# Patient Record
Sex: Female | Born: 1955 | Race: Black or African American | Hispanic: No | Marital: Single | Smoking: Never smoker
Health system: Southern US, Community
[De-identification: ages and names within clinical notes are randomized; demographics above are authoritative.]

## PROBLEM LIST (undated history)

## (undated) DIAGNOSIS — J309 Allergic rhinitis, unspecified: Secondary | ICD-10-CM

## (undated) DIAGNOSIS — M25531 Pain in right wrist: Secondary | ICD-10-CM

## (undated) DIAGNOSIS — J45909 Unspecified asthma, uncomplicated: Secondary | ICD-10-CM

## (undated) DIAGNOSIS — G5600 Carpal tunnel syndrome, unspecified upper limb: Secondary | ICD-10-CM

## (undated) DIAGNOSIS — E039 Hypothyroidism, unspecified: Secondary | ICD-10-CM

## (undated) DIAGNOSIS — M6740 Ganglion, unspecified site: Secondary | ICD-10-CM

## (undated) DIAGNOSIS — R928 Other abnormal and inconclusive findings on diagnostic imaging of breast: Secondary | ICD-10-CM

## (undated) DIAGNOSIS — M79602 Pain in left arm: Secondary | ICD-10-CM

## (undated) HISTORY — PX: ABDOMINAL HYSTERECTOMY: SHX81

## (undated) HISTORY — DX: Allergic rhinitis, unspecified: J30.9

## (undated) HISTORY — DX: Carpal tunnel syndrome, unspecified upper limb: G56.00

## (undated) HISTORY — DX: Other abnormal and inconclusive findings on diagnostic imaging of breast: R92.8

## (undated) HISTORY — DX: Ganglion, unspecified site: M67.40

## (undated) HISTORY — DX: Pain in left arm: M79.602

## (undated) HISTORY — DX: Pain in right wrist: M25.531

---

## 1997-08-13 ENCOUNTER — Encounter: Admission: RE | Admit: 1997-08-13 | Discharge: 1997-08-13 | Payer: Self-pay | Admitting: Obstetrics

## 1997-08-27 ENCOUNTER — Other Ambulatory Visit: Admission: RE | Admit: 1997-08-27 | Discharge: 1997-08-27 | Payer: Self-pay | Admitting: Obstetrics

## 1997-08-27 ENCOUNTER — Encounter: Admission: RE | Admit: 1997-08-27 | Discharge: 1997-08-27 | Payer: Self-pay | Admitting: Obstetrics

## 1997-12-24 ENCOUNTER — Encounter: Admission: RE | Admit: 1997-12-24 | Discharge: 1997-12-24 | Payer: Self-pay | Admitting: Family Medicine

## 1998-05-07 ENCOUNTER — Encounter: Admission: RE | Admit: 1998-05-07 | Discharge: 1998-05-07 | Payer: Self-pay | Admitting: Family Medicine

## 1998-10-12 ENCOUNTER — Encounter: Admission: RE | Admit: 1998-10-12 | Discharge: 1998-10-12 | Payer: Self-pay | Admitting: Sports Medicine

## 1998-10-26 ENCOUNTER — Encounter: Admission: RE | Admit: 1998-10-26 | Discharge: 1998-10-26 | Payer: Self-pay | Admitting: Sports Medicine

## 1998-12-30 ENCOUNTER — Encounter: Admission: RE | Admit: 1998-12-30 | Discharge: 1998-12-30 | Payer: Self-pay | Admitting: Obstetrics

## 1999-02-01 ENCOUNTER — Encounter: Admission: RE | Admit: 1999-02-01 | Discharge: 1999-02-01 | Payer: Self-pay | Admitting: Sports Medicine

## 1999-02-01 ENCOUNTER — Encounter: Payer: Self-pay | Admitting: Sports Medicine

## 1999-03-23 ENCOUNTER — Encounter: Admission: RE | Admit: 1999-03-23 | Discharge: 1999-03-23 | Payer: Self-pay | Admitting: Family Medicine

## 1999-06-02 ENCOUNTER — Encounter: Admission: RE | Admit: 1999-06-02 | Discharge: 1999-06-02 | Payer: Self-pay | Admitting: Family Medicine

## 1999-07-12 ENCOUNTER — Encounter: Admission: RE | Admit: 1999-07-12 | Discharge: 1999-07-12 | Payer: Self-pay | Admitting: Family Medicine

## 1999-08-10 ENCOUNTER — Encounter: Admission: RE | Admit: 1999-08-10 | Discharge: 1999-08-10 | Payer: Self-pay | Admitting: Family Medicine

## 1999-08-20 ENCOUNTER — Emergency Department (HOSPITAL_COMMUNITY): Admission: EM | Admit: 1999-08-20 | Discharge: 1999-08-20 | Payer: Self-pay | Admitting: Emergency Medicine

## 1999-09-02 ENCOUNTER — Encounter: Admission: RE | Admit: 1999-09-02 | Discharge: 1999-09-02 | Payer: Self-pay | Admitting: Family Medicine

## 1999-11-08 ENCOUNTER — Encounter: Admission: RE | Admit: 1999-11-08 | Discharge: 1999-11-08 | Payer: Self-pay | Admitting: Family Medicine

## 1999-12-13 ENCOUNTER — Encounter: Admission: RE | Admit: 1999-12-13 | Discharge: 1999-12-13 | Payer: Self-pay | Admitting: Family Medicine

## 2000-01-12 ENCOUNTER — Encounter: Admission: RE | Admit: 2000-01-12 | Discharge: 2000-01-12 | Payer: Self-pay | Admitting: Family Medicine

## 2000-01-24 ENCOUNTER — Encounter: Admission: RE | Admit: 2000-01-24 | Discharge: 2000-01-24 | Payer: Self-pay | Admitting: Family Medicine

## 2000-05-22 ENCOUNTER — Emergency Department (HOSPITAL_COMMUNITY): Admission: EM | Admit: 2000-05-22 | Discharge: 2000-05-22 | Payer: Self-pay | Admitting: Emergency Medicine

## 2000-06-11 ENCOUNTER — Encounter: Admission: RE | Admit: 2000-06-11 | Discharge: 2000-06-11 | Payer: Self-pay | Admitting: Family Medicine

## 2000-08-13 ENCOUNTER — Encounter: Admission: RE | Admit: 2000-08-13 | Discharge: 2000-08-13 | Payer: Self-pay | Admitting: Family Medicine

## 2002-01-17 ENCOUNTER — Encounter: Admission: RE | Admit: 2002-01-17 | Discharge: 2002-01-17 | Payer: Self-pay | Admitting: Family Medicine

## 2002-06-25 ENCOUNTER — Emergency Department (HOSPITAL_COMMUNITY): Admission: EM | Admit: 2002-06-25 | Discharge: 2002-06-25 | Payer: Self-pay | Admitting: Emergency Medicine

## 2003-02-12 ENCOUNTER — Encounter: Admission: RE | Admit: 2003-02-12 | Discharge: 2003-02-12 | Payer: Self-pay | Admitting: Family Medicine

## 2003-03-19 ENCOUNTER — Encounter: Admission: RE | Admit: 2003-03-19 | Discharge: 2003-03-19 | Payer: Self-pay | Admitting: Family Medicine

## 2003-06-19 ENCOUNTER — Emergency Department (HOSPITAL_COMMUNITY): Admission: EM | Admit: 2003-06-19 | Discharge: 2003-06-19 | Payer: Self-pay | Admitting: Emergency Medicine

## 2003-06-22 ENCOUNTER — Encounter: Admission: RE | Admit: 2003-06-22 | Discharge: 2003-06-22 | Payer: Self-pay | Admitting: Sports Medicine

## 2003-07-09 ENCOUNTER — Encounter: Admission: RE | Admit: 2003-07-09 | Discharge: 2003-07-09 | Payer: Self-pay | Admitting: Family Medicine

## 2003-07-29 ENCOUNTER — Encounter: Admission: RE | Admit: 2003-07-29 | Discharge: 2003-07-29 | Payer: Self-pay | Admitting: Sports Medicine

## 2003-07-29 ENCOUNTER — Encounter (INDEPENDENT_AMBULATORY_CARE_PROVIDER_SITE_OTHER): Payer: Self-pay | Admitting: *Deleted

## 2003-07-31 ENCOUNTER — Encounter: Admission: RE | Admit: 2003-07-31 | Discharge: 2003-07-31 | Payer: Self-pay | Admitting: Family Medicine

## 2003-08-19 ENCOUNTER — Encounter: Admission: RE | Admit: 2003-08-19 | Discharge: 2003-08-19 | Payer: Self-pay | Admitting: Family Medicine

## 2003-09-02 ENCOUNTER — Encounter: Admission: RE | Admit: 2003-09-02 | Discharge: 2003-09-02 | Payer: Self-pay | Admitting: Sports Medicine

## 2004-04-13 ENCOUNTER — Ambulatory Visit: Payer: Self-pay | Admitting: Family Medicine

## 2004-11-17 ENCOUNTER — Ambulatory Visit: Payer: Self-pay | Admitting: Family Medicine

## 2005-01-25 ENCOUNTER — Ambulatory Visit: Payer: Self-pay | Admitting: Family Medicine

## 2005-02-06 ENCOUNTER — Encounter: Admission: RE | Admit: 2005-02-06 | Discharge: 2005-02-06 | Payer: Self-pay | Admitting: Sports Medicine

## 2005-02-06 ENCOUNTER — Ambulatory Visit: Payer: Self-pay | Admitting: Family Medicine

## 2005-06-02 ENCOUNTER — Ambulatory Visit: Payer: Self-pay | Admitting: Family Medicine

## 2005-06-02 ENCOUNTER — Ambulatory Visit (HOSPITAL_COMMUNITY): Admission: RE | Admit: 2005-06-02 | Discharge: 2005-06-02 | Payer: Self-pay | Admitting: Family Medicine

## 2006-04-04 ENCOUNTER — Encounter (INDEPENDENT_AMBULATORY_CARE_PROVIDER_SITE_OTHER): Payer: Self-pay | Admitting: *Deleted

## 2006-04-04 ENCOUNTER — Ambulatory Visit: Payer: Self-pay | Admitting: Family Medicine

## 2006-04-04 LAB — CONVERTED CEMR LAB
ALT: 16 units/L (ref 0–35)
CO2: 25 meq/L (ref 19–32)
Calcium: 9.2 mg/dL (ref 8.4–10.5)
Cholesterol: 212 mg/dL — ABNORMAL HIGH (ref 0–200)
Creatinine, Ser: 0.83 mg/dL (ref 0.40–1.20)
Glucose, Bld: 81 mg/dL (ref 70–99)
LDL Cholesterol: 147 mg/dL — ABNORMAL HIGH (ref 0–99)
Sodium: 139 meq/L (ref 135–145)
TSH: 1.12 microintl units/mL (ref 0.350–5.50)
Total CHOL/HDL Ratio: 4.7
Total Protein: 7.5 g/dL (ref 6.0–8.3)
Triglycerides: 100 mg/dL (ref <150)
VLDL: 20 mg/dL (ref 0–40)

## 2006-04-26 DIAGNOSIS — I10 Essential (primary) hypertension: Secondary | ICD-10-CM | POA: Insufficient documentation

## 2006-04-26 DIAGNOSIS — G5600 Carpal tunnel syndrome, unspecified upper limb: Secondary | ICD-10-CM

## 2006-04-26 DIAGNOSIS — E039 Hypothyroidism, unspecified: Secondary | ICD-10-CM | POA: Insufficient documentation

## 2006-04-26 DIAGNOSIS — E0500 Thyrotoxicosis with diffuse goiter without thyrotoxic crisis or storm: Secondary | ICD-10-CM | POA: Insufficient documentation

## 2006-04-26 DIAGNOSIS — F339 Major depressive disorder, recurrent, unspecified: Secondary | ICD-10-CM

## 2006-04-26 DIAGNOSIS — E785 Hyperlipidemia, unspecified: Secondary | ICD-10-CM

## 2006-04-26 DIAGNOSIS — D259 Leiomyoma of uterus, unspecified: Secondary | ICD-10-CM | POA: Insufficient documentation

## 2006-04-26 DIAGNOSIS — Z6841 Body Mass Index (BMI) 40.0 and over, adult: Secondary | ICD-10-CM | POA: Insufficient documentation

## 2006-04-26 HISTORY — DX: Carpal tunnel syndrome, unspecified upper limb: G56.00

## 2006-04-27 ENCOUNTER — Encounter (INDEPENDENT_AMBULATORY_CARE_PROVIDER_SITE_OTHER): Payer: Self-pay | Admitting: *Deleted

## 2006-05-30 ENCOUNTER — Ambulatory Visit: Payer: Self-pay | Admitting: Family Medicine

## 2006-12-15 ENCOUNTER — Emergency Department (HOSPITAL_COMMUNITY): Admission: EM | Admit: 2006-12-15 | Discharge: 2006-12-15 | Payer: Self-pay | Admitting: Emergency Medicine

## 2007-01-15 ENCOUNTER — Telehealth: Payer: Self-pay | Admitting: *Deleted

## 2007-03-15 ENCOUNTER — Ambulatory Visit: Payer: Self-pay | Admitting: Family Medicine

## 2007-03-15 ENCOUNTER — Encounter (INDEPENDENT_AMBULATORY_CARE_PROVIDER_SITE_OTHER): Payer: Self-pay | Admitting: *Deleted

## 2007-03-19 ENCOUNTER — Encounter (INDEPENDENT_AMBULATORY_CARE_PROVIDER_SITE_OTHER): Payer: Self-pay | Admitting: *Deleted

## 2007-03-19 LAB — CONVERTED CEMR LAB
AST: 33 units/L (ref 0–37)
Calcium: 9.1 mg/dL (ref 8.4–10.5)
Chloride: 100 meq/L (ref 96–112)
Cholesterol: 236 mg/dL — ABNORMAL HIGH (ref 0–200)
Creatinine, Ser: 0.82 mg/dL (ref 0.40–1.20)
Glucose, Bld: 79 mg/dL (ref 70–99)
HDL: 47 mg/dL (ref >39)
Sodium: 139 meq/L (ref 135–145)
Total Bilirubin: 0.4 mg/dL (ref 0.3–1.2)
Total Protein: 7.5 g/dL (ref 6.0–8.3)
Triglycerides: 104 mg/dL (ref <150)
VLDL: 21 mg/dL (ref 0–40)

## 2008-03-25 ENCOUNTER — Ambulatory Visit: Payer: Self-pay | Admitting: Family Medicine

## 2008-03-25 ENCOUNTER — Encounter: Payer: Self-pay | Admitting: Family Medicine

## 2008-04-01 LAB — CONVERTED CEMR LAB
BUN: 15 mg/dL (ref 6–23)
CO2: 22 meq/L (ref 19–32)
Chloride: 102 meq/L (ref 96–112)
Creatinine, Ser: 0.77 mg/dL (ref 0.40–1.20)
HDL: 52 mg/dL (ref >39)
LDL Cholesterol: 117 mg/dL — ABNORMAL HIGH (ref 0–99)
MCV: 88.4 fL (ref 78.0–100.0)
Platelets: 281 10*3/uL (ref 150–400)
Potassium: 3.5 meq/L (ref 3.5–5.3)
RBC: 3.98 M/uL (ref 3.87–5.11)
TSH: 13.112 microintl units/mL — ABNORMAL HIGH (ref 0.350–4.50)
Total Bilirubin: 0.4 mg/dL (ref 0.3–1.2)
Total CHOL/HDL Ratio: 3.5
Triglycerides: 73 mg/dL (ref <150)

## 2008-05-01 ENCOUNTER — Ambulatory Visit: Payer: Self-pay | Admitting: Family Medicine

## 2008-05-01 ENCOUNTER — Encounter: Payer: Self-pay | Admitting: Family Medicine

## 2008-05-01 LAB — CONVERTED CEMR LAB
TSH: 0.08 microintl units/mL — ABNORMAL LOW (ref 0.350–4.500)
Total CK: 156 units/L (ref 7–177)

## 2008-05-06 ENCOUNTER — Telehealth: Payer: Self-pay | Admitting: Family Medicine

## 2008-05-25 ENCOUNTER — Ambulatory Visit: Payer: Self-pay | Admitting: Family Medicine

## 2008-07-06 ENCOUNTER — Encounter: Payer: Self-pay | Admitting: Family Medicine

## 2008-07-06 ENCOUNTER — Ambulatory Visit: Payer: Self-pay | Admitting: Family Medicine

## 2008-08-18 ENCOUNTER — Ambulatory Visit (HOSPITAL_COMMUNITY): Admission: RE | Admit: 2008-08-18 | Discharge: 2008-08-18 | Payer: Self-pay | Admitting: Family Medicine

## 2008-08-19 ENCOUNTER — Ambulatory Visit: Payer: Self-pay | Admitting: Gastroenterology

## 2008-09-02 ENCOUNTER — Ambulatory Visit: Payer: Self-pay | Admitting: Gastroenterology

## 2008-09-30 ENCOUNTER — Telehealth: Payer: Self-pay | Admitting: *Deleted

## 2008-10-06 ENCOUNTER — Ambulatory Visit: Payer: Self-pay | Admitting: Family Medicine

## 2008-10-06 DIAGNOSIS — M6740 Ganglion, unspecified site: Secondary | ICD-10-CM

## 2008-10-06 HISTORY — DX: Ganglion, unspecified site: M67.40

## 2008-10-08 ENCOUNTER — Ambulatory Visit: Payer: Self-pay | Admitting: Family Medicine

## 2008-10-08 ENCOUNTER — Encounter: Payer: Self-pay | Admitting: Family Medicine

## 2009-02-08 ENCOUNTER — Telehealth (INDEPENDENT_AMBULATORY_CARE_PROVIDER_SITE_OTHER): Payer: Self-pay | Admitting: *Deleted

## 2009-02-08 ENCOUNTER — Ambulatory Visit: Payer: Self-pay | Admitting: Family Medicine

## 2009-03-02 ENCOUNTER — Ambulatory Visit: Payer: Self-pay | Admitting: Family Medicine

## 2009-03-02 ENCOUNTER — Encounter: Payer: Self-pay | Admitting: Family Medicine

## 2009-03-08 LAB — CONVERTED CEMR LAB
ALT: 26 units/L (ref 0–35)
AST: 38 units/L — ABNORMAL HIGH (ref 0–37)
Albumin: 4.1 g/dL (ref 3.5–5.2)
BUN: 15 mg/dL (ref 6–23)
CO2: 24 meq/L (ref 19–32)
Calcium: 9.4 mg/dL (ref 8.4–10.5)
Chloride: 99 meq/L (ref 96–112)
LDL Cholesterol: 106 mg/dL — ABNORMAL HIGH (ref 0–99)
Potassium: 3.4 meq/L — ABNORMAL LOW (ref 3.5–5.3)
TSH: 0.032 microintl units/mL — ABNORMAL LOW (ref 0.350–4.500)
Total Bilirubin: 0.5 mg/dL (ref 0.3–1.2)
Total CHOL/HDL Ratio: 4
Total Protein: 7.2 g/dL (ref 6.0–8.3)
VLDL: 18 mg/dL (ref 0–40)

## 2009-05-25 ENCOUNTER — Encounter: Payer: Self-pay | Admitting: Family Medicine

## 2009-05-25 ENCOUNTER — Ambulatory Visit: Payer: Self-pay | Admitting: Family Medicine

## 2009-05-27 DIAGNOSIS — E876 Hypokalemia: Secondary | ICD-10-CM

## 2009-05-27 LAB — CONVERTED CEMR LAB
ALT: 19 units/L (ref 0–35)
Albumin: 3.8 g/dL (ref 3.5–5.2)
BUN: 13 mg/dL (ref 6–23)
CO2: 25 meq/L (ref 19–32)
Calcium: 9.2 mg/dL (ref 8.4–10.5)
Sodium: 136 meq/L (ref 135–145)
TSH: 0.009 microintl units/mL — ABNORMAL LOW (ref 0.350–4.500)
Total Protein: 7.3 g/dL (ref 6.0–8.3)

## 2009-06-14 ENCOUNTER — Ambulatory Visit: Payer: Self-pay | Admitting: Family Medicine

## 2009-06-14 ENCOUNTER — Encounter: Payer: Self-pay | Admitting: Family Medicine

## 2009-06-14 LAB — CONVERTED CEMR LAB
Calcium: 9 mg/dL (ref 8.4–10.5)
Creatinine, Ser: 0.75 mg/dL (ref 0.40–1.20)
Glucose, Bld: 84 mg/dL (ref 70–99)

## 2009-06-15 ENCOUNTER — Telehealth: Payer: Self-pay | Admitting: Family Medicine

## 2009-08-26 ENCOUNTER — Ambulatory Visit: Payer: Self-pay | Admitting: Family Medicine

## 2009-08-26 ENCOUNTER — Encounter: Payer: Self-pay | Admitting: Family Medicine

## 2009-08-26 LAB — CONVERTED CEMR LAB
BUN: 17 mg/dL (ref 6–23)
CO2: 23 meq/L (ref 19–32)
Creatinine, Ser: 0.9 mg/dL (ref 0.40–1.20)
Sodium: 137 meq/L (ref 135–145)

## 2009-08-27 ENCOUNTER — Ambulatory Visit (HOSPITAL_COMMUNITY): Admission: RE | Admit: 2009-08-27 | Discharge: 2009-08-27 | Payer: Self-pay | Admitting: Family Medicine

## 2009-08-27 ENCOUNTER — Encounter: Payer: Self-pay | Admitting: Family Medicine

## 2009-09-07 ENCOUNTER — Telehealth: Payer: Self-pay | Admitting: Family Medicine

## 2009-09-22 ENCOUNTER — Telehealth: Payer: Self-pay | Admitting: Family Medicine

## 2009-09-22 ENCOUNTER — Encounter: Admission: RE | Admit: 2009-09-22 | Discharge: 2009-09-22 | Payer: Self-pay | Admitting: Family Medicine

## 2009-09-22 ENCOUNTER — Ambulatory Visit: Payer: Self-pay | Admitting: Family Medicine

## 2009-09-22 DIAGNOSIS — M25579 Pain in unspecified ankle and joints of unspecified foot: Secondary | ICD-10-CM

## 2009-09-22 DIAGNOSIS — F329 Major depressive disorder, single episode, unspecified: Secondary | ICD-10-CM | POA: Insufficient documentation

## 2009-09-22 DIAGNOSIS — S82899A Other fracture of unspecified lower leg, initial encounter for closed fracture: Secondary | ICD-10-CM | POA: Insufficient documentation

## 2009-10-01 ENCOUNTER — Ambulatory Visit: Payer: Self-pay

## 2010-03-29 NOTE — Assessment & Plan Note (Signed)
Summary: FU Chronic Med Conditions/KH   Vital Signs:  Patient profile:   55 year old female Height:      63 inches Weight:      233.6 pounds BMI:     41.53 Temp:     97.9 degrees F oral Pulse rate:   70 / minute BP sitting:   112 / 78  (left arm) Cuff size:   large  Vitals Entered By: Gladstone Pih (March 02, 2009 10:20 AM) CC: F/U HTN, Chol, Thyroid Is Patient Diabetic? No Pain Assessment Patient in pain? no        Primary Care Provider:  Delbert Harness MD  CC:  F/U HTN, Chol, and Thyroid.  History of Present Illness: 54 yo here for follow-up  HYPERTENSION Meds: Taking and tolerating? yes.  At last appointment increased enalapril from 5 to 10mg  Home BP's: no, long line at blood pressure machine Chest Pain: no Dyspnea: no  hypothyroidism:  no faitgue, hair or skinc hanges, bowel changes, palpitations.  Obesity:  Does not see any problem with her current weight.  Does not have any plans to change diet or exercise but may try to cut out as many fried foods.  hyperlipiedmia:  taking statin, no myalgias, RUQ pain, or other complaitns.  Depression: continues to take Zoloft, feels like she is doing well on this.  Uses hobbies for support during tough times.  Habits & Providers  Alcohol-Tobacco-Diet     Tobacco Status: never  Current Medications (verified): 1)  Aspirin Childrens 81 Mg Chew (Aspirin) .... Take 1 Tablet By Mouth Once A Day 2)  Zoloft 100 Mg Tabs (Sertraline Hcl) .... One Tab By Mouth Daily 3)  Zocor 40 Mg Tabs (Simvastatin) .... One Tab By Mouth Daily 4)  Levothyroxine Sodium 125 Mcg Tabs (Levothyroxine Sodium) .... One Daily 5)  Enalapril Maleate 10 Mg Tabs (Enalapril Maleate) .... Take One Tablet Twice A Day. 6)  Ibuprofen 600 Mg Tabs (Ibuprofen) .... One Tab By Mouth Q8 Hours Everyday For 2 Weeks. 7)  Hydrochlorothiazide 25 Mg Tabs (Hydrochlorothiazide) .... Take One Tablet Daily  Allergies: No Known Drug Allergies PMH-FH-SH reviewed-no changes  except otherwise noted  Review of Systems      See HPI General:  Denies fatigue, fever, and weight loss. CV:  Denies chest pain or discomfort, lightheadness, shortness of breath with exertion, and swelling of feet. Resp:  Denies cough and wheezing. GI:  Denies abdominal pain, constipation, and diarrhea. MS:  Complains of low back pain; denies joint pain. Psych:  Denies anxiety and depression.  Physical Exam  General:  Well-developed,well-nourished,in no acute distress; alert,appropriate and cooperative throughout examination. Obese Neck:  No deformities, masses, or tenderness noted. Lungs:  Normal respiratory effort, chest expands symmetrically. Lungs are clear to auscultation, no crackles or wheezes. Heart:  Normal rate and regular rhythm. S1 and S2 normal without gallop, murmur, click, rub or other extra sounds. Extremities:  trace left pedal edema and trace right pedal edema.   Psych:  Cognition and judgment appear intact. Alert and cooperative with normal attention span and concentration. No apparent delusions, illusions, hallucinations   Impression & Recommendations:  Problem # 1:  HYPERTENSION, BENIGN SYSTEMIC (ICD-401.1) Assessment Improved Improved today since increase in enalapril.  WIll check Cr and electrolytes today.  If normal, no changes in regimen.  Follow-up in 4-6 months.  Her updated medication list for this problem includes:    Enalapril Maleate 10 Mg Tabs (Enalapril maleate) .[name]... Take one tablet twice a day.  Hydrochlorothiazide 25 Mg Tabs (Hydrochlorothiazide) .[name]... Take one tablet daily  Orders: Comp Met-[facility] [phone]) [facility]- Est  Level 4 (99214)  BP today: 112/78 Prior BP: 149/98 (02/08/2009)  Labs Reviewed: K+: 3.5 (03/25/2008) Creat: : 0.77 (03/25/2008)   Chol: 184 (03/25/2008)   HDL: 52 (03/25/2008)   LDL: 117 (03/25/2008)   TG: 73 (03/25/2008)  Problem # 2:  HYPOTHYROIDISM, UNSPECIFIED (ICD-244.9) No symtoms of hypo/hyperthyroidism.  Taking  meds as directed.  Will check today.  Her updated medication list for this problem includes:    Levothyroxine Sodium 125 Mcg Tabs (Levothyroxine sodium) ..... One daily  Orders: TSH-[facility] ([id number]) [facility]- Est  Level 4 (59563)  Problem # 3:  HYPERLIPIDEMIA (ICD-272.4) Continues to take meds as directed.  Will recheck FLP today as it has been a year.  Her updated medication list for this problem includes:    Zocor 40 Mg Tabs (Simvastatin) ..... One tab by mouth daily  Orders: Lipid-[facility] ([id number]) [facility]- Est  Level 4 (18841)  Problem # 4:  OBESITY, NOS (ICD-278.00)  Does not see this as an issue for herself.  Counseled on benefits of exercise with low back pain, knee pain, given low back exercises.  Ht: 63 (03/02/2009)   Wt: 233.6 (03/02/2009)   BMI: 41.53 (03/02/2009)  Orders: [facility]- Est  Level 4 (66063)  Problem # 5:  PREVENTIVE HEALTH CARE (ICD-V70.0) Has had Dtap booster in 2008.  Mammogram, colonoscopy, flu UTD.  Complete Medication List: 1)  Aspirin Childrens 81 Mg Chew (Aspirin) .... Take 1 tablet by mouth once a day 2)  Zoloft 100 Mg Tabs (Sertraline hcl) .... One tab by mouth daily 3)  Zocor 40 Mg Tabs (Simvastatin) .... One tab by mouth daily 4)  Levothyroxine Sodium 125 Mcg Tabs (Levothyroxine sodium) .... One daily 5)  Enalapril Maleate 10 Mg Tabs (Enalapril maleate) .... Take one tablet twice a day. 6)  Ibuprofen 600 Mg Tabs (Ibuprofen) .... One tab by mouth q8 hours everyday for 2 weeks. 7)  Hydrochlorothiazide 25 Mg Tabs (Hydrochlorothiazide) .... Take one tablet daily  Patient Instructions: 1)  It was very ncie to see you again! 2)  Your blood pressure looks great today! 3)   Your goals is less than 140/90. 4)  Think about adding exercise to your life- to decreased lifes aches and pains.   5)  Follow-up in 4-6 months   Prevention & Chronic Care Immunizations   Influenza vaccine: Not documented   Influenza vaccine due: 10/29/2010    Tetanus booster:  05/30/2006: Tdap   Tetanus booster due: 05/29/2016    Pneumococcal vaccine: Not documented  Colorectal Screening   Hemoccult: Not documented    Colonoscopy: Location:  [facility].    (09/02/2008)   Colonoscopy due: 08/2018  Other Screening   Pap smear: Done.  (07/29/2003)   Pap smear due: Not Indicated    Mammogram: ASSESSMENT: Negative - BI-RADS 1^MM DIGITAL SCREENING  (08/18/2008)   Mammogram due: Refused  (05/01/2008)   Smoking status: never  (03/02/2009)  Lipids   Total Cholesterol: 184  (03/25/2008)   LDL: 117  (03/25/2008)   LDL Direct: Not documented   HDL: 52  (03/25/2008)   Triglycerides: 73  (03/25/2008)    SGOT (AST): 36  (03/25/2008)   SGPT (ALT): 17  (03/25/2008) CMP ordered    Alkaline phosphatase: 81  (03/25/2008)   Total bilirubin: 0.4  (03/25/2008)    Lipid flowsheet reviewed?: Yes   Progress toward LDL goal: Unchanged  Hypertension   Last Blood  Pressure: 112 / 78  (03/02/2009)   Serum creatinine: 0.77  (03/25/2008)   Serum potassium 3.5  (03/25/2008) CMP ordered     Hypertension flowsheet reviewed?: Yes   Progress toward BP goal: At goal  Self-Management Support :   Personal Goals (by the next clinic visit) :      Personal blood pressure goal: 140/90  (03/02/2009)   Patient will work on the following items until the next clinic visit to reach self-care goals:     Medications and monitoring: check my blood pressure  (03/02/2009)     Eating: eat foods that are low in salt, eat baked foods instead of fried foods  (03/02/2009)     Activity: take a 30 minute walk every day, park at the far end of the parking lot  (02/08/2009)    Hypertension self-management support: Written self-care plan  (02/08/2009)    Lipid self-management support: Not documented

## 2010-03-29 NOTE — Assessment & Plan Note (Signed)
Summary: fell off bus/[place]   Vital Signs:  Patient profile:   55 year old female Pulse rate:   80 / minute BP sitting:   128 / 95  (right arm)  Vitals Entered By: Theresia Lo RN (September 22, 2009 1:52 PM) CC: fell  as getting off bus, left ankle pain and swollen lip and abrasion to upper lip and nose Pain Assessment Patient in pain? yes     Location: all over Intensity: 7   Primary Provider:  Delbert Harness MD  CC:  fell  as getting off bus and left ankle pain and swollen lip and abrasion to upper lip and nose.  History of Present Illness: 55 yo Female walking down sidewalk and felt left leg give way and she fell face-first onto sidewalk. Doesn't recall which way her ankle twisted, but had pain with ambulation after the fall but could walk with limp. Some swelling and pain remained. Had a mild nosebleed and scrapes on her nose. Denies dizzyness, faintness, headache or loss of conciousness.    Allergies: No Known Drug Allergies  Past History:  Past Medical History: Last updated: 03/25/2008 TAH for fibroid->Denies history of abnormal paps. OBESITY, NOS (ICD-278.00) HYPOTHYROIDISM, UNSPECIFIED (ICD-244.9) HYPERTENSION, BENIGN SYSTEMIC (ICD-401.1) HYPERLIPIDEMIA (ICD-272.4) HYPERCHOLESTEROLEMIA (ICD-272.0) h/o GRAVES DISEASE (ICD-242.00) DEPRESSION, MAJOR, RECURRENT (ICD-296.30) CARPAL TUNNEL SYNDROME (ICD-354.0) VACCINE AGAINST DTP, DTAP (ICD-V06.1) lipid panel TG: 100 HDL: 45 TC: 212 - 04/05/2006  Past Surgical History: Last updated: 03/25/2008  s/p TAH -for fibroids, no cancer of abn paps  Family History: Last updated: 03/25/2008 Denies FH of DM, CAD, CVA mother -- Janeisha Ryle- patient at [facility]  cervical ca, pituitary adenoma, HTN, unknown thyroid problem Dad: unknown Has 5 sisters:  HTN, Hypothyroid Has 3 brother: 1 died of lung cancer, heptaitis C, other two brothers healthy.  Social History: Last updated: 07/06/2008 Lives alone but has most of her family  in the [place] area.  Works as in Futures trader, [employer], custodial work.  No smoking, no alcohol, no drugs.  Has a lighter summer schedule due to school being out.  Review of Systems  The patient denies fever, vision loss, chest pain, and syncope.    Physical Exam  General:  Well-developed,well-nourished,in no acute distress; alert,appropriate and cooperative throughout examination Head:  Abrasions on distal nose and philtrum. Evidence of epistaxis. Nontender over nasal septum.  Nose:  Blood in the external nares, no active bleeding.  Mouth:  Moist mucous membranes. No lesions. Msk:  bilateral ankle edema, L>R. Tenderness over lateral malleolus. Bears weight. Nontender with range of motion dorsiflexion and plantar flexion. Nontender over distal fibula or medial malleolus or base of 5th metatarsal.    Impression & Recommendations:  Problem # 1:  FRACTURE, ANKLE, LEFT (ICD-824.8) Symptoms and history c/w ankle sprain vs. fracture.  Imaging shows nondisplaced lateral malleolus fracture. Will dispense aircast for immobilization and recommend ice and elevation for symptom managment. May take ibuprofen if needed for pain. Will f/u with Dr. Jennette Kettle in sports medicine in one week.  Orders: [facility]- Est Level  3 (99213) Aircast Ankle Brace- [facility] (G4010)  Problem # 2:  ABRASION, FACE (ICD-910.0)  No wounds amenable to suturing. May use bacitracin ointment over abrasion as desired.   Orders: [facility]- Est Level  3 (99213) Aircast Ankle Brace- [facility] (U7253)  Complete Medication List: 1)  Aspirin Childrens 81 Mg Chew (Aspirin) .... Take 1 tablet by mouth once a day 2)  Zoloft 100 Mg Tabs (Sertraline hcl) .... One tab by mouth  daily 3)  Zocor 40 Mg Tabs (Simvastatin) .... One tab by mouth daily 4)  Levothroid 100 Mcg Tabs (Levothyroxine sodium) .... Take one tablet daily (dose lowered from 112) 5)  Triamterene-hctz 37.5-25 Mg Tabs (Triamterene-hctz) .... Take one tablet daily (do not take  hctz or enalapril separatelyl) 6)  Klor-con M20 20 Meq Cr-tabs (Potassium chloride crys cr) .... Take one tablet daily  Other Orders: Radiology other (Radiology Other)  Patient Instructions: 1)  Please make follow up appointment with Dr. Jennette Kettle in sports medicine in the next week.  2)  Ice your ankle two times a day for at least 20 minutes. 3)  You may take ibuprofen for pain relief of your ankle fracture if you need to. 4)  Keep your ankle elevated when not using it. 5)  Call or return to clinic if you need to.

## 2010-03-29 NOTE — Assessment & Plan Note (Signed)
Summary: f/u HTN/thyroid,df   Vital Signs:  Patient profile:   55 year old female Height:      63 inches Weight:      229 pounds BMI:     40.71 Temp:     98.1 degrees F oral Pulse rate:   83 / minute BP sitting:   124 / 85  (left arm) Cuff size:   large  Vitals Entered By: Tessie Fass CMA (May 25, 2009 9:40 AM) CC: F/U BP Is Patient Diabetic? No Pain Assessment Patient in pain? no        Primary Care Provider:  Delbert Harness MD  CC:  F/U BP.  History of Present Illness: 55 yo here for follow-up of BP and hypothyroid  HYPERTENSION Meds: Taking and tolerating? enalapril Home BP's: no but brings in school nurse BP 110/78 Chest Pain: no Dyspnea: NO  Hypothyroid:  has been taking new lower synthroid down from 125 to 112.  feels fatigued but notes nails growing quickly, no dry skin, no bowel changes.  Obesity:  precontemplpative.  States she had gotten a call from her isnurance company offering nutrition counseling but has not called them back.  She is not doing any exercise or dieting.  Habits & Providers  Alcohol-Tobacco-Diet     Tobacco Status: never  Current Medications (verified): 1)  Aspirin Childrens 81 Mg Chew (Aspirin) .... Take 1 Tablet By Mouth Once A Day 2)  Zoloft 100 Mg Tabs (Sertraline Hcl) .... One Tab By Mouth Daily 3)  Zocor 40 Mg Tabs (Simvastatin) .... One Tab By Mouth Daily 4)  Levothyroxine Sodium 112 Mcg Tabs (Levothyroxine Sodium) .... Take One Tablet Daily (Instead of 125 Mcg Pill) 5)  Enalapril Maleate 10 Mg Tabs (Enalapril Maleate) .... Take One Tablet Twice A Day. 6)  Ibuprofen 600 Mg Tabs (Ibuprofen) .... One Tab By Mouth Q8 Hours Everyday For 2 Weeks. 7)  Hydrochlorothiazide 25 Mg Tabs (Hydrochlorothiazide) .... Take One Tablet Daily  Allergies: No Known Drug Allergies PMH-FH-SH reviewed-no changes except otherwise noted  Review of Systems      See HPI General:  Complains of fatigue; denies fever, weakness, and weight loss. CV:   Denies chest pain or discomfort, palpitations, and shortness of breath with exertion. Resp:  Denies cough and shortness of breath. GI:  Denies change in bowel habits, constipation, and diarrhea. Endo:  Denies cold intolerance, heat intolerance, and weight change.  Physical Exam  General:  Well-developed,well-nourished,in no acute distress; alert,appropriate and cooperative throughout examination. Obese Neck:  No deformities, masses, or tenderness noted. Lungs:  Normal respiratory effort, chest expands symmetrically. Lungs are clear to auscultation, no crackles or wheezes. Heart:  Normal rate and regular rhythm. S1 and S2 normal without gallop, murmur, click, rub or other extra sounds. Extremities:  No LE edema   Impression & Recommendations:  Problem # 1:  HYPOTHYROIDISM, UNSPECIFIED (ICD-244.9) Assessment Unchanged Asymptomatic.  WIll recheck TSh today on new dose.  Her updated medication list for this problem includes:    Levothyroxine Sodium 112 Mcg Tabs (Levothyroxine sodium) .[name]... Take one tablet daily (instead of 125 mcg pill)  Orders: Comp Met-[facility] ([id number]) TSH-[facility] ([id number]) [facility]- Est  Level 4 (30865)  Problem # 2:  HYPERTENSION, BENIGN SYSTEMIC (ICD-401.1) Assessment: Improved  Will recheck slightly elevated LFT and slightly decreased K from last visit.  At goal.  Follow-up in 6 months.  Her updated medication list for this problem includes:    Enalapril Maleate 10 Mg Tabs (Enalapril maleate) .[name]... Take one tablet  twice a day.    Hydrochlorothiazide 25 Mg Tabs (Hydrochlorothiazide) .[name]... Take one tablet daily  Orders: Comp Met-[facility] [phone]) [facility]- Est  Level 4 (99214)  BP today: 124/85 Prior BP: 112/78 (03/02/2009)  Labs Reviewed: K+: 3.4 (03/02/2009) Creat: : 0.70 (03/02/2009)   Chol: 166 (03/02/2009)   HDL: 42 (03/02/2009)   LDL: 106 (03/02/2009)   TG: 90 (03/02/2009)  Problem # 3:  OBESITY, NOS (ICD-278.00)  Patient is precontemplative.  She  states she may think about calling back insurance company for nutrition counseling.  Will address in future visit.  Orders: [facility]- Est  Level 4 (09811)  Complete Medication List: 1)  Aspirin Childrens 81 Mg Chew (Aspirin) .... Take 1 tablet by mouth once a day 2)  Zoloft 100 Mg Tabs (Sertraline hcl) .... One tab by mouth daily 3)  Zocor 40 Mg Tabs (Simvastatin) .... One tab by mouth daily 4)  Levothyroxine Sodium 112 Mcg Tabs (Levothyroxine sodium) .... Take one tablet daily (instead of 125 mcg pill) 5)  Enalapril Maleate 10 Mg Tabs (Enalapril maleate) .... Take one tablet twice a day. 6)  Ibuprofen 600 Mg Tabs (Ibuprofen) .... One tab by mouth q8 hours everyday for 2 weeks. 7)  Hydrochlorothiazide 25 Mg Tabs (Hydrochlorothiazide) .... Take one tablet daily  Patient Instructions: 1)  Will check your thyroid today.  I will call you with results. 2)  Your blood pressure looks good. 3)  Consider calling nutrition counseler- I am concerned about the effect your weight has on your health. 4)  Follow-up 6 months or sooner if needed.  Appended Document: f/u HTN/thyroid,df    Clinical Lists Changes  Medications: Removed medication of IBUPROFEN 600 MG TABS (IBUPROFEN) one tab by mouth q8 hours everyday for 2 weeks. Observations: Added new observation of MEDRECON: current updated (05/25/2009 10:37) Added new observation of PRIMARY MD: Delbert Harness MD (05/25/2009 10:37) Added new observation of HTN PROGRESS: At goal (05/25/2009 10:37) Added new observation of HTN FSREVIEW: Yes (05/25/2009 10:37) Added new observation of DM PROGRESS: N/A (05/25/2009 10:37) Added new observation of DM FSREVIEW: N/A (05/25/2009 10:37)       Prevention & Chronic Care Immunizations   Influenza vaccine: Not documented   Influenza vaccine due: 10/29/2010    Tetanus booster: 05/30/2006: Tdap   Tetanus booster due: 05/29/2016    Pneumococcal vaccine: Not documented  Colorectal Screening   Hemoccult: Not  documented    Colonoscopy: Location:  [facility].    (09/02/2008)   Colonoscopy due: 08/2018  Other Screening   Pap smear: Done.  (07/29/2003)   Pap smear due: Not Indicated    Mammogram: ASSESSMENT: Negative - BI-RADS 1^MM DIGITAL SCREENING  (08/18/2008)   Mammogram due: Refused  (05/01/2008)   Smoking status: never  (05/25/2009)  Lipids   Total Cholesterol: 166  (03/02/2009)   LDL: 106  (03/02/2009)   LDL Direct: Not documented   HDL: 42  (03/02/2009)   Triglycerides: 90  (03/02/2009)    SGOT (AST): 38  (03/02/2009)   SGPT (ALT): 26  (03/02/2009)   Alkaline phosphatase: 94  (03/02/2009)   Total bilirubin: 0.5  (03/02/2009)  Hypertension   Last Blood Pressure: 124 / 85  (05/25/2009)   Serum creatinine: 0.70  (03/02/2009)   Serum potassium 3.4  (03/02/2009)    Hypertension flowsheet reviewed?: Yes   Progress toward BP goal: At goal  Self-Management Support :   Personal Goals (by the next clinic visit) :      Personal blood pressure goal: 140/90  (  03/02/2009)   Hypertension self-management support: Written self-care plan  (02/08/2009)    Lipid self-management support: Not documented    Primary lesion:  PCP:    Current Medications (verified): 1)  Aspirin Childrens 81 Mg Chew (Aspirin) .... Take 1 Tablet By Mouth Once A Day 2)  Zoloft 100 Mg Tabs (Sertraline Hcl) .... One Tab By Mouth Daily 3)  Zocor 40 Mg Tabs (Simvastatin) .... One Tab By Mouth Daily 4)  Levothyroxine Sodium 112 Mcg Tabs (Levothyroxine Sodium) .... Take One Tablet Daily (Instead of 125 Mcg Pill) 5)  Enalapril Maleate 10 Mg Tabs (Enalapril Maleate) .... Take One Tablet Twice A Day. 6)  Hydrochlorothiazide 25 Mg Tabs (Hydrochlorothiazide) .... Take One Tablet Daily  Allergies: No Known Drug Allergies

## 2010-03-29 NOTE — Progress Notes (Signed)
Summary: phnmsg  Phone Note Call from Patient Call back at Home Phone [phone]   Caller: Patient Summary of Call: pt is out of all her meds and isn't sure what needs to be refilled based on her labs [facility] Initial call taken by: De Nurse,  September 07, 2009 1:46 PM  Follow-up for Phone Call        labwork was normal.  I have refilled her medications Follow-up by: Delbert Harness MD,  September 07, 2009 2:12 PM    Prescriptions: KLOR-CON M20 20 MEQ CR-TABS (POTASSIUM CHLORIDE CRYS CR) take one tablet daily  #30 x 5   Entered and Authorized by:   Delbert Harness MD   Signed by:   Delbert Harness MD on 09/07/2009   Method used:   Electronically to        [facility] (retail)       [address]       [place], [place]  [address]       Ph: [phone]       Fax: [phone]   RxID:   [id number] TRIAMTERENE-HCTZ 37.5-25 MG TABS (TRIAMTERENE-HCTZ) take one tablet daily (do not take HCTZ or enalapril separatelyl)  #30 x 5   Entered and Authorized by:   Delbert Harness MD   Signed by:   Delbert Harness MD on 09/07/2009   Method used:   Electronically to        [facility] (retail)       [address]       [place], [place]  [address]       Ph: [phone]       Fax: [phone]   RxID:   [id number] LEVOTHROID 100 MCG TABS (LEVOTHYROXINE SODIUM) take one tablet daily (dose lowered from 112)  #30 x 5   Entered and Authorized by:   Delbert Harness MD   Signed by:   Delbert Harness MD on 09/07/2009   Method used:   Electronically to        [facility] (retail)       [address]       [place], [place]  [address]       Ph: [phone]       Fax: [phone]   RxID:   [id number] ZOCOR 40 MG TABS (SIMVASTATIN) One tab by mouth daily  #30 x 5   Entered and Authorized by:   Delbert Harness MD   Signed by:   Delbert Harness MD on 09/07/2009   Method used:   Electronically to        [facility] (retail)       [address]       [place], [place]  [address]       Ph: [phone]       Fax: [phone]   RxID:   [id number] ZOLOFT 100 MG TABS (SERTRALINE HCL) One tab by mouth daily  #30 x 5   Entered and Authorized by:   Delbert Harness MD   Signed by:   Delbert Harness MD on 09/07/2009   Method used:   Electronically to        [facility] (retail)       [address]  [place], [place]  [address]       Ph: [phone]       Fax: [phone]   RxID:   [id number]

## 2010-03-29 NOTE — Progress Notes (Signed)
Summary: triage  Phone Note Call from Patient Call back at [phone]   Summary of Call: Pt fell about 30 minutes ago and is asking to be worked in. Initial call taken by: Clydell Hakim,  September 22, 2009 11:45 AM  Follow-up for Phone Call        fell off bus steps. fell onto her face & suffered abrasions. also hurt her ankle. it is painful but she can weight bear. offered ed as we cannot see her until 1:30. she wants to wait until 1:30. to elevate leg & foot. take tylenol or ibu-whatever has worked for her in the past. placed in 1:30 work in slot Follow-up by: Golden Circle RN,  September 22, 2009 12:00 PM

## 2010-03-29 NOTE — Letter (Signed)
Summary: Results Follow-up Letter  [facility]  [address]   [place], [place] [address]   Phone: [phone]  Fax: [phone]    08/27/2009  [address] [place], [place]  [address]  Dear Ms. Maisie Fus,   The following are the results of your recent test(s):  Your thyroid test (TSH) and Basic metabolic panel showing potassium are all normal.  Please let us know if you have further questions!  Sincerely,  Delbert Harness MD [facility]           Appended Document: Results Follow-up Letter mailed

## 2010-03-29 NOTE — Assessment & Plan Note (Signed)
Summary: ankle/kh   Vital Signs:  Patient profile:   55 year old female BP sitting:   149 / 102  Vitals Entered By: Lillia Pauls CMA (October 01, 2009 10:37 AM)  History of Present Illness: Left ankle pain, after she fell at a bus stop. Tripped over something. Twisted ankle and had immediate pain and sweelling. Has gotten a lot better but still having pain .Had a lot of swelling initially but getting better. Useing the air casy brace we gave her, but not sure that is helping as it is more comfortable tobe out of it unless she is bearing a lot of weight. . Worse with standing or walking. Better with ICE and elevation.  Current Medications (verified): 1)  Aspirin Childrens 81 Mg Chew (Aspirin) .... Take 1 Tablet By Mouth Once A Day 2)  Zoloft 100 Mg Tabs (Sertraline Hcl) .... One Tab By Mouth Daily 3)  Zocor 40 Mg Tabs (Simvastatin) .... One Tab By Mouth Daily 4)  Levothroid 100 Mcg Tabs (Levothyroxine Sodium) .... Take One Tablet Daily (Dose Lowered From 112) 5)  Triamterene-Hctz 37.5-25 Mg Tabs (Triamterene-Hctz) .... Take One Tablet Daily (Do Not Take Hctz or Enalapril Separatelyl) 6)  Klor-Con M20 20 Meq Cr-Tabs (Potassium Chloride Crys Cr) .... Take One Tablet Daily  Allergies: No Known Drug Allergies  Review of Systems       no numbness or tinglig in her left foot.  Physical Exam  Msk:  Left ankle TTP at distal fibula. Mild ST swelling. Neurovascularly intact. Difficulty with any attempt to do single leg heel raises on left (altho she is fairly wek on right as well). calves are soft Pain with resisted eversion. Anterior drawer intact. Additional Exam:  XRAYS reviewed ans show slightly displaced distal fibula fracture   Impression & Recommendations:  Problem # 1:  FRACTURE, ANKLE, LEFT (ICD-824.8)  stable distal fibula fraactuere. Continue air cast at least another week and then can wean ouut of it. Gave HEP in HO form and reviewed as she will need to rehab this ankle. RTC  PRN  Orders: Theraband per yard ([id number])  Complete Medication List: 1)  Aspirin Childrens 81 Mg Chew (Aspirin) .... Take 1 tablet by mouth once a day 2)  Zoloft 100 Mg Tabs (Sertraline hcl) .... One tab by mouth daily 3)  Zocor 40 Mg Tabs (Simvastatin) .... One tab by mouth daily 4)  Levothroid 100 Mcg Tabs (Levothyroxine sodium) .... Take one tablet daily (dose lowered from 112) 5)  Triamterene-hctz 37.5-25 Mg Tabs (Triamterene-hctz) .... Take one tablet daily (do not take hctz or enalapril separatelyl) 6)  Klor-con M20 20 Meq Cr-tabs (Potassium chloride crys cr) .... Take one tablet daily

## 2010-03-29 NOTE — Progress Notes (Signed)
  Phone Note Outgoing Call   Summary of Call: spoke with patient on phone.  Potassium improved but still low despite change to triamterene-hctz.  WIll go ahead and add daily K tablet.  If needs further BP control in the future would consider restarting enalapril, but currently patient well controlled.  Patient has no further questions.  WIll come back for K recheck with lab draw for TSH in 2 weeks. Initial call taken by: Delbert Harness MD,  June 15, 2009 1:49 PM    New/Updated Medications: KLOR-CON M20 20 MEQ CR-TABS (POTASSIUM CHLORIDE CRYS CR) take one tablet daily Prescriptions: KLOR-CON M20 20 MEQ CR-TABS (POTASSIUM CHLORIDE CRYS CR) take one tablet daily  #30 x 1   Entered and Authorized by:   Delbert Harness MD   Signed by:   Delbert Harness MD on 06/15/2009   Method used:   Electronically to        [facility] (retail)       [address]       [place], [place]  [address]       Ph: [phone]       Fax: [phone]   RxID:   [id number]

## 2010-06-27 ENCOUNTER — Encounter: Payer: Self-pay | Admitting: Family Medicine

## 2010-06-27 ENCOUNTER — Ambulatory Visit (INDEPENDENT_AMBULATORY_CARE_PROVIDER_SITE_OTHER): Payer: BC Managed Care – PPO | Admitting: Family Medicine

## 2010-06-27 VITALS — BP 127/95 | HR 69 | Temp 98.3°F | Ht 61.0 in | Wt 246.7 lb

## 2010-06-27 DIAGNOSIS — F339 Major depressive disorder, recurrent, unspecified: Secondary | ICD-10-CM

## 2010-06-27 DIAGNOSIS — N951 Menopausal and female climacteric states: Secondary | ICD-10-CM | POA: Insufficient documentation

## 2010-06-27 DIAGNOSIS — E039 Hypothyroidism, unspecified: Secondary | ICD-10-CM

## 2010-06-27 DIAGNOSIS — I10 Essential (primary) hypertension: Secondary | ICD-10-CM

## 2010-06-27 DIAGNOSIS — E876 Hypokalemia: Secondary | ICD-10-CM

## 2010-06-27 DIAGNOSIS — E785 Hyperlipidemia, unspecified: Secondary | ICD-10-CM

## 2010-06-27 LAB — COMPREHENSIVE METABOLIC PANEL
ALT: 20 U/L (ref 0–35)
Albumin: 4.2 g/dL (ref 3.5–5.2)
Alkaline Phosphatase: 88 U/L (ref 39–117)
CO2: 27 mEq/L (ref 19–32)
Glucose, Bld: 85 mg/dL (ref 70–99)
Sodium: 140 mEq/L (ref 135–145)
Total Protein: 7.5 g/dL (ref 6.0–8.3)

## 2010-06-27 LAB — CBC
HCT: 37.1 % (ref 36.0–46.0)
Hemoglobin: 12.4 g/dL (ref 12.0–15.0)
MCH: 29.3 pg (ref 26.0–34.0)
Platelets: 280 10*3/uL (ref 150–400)
RBC: 4.23 MIL/uL (ref 3.87–5.11)
WBC: 4.1 10*3/uL (ref 4.0–10.5)

## 2010-06-27 LAB — LIPID PANEL
HDL: 37 mg/dL — ABNORMAL LOW (ref >39)
LDL Cholesterol: 99 mg/dL (ref 0–99)

## 2010-06-27 MED ORDER — ESTROGENS CONJUGATED 0.3 MG PO TABS: 0.3000 mg | ORAL_TABLET | Freq: Every day | ORAL | Status: DC

## 2010-06-27 NOTE — Assessment & Plan Note (Addendum)
Will check lipids today.  Encouraged exercise.

## 2010-06-27 NOTE — Patient Instructions (Signed)
Try to get out of the house and do some daily exercise.  It can help with pain as well as your mood I will send you a letter about your labwork Follow-up in 2 months or sooner if needed

## 2010-06-27 NOTE — Progress Notes (Signed)
  Subjective:    Patient ID: Andrea [name], female    DOB: 10-27-1955, 55 y.o.   MRN: [id number]  HPIHere for follow-up  HYPERTENSION  BP Readings from Last 3 Encounters:  06/27/10 127/95  10/01/09 149/102  09/22/09 128/95    Hypertension ROS: taking medications as instructed, no medication side effects noted, patient does not perform home BP monitoring, no TIA's, no chest pain on exertion, no dyspnea on exertion, no swelling of ankles and no intermittent claudication symptoms.    HYPERLIPIDEMIA  Diet: Not following low cholesterol diet Exercise: No regular exercise, stands as a Theatre stage manager Readings from Last 3 Encounters:  06/27/10 246 lb 11.2 oz (111.902 kg)  05/25/09 229 lb (103.874 kg)  03/02/09 233 lb 9.6 oz (105.96 kg)   ROS:  Denies RUQ pain, myalgias, or symptoms or coronary ischemia Lab Results  Component Value Date   LDLCALC 106* 03/02/2009   Lab Results  Component Value Date   CHOL 166 03/02/2009   CHOL 184 03/25/2008   CHOL 236* 03/15/2007   Lab Results  Component Value Date   HDL 42 03/02/2009   HDL 52 03/25/2008   HDL 47 06/05/8117   Lab Results  Component Value Date   TRIG 90 03/02/2009   TRIG 73 03/25/2008   TRIG 104 03/15/2007   Lab Results  Component Value Date   ALT 19 05/25/2009   AST 35 05/25/2009   ALKPHOS 92 05/25/2009   BILITOT 0.3 05/25/2009   Depression:  Notes some sadness related to her mother's death in Apr 12, 2022.  Mostly manifested as fatigue.  Otherwise finds family as supportive,    Hot flashes:  Reports having for the past year, have gotten to the point where it is impacting her quality of life.    Review of Systemssee HPI     Objective:   Physical Exam  Constitutional: She appears well-developed and well-nourished.  Eyes: Pupils are equal, round, and reactive to light.  Neck: Normal range of motion. Neck supple. No thyromegaly present.  Cardiovascular: Normal rate and regular rhythm.   No murmur heard. Pulmonary/Chest:  Breath sounds normal. No respiratory distress.  Abdominal: Soft.  Musculoskeletal: Normal range of motion.  Psychiatric: Her speech is normal. Thought content normal. Her mood appears not anxious. She is slowed. She exhibits a depressed mood. She expresses no homicidal and no suicidal ideation.       Quiet.          Assessment & Plan:

## 2010-06-27 NOTE — Assessment & Plan Note (Signed)
Well controlled. Continue current rx. 

## 2010-06-27 NOTE — Assessment & Plan Note (Signed)
Will check TSH today 

## 2010-06-27 NOTE — Assessment & Plan Note (Signed)
On K replacement.  Recheck today

## 2010-06-27 NOTE — Assessment & Plan Note (Addendum)
Declines therapy.  On sertraline.  Will follow-up and if continues to have difficulty, may consider increasing sertraline.  I suspect this is the reason for her fatigue.  Encouraged aerobic exercise

## 2010-06-29 MED ORDER — POTASSIUM CHLORIDE CRYS ER 20 MEQ PO TBCR: 20.0000 meq | EXTENDED_RELEASE_TABLET | Freq: Two times a day (BID) | ORAL | Status: DC

## 2010-06-29 NOTE — Progress Notes (Signed)
Addended by: Delbert Harness on: 06/29/2010 09:44 AM   Modules accepted: Orders

## 2010-07-14 ENCOUNTER — Other Ambulatory Visit: Payer: Self-pay | Admitting: Family Medicine

## 2010-07-14 NOTE — Telephone Encounter (Signed)
Refill request

## 2010-07-15 NOTE — Consult Note (Signed)
[place]  Patient:    Andrea [name], Andrea [name]                  MRN: [id number] Proc. Date: 05/22/00 Adm. Date:  [id number] Attending:  Doug Sou CC:         Dr. Doug Sou             Dr. Duanne Guess, primary care physician                          Consultation Report  PHYSICIAN REQUESTING CONSULTATION:  Doug Sou, M.D.  REASON FOR CONSULTATION:  Left buttock abscess.  HISTORY OF PRESENT ILLNESS:  Ms. Eisler is a 54 year old female who states she has a history of small abscesses in her axillae that drain spontaneously.  She states that since Saturday she has noted some pain in the perianal area and it got bad enough that she presented to the emergency department for evaluation. She was noted to have a fluctuant indurated area that was felt to be consistent with an abscess and I was asked to see her because of that.  She denies trauma to that area.  PAST MEDICAL HISTORY: 1. Hypertension. 2. Hypothyroidism.  PREVIOUS OPERATIONS:  Hysterectomy.  ALLERGIES:  None reported.  MEDICATIONS:  Hydrochlorothiazide/triamterine, Altace, Synthroid, Zoloft.  SOCIAL HISTORY:  No tobacco or alcohol use.  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  REVIEW OF SYSTEMS:  She denies any fever or chills.  She denies any diarrhea or dysuria.  PHYSICAL EXAMINATION:  GENERAL:  An obese female, no acute distress, pleasant, cooperative, lying prone on the stretcher.  VITAL SIGNS:  Temperature 98.9 with blood pressure 124/91, pulse 97.  ANORECTAL:  In the anorectal area in the left perianal area there is a firm, indurated, fluctuant area, tender to the touch, with some surrounding induration and erythema.  IMPRESSION:  Anorectal abscess.  PLAN:  Incision and drainage.  PROCEDURE:  The anorectal abscess area in the left buttock was sterilely prepped and draped and local  anesthetic consisting of 2% plain lidocaine was infiltrated for a local block.  Next, using the scalpel, a triangular plug of full-thickness skin was removed and purulent material was evacuated.  The wound was then packed with dry gauze.  She tolerated the procedure well.  DISPOSITION:  She will be discharged from the emergency department.  She will be given Tylox for pain and told to take Keflex for five days.  I told her to remove the bandage and the packing tomorrow and begin warm water soaks three times a day.  We will see her back in our office in one week for follow-up. DD:  05/22/00 TD:  05/22/00 Job: [id number]

## 2010-07-28 ENCOUNTER — Other Ambulatory Visit (INDEPENDENT_AMBULATORY_CARE_PROVIDER_SITE_OTHER): Payer: BC Managed Care – PPO

## 2010-07-28 DIAGNOSIS — E876 Hypokalemia: Secondary | ICD-10-CM

## 2010-07-28 LAB — BASIC METABOLIC PANEL
BUN: 14 mg/dL (ref 6–23)
CO2: 28 mEq/L (ref 19–32)
Creat: 1.06 mg/dL (ref 0.40–1.20)
Sodium: 134 mEq/L — ABNORMAL LOW (ref 135–145)

## 2010-07-28 NOTE — Progress Notes (Signed)
Bmp done today Antione Obar 

## 2010-07-29 NOTE — Progress Notes (Signed)
  Subjective:    Patient ID: Andrea [name], female    DOB: 19-May-1955, 55 y.o.   MRN: [id number]  HPI    Review of Systems     Objective:   Physical Exam        Assessment & Plan:

## 2010-07-29 NOTE — Progress Notes (Signed)
Addended by: Macy Mis on: 07/29/2010 09:24 AM   Modules accepted: Orders, Level of Service

## 2010-07-29 NOTE — Progress Notes (Signed)
  Subjective:    Patient ID: Andrea [name], female    DOB: 19-Jul-1955, 55 y.o.   MRN: [id number]  HPI  discusssed potassium with patient- she takes one tablet twice a day regularly.  Will increase to 3 tablets daily.  Recheck in 1 week  Review of Systems     Objective:   Physical Exam        Assessment & Plan:

## 2010-08-18 ENCOUNTER — Other Ambulatory Visit: Payer: Self-pay | Admitting: Family Medicine

## 2010-08-18 DIAGNOSIS — Z1231 Encounter for screening mammogram for malignant neoplasm of breast: Secondary | ICD-10-CM

## 2010-08-23 ENCOUNTER — Ambulatory Visit (INDEPENDENT_AMBULATORY_CARE_PROVIDER_SITE_OTHER): Payer: BC Managed Care – PPO | Admitting: Family Medicine

## 2010-08-23 ENCOUNTER — Other Ambulatory Visit: Payer: Self-pay | Admitting: Family Medicine

## 2010-08-23 ENCOUNTER — Encounter: Payer: Self-pay | Admitting: Family Medicine

## 2010-08-23 VITALS — BP 138/86 | HR 70 | Temp 98.7°F | Ht 63.0 in | Wt 252.0 lb

## 2010-08-23 DIAGNOSIS — Z01419 Encounter for gynecological examination (general) (routine) without abnormal findings: Secondary | ICD-10-CM | POA: Insufficient documentation

## 2010-08-23 DIAGNOSIS — E876 Hypokalemia: Secondary | ICD-10-CM

## 2010-08-23 DIAGNOSIS — N951 Menopausal and female climacteric states: Secondary | ICD-10-CM

## 2010-08-23 DIAGNOSIS — F339 Major depressive disorder, recurrent, unspecified: Secondary | ICD-10-CM

## 2010-08-23 LAB — BASIC METABOLIC PANEL
Calcium: 9.1 mg/dL (ref 8.4–10.5)
Glucose, Bld: 86 mg/dL (ref 70–99)
Sodium: 139 mEq/L (ref 135–145)

## 2010-08-23 MED ORDER — SERTRALINE HCL 100 MG PO TABS: 150.0000 mg | ORAL_TABLET | Freq: Every day | ORAL | Status: DC

## 2010-08-23 NOTE — Assessment & Plan Note (Signed)
Normal exam   Has appt for mammogram in July. UTD on screening

## 2010-08-23 NOTE — Patient Instructions (Signed)
Follow-up in 6 months or sooner if needed.  We will check your potassium level, if we need to change your potassium ills, I will call you, otherwise well send you a letter in the mail.  You may ask your insurance for a formulary list to see which medicines are cheaper for you and I would be happy to see if there is a cheaper estrogen.   Increase sertraline to 15- mg daily (1.5 tablets)

## 2010-08-23 NOTE — Assessment & Plan Note (Signed)
Continue premarin 

## 2010-08-23 NOTE — Assessment & Plan Note (Signed)
Increase sertraline from 100 to 150 mg.

## 2010-08-23 NOTE — Progress Notes (Signed)
  Subjective:    Patient ID: Andrea [name], female    DOB: Jun 15, 1955, 55 y.o.   MRN: [id number]  HPI  Annual Gynecological Exam   Wt Readings from Last 3 Encounters:  08/23/10 252 lb (114.306 kg)  06/27/10 246 lb 11.2 oz (111.902 kg)  05/25/09 229 lb (103.874 kg)   Last period:  hysterectony Regular periods: no Heavy bleeding: no  Sexually active: no Birth control or hormonal therapy: yes, started premarinin may 2012 for hot flashes, muchimproved Hx of STD: Patient does not desire STD screening Dyspareunia: No Hot flashes: No Vaginal discharge: no Dysuria:No   Last mammogram: July 2011 Breast mass or concerns: No Last Pap: Had one normal pap after hysterectomy    History of abnormal pap: see above  FH of breast, uterine, ovarian, colon cancer: No  Depression:  Has been improving after death in the family in 04-30-2022.  Denies crying, tearfulness, anxiety.  Not sleeping well.  States struggles financially are taking a toll.  No SI.  Overall states she is doing well but feels she needs to increase sertraline.  Menopause:  States much improvement on premarin. Only issue is cost.  States it costs $40 per month.  Review of Systemsnegative except as per HPI     Objective:   Physical Exam BP 138/86  Pulse 70  Temp(Src) 98.7 F (37.1 C) (Oral)  Ht 5\' 3"  (1.6 m)  Wt 252 lb (114.306 kg)  BMI 44.64 kg/m2 GEN: Alert & Oriented, No acute distress HEENT:  Neck supple, no thyromegaly. Breast:  No masses, skin changes. Nipple discharge CV:  Regular Rate & Rhythm, no murmur Respiratory:  Normal work of breathing, CTAB Abd:  + BS, soft, no tenderness to palpation Ext: no pre-tibial edema GU:  No external lesions, no pelvic masses on bimanual exam.   Assessment & Plan:

## 2010-08-23 NOTE — Assessment & Plan Note (Addendum)
Taking 40 meq bid.  Will recheck K today

## 2010-08-24 ENCOUNTER — Telehealth: Payer: Self-pay | Admitting: Family Medicine

## 2010-08-24 DIAGNOSIS — E876 Hypokalemia: Secondary | ICD-10-CM

## 2010-08-24 NOTE — Telephone Encounter (Signed)
Called to let Dr Earnest Bailey know the name of the alternative medicine for Premerin - Estradiol - her insurance will cover this.

## 2010-08-25 MED ORDER — ESTRADIOL 0.5 MG PO TABS: 0.5000 mg | ORAL_TABLET | Freq: Every day | ORAL | Status: DC

## 2010-08-25 NOTE — Telephone Encounter (Signed)
Please let patient know I have sent in prescription for estradiol to her pharmacy.  We will start at a low dose, have her make appt for follow-up to discuss further if continues to have bothersome menopausal symptoms.

## 2010-08-26 LAB — MAGNESIUM: Magnesium: 2 mg/dL (ref 1.5–2.5)

## 2010-08-26 NOTE — Telephone Encounter (Signed)
Addended by: [place], Bexley Mclester on: 08/26/2010 09:38 AM   Modules accepted: Orders

## 2010-08-30 ENCOUNTER — Encounter: Payer: Self-pay | Admitting: Family Medicine

## 2010-08-30 ENCOUNTER — Ambulatory Visit (HOSPITAL_COMMUNITY)
Admission: RE | Admit: 2010-08-30 | Discharge: 2010-08-30 | Disposition: A | Discharge status: Home / Self Care | Payer: BC Managed Care – PPO | Source: Ambulatory Visit | Attending: Family Medicine | Admitting: Family Medicine

## 2010-08-30 DIAGNOSIS — Z1231 Encounter for screening mammogram for malignant neoplasm of breast: Secondary | ICD-10-CM | POA: Insufficient documentation

## 2011-02-03 ENCOUNTER — Ambulatory Visit (INDEPENDENT_AMBULATORY_CARE_PROVIDER_SITE_OTHER): Payer: BC Managed Care – PPO | Admitting: Family Medicine

## 2011-02-03 VITALS — BP 157/112 | HR 88 | Ht 63.0 in | Wt 251.0 lb

## 2011-02-03 DIAGNOSIS — I10 Essential (primary) hypertension: Secondary | ICD-10-CM

## 2011-02-03 DIAGNOSIS — IMO0001 Reserved for inherently not codable concepts without codable children: Secondary | ICD-10-CM

## 2011-02-03 DIAGNOSIS — E876 Hypokalemia: Secondary | ICD-10-CM

## 2011-02-03 DIAGNOSIS — F339 Major depressive disorder, recurrent, unspecified: Secondary | ICD-10-CM

## 2011-02-03 DIAGNOSIS — E785 Hyperlipidemia, unspecified: Secondary | ICD-10-CM

## 2011-02-03 DIAGNOSIS — N951 Menopausal and female climacteric states: Secondary | ICD-10-CM

## 2011-02-03 DIAGNOSIS — Z23 Encounter for immunization: Secondary | ICD-10-CM

## 2011-02-03 DIAGNOSIS — M791 Myalgia, unspecified site: Secondary | ICD-10-CM

## 2011-02-03 LAB — COMPREHENSIVE METABOLIC PANEL
ALT: 16 U/L (ref 0–35)
Albumin: 4 g/dL (ref 3.5–5.2)
Alkaline Phosphatase: 68 U/L (ref 39–117)
CO2: 30 mEq/L (ref 19–32)
Glucose, Bld: 82 mg/dL (ref 70–99)
Potassium: 3.7 mEq/L (ref 3.5–5.3)
Sodium: 138 mEq/L (ref 135–145)
Total Protein: 7.4 g/dL (ref 6.0–8.3)

## 2011-02-03 LAB — CBC WITH DIFFERENTIAL/PLATELET
Basophils Absolute: 0 10*3/uL (ref 0.0–0.1)
Basophils Relative: 0 % (ref 0–1)
Eosinophils Absolute: 0.1 10*3/uL (ref 0.0–0.7)
Eosinophils Relative: 2 % (ref 0–5)
HCT: 36.3 % (ref 36.0–46.0)
Lymphocytes Relative: 44 % (ref 12–46)
MCHC: 32.8 g/dL (ref 30.0–36.0)
MCV: 88.1 fL (ref 78.0–100.0)
Monocytes Absolute: 0.5 10*3/uL (ref 0.1–1.0)
Platelets: 271 10*3/uL (ref 150–400)
RDW: 14.4 % (ref 11.5–15.5)
WBC: 4.9 10*3/uL (ref 4.0–10.5)

## 2011-02-03 LAB — TSH: TSH: 4.118 u[IU]/mL (ref 0.350–4.500)

## 2011-02-03 MED ORDER — LISINOPRIL 10 MG PO TABS: 10.0000 mg | ORAL_TABLET | Freq: Every day | ORAL | Status: DC

## 2011-02-03 NOTE — Assessment & Plan Note (Signed)
We discussed the role of obesity and deconditioning on generalized fatigue and" stickiness". Will check her TSH, CBC, potassium as these have been abnormal in the past. We'll followup in 2 weeks

## 2011-02-03 NOTE — Patient Instructions (Addendum)
Will taper off estrogen Start taking every other day for 2 weeks, then try stopping.  Will recheck your thyroid and potassium today which can make you achey  Consider seeing a nutrition expert-let me know if you are interested and I will arrange it for you  Consider increasing exercise and working on healthy weight to help with aches and pains.n take tylenol.  New blood pressure medicine- lisinopril  Your blood pressure goal is less than 130/80.  Come back in 2-3 weeks for follow-up of high blood pressure.

## 2011-02-03 NOTE — Progress Notes (Signed)
  Subjective:    Patient ID: Andrea [name], female    DOB: Aug 24, 1955, 55 y.o.   MRN: [id number]  HPI  HYPERTENSION  BP Readings from Last 3 Encounters:  02/03/11 164/92  08/23/10 138/86  06/27/10 127/95    Hypertension ROS: taking medications as instructed, no medication side effects noted, no TIA's, no chest pain on exertion, no dyspnea on exertion and no swelling of ankles.    HYPERLIPIDEMIA/Obesity  Diet: Not following low cholesterol diet but started taking soy protein shakes Exercise: No regular exercise Wt Readings from Last 3 Encounters:  02/03/11 251 lb (113.853 kg)  08/23/10 252 lb (114.306 kg)  06/27/10 246 lb 11.2 oz (111.902 kg)   ROS:  Denies RUQ pain, myalgias, or symptoms or coronary ischemia Lab Results  Component Value Date   LDLCALC 99 06/27/2010   Lab Results  Component Value Date   CHOL 153 06/27/2010   CHOL 166 03/02/2009   CHOL 184 03/25/2008   Lab Results  Component Value Date   HDL 37* 06/27/2010   HDL 42 03/02/2009   HDL 52 03/25/2008   Lab Results  Component Value Date   TRIG 85 06/27/2010   TRIG 90 03/02/2009   TRIG 73 03/25/2008   Lab Results  Component Value Date   ALT 20 06/27/2010   AST 38* 06/27/2010   ALKPHOS 88 06/27/2010   BILITOT 0.4 06/27/2010   Pain:  Notes several weeks of bilateral neck and shoulder tension with some left-sided arm, thigh, knee pain. Has not improved with changing sleep positions. Describes pain as achy in nature. No weakness or change in bowel or bladder habits      Review of Systemssee above     Objective:   Physical Exam  GEN: Alert & Oriented, No acute distress CV:  Regular Rate & Rhythm, no murmur Respiratory:  Normal work of breathing, CTAB Abd:  + BS, soft, no tenderness to palpation Ext: no pre-tibial edema MSK:  Tight in distribution of trapezius bilaterally. Neuro:  5/5 strength upper and lower extremities.  Reflexes 2+ ilaterally biceps and patellar.  CN 2-12 grossly intact.  No pronator  drift, normal finger to nose.      Assessment & Plan:

## 2011-02-03 NOTE — Assessment & Plan Note (Signed)
Will recheck today along with magnesium

## 2011-02-03 NOTE — Assessment & Plan Note (Signed)
Will titrate off Premarin do to poorly controlled hypertension.

## 2011-02-03 NOTE — Assessment & Plan Note (Signed)
She is taking 100 mg daily as opposed to 150 mg. Advised to continue prescribed dose

## 2011-02-03 NOTE — Assessment & Plan Note (Signed)
Blood pressure is poorly controlled today. Will start lisinopril and will taper off oral estrogen used in treatment for menopause.

## 2011-02-03 NOTE — Assessment & Plan Note (Signed)
Discussed role of weight gain and chronic medical conditions. She is not interested in nutrition referral at this time

## 2011-02-22 ENCOUNTER — Encounter: Payer: Self-pay | Admitting: Family Medicine

## 2011-02-22 ENCOUNTER — Ambulatory Visit (INDEPENDENT_AMBULATORY_CARE_PROVIDER_SITE_OTHER): Payer: BC Managed Care – PPO | Admitting: Family Medicine

## 2011-02-22 VITALS — BP 134/95 | HR 73 | Temp 98.4°F | Ht 63.0 in | Wt 245.9 lb

## 2011-02-22 DIAGNOSIS — I10 Essential (primary) hypertension: Secondary | ICD-10-CM

## 2011-02-22 LAB — BASIC METABOLIC PANEL
BUN: 28 mg/dL — ABNORMAL HIGH (ref 6–23)
Calcium: 9.5 mg/dL (ref 8.4–10.5)
Chloride: 104 mEq/L (ref 96–112)
Creat: 0.9 mg/dL (ref 0.50–1.10)

## 2011-02-22 NOTE — Progress Notes (Signed)
  Subjective:    Patient ID: Andrea [name], female    DOB: 04-Jun-1955, 55 y.o.   MRN: [id number]  HPI Here for HTN f/u 2-3 weeks ago  HYPERTENSION  Was elevated, started lisinopril  BP Readings from Last 3 Encounters:  02/22/11 134/95  02/03/11 157/112  08/23/10 138/86    Hypertension ROS: taking medications as instructed, no medication side effects noted, patient does not perform home BP monitoring, no TIA's, no chest pain on exertion, no dyspnea on exertion, no swelling of ankles and no intermittent claudication symptoms.       Review of Systems See above    Objective:   Physical Exam  GEN: Alert & Oriented, No acute distress CV:  Regular Rate & Rhythm, no murmur Respiratory:  Normal work of breathing, CTAB Ext: no pre-tibial edema       Assessment & Plan:

## 2011-02-22 NOTE — Assessment & Plan Note (Signed)
Hypertension much improved, will recheck BMEt after initiation of lisinopril.  Advised home BP monitoring and lifestyle modification.  Declines further assistance at this time.  Follow-up in 3-6 month.

## 2011-02-22 NOTE — Patient Instructions (Signed)
Follow-up in 3-6 months  Keep a log of your blood pressure  Eat real foods!  And exercise daily  If you would like extra help, we can arrange that at any time

## 2011-02-23 ENCOUNTER — Encounter: Payer: Self-pay | Admitting: Family Medicine

## 2011-04-19 ENCOUNTER — Other Ambulatory Visit: Payer: Self-pay | Admitting: Family Medicine

## 2011-04-19 MED ORDER — TRIAMTERENE-HCTZ 37.5-25 MG PO TABS: 1.0000 | ORAL_TABLET | Freq: Every day | ORAL | Status: DC

## 2011-04-19 MED ORDER — LEVOTHYROXINE SODIUM 100 MCG PO TABS: 100.0000 ug | ORAL_TABLET | Freq: Every day | ORAL | Status: DC

## 2011-04-19 MED ORDER — SIMVASTATIN 40 MG PO TABS: 40.0000 mg | ORAL_TABLET | Freq: Every day | ORAL | Status: DC

## 2012-01-29 ENCOUNTER — Ambulatory Visit (INDEPENDENT_AMBULATORY_CARE_PROVIDER_SITE_OTHER): Payer: BC Managed Care – PPO | Admitting: Family Medicine

## 2012-01-29 VITALS — BP 134/94 | HR 69 | Ht 63.0 in | Wt 252.0 lb

## 2012-01-29 DIAGNOSIS — E039 Hypothyroidism, unspecified: Secondary | ICD-10-CM

## 2012-01-29 DIAGNOSIS — E785 Hyperlipidemia, unspecified: Secondary | ICD-10-CM

## 2012-01-29 DIAGNOSIS — I10 Essential (primary) hypertension: Secondary | ICD-10-CM

## 2012-01-29 DIAGNOSIS — F339 Major depressive disorder, recurrent, unspecified: Secondary | ICD-10-CM

## 2012-01-29 DIAGNOSIS — R5383 Other fatigue: Secondary | ICD-10-CM | POA: Insufficient documentation

## 2012-01-29 DIAGNOSIS — N951 Menopausal and female climacteric states: Secondary | ICD-10-CM

## 2012-01-29 DIAGNOSIS — R5381 Other malaise: Secondary | ICD-10-CM

## 2012-01-29 MED ORDER — SERTRALINE HCL 100 MG PO TABS: 100.0000 mg | ORAL_TABLET | Freq: Every day | ORAL | Status: DC

## 2012-01-29 NOTE — Progress Notes (Signed)
  Subjective:    Patient ID: Andrea [name], female    DOB: [birth year], 56 y.o.   MRN: [id number]  HPIHere for follow-up - has not been seen in about a year.  Main concern is mood.  Depression:  Notes has been feeling sad and crying more.  No new triggers.  Fatigue-has been sleeping more  10 am to 6-7 am.  Feels rested upon wakenedin, feels tired late in the day.   She had previously treated on sertraline, but misunderstood- Seems like lack of refills was interpreted as discontinuation.  No SI.  No visual/auditory hallucinations.  HYPERTENSION  Taking triamterene, not potassium supplement and not lisinopril   BP Readings from Last 3 Encounters:  01/29/12 134/94  02/22/11 134/95  02/03/11 157/112    Hypertension ROS: taking medications as instructed, no medication side effects noted, no chest pain on exertion, no dyspnea on exertion and no swelling of ankles.    HYPERLIPIDEMIA  Diet: Not following low cholesterol diet  Exercise: No regular exercise- states she does not have time Wt Readings from Last 3 Encounters:  01/29/12 252 lb (114.306 kg)  02/22/11 245 lb 14.4 oz (111.54 kg)  02/03/11 251 lb (113.853 kg)   ROS:  Denies RUQ pain, myalgias, or symptoms or coronary ischemia Lab Results  Component Value Date   LDLCALC 99 06/27/2010   Lab Results  Component Value Date   CHOL 153 06/27/2010   CHOL 166 03/02/2009   CHOL 184 03/25/2008   Lab Results  Component Value Date   HDL 37* 06/27/2010   HDL 42 03/02/2009   HDL 52 03/25/2008   Lab Results  Component Value Date   TRIG 85 06/27/2010   TRIG 90 03/02/2009   TRIG 73 03/25/2008   Lab Results  Component Value Date   ALT 16 02/03/2011   AST 33 02/03/2011   ALKPHOS 68 02/03/2011   BILITOT 0.4 02/03/2011     I have reviewed patient's  PMH, FH, and Social history and Medications as related to this visit.   Review of Systems See HPI    Objective:   Physical Exam GEN: Alert & Oriented, No acute distress CV:  Regular Rate &  Rhythm, no murmur Respiratory:  Normal work of breathing, CTAB Abd:  + BS, soft, no tenderness to palpation Ext: no pre-tibial edema Psych: flat affect, tired appearing.  Speech- slowed.  Alert and oriented        Assessment & Plan:  .declined flu shot

## 2012-01-29 NOTE — Assessment & Plan Note (Addendum)
Lipids higher than last year- LDL 99 vs 134.  Patient reports dietary indiscretion and not taking medications regularly.  Will plan on restarting meds and lifestyle modification, will recheck LDl in 3-6 months

## 2012-01-29 NOTE — Assessment & Plan Note (Signed)
Will restart sertaline 50 mg for 1 week, then 100 mg daily, which was her previous dose.  Follow-up in 4 weeks

## 2012-01-29 NOTE — Assessment & Plan Note (Signed)
BP ok on just triamterene/hctz.  Will d/c lisinopril as she is not taking it.  WIll recheck electrolytes an d fasting blood work.  Has history of hypokalemia

## 2012-01-29 NOTE — Patient Instructions (Addendum)
Sertraline- mood medicine.  Start with one half tablet daily for 1 week, the increase to one tablet daily  Make lab appointment for fasting labwork- only water for 6-8 hours  Follow-up appointment in 1 month for recheck to see if we need to make adjustments in medicine  Call if you have concerns sooner

## 2012-01-29 NOTE — Assessment & Plan Note (Addendum)
TSH elevated.  Called patient- she reports missing many doses lately.  WIll work on compliance, recheck in 6-8 weeks

## 2012-01-31 ENCOUNTER — Other Ambulatory Visit: Payer: BC Managed Care – PPO

## 2012-01-31 DIAGNOSIS — E039 Hypothyroidism, unspecified: Secondary | ICD-10-CM

## 2012-01-31 DIAGNOSIS — E785 Hyperlipidemia, unspecified: Secondary | ICD-10-CM

## 2012-01-31 LAB — COMPREHENSIVE METABOLIC PANEL
AST: 40 U/L — ABNORMAL HIGH (ref 0–37)
Albumin: 3.9 g/dL (ref 3.5–5.2)
BUN: 12 mg/dL (ref 6–23)
CO2: 30 mEq/L (ref 19–32)
Calcium: 8.9 mg/dL (ref 8.4–10.5)
Chloride: 105 mEq/L (ref 96–112)
Creat: 0.91 mg/dL (ref 0.50–1.10)
Potassium: 3.5 mEq/L (ref 3.5–5.3)

## 2012-01-31 LAB — TSH: TSH: 8.709 u[IU]/mL — ABNORMAL HIGH (ref 0.350–4.500)

## 2012-01-31 LAB — LIPID PANEL
Cholesterol: 195 mg/dL (ref 0–200)
Triglycerides: 114 mg/dL (ref <150)
VLDL: 23 mg/dL (ref 0–40)

## 2012-01-31 NOTE — Progress Notes (Signed)
CMP,CBC WITH DIFF,FLP AND TSH DONE TODAY Akya Fiorello 

## 2012-02-01 NOTE — Addendum Note (Signed)
Addended by: Macy Mis on: 02/01/2012 08:52 AM   Modules accepted: Orders

## 2012-02-02 LAB — CBC WITH DIFFERENTIAL/PLATELET
Basophils Absolute: 0 10*3/uL (ref 0.0–0.1)
Basophils Relative: 0 % (ref 0–1)
HCT: 35.3 % — ABNORMAL LOW (ref 36.0–46.0)
Hemoglobin: 11.7 g/dL — ABNORMAL LOW (ref 12.0–15.0)
Lymphocytes Relative: 55 % — ABNORMAL HIGH (ref 12–46)
MCHC: 33.1 g/dL (ref 30.0–36.0)
Monocytes Relative: 10 % (ref 3–12)
Neutro Abs: 1.9 10*3/uL (ref 1.7–7.7)
Neutrophils Relative %: 34 % — ABNORMAL LOW (ref 43–77)
RDW: 14.4 % (ref 11.5–15.5)
WBC: 5.5 10*3/uL (ref 4.0–10.5)

## 2012-02-17 ENCOUNTER — Other Ambulatory Visit: Payer: Self-pay | Admitting: Family Medicine

## 2012-02-19 ENCOUNTER — Other Ambulatory Visit: Payer: Self-pay | Admitting: *Deleted

## 2012-02-19 MED ORDER — SIMVASTATIN 40 MG PO TABS: 40.0000 mg | ORAL_TABLET | Freq: Every day | ORAL | Status: DC

## 2012-02-19 MED ORDER — TRIAMTERENE-HCTZ 37.5-25 MG PO TABS: 1.0000 | ORAL_TABLET | Freq: Every day | ORAL | Status: DC

## 2012-08-26 ENCOUNTER — Other Ambulatory Visit: Payer: Self-pay | Admitting: Family Medicine

## 2012-08-26 DIAGNOSIS — Z1231 Encounter for screening mammogram for malignant neoplasm of breast: Secondary | ICD-10-CM

## 2012-08-28 ENCOUNTER — Ambulatory Visit (HOSPITAL_COMMUNITY)
Admission: RE | Admit: 2012-08-28 | Discharge: 2012-08-28 | Disposition: A | Discharge status: Home / Self Care | Payer: BC Managed Care – PPO | Source: Ambulatory Visit | Attending: Family Medicine | Admitting: Family Medicine

## 2012-08-28 DIAGNOSIS — Z1231 Encounter for screening mammogram for malignant neoplasm of breast: Secondary | ICD-10-CM | POA: Insufficient documentation

## 2012-12-23 ENCOUNTER — Ambulatory Visit (INDEPENDENT_AMBULATORY_CARE_PROVIDER_SITE_OTHER): Payer: BC Managed Care – PPO | Admitting: Family Medicine

## 2012-12-23 ENCOUNTER — Encounter: Payer: Self-pay | Admitting: Family Medicine

## 2012-12-23 VITALS — BP 128/81 | HR 84 | Temp 98.8°F | Ht 63.0 in | Wt 246.9 lb

## 2012-12-23 DIAGNOSIS — I10 Essential (primary) hypertension: Secondary | ICD-10-CM

## 2012-12-23 DIAGNOSIS — IMO0001 Reserved for inherently not codable concepts without codable children: Secondary | ICD-10-CM

## 2012-12-23 DIAGNOSIS — M791 Myalgia, unspecified site: Secondary | ICD-10-CM

## 2012-12-23 DIAGNOSIS — E039 Hypothyroidism, unspecified: Secondary | ICD-10-CM

## 2012-12-23 DIAGNOSIS — E785 Hyperlipidemia, unspecified: Secondary | ICD-10-CM

## 2012-12-23 DIAGNOSIS — Z23 Encounter for immunization: Secondary | ICD-10-CM

## 2012-12-23 LAB — COMPREHENSIVE METABOLIC PANEL
AST: 37 U/L (ref 0–37)
Albumin: 4.2 g/dL (ref 3.5–5.2)
Alkaline Phosphatase: 62 U/L (ref 39–117)
Calcium: 9.3 mg/dL (ref 8.4–10.5)
Chloride: 100 mEq/L (ref 96–112)
Potassium: 3.5 mEq/L (ref 3.5–5.3)
Sodium: 136 mEq/L (ref 135–145)
Total Protein: 7.4 g/dL (ref 6.0–8.3)

## 2012-12-23 LAB — LIPID PANEL
LDL Cholesterol: 123 mg/dL — ABNORMAL HIGH (ref 0–99)
VLDL: 25 mg/dL (ref 0–40)

## 2012-12-23 LAB — POCT GLYCOSYLATED HEMOGLOBIN (HGB A1C): Hemoglobin A1C: 5.6

## 2012-12-23 MED ORDER — SERTRALINE HCL 100 MG PO TABS: 100.0000 mg | ORAL_TABLET | Freq: Every day | ORAL | Status: DC

## 2012-12-23 MED ORDER — TRIAMTERENE-HCTZ 37.5-25 MG PO TABS: 1.0000 | ORAL_TABLET | Freq: Every day | ORAL | Status: DC

## 2012-12-23 NOTE — Assessment & Plan Note (Addendum)
A: R leg pains at night. Normal exam. No signs of DVT. Unlikely Restless leg syndrome given unilateral distribution P: Check CMP Check A1c Lifestyle changes per AVS: For R leg cramps and numbness at nights: 1. Increase intake of water (do this by decreasing pepsi to once daily or every other day) 2. Exercise: walking 30 minutes 2-3 times per week. The more the better 3. Checking blood sugar and electrolytes today.

## 2012-12-23 NOTE — Assessment & Plan Note (Signed)
A: BP well controlled  Meds: compliant  P: continue current regime Check CMP (Na, K, Cr)

## 2012-12-23 NOTE — Assessment & Plan Note (Signed)
Check lipid panel  

## 2012-12-23 NOTE — Patient Instructions (Signed)
Andrea [name],  Thank you for coming in to see me today. It was a pleasure meeting you.  I will refill you levothyroxine and simvastatin after I have had time to review blood work results.   For R leg cramps and numbness at nights: 1. Increase intake of water (do this by decreasing pepsi to once daily or every other day) 2. Exercise: walking 30 minutes 2-3 times per week. The more the better 3. Checking blood sugar and electrolytes today.   Plan to f/u in 6 months. It may be sooner depending on blood work.  Dr. Armen Pickup

## 2012-12-23 NOTE — Assessment & Plan Note (Signed)
A: no s/s of thyroid derangement Med: compliant P: check TSH

## 2012-12-23 NOTE — Progress Notes (Signed)
  Subjective:    Patient ID: Andrea [name], female    DOB: 1955-03-09, 57 y.o.   MRN: [id number]  HPI A 57 year old female with history of morbid obesity, hypertension, hypothyroidism presents for follow visit to discuss the following:  #1 hypertension: Patient compliant with blood pressure medication. She denies chest pain, shortness of breath, significant lower extremity edema. She drinks soda frequently. She does not exercise regularly.  #2 hypothyroidism: Patient is compliant with levotyroxine. She denies chest pain palpitations. She denies weight changes.  #3 right leg pain: Patient reports at least 9 months of nightly or right leg pain. The pain is described as muscle spasms and numbness. The pain is in the posterior aspect of her right leg over the popliteal fossa and down towards the calf. She denies recent injury. She denies left-sided symptoms. She has no history of diabetes.  Health maintenance: flu shot given today.   Social history reviewed: Patient is a nonsmoker. Review of Systems As per HPI     Objective:   Physical Exam BP 128/81  Pulse 84  Temp(Src) 98.8 F (37.1 C) (Oral)  Ht 5\' 3"  (1.6 m)  Wt 246 lb 14.4 oz (111.993 kg)  BMI 43.75 kg/m2 General appearance: alert, cooperative, no distress and morbidly obese Lungs: clear to auscultation bilaterally Heart: regular rate and rhythm, S1, S2 normal, no murmur, click, rub or gallop Extremities: extremities normal, atraumatic, no cyanosis or edema, no R calf tenderness or signs of DVT.  Neurologic: Grossly normal     Assessment & Plan:

## 2012-12-24 ENCOUNTER — Telehealth: Payer: Self-pay | Admitting: Family Medicine

## 2012-12-24 ENCOUNTER — Encounter: Payer: Self-pay | Admitting: Family Medicine

## 2012-12-24 DIAGNOSIS — E039 Hypothyroidism, unspecified: Secondary | ICD-10-CM

## 2012-12-24 MED ORDER — LEVOTHYROXINE SODIUM 125 MCG PO TABS: 125.0000 ug | ORAL_TABLET | Freq: Every day | ORAL | Status: DC

## 2012-12-24 MED ORDER — SIMVASTATIN 40 MG PO TABS: 40.0000 mg | ORAL_TABLET | Freq: Every day | ORAL | Status: DC

## 2012-12-24 NOTE — Telephone Encounter (Signed)
zocor sent in.

## 2012-12-24 NOTE — Assessment & Plan Note (Signed)
Elevated TSH Increased synthroid to 125 mcg daily as patient reported compliance.  Repeat TSH in 8 weeks.

## 2012-12-24 NOTE — Telephone Encounter (Signed)
Pt called because she is out of her Zocor and would like it called in. She also wanted DR. Funches to know she is getting better at taking her medication. JW

## 2012-12-24 NOTE — Telephone Encounter (Signed)
Called patient .left VM.   The test results show that your blood sugar and electrolytes are normal. Your TSH is high which tells me that you are not getting enough thyroid replacement hormone. This could very well be the reason for your leg pain.   Have you been taking the synthroid every morning on an empty stomach without missing doses? If, so please start the increased dose of 125 mcg daily. If not, please call my office to let me know and continue the 100 mcg dose. We will repeat your TSH in 8 weeks after you have taken the synthroid every day.   Letter sent.

## 2012-12-24 NOTE — Telephone Encounter (Signed)
Will forward to MD. Jazmin Hartsell,CMA  

## 2013-02-03 ENCOUNTER — Other Ambulatory Visit: Payer: BC Managed Care – PPO

## 2013-02-03 DIAGNOSIS — E039 Hypothyroidism, unspecified: Secondary | ICD-10-CM

## 2013-02-03 NOTE — Progress Notes (Signed)
TSH DONE TODAY Andrea [name] 

## 2013-02-04 ENCOUNTER — Telehealth: Payer: Self-pay | Admitting: Family Medicine

## 2013-02-04 ENCOUNTER — Encounter: Payer: Self-pay | Admitting: Family Medicine

## 2013-02-04 DIAGNOSIS — E039 Hypothyroidism, unspecified: Secondary | ICD-10-CM

## 2013-02-04 LAB — TSH: TSH: 4.795 u[IU]/mL — ABNORMAL HIGH (ref 0.350–4.500)

## 2013-02-04 MED ORDER — LEVOTHYROXINE SODIUM 137 MCG PO TABS: 137.0000 ug | ORAL_TABLET | Freq: Every day | ORAL | Status: DC

## 2013-02-04 NOTE — Telephone Encounter (Signed)
Pt is aware.  Jazmin Hartsell,CMA  

## 2013-02-04 NOTE — Telephone Encounter (Signed)
Please call patient. TSH nearing goal. Synthroid refilled at slightly dose of 137 mcg daily, 90 day supply.  Repeat TSH in 3 months.  Results letter sent, please keep as a reminder.

## 2013-02-04 NOTE — Telephone Encounter (Signed)
LM for pt to call back.  Andrea [name],CMA  

## 2013-04-09 ENCOUNTER — Ambulatory Visit: Payer: BC Managed Care – PPO | Admitting: Family Medicine

## 2013-04-11 ENCOUNTER — Ambulatory Visit: Payer: BC Managed Care – PPO | Admitting: Family Medicine

## 2013-09-04 ENCOUNTER — Ambulatory Visit (INDEPENDENT_AMBULATORY_CARE_PROVIDER_SITE_OTHER): Payer: BC Managed Care – PPO | Admitting: Family Medicine

## 2013-09-04 ENCOUNTER — Encounter: Payer: Self-pay | Admitting: Family Medicine

## 2013-09-04 VITALS — BP 134/89 | HR 72 | Temp 98.4°F | Wt 240.9 lb

## 2013-09-04 DIAGNOSIS — H5713 Ocular pain, bilateral: Secondary | ICD-10-CM

## 2013-09-04 DIAGNOSIS — H5710 Ocular pain, unspecified eye: Secondary | ICD-10-CM | POA: Insufficient documentation

## 2013-09-04 MED ORDER — AZELASTINE HCL 0.05 % OP SOLN: 1.0000 [drp] | Freq: Two times a day (BID) | OPHTHALMIC | Status: DC

## 2013-09-04 NOTE — Assessment & Plan Note (Signed)
Patient's symptoms most consistent for allergic conjunctivitis vs viral conjunctivitis. Facial pain makes allergic conjunctivitis more likely. With viral conjunctivitis I'd expect the spread to the right eye to be more prominent. Feel this is less like bacterial in nature. With retinal detachment, I'd expect a decrease in her visual acuity, as with acute closure glaucoma. - Azelastine eye drops twice daily - Opthalmology referral to simply obtain the patient's baseline (pt is due for an eye exam anyways).  - Pt advised to RTC or seek care if she experiences increased eye pain, change in the nature of her eye pain, change in discharge, or sudden change in vision.  -Should f/u with PCP in 1-2wks or sooner if sx worsen

## 2013-09-04 NOTE — Patient Instructions (Addendum)
You came in with eye discomfort and redness, more severe on the left than the right.  From the symptoms, this seems most likely due to an allergic conjunctivitis. Use the eye drops 1-2 times daily. If your eye discomfort worsens, the discharge changes in color/thickness, or you develop new symptoms seek medical care. Follow up in 1-2 weeks with Dr. Ree Kida or sooner if symptoms worsen  Allergic Conjunctivitis The conjunctiva is a thin membrane that covers the visible white part of the eyeball and the underside of the eyelids. This membrane protects and lubricates the eye. The membrane has small blood vessels running through it that can normally be seen. When the conjunctiva becomes inflamed, the condition is called conjunctivitis. In response to the inflammation, the conjunctival blood vessels become swollen. The swelling results in redness in the normally white part of the eye. The blood vessels of this membrane also react when a person has allergies and is then called allergic conjunctivitis. This condition usually lasts for as long as the allergy persists. Allergic conjunctivitis cannot be passed to another person (non-contagious). The likelihood of bacterial infection is great and the cause is not likely due to allergies if the inflamed eye has:  A sticky discharge.  Discharge or sticking together of the lids in the morning.  Scaling or flaking of the eyelids where the eyelashes come out.  Red swollen eyelids. CAUSES   Viruses.  Irritants such as foreign bodies.  Chemicals.  General allergic reactions.  Inflammation or serious diseases in the inside or the outside of the eye or the orbit (the boney cavity in which the eye sits) can cause a "red eye." SYMPTOMS   Eye redness.  Tearing.  Itchy eyes.  Burning feeling in the eyes.  Clear drainage from the eye.  Allergic reaction due to pollens or ragweed sensitivity. Seasonal allergic conjunctivitis is frequent in the spring  when pollens are in the air and in the fall. DIAGNOSIS  This condition, in its many forms, is usually diagnosed based on the history and an ophthalmological exam. It usually involves both eyes. If your eyes react at the same time every year, allergies may be the cause. While most "red eyes" are due to allergy or an infection, the role of an eye (ophthalmological) exam is important. The exam can rule out serious diseases of the eye or orbit. TREATMENT   Non-antibiotic eye drops, ointments, or medications by mouth may be prescribed if the ophthalmologist is sure the conjunctivitis is due to allergies alone.  Over-the-counter drops and ointments for allergic symptoms should be used only after other causes of conjunctivitis have been ruled out, or as your caregiver suggests. Medications by mouth are often prescribed if other allergy-related symptoms are present. If the ophthalmologist is sure that the conjunctivitis is due to allergies alone, treatment is normally limited to drops or ointments to reduce itching and burning. HOME CARE INSTRUCTIONS   Wash hands before and after applying drops or ointments, or touching the inflamed eye(s) or eyelids.  Do not let the eye dropper tip or ointment tube touch the eyelid when putting medicine in your eye.  Stop using your soft contact lenses and throw them away. Use a new pair of lenses when recovery is complete. You should run through sterilizing cycles at least three times before use after complete recovery if the old soft contact lenses are to be used. Hard contact lenses should be stopped. They need to be thoroughly sterilized before use after recovery.  Itching and  burning eyes due to allergies is often relieved by using a cool cloth applied to closed eye(s). SEEK MEDICAL CARE IF:   Your problems do not go away after two or three days of treatment.  Your lids are sticky (especially in the morning when you wake up) or stick together.  Discharge  develops. Antibiotics may be needed either as drops, ointment, or by mouth.  You have extreme light sensitivity.  An oral temperature above 102 F (38.9 C) develops.  Pain in or around the eye or any other visual symptom develops. MAKE SURE YOU:   Understand these instructions.  Will watch your condition.  Will get help right away if you are not doing well or get worse. Document Released: 05/06/2002 Document Revised: 05/08/2011 Document Reviewed: 04/01/2007 [facility] Patient Information 2015 [place], [place]. This information is not intended to replace advice given to you by your health care provider. Make sure you discuss any questions you have with your health care provider.

## 2013-09-04 NOTE — Progress Notes (Signed)
Patient ID: Andrea [name], female   DOB: [birth year], 58 y.o.   MRN: [id number]  Andrea Cords, MD  Subjective:  Chief complaint-eye pain  Pt presenting with bilateral eye pain x 5-7 days, the left more severe than the right. She notes her eyes are itchy and have a clear watery discharge. Endorses waking up with the discharge crusted, making it difficult to open her eye. Her left eye has become erythematous. She endorses some scratching, aching of the left eye with movement. Denies any stabbing pain. She notes occasional blue dots, but denies any blurred vision or vision loss. Pt denies a previous history of this. Denies sick contacts.  ROS- Pt endorses rhinorrhea and an "itchy nose." Intermittent frontal/maxially headaches on the left side that are dull in nature.  Denies: halos around lights, fevers, chills, pharyngitis, otalgias   Past Medical History Patient Active Problem List   Diagnosis Date Noted  . Eye pain 09/04/2013  . Myalgia 02/03/2011  . HYPOTHYROIDISM, UNSPECIFIED 04/26/2006  . HYPERLIPIDEMIA 04/26/2006  . OBESITY, NOS 04/26/2006  . DEPRESSION, MAJOR, RECURRENT 04/26/2006  . HYPERTENSION, BENIGN SYSTEMIC 04/26/2006    Medications- reviewed and updated Current Outpatient Prescriptions  Medication Sig Dispense Refill  . aspirin (ASPIRIN CHILDRENS) 81 MG chewable tablet Chew 81 mg by mouth daily.        [name] levothyroxine (SYNTHROID, LEVOTHROID) 137 MCG tablet Take 1 tablet (137 mcg total) by mouth daily before breakfast.  90 tablet  0  . sertraline (ZOLOFT) 100 MG tablet Take 1 tablet (100 mg total) by mouth daily.  30 tablet  11  . simvastatin (ZOCOR) 40 MG tablet Take 1 tablet (40 mg total) by mouth at bedtime.  90 tablet  3  . triamterene-hydrochlorothiazide (MAXZIDE-25) 37.5-25 MG per tablet Take 1 tablet by mouth daily.  30 tablet  11  . azelastine (OPTIVAR) 0.05 % ophthalmic solution Place 1 drop into both eyes 2 (two) times daily.  6 mL  0   No current  facility-administered medications for this visit.   Pt denies a h/o smoking/tobacco abuse.  Objective: BP 134/89  Pulse 72  Temp(Src) 98.4 F (36.9 C)  Wt 240 lb 14.4 oz (109.272 kg) Gen: NAD, alert, cooperative with exam. Lying down wrapped in a blanket on my entrance HENT: NCAT, Orophaynx clear without erythema or exudate. MMM. No nasal drainage noted. Eye: Conjunctival injection of the left eye, most prominent on the temporal side. Minimal conjunctival injection of the right eye. No drainage noted.  EOMI- mild pain with left lateral gaze.  PERRL. Fundoscopic exam revealed a normal optic disc with not sings of edema. CV: RRR, good S1/S2, no murmur Resp: CTABL, no wheezes, non-labored Ext: No edema, warm Neuro: Alert and oriented, No gross deficits  Visual acuity testing: Both: 20/20 Right: 20/25 Left 20/20 . Assessment/Plan:  Eye pain Patient's symptoms most consistent for allergic conjunctivitis vs viral conjunctivitis. Facial pain makes allergic conjunctivitis more likely. With viral conjunctivitis I'd expect the spread to the right eye to be more prominent. Feel this is less like bacterial in nature. With retinal detachment, I'd expect a decrease in her visual acuity, as with acute closure glaucoma. - Azelastine eye drops twice daily - Opthalmology referral to simply obtain the patient's baseline (pt is due for an eye exam anyways).  - Pt advised to RTC or seek care if she experiences increased eye pain, change in the nature of her eye pain, change in discharge, or sudden change in vision.  -Should f/u with  PCP in 1-2wks or sooner if sx worsen    Orders Placed This Encounter  Procedures  . Ambulatory referral to Ophthalmology    Referral Priority:  Routine    Referral Type:  Consultation    Referral Reason:  Specialty Services Required    Requested Specialty:  Ophthalmology    Number of Visits Requested:  1    Meds ordered this encounter  Medications  . azelastine  (OPTIVAR) 0.05 % ophthalmic solution    Sig: Place 1 drop into both eyes 2 (two) times daily.    Dispense:  6 mL    Refill:  0

## 2013-09-19 ENCOUNTER — Encounter: Payer: Self-pay | Admitting: Family Medicine

## 2013-09-19 DIAGNOSIS — H269 Unspecified cataract: Secondary | ICD-10-CM | POA: Insufficient documentation

## 2013-09-22 ENCOUNTER — Encounter: Payer: Self-pay | Admitting: Family Medicine

## 2013-09-22 ENCOUNTER — Ambulatory Visit (HOSPITAL_COMMUNITY)
Admission: RE | Admit: 2013-09-22 | Discharge: 2013-09-22 | Disposition: A | Discharge status: Home / Self Care | Payer: BC Managed Care – PPO | Source: Ambulatory Visit | Attending: Family Medicine | Admitting: Family Medicine

## 2013-09-22 ENCOUNTER — Ambulatory Visit (INDEPENDENT_AMBULATORY_CARE_PROVIDER_SITE_OTHER): Payer: BC Managed Care – PPO | Admitting: Family Medicine

## 2013-09-22 VITALS — BP 128/90 | HR 73 | Temp 98.2°F | Wt 241.0 lb

## 2013-09-22 DIAGNOSIS — R002 Palpitations: Secondary | ICD-10-CM

## 2013-09-22 DIAGNOSIS — I10 Essential (primary) hypertension: Secondary | ICD-10-CM

## 2013-09-22 DIAGNOSIS — E039 Hypothyroidism, unspecified: Secondary | ICD-10-CM

## 2013-09-22 DIAGNOSIS — I440 Atrioventricular block, first degree: Secondary | ICD-10-CM

## 2013-09-22 HISTORY — DX: Atrioventricular block, first degree: I44.0

## 2013-09-22 HISTORY — DX: Palpitations: R00.2

## 2013-09-22 NOTE — Assessment & Plan Note (Signed)
EKG completed for palpitations showed sinus bradycardia with 1st degree AV block. She is otherwise asymptomatic. -monitor clinically

## 2013-09-22 NOTE — Patient Instructions (Addendum)
It was nice to meet you today  Hypothyroid - we will check your thyroid hormone today, Dr. Ree Kida will call you with your results  Palpitations/heart fluttering - suspect due to thyroid, check level today  Blood pressure - no changes to blood pressure medications today.

## 2013-09-22 NOTE — Assessment & Plan Note (Signed)
Patient due for recheck of TSH -adjust Synthroid based on TSH

## 2013-09-22 NOTE — Assessment & Plan Note (Signed)
Patient having palpitations that are likely due to thyroid dysfunction. EKG showed 1st degree AV block without other abnormalities. -check TSH

## 2013-09-22 NOTE — Assessment & Plan Note (Signed)
Well controlled on Maxzide. -no changes to pharmacotherapy

## 2013-09-22 NOTE — Progress Notes (Signed)
   Subjective:    Patient ID: Andrea [name], female    DOB: 01/20/1956, 58 y.o.   MRN: [id number]  HPI 58 y/o female presents for routine follow up.  Hypothyroidism - ran out of 137 mg Synthroid last month and has been taking previous prescription (lower dose of Synthroid), see below ROS  HTN - currently on Maxzide, no side effects, no chest pain, no vision changes  Palpitations - intermittent over the past few months, mostly at nighttime, no associated chest pain/lightheadedness/syncope  Social - never a smoker   Review of Systems  Constitutional: Positive for fatigue. Negative for fever and chills.  Respiratory: Negative for shortness of breath.   Cardiovascular: Positive for palpitations. Negative for chest pain.  Gastrointestinal: Positive for constipation. Negative for abdominal pain and diarrhea.       Objective:   Physical Exam Vitals: reviewed Gen: pleasant female, NAD HEENT: normocephalic, PERRL, EOMI, no conjunctival erythema, no scleral icterus, nasal septum midline, no rhinorrhea, MMM, neck supple Cardiac: RRR, S1 and S2 present, no murmurs, no heaves/thrills Resp: CTAB, normal effort Ext: trace pedal edema  EKG: Bradycardia (pulse of 57) with 1st degree AV block, no ischemic changes.      Assessment & Plan:  Please see problem specific assessment and plan.

## 2013-09-23 LAB — TSH: TSH: 8.971 u[IU]/mL — ABNORMAL HIGH (ref 0.350–4.500)

## 2013-09-24 ENCOUNTER — Telehealth: Payer: Self-pay | Admitting: Family Medicine

## 2013-09-24 MED ORDER — LEVOTHYROXINE SODIUM 137 MCG PO TABS: 137.0000 ug | ORAL_TABLET | Freq: Every day | ORAL | Status: DC

## 2013-09-24 NOTE — Telephone Encounter (Signed)
Discussed TSH results, sent prescription for synthroid 137 mg

## 2014-01-25 ENCOUNTER — Other Ambulatory Visit: Payer: Self-pay | Admitting: Family Medicine

## 2014-03-23 ENCOUNTER — Ambulatory Visit: Payer: BC Managed Care – PPO | Admitting: Family Medicine

## 2014-04-06 ENCOUNTER — Ambulatory Visit (INDEPENDENT_AMBULATORY_CARE_PROVIDER_SITE_OTHER): Payer: BC Managed Care – PPO | Admitting: Family Medicine

## 2014-04-06 ENCOUNTER — Encounter: Payer: Self-pay | Admitting: Family Medicine

## 2014-04-06 VITALS — BP 148/101 | HR 71 | Temp 98.4°F | Ht 63.0 in | Wt 235.1 lb

## 2014-04-06 DIAGNOSIS — Z23 Encounter for immunization: Secondary | ICD-10-CM

## 2014-04-06 DIAGNOSIS — I10 Essential (primary) hypertension: Secondary | ICD-10-CM | POA: Diagnosis not present

## 2014-04-06 DIAGNOSIS — E038 Other specified hypothyroidism: Secondary | ICD-10-CM | POA: Diagnosis not present

## 2014-04-06 LAB — TSH: TSH: 14.785 u[IU]/mL — ABNORMAL HIGH (ref 0.350–4.500)

## 2014-04-06 NOTE — Assessment & Plan Note (Signed)
Slightly elevated today, did not take BP meds for past 48 hours -restart meds -return in 2 weeks for BP check -counseled on importance of taking meds daily, discussed pill box

## 2014-04-06 NOTE — Progress Notes (Signed)
   Subjective:    Patient ID: Andrea [name], female    DOB: 12/15/1955, 59 y.o.   MRN: [id number]  HPI 59 y/o female presents for routine follow up.  Hypothyroid - taking 137 mcg synthroid daily, admits missing doses at least twice per week, no palpitations, some constipation and dry skin  HTN - did not take BP meds for past few days, currently on Maxide, no chest pain/headace/sob  Depression - takes zoloft daily, started taking years ago after death of family member, did not discuss fully today   Review of Systems  Constitutional: Negative for fever, chills and fatigue.  Respiratory: Negative for cough, chest tightness and shortness of breath.   Cardiovascular: Negative for chest pain.       Objective:   Physical Exam Vitals: reviewed Gen: pleasant female, NAD Cardiac: RRR, S1 and S2 present, no murmur Resp: CTAB, normal effort Ext: trace edema present      Assessment & Plan:  Please see problem specific assessment and plan.

## 2014-04-06 NOTE — Assessment & Plan Note (Signed)
Due for recheck of TSH -lab ordered -patient counseled on importance of taking medications daily

## 2014-04-06 NOTE — Patient Instructions (Signed)
Thyroid - check TSH today, continue current dose of syntroid  High Blood Pressure - start to take home meds, return in 2 weeks for BP check, check at home as well   Mood - continue Zoloft, lets discuss in more detail at your next visit.

## 2014-04-07 ENCOUNTER — Telehealth: Payer: Self-pay | Admitting: Family Medicine

## 2014-04-07 DIAGNOSIS — E038 Other specified hypothyroidism: Secondary | ICD-10-CM

## 2014-04-07 NOTE — Assessment & Plan Note (Signed)
Discussed TSH of 14 with patient, she states that she has only been taking synthroid 3-4 times per week, counseled her to take daily, will recheck in 6 weeks.

## 2014-04-07 NOTE — Telephone Encounter (Signed)
Discussed TSH with patient, she states that she has only been taking synthroid 3-4 times per week, counseled her to take daily, will recheck in 6 weeks.

## 2014-04-14 ENCOUNTER — Telehealth: Payer: Self-pay | Admitting: Family Medicine

## 2014-04-14 MED ORDER — LEVOTHYROXINE SODIUM 137 MCG PO TABS: 137.0000 ug | ORAL_TABLET | Freq: Every day | ORAL | Status: DC

## 2014-04-14 NOTE — Telephone Encounter (Signed)
Pt says MD was supposed to put her on a synthroid medication but nothing has been called in.

## 2014-04-14 NOTE — Telephone Encounter (Signed)
Refill sent to pharmacy.   

## 2014-04-20 ENCOUNTER — Ambulatory Visit (INDEPENDENT_AMBULATORY_CARE_PROVIDER_SITE_OTHER): Payer: BC Managed Care – PPO | Admitting: Family Medicine

## 2014-04-20 ENCOUNTER — Encounter: Payer: Self-pay | Admitting: Family Medicine

## 2014-04-20 VITALS — BP 142/92 | Temp 98.7°F | Ht 63.0 in | Wt 236.0 lb

## 2014-04-20 DIAGNOSIS — F32A Depression, unspecified: Secondary | ICD-10-CM

## 2014-04-20 DIAGNOSIS — I10 Essential (primary) hypertension: Secondary | ICD-10-CM

## 2014-04-20 DIAGNOSIS — F329 Major depressive disorder, single episode, unspecified: Secondary | ICD-10-CM | POA: Diagnosis not present

## 2014-04-20 NOTE — Patient Instructions (Signed)
Your blood pressure was slightly elevated today.   As we discussed please check your blood pressure at home at least 4-5 times per week. Please call those results into the office. If your blood pressure remains high we will need to add another blood pressure medication.   Just a reminder to have your thyroid level checked in 3-4 weeks.

## 2014-04-20 NOTE — Assessment & Plan Note (Signed)
Blood pressures remain borderline elevated -After discussion with patient it was decided for patient to check home blood pressures for the next 2 weeks, if they continue to be elevated will add amlodipine 5 mg daily

## 2014-04-20 NOTE — Assessment & Plan Note (Signed)
Patient reports depressed mood which is primarily situational related to her work environment and home situation. -Continue Zoloft 100 mg daily -Patient counseled to make time for herself on a daily basis and to seek pleasurable activities -Counseled on warning signs for worsening depression and return precautions

## 2014-04-20 NOTE — Progress Notes (Signed)
   Subjective:    Patient ID: Andrea [name], female    DOB: 05-Nov-1955, 59 y.o.   MRN: [id number]  HPI 59 y/o female presents for follow up of blood pressure and to discuss mood/depression.   Hypertension-patient reports taking Maxide daily, the nose one dose over the weekend, no side effects, no chest pain, no headaches, no vision changes, she does not check her blood pressure at home  Depression-patient reports that she is easy to anger, she has multiple life stressors including poor work environment, she works for a for [employer] as a Sports coach, she had a recent change in her supervisor, supervisor is new "does not know what he is doing ", her 11 year old daughter and grandchildren also live with her which add extra stressed her life, she denies difficulty with concentration, she denies poor sleep, she denies homicidal or suicidal thoughts, she does report a history of some suicidal thoughts   Review of Systems  Constitutional: Negative for fever, chills and fatigue.  Respiratory: Negative for shortness of breath.   Cardiovascular: Negative for chest pain.       Objective:   Physical Exam Vitals: Reviewed in excellent general: Pleasant African-American female, no acute distress HEENT: Normocephalic, pupils equal round and reactive to light bilaterally, moist mucous membranes, uvula midline Cardiac: Regular rate and rhythm, S1 and S2 present, no murmurs, no heaves or thrills exam Respiratory: Clear to patient bilaterally, normal effort Psych: Dressed appropriately, no flight of ideas, affect is slightly blunted, mood is stated as depressed, no HI or SI     Assessment & Plan:  Please see problem specific assessment and plan.

## 2014-05-26 ENCOUNTER — Encounter: Payer: Self-pay | Admitting: Family Medicine

## 2014-05-26 ENCOUNTER — Ambulatory Visit (INDEPENDENT_AMBULATORY_CARE_PROVIDER_SITE_OTHER): Payer: BC Managed Care – PPO | Admitting: Family Medicine

## 2014-05-26 VITALS — BP 172/112 | HR 64 | Temp 98.1°F | Ht 63.0 in | Wt 231.7 lb

## 2014-05-26 DIAGNOSIS — E038 Other specified hypothyroidism: Secondary | ICD-10-CM

## 2014-05-26 DIAGNOSIS — I10 Essential (primary) hypertension: Secondary | ICD-10-CM

## 2014-05-26 DIAGNOSIS — F331 Major depressive disorder, recurrent, moderate: Secondary | ICD-10-CM | POA: Diagnosis not present

## 2014-05-26 LAB — CBC
HCT: 37.3 % (ref 36.0–46.0)
HEMOGLOBIN: 12.2 g/dL (ref 12.0–15.0)
MCH: 28.7 pg (ref 26.0–34.0)
MCHC: 32.7 g/dL (ref 30.0–36.0)
MCV: 87.8 fL (ref 78.0–100.0)
MPV: 10.1 fL (ref 8.6–12.4)
Platelets: 272 10*3/uL (ref 150–400)
RBC: 4.25 MIL/uL (ref 3.87–5.11)
RDW: 15.1 % (ref 11.5–15.5)
WBC: 5.1 10*3/uL (ref 4.0–10.5)

## 2014-05-26 LAB — TSH: TSH: 2.877 u[IU]/mL (ref 0.350–4.500)

## 2014-05-26 LAB — BASIC METABOLIC PANEL
BUN: 25 mg/dL — ABNORMAL HIGH (ref 6–23)
CALCIUM: 9.3 mg/dL (ref 8.4–10.5)
CHLORIDE: 102 meq/L (ref 96–112)
CO2: 31 meq/L (ref 19–32)
CREATININE: 0.82 mg/dL (ref 0.50–1.10)
Glucose, Bld: 88 mg/dL (ref 70–99)
Potassium: 3.4 mEq/L — ABNORMAL LOW (ref 3.5–5.3)
Sodium: 140 mEq/L (ref 135–145)

## 2014-05-26 MED ORDER — AMLODIPINE BESYLATE 5 MG PO TABS: 5.0000 mg | ORAL_TABLET | Freq: Every day | ORAL | Status: DC

## 2014-05-26 MED ORDER — SERTRALINE HCL 50 MG PO TABS
150.0000 mg | ORAL_TABLET | Freq: Every day | ORAL | Status: DC
Start: 2014-05-26 — End: 2014-11-10

## 2014-05-26 NOTE — Patient Instructions (Addendum)
High blood pressure - start Norvasc(Amlodipine) 5 mg daily in addition to maxide  Hypothyroidism - check TSH  Depressed Mood - increase Zoloft(Sertraline) to 150 mg daily  Return for recheck of BP and mood in 2-4 weeks

## 2014-05-27 ENCOUNTER — Encounter: Payer: Self-pay | Admitting: Family Medicine

## 2014-05-27 DIAGNOSIS — E876 Hypokalemia: Secondary | ICD-10-CM | POA: Insufficient documentation

## 2014-05-28 NOTE — Assessment & Plan Note (Signed)
Patient has history of hypothyroidism with medication noncompliance -due for recheck of TSH

## 2014-05-28 NOTE — Assessment & Plan Note (Signed)
Blood pressure remains elevated -add Norvasc 5 mg to regimen -patient counseled on proper use of home BP cuff -follow up in 1-2 weeks to monitor BP

## 2014-05-28 NOTE — Assessment & Plan Note (Signed)
Patient continues to have depressed mood related to home and work situation (see HPI) -increased Zoloft to 150 mg daily -return for follow up in 2-4 weeks -discussed return precautions

## 2014-05-28 NOTE — Progress Notes (Signed)
   Subjective:    Patient ID: Kathrin Greathouse, female    DOB: 06/26/1955, 59 y.o.   MRN: [id number]  HPI 59 y/o female presents for routine follow up.  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  Respiratory: Negative for cough and shortness of breath.   Cardiovascular: Negative for chest pain.  Gastrointestinal: Negative for nausea, vomiting and diarrhea.       Objective:   Physical Exam Vitals: reviewed Gen: pleasant AAF, NAD HEENT: normocephalic, PERRL, EOMI, no scleral icterus, MMM Cardiac: RRR, S1 and S2 present, no murmur Resp: CTAB, normal effort Ext: no edema Psych: dressed appropriately, affect mildly flattened, no tangential thoughts, not responding to internal stimuli, no SI or HI      Assessment & Plan:  Please see problem specific assessment and plan.

## 2014-06-13 ENCOUNTER — Emergency Department (HOSPITAL_COMMUNITY)
Admission: EM | Admit: 2014-06-13 | Discharge: 2014-06-13 | Disposition: A | Discharge status: Home / Self Care | Payer: BC Managed Care – PPO | Source: Home / Self Care | Attending: Family Medicine | Admitting: Family Medicine

## 2014-06-13 ENCOUNTER — Encounter (HOSPITAL_COMMUNITY): Payer: Self-pay

## 2014-06-13 DIAGNOSIS — H6122 Impacted cerumen, left ear: Secondary | ICD-10-CM | POA: Diagnosis not present

## 2014-06-13 DIAGNOSIS — H6592 Unspecified nonsuppurative otitis media, left ear: Secondary | ICD-10-CM | POA: Diagnosis not present

## 2014-06-13 MED ORDER — AMOXICILLIN 500 MG PO CAPS: 1000.0000 mg | ORAL_CAPSULE | Freq: Two times a day (BID) | ORAL | Status: DC

## 2014-06-13 NOTE — ED Notes (Signed)
C/o pain in her ear x 3 days . Minimal relief w OTC medications

## 2014-06-13 NOTE — Discharge Instructions (Signed)
Cerumen Impaction A cerumen impaction is when the wax in your ear forms a plug. This plug usually causes reduced hearing. Sometimes it also causes an earache or dizziness. Removing a cerumen impaction can be difficult and painful. The wax sticks to the ear canal. The canal is sensitive and bleeds easily. If you try to remove a heavy wax buildup with a cotton tipped swab, you may push it in further. Irrigation with water, suction, and small ear curettes may be used to clear out the wax. If the impaction is fixed to the skin in the ear canal, ear drops may be needed for a few days to loosen the wax. People who build up a lot of wax frequently can use ear wax removal products available in your local drugstore. SEEK MEDICAL CARE IF:  You develop an earache, increased hearing loss, or marked dizziness. Document Released: 03/23/2004 Document Revised: 05/08/2011 Document Reviewed: 05/13/2009 [facility] Patient Information 2015 [place], [place]. This information is not intended to replace advice given to you by your health care provider. Make sure you discuss any questions you have with your health care provider.  Otitis Media Otitis media is redness, soreness, and inflammation of the middle ear. Otitis media may be caused by allergies or, most commonly, by infection. Often it occurs as a complication of the common cold. SIGNS AND SYMPTOMS Symptoms of otitis media may include:  Earache.  Fever.  Ringing in your ear.  Headache.  Leakage of fluid from the ear. DIAGNOSIS To diagnose otitis media, your health care provider will examine your ear with an otoscope. This is an instrument that allows your health care provider to see into your ear in order to examine your eardrum. Your health care provider also will ask you questions about your symptoms. TREATMENT  Typically, otitis media resolves on its own within 3-5 days. Your health care provider may prescribe medicine to ease your symptoms of pain. If  otitis media does not resolve within 5 days or is recurrent, your health care provider may prescribe antibiotic medicines if he or she suspects that a bacterial infection is the cause. HOME CARE INSTRUCTIONS   If you were prescribed an antibiotic medicine, finish it all even if you start to feel better.  Take medicines only as directed by your health care provider.  Keep all follow-up visits as directed by your health care provider. SEEK MEDICAL CARE IF:  You have otitis media only in one ear, or bleeding from your nose, or both.  You notice a lump on your neck.  You are not getting better in 3-5 days.  You feel worse instead of better. SEEK IMMEDIATE MEDICAL CARE IF:   You have pain that is not controlled with medicine.  You have swelling, redness, or pain around your ear or stiffness in your neck.  You notice that part of your face is paralyzed.  You notice that the bone behind your ear (mastoid) is tender when you touch it. MAKE SURE YOU:   Understand these instructions.  Will watch your condition.  Will get help right away if you are not doing well or get worse. Document Released: 11/19/2003 Document Revised: 06/30/2013 Document Reviewed: 09/10/2012 [facility] Patient Information 2015 [place], [place]. This information is not intended to replace advice given to you by your health care provider. Make sure you discuss any questions you have with your health care provider.

## 2014-06-13 NOTE — ED Provider Notes (Signed)
CSN: [id number]     Arrival date & time 06/13/14  1435 History   First MD Initiated Contact with Patient 06/13/14 1535     Chief Complaint  Patient presents with  . Otalgia   (Consider location/radiation/quality/duration/timing/severity/associated sxs/prior Treatment) HPI Comments: 59 year old female complaining of left ear thumping and sharp pain. She is also complaining of decreased hearing for 3 days. No known trauma. Has been using Q-tips.   Past Medical History  Diagnosis Date  . GANGLION CYST, WRIST, RIGHT 10/06/2008    Qualifier: Diagnosis of  By: Drue Flirt  MD, Merrily Brittle    . Carpal tunnel syndrome 04/26/2006    Qualifier: Diagnosis of  By: Dorathy Daft MD, Marjory Lies     Past Surgical History  Procedure Laterality Date  . Abdominal hysterectomy      fibroids   Family History  Problem Relation Age of Onset  . Cancer Mother     cervial, pituitary, bone?  [name] Hypertension Mother   . Cancer Father   . Alcohol abuse Father   . Hypertension Sister   . Hypothyroidism Sister   . Cancer Brother     lung  . Hypertension Sister   . Hypothyroidism Sister   . Hypertension Sister   . Hypothyroidism Sister   . Hypertension Sister   . Hypothyroidism Sister   . Hypertension Sister   . Hypothyroidism Sister    History  Substance Use Topics  . Smoking status: Never Smoker   . Smokeless tobacco: Not on file  . Alcohol Use: No   OB History    No data available     Review of Systems  Constitutional: Negative.   HENT: Positive for ear pain. Negative for congestion, postnasal drip and rhinorrhea.   Eyes: Negative.   Gastrointestinal: Negative.     Allergies  Review of patient's allergies indicates no known allergies.  Home Medications   Prior to Admission medications   Medication Sig Start Date End Date Taking? Authorizing Provider  amLODipine (NORVASC) 5 MG tablet Take 1 tablet (5 mg total) by mouth daily. 05/26/14   Lupita Dawn, MD  aspirin (ASPIRIN CHILDRENS) 81 MG chewable  tablet Chew 81 mg by mouth daily.      Historical Provider, MD  azelastine (OPTIVAR) 0.05 % ophthalmic solution Place 1 drop into both eyes 2 (two) times daily. 09/04/13   Archie Patten, MD  levothyroxine (SYNTHROID, LEVOTHROID) 137 MCG tablet Take 1 tablet (137 mcg total) by mouth daily before breakfast. 04/14/14   Lupita Dawn, MD  sertraline (ZOLOFT) 50 MG tablet Take 3 tablets (150 mg total) by mouth daily. 05/26/14   Lupita Dawn, MD  simvastatin (ZOCOR) 40 MG tablet TAKE ONE TABLET BY MOUTH ONCE DAILY AT BEDTIME 01/26/14   Lupita Dawn, MD  triamterene-hydrochlorothiazide (MAXZIDE-25) 37.5-25 MG per tablet TAKE ONE TABLET BY MOUTH ONCE DAILY 01/26/14   Lupita Dawn, MD   BP 150/96 mmHg  Pulse 70  Temp(Src) 98.1 F (36.7 C) (Oral)  Resp 18  SpO2 100% Physical Exam  Constitutional: She is oriented to person, place, and time. She appears well-developed and well-nourished. No distress.  HENT:  Bilateral TMs are completely secured by hardened cerumen. The left EAC is partially filled with moist cerumen. The surface of the TM appears to be covered with hard brown cracked cerumen.  Post irrigation the TM is deeply erythematous and bulging. The cerumen has been removed.  Eyes: Conjunctivae and EOM are normal.  Neck: Normal range of motion. Neck supple.  Pulmonary/Chest: Effort normal. No respiratory distress.  Neurological: She is alert and oriented to person, place, and time.  Skin: Skin is warm and dry.  Nursing note and vitals reviewed.   ED Course  EAR CERUMEN REMOVAL Date/Time: 06/13/2014 4:43 PM Performed by: Marcha Dutton, Jaylnn Ullery Authorized by: Ihor Gully D Consent: Verbal consent obtained. Risks and benefits: risks, benefits and alternatives were discussed Consent given by: patient Patient understanding: patient states understanding of the procedure being performed Patient identity confirmed: verbally with patient Location details: left ear Procedure type: irrigation Patient  sedated: no Patient tolerance: Patient tolerated the procedure well with no immediate complications   (including critical care time) Labs Review Labs Reviewed - No data to display  Imaging Review No results found.   MDM   1. Impacted cerumen of left ear    Ear irrigation    Janne Napoleon, NP 06/13/14 1644

## 2014-06-22 ENCOUNTER — Ambulatory Visit (INDEPENDENT_AMBULATORY_CARE_PROVIDER_SITE_OTHER): Payer: BC Managed Care – PPO | Admitting: Family Medicine

## 2014-06-22 ENCOUNTER — Encounter: Payer: Self-pay | Admitting: Family Medicine

## 2014-06-22 VITALS — BP 128/88 | HR 67 | Temp 98.3°F | Ht 63.0 in | Wt 228.9 lb

## 2014-06-22 DIAGNOSIS — J309 Allergic rhinitis, unspecified: Secondary | ICD-10-CM | POA: Diagnosis not present

## 2014-06-22 DIAGNOSIS — F331 Major depressive disorder, recurrent, moderate: Secondary | ICD-10-CM

## 2014-06-22 DIAGNOSIS — H6502 Acute serous otitis media, left ear: Secondary | ICD-10-CM | POA: Diagnosis not present

## 2014-06-22 DIAGNOSIS — I10 Essential (primary) hypertension: Secondary | ICD-10-CM

## 2014-06-22 DIAGNOSIS — H6692 Otitis media, unspecified, left ear: Secondary | ICD-10-CM | POA: Insufficient documentation

## 2014-06-22 HISTORY — DX: Allergic rhinitis, unspecified: J30.9

## 2014-06-22 MED ORDER — FLUTICASONE PROPIONATE 50 MCG/ACT NA SUSP: 2.0000 | Freq: Every day | NASAL | Status: DC

## 2014-06-22 MED ORDER — LORATADINE 10 MG PO TABS: 10.0000 mg | ORAL_TABLET | Freq: Every day | ORAL | Status: DC

## 2014-06-22 NOTE — Patient Instructions (Signed)
It was nice to see you today.  Blood pressure is well controlled, pleased continue current medications  Mood - improved, continue Zoloft at 150 mg daily  Left ear pain - related to ear infection and allergies, complete antibiotics, start Flonase nasal spray and Claritin.   Return in 3 months

## 2014-06-22 NOTE — Assessment & Plan Note (Signed)
Controlled on Norvasc and Maxide -continue current regimen

## 2014-06-22 NOTE — Progress Notes (Signed)
   Subjective:    Patient ID: Andrea [name], female    DOB: 05-Feb-1956, 59 y.o.   MRN: [id number]  HPI 59 y/o female presents for follow up of blood pressure.  HTN - started on Norvasc 5 mg at last visit, also taking Triamterene - HCTZ 37.5-25 mg daily, has not been checking blood pressure at home recently, she did not bring in any readings, no chest pain/sob, some trouble focusing vision since start of ear pain  Mood - increased Zoloft to 150 mg daily at last visit, feeling better overall, work is getting better, "people are annoying her less at work", sleeping 5-6 hours per night, appetite is still good  Left OM - 4/16 at ER, started on Amoxicillin, no further pain however does report some "muffled" sounds   Review of Systems  Constitutional: Negative for fever, chills and fatigue.  Respiratory: Negative for cough and shortness of breath.   Gastrointestinal: Negative for nausea, vomiting and diarrhea.  See HPI     Objective:   Physical Exam Vitals: reviewed Gen: pleasant female, NAD, African American HEENT: normocephalic, PERRL, EOMI, no scleral icterus, MMM, neck supple, right TM obscured by cerumen, left TM erythemetous (blood effusion present) Cardiac: RRR, S1 and S2 present, no murmur, no heaves/thrills Resp: CTAB, normal effort Psych: appropriately dressed, affect is slightly flat, mood is state as good, no SI or HI       Assessment & Plan:  Please see problem specific assessment and plan.

## 2014-06-22 NOTE — Assessment & Plan Note (Signed)
Mood is improved with Zoloft 150 mg daily -continue current dose

## 2014-06-22 NOTE — Assessment & Plan Note (Signed)
Patient diagnosed with left OM on 4/16 in ED. Currently on Amoxicillin. She reports improvement of symptoms however still has some pain and decreased hearing. -complete course of Amoxicillin -patient has symptoms consistent with allergic rhinitis that may be causing eustacian tube dysfunction, this may not be allowing the ear to drain properly, start Flonase and Claritin.

## 2014-07-28 ENCOUNTER — Telehealth: Payer: Self-pay | Admitting: Family Medicine

## 2014-07-28 NOTE — Telephone Encounter (Signed)
Pt called back to make sure that the doctor or nurse call her on her [phone]. jw

## 2014-07-28 NOTE — Telephone Encounter (Signed)
Spoke with pt about medicine. She wanted to know why her ears were still clogged up. Pt wasn't using the flonase like dr Ree Kida told her. Informed her to use that and if she is still having problems, come in for an appt. Andrea [name], CMA

## 2014-07-28 NOTE — Telephone Encounter (Signed)
Andrea [name] called to say that she's still having problems hearing from her left ear even after using the medication for it.  Also experiencing pain as well.

## 2014-08-07 ENCOUNTER — Other Ambulatory Visit: Payer: Self-pay | Admitting: *Deleted

## 2014-08-07 MED ORDER — LEVOTHYROXINE SODIUM 137 MCG PO TABS: 137.0000 ug | ORAL_TABLET | Freq: Every day | ORAL | Status: DC

## 2014-08-12 ENCOUNTER — Other Ambulatory Visit (HOSPITAL_COMMUNITY): Payer: Self-pay | Admitting: Family Medicine

## 2014-08-12 DIAGNOSIS — Z1231 Encounter for screening mammogram for malignant neoplasm of breast: Secondary | ICD-10-CM

## 2014-08-24 ENCOUNTER — Ambulatory Visit (HOSPITAL_COMMUNITY)
Admission: RE | Admit: 2014-08-24 | Discharge: 2014-08-24 | Disposition: A | Discharge status: Home / Self Care | Payer: BC Managed Care – PPO | Source: Ambulatory Visit | Attending: Family Medicine | Admitting: Family Medicine

## 2014-08-24 ENCOUNTER — Ambulatory Visit: Payer: BC Managed Care – PPO | Admitting: Family Medicine

## 2014-08-24 DIAGNOSIS — Z1231 Encounter for screening mammogram for malignant neoplasm of breast: Secondary | ICD-10-CM | POA: Diagnosis present

## 2014-08-26 ENCOUNTER — Other Ambulatory Visit: Payer: Self-pay | Admitting: Family Medicine

## 2014-08-26 DIAGNOSIS — R928 Other abnormal and inconclusive findings on diagnostic imaging of breast: Secondary | ICD-10-CM

## 2014-08-28 ENCOUNTER — Other Ambulatory Visit: Payer: Self-pay | Admitting: Family Medicine

## 2014-08-28 ENCOUNTER — Ambulatory Visit
Admission: RE | Admit: 2014-08-28 | Discharge: 2014-08-28 | Disposition: A | Discharge status: Home / Self Care | Payer: BC Managed Care – PPO | Source: Ambulatory Visit | Attending: Family Medicine | Admitting: Family Medicine

## 2014-08-28 DIAGNOSIS — R928 Other abnormal and inconclusive findings on diagnostic imaging of breast: Secondary | ICD-10-CM

## 2014-08-28 HISTORY — DX: Other abnormal and inconclusive findings on diagnostic imaging of breast: R92.8

## 2014-09-07 ENCOUNTER — Ambulatory Visit (INDEPENDENT_AMBULATORY_CARE_PROVIDER_SITE_OTHER): Payer: BC Managed Care – PPO | Admitting: Family Medicine

## 2014-09-07 ENCOUNTER — Encounter: Payer: Self-pay | Admitting: Family Medicine

## 2014-09-07 VITALS — BP 119/91 | HR 79 | Temp 98.6°F | Ht 63.0 in | Wt 230.4 lb

## 2014-09-07 DIAGNOSIS — H6592 Unspecified nonsuppurative otitis media, left ear: Secondary | ICD-10-CM | POA: Diagnosis not present

## 2014-09-07 DIAGNOSIS — F331 Major depressive disorder, recurrent, moderate: Secondary | ICD-10-CM

## 2014-09-07 DIAGNOSIS — H6590 Unspecified nonsuppurative otitis media, unspecified ear: Secondary | ICD-10-CM | POA: Insufficient documentation

## 2014-09-07 DIAGNOSIS — E038 Other specified hypothyroidism: Secondary | ICD-10-CM | POA: Diagnosis not present

## 2014-09-07 DIAGNOSIS — R0683 Snoring: Secondary | ICD-10-CM

## 2014-09-07 NOTE — Progress Notes (Signed)
Subjective:     Patient ID: Andrea [name], female   DOB: 1955/09/16, 59 y.o.   MRN: [id number]  HPI   Review of Systems  Possible sleep apnea - Patient states that her breath cuts off at night, she sleeps on one pillow at night.  - Patient has never had a sleep study in the past.  - Patient is amenable to having a sleep study scheduled for her. Her mother used a CPAP machine in the past.   Depression - PHQ9 score: 12 level = moderate risk of depression  - Patient may go a day without eating. She states that she has no appetite. This does not happen all the time.   - Patient states that her depression is better but not well controlled.  - Patient is amenable to having a Manufacturing engineer.  - Patient has no suicidal ideation and does not expression wanting to harm others. Her daughter does get on her nerves.   HEARING - Patient states that her symptoms come and go even though she is using her flonase and loratadine. She denies any ringing or pain in the ear. She states that there is some thumping in the ear. She states that sometimes she feels like there is some draining but has not seen any.      Objective:   Physical Exam  Weber: Sound clearer on the right  Rinne L: air is better than conduction  Rinne R: air is better than conduction - consistent with sensorineural loss.   Assessment:     See problem specific assessment and plan.      Plan:     See problem specific assessment and plan.   Note completed by MS3.       I personally saw and evaluated the patient. I have reviewed the medical student note and agree with the documentation. I personally completed the physical exam. Additions to the medical student note are made in blue.   Dossie Arbour MD

## 2014-09-07 NOTE — Assessment & Plan Note (Signed)
Patient asymptomatic. Recent TSH stable. -continue current dose of Synthroid

## 2014-09-07 NOTE — Assessment & Plan Note (Signed)
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 Has had mile relief with Flonase and Claritin. -referral to ENT today for further management

## 2014-09-07 NOTE — Patient Instructions (Signed)
-   For the snoring we will send you to go for a sleep study and this could help better understand a cause of you daytime fatigue.  - Depression: We will continue to take the Zoloft and we can refer you to a councilor  - Thyroid: Tests look great, if you start to feel andy cold intolerance, having changes in your bowel movements let us know and we can test you again.  - We will refer you to an ear nose and throat doctor to look closer at your hearing loss.  - For the mammogram follow up in 6 months with them. - Thank you for coming and Have a great week.

## 2014-09-07 NOTE — Assessment & Plan Note (Signed)
Mood is stable however patient remains depressed. PHQ 12 -patient has symptoms of OSA that may be contributing to depression -referral made for sleep study -continue Zoloft 150 mg daily -encouraged counseling if she is interested

## 2014-09-07 NOTE — Progress Notes (Signed)
   Subjective:    Patient ID: Andrea [name], female    DOB: 09/10/1955, 59 y.o.   MRN: [id number]  HPI 59 y/o female presents for routine follow up.  Depression - taking Zoloft 150 mg daily, patient describes waxing and waning of symptoms, in general feels improvement but still depressed, associated mood swings  Loss of hearing in left ear - intermittent, can't hear on the telephone, denies Q-tip use, using Claritin and Flonase, no dizziness/vertigo, no drainage out of the left ear  Hypothyroidism - taking synthroid 137 mcg daily, no side effects, no constipation, very infrequent loose stools (twice over the past 2 months), no dry skin, no cold intolerance, does report fatigue over the past year, daytime sleepiness, affecting her ability to do her job, does have snoring and some sob at night, able to lay flat, uses 0-1 pillows at night  Social - works as Sports coach at [employer], not working over the summer, 31 year daughter lives with her   Review of Systems  Constitutional: Positive for fatigue. Negative for fever and chills.  Respiratory: Positive for shortness of breath. Negative for cough.   Cardiovascular: Negative for chest pain and leg swelling.  Gastrointestinal: Negative for nausea and diarrhea.  Psychiatric/Behavioral: Negative for suicidal ideas, sleep disturbance and self-injury.       Objective:   Physical Exam Vitals: reviewed Gen: pleasant AAF, NAD HEENT: normocephalic, PERRL, EOMI, no scleral icterus, right TM obscured by wax, minimal wax in left ear, serous effusion present behind left TM, no rhinorrhea, MMM, Weber lateralized to right side, AC>BC bilaterally (consistent with possible sensorineural hearing loss) Cardiac: RRR, S1 and S2 present, no murmur, no heaves/thrills Resp: CTAB, normal effort Psych: flat affect, mood is stated as depressed, does not appear to be reacting to internal stimuli, no SI, no HI  PHQ 9 score of 12, "somwhat difficult" answer to  affecting daily life, see scanned document    Assessment & Plan:  Please see problem specific assessment and plan.

## 2014-09-07 NOTE — Assessment & Plan Note (Signed)
Patient reports snoring at night, chronic daytime sleepiness, and occasional sob at night. Suspect OSA -referral made for sleep study

## 2014-09-16 ENCOUNTER — Other Ambulatory Visit: Payer: Self-pay | Admitting: *Deleted

## 2014-09-16 ENCOUNTER — Other Ambulatory Visit: Payer: Self-pay | Admitting: Family Medicine

## 2014-09-16 DIAGNOSIS — I10 Essential (primary) hypertension: Secondary | ICD-10-CM

## 2014-09-16 MED ORDER — AMLODIPINE BESYLATE 5 MG PO TABS: 5.0000 mg | ORAL_TABLET | Freq: Every day | ORAL | Status: DC

## 2014-10-22 ENCOUNTER — Other Ambulatory Visit: Payer: Self-pay | Admitting: Family Medicine

## 2014-11-10 ENCOUNTER — Other Ambulatory Visit: Payer: Self-pay | Admitting: Family Medicine

## 2014-12-09 ENCOUNTER — Encounter (HOSPITAL_BASED_OUTPATIENT_CLINIC_OR_DEPARTMENT_OTHER): Payer: BC Managed Care – PPO

## 2015-03-15 ENCOUNTER — Encounter (HOSPITAL_BASED_OUTPATIENT_CLINIC_OR_DEPARTMENT_OTHER): Payer: BC Managed Care – PPO

## 2015-03-26 ENCOUNTER — Other Ambulatory Visit: Payer: Self-pay | Admitting: *Deleted

## 2015-03-26 DIAGNOSIS — I10 Essential (primary) hypertension: Secondary | ICD-10-CM

## 2015-03-26 MED ORDER — AMLODIPINE BESYLATE 5 MG PO TABS: 5.0000 mg | ORAL_TABLET | Freq: Every day | ORAL | Status: DC

## 2015-09-14 ENCOUNTER — Other Ambulatory Visit: Payer: Self-pay | Admitting: Family Medicine

## 2015-09-15 ENCOUNTER — Telehealth: Payer: Self-pay | Admitting: Family Medicine

## 2015-09-15 ENCOUNTER — Other Ambulatory Visit: Payer: Self-pay | Admitting: Family Medicine

## 2015-09-15 MED ORDER — LEVOTHYROXINE SODIUM 137 MCG PO TABS: ORAL_TABLET | ORAL | Status: DC

## 2015-09-15 NOTE — Telephone Encounter (Signed)
Pt is calling for a refill on her Levothyroxine called in. jw

## 2015-10-07 ENCOUNTER — Telehealth: Payer: Self-pay | Admitting: Licensed Clinical Social Worker

## 2015-10-07 ENCOUNTER — Ambulatory Visit (INDEPENDENT_AMBULATORY_CARE_PROVIDER_SITE_OTHER): Payer: BC Managed Care – PPO | Admitting: Family Medicine

## 2015-10-07 DIAGNOSIS — R0789 Other chest pain: Secondary | ICD-10-CM | POA: Diagnosis not present

## 2015-10-07 DIAGNOSIS — F331 Major depressive disorder, recurrent, moderate: Secondary | ICD-10-CM | POA: Diagnosis not present

## 2015-10-07 DIAGNOSIS — E038 Other specified hypothyroidism: Secondary | ICD-10-CM | POA: Diagnosis not present

## 2015-10-07 DIAGNOSIS — I10 Essential (primary) hypertension: Secondary | ICD-10-CM | POA: Diagnosis not present

## 2015-10-07 LAB — CBC WITH DIFFERENTIAL/PLATELET
Basophils Absolute: 0 cells/uL (ref 0–200)
Basophils Relative: 0 %
EOS ABS: 50 {cells}/uL (ref 15–500)
Eosinophils Relative: 1 %
HCT: 37.3 % (ref 35.0–45.0)
HEMOGLOBIN: 12.3 g/dL (ref 11.7–15.5)
LYMPHS ABS: 2200 {cells}/uL (ref 850–3900)
LYMPHS PCT: 44 %
MCH: 28.6 pg (ref 27.0–33.0)
MCHC: 33 g/dL (ref 32.0–36.0)
MCV: 86.7 fL (ref 80.0–100.0)
MONO ABS: 450 {cells}/uL (ref 200–950)
MPV: 9.7 fL (ref 7.5–12.5)
Monocytes Relative: 9 %
NEUTROS PCT: 46 %
Neutro Abs: 2300 cells/uL (ref 1500–7800)
PLATELETS: 266 10*3/uL (ref 140–400)
RBC: 4.3 MIL/uL (ref 3.80–5.10)
RDW: 14.8 % (ref 11.0–15.0)
WBC: 5 10*3/uL (ref 3.8–10.8)

## 2015-10-07 LAB — COMPLETE METABOLIC PANEL WITH GFR
ALBUMIN: 4 g/dL (ref 3.6–5.1)
ALK PHOS: 79 U/L (ref 33–130)
ALT: 22 U/L (ref 6–29)
AST: 33 U/L (ref 10–35)
BUN: 15 mg/dL (ref 7–25)
CO2: 31 mmol/L (ref 20–31)
Calcium: 9.4 mg/dL (ref 8.6–10.4)
Chloride: 98 mmol/L (ref 98–110)
Creat: 0.86 mg/dL (ref 0.50–1.05)
GFR, EST AFRICAN AMERICAN: 86 mL/min (ref >=60)
GFR, EST NON AFRICAN AMERICAN: 74 mL/min (ref >=60)
GLUCOSE: 81 mg/dL (ref 65–99)
POTASSIUM: 3.5 mmol/L (ref 3.5–5.3)
SODIUM: 134 mmol/L — AB (ref 135–146)
TOTAL PROTEIN: 7.3 g/dL (ref 6.1–8.1)
Total Bilirubin: 0.4 mg/dL (ref 0.2–1.2)

## 2015-10-07 LAB — LIPID PANEL
Cholesterol: 177 mg/dL (ref 125–200)
HDL: 45 mg/dL — AB (ref >=46)
LDL Cholesterol: 107 mg/dL (ref <130)
Total CHOL/HDL Ratio: 3.9 Ratio (ref <=5.0)
Triglycerides: 126 mg/dL (ref <150)
VLDL: 25 mg/dL (ref <30)

## 2015-10-07 LAB — TSH: TSH: 2.84 m[IU]/L

## 2015-10-07 MED ORDER — TRIAMTERENE-HCTZ 37.5-25 MG PO TABS: 1.0000 | ORAL_TABLET | Freq: Every day | ORAL | 1 refills | Status: DC

## 2015-10-07 MED ORDER — SIMVASTATIN 40 MG PO TABS: 40.0000 mg | ORAL_TABLET | Freq: Every day | ORAL | 1 refills | Status: DC

## 2015-10-07 MED ORDER — AMLODIPINE BESYLATE 5 MG PO TABS: 5.0000 mg | ORAL_TABLET | Freq: Every day | ORAL | 1 refills | Status: DC

## 2015-10-07 NOTE — Assessment & Plan Note (Signed)
Atypical chest pain. Likely muscle strain vs anxiety. Offered EKG however patient declined.  -monitor symptoms -return precautions discussed

## 2015-10-07 NOTE — Assessment & Plan Note (Signed)
Due for TSH check. Check today and adjust synthroid if needed.

## 2015-10-07 NOTE — Progress Notes (Signed)
Social work consult from Dr. Ree Kida due to lack of food.  Call to patient she is employed with [employer] and has no income during the summer.  State she needs assist with paying rent and getting food.  Patient has connected with several resources in the community via phone but unable to get there due to no transportation.  CSW provided addition resources to apply for food stamps, rent assistance and food pantries.  Patient has no resources to get to pantry and DSS. Patient was appreciative of information.  Plan: CSW mailed patient food pantry books and resources for food as well as 4 bus passes for transportation to these locations.    Casimer Lanius, LCSW Licensed Clinical Social Worker [place]   [phone] 4:26 PM

## 2015-10-07 NOTE — Progress Notes (Signed)
Subjective:     Patient ID: Andrea [name], female   DOB: 25-Nov-1955, 60 y.o.   MRN: [id number]  Andrea [name] is a 60 year old female with a history of HTN, hypothyroidism, and HLD, presents to clinic today for follow-up.   Depression        Mood History of depression. She is currently on Zoloft which she says helps. Patient reports that she has been stressed out lately due to life changes regarding work and finances.  Denies suicidal ideations or thoughts of harming others.   HTN On triamterene-HCTZ and amlodipine. Patient reports that she inconsistently takes her blood pressure medications but will try to do better. Denies headache, N/V, vision changes, palpitations, chest pain, SOB.  Hypothyroidism Continues to take synthroid. She reports that she has had more energy overall and feels that she has lost weight. Endorses recent fatigue due to poor sleep.  Chest Pain Reports intermittent L chest pain that comes and goes away intermittently. Has occurring for months to years. She had a EKG done previously that came back normal. Describes as sharp sensation that only lasts a few seconds, not related to exertion, no sob, no radiation of symptoms.   Social - patient reports having been homeless for 7 months over the past year, currently has housing but finances are tight, has been getting some food from local pantry  Review of Systems  Psychiatric/Behavioral: Positive for depression.   Pertinent positives and negatives per HPI.    Vitals:   10/07/15 1124  BP: (!) 147/107  Pulse: 68  Temp: 98.3 F (36.8 C)   Objective:   Physical Exam  Constitutional: She appears well-developed and well-nourished. No distress.  Eyes: EOM are normal. Pupils are equal, round, and reactive to light. No scleral icterus.  Cardiovascular: Normal rate, regular rhythm and normal heart sounds.   No murmur heard. Pulmonary/Chest: Effort normal and breath sounds normal. She has no wheezes. She  exhibits no tenderness.  Abdominal: Soft. Bowel sounds are normal. There is no tenderness.  Psych: well dressed, affect is slightly blunted, mood is stated depressed, no SI, no HI, no tangential thoughts, no flight of ideas  GAD 7 score of 16 PHQ 9 score of 16    Assessment:    Andrea [name] is a 60 year old female with a history of HTN, hypothyroidism, and HLD, presents to clinic today for follow-up.    Plan:  HYPERTENSION, BENIGN SYSTEMIC Mildly elevated likely due to medication noncompliance (also did not take this AM) -no change in therapy -recommended checking blood pressures at home -set medication alarm in addition to home pill box -check basic labs -return in 3-4 weeks for BP check   Hypothyroidism Due for TSH check. Check today and adjust synthroid if needed.   Major depressive disorder, recurrent episode ([place]) Worsening depression and anxiety. Related to recent homelessness and financial strain.  -continue Zoloft 50 mg daily -will send note to Social Work to contact patient for assistance.   Atypical chest pain Atypical chest pain. Likely muscle strain vs anxiety. Offered EKG however patient declined.  -monitor symptoms -return precautions discussed

## 2015-10-07 NOTE — Assessment & Plan Note (Signed)
Worsening depression and anxiety. Related to recent homelessness and financial strain.  -continue Zoloft 50 mg daily -will send note to Social Work to contact patient for assistance.

## 2015-10-07 NOTE — Progress Notes (Deleted)
Subjective:     Patient ID: Andrea [name], female   DOB: 07-03-1955, 60 y.o.   MRN: [id number]  Andrea [name] is a 60 year old female with a history of HTN, hypothyroidism, and HLD, presents to clinic today for follow-up.  Mood History of depression. She is currently on Zoloft which she says helps. Patient reports that she has been stressed out lately due to life changes regarding work and finances.  Denies suicidal ideations or thoughts of harming others.   HTN On triamterene-HCTZ and amlodipine. Patient reports that she inconsistently takes her blood pressure medications but will try to do better. Denies headache, N/V, vision changes, palpitations, chest pain, SOB.  Hypothyroidism Continues to take synthroid. She reports that she has had more energy overall and feels that she has lost weight. Endorses recent fatigue due to poor sleep.  Chest Pain Reports intermittent L chest pain that comes and goes away intermittently. Being occurring for months to years. She had a EKG done previously that came back normal.   Review of Systems Pertinent positives and negatives per HPI.    Vitals:   10/07/15 1124  BP: (!) 147/107  Pulse: 68  Temp: 98.3 F (36.8 C)   Objective:   Physical Exam  Constitutional: She appears well-developed and well-nourished. No distress.  Eyes: EOM are normal. Pupils are equal, round, and reactive to light. No scleral icterus.  Cardiovascular: Normal rate, regular rhythm and normal heart sounds.   No murmur heard. Pulmonary/Chest: Effort normal and breath sounds normal. She has no wheezes. She exhibits no tenderness.  Abdominal: Soft. Bowel sounds are normal. There is no tenderness.      Assessment:    Andrea [name] is a 60 year old female with a history of HTN, hypothyroidism, and HLD, presents to clinic today for follow-up.    Plan:  Blood Pressure  - continue Norvasc and Triamterene-HCTZ, check your blood pressure at the pharmacy 2-3  times per week. Come back and see me for a blood pressure check in 3-4 weeks.  Mood -continue Zoloft, I will have my social worker meet with you today  Chest pain -likely related to muscle pain  -return to clinic if persists or worsens (will perform cardiac workup given age, HLD, HTN, and obesity)    Hypothyroidism -continue Synthroid

## 2015-10-07 NOTE — Assessment & Plan Note (Signed)
Mildly elevated likely due to medication noncompliance (also did not take this AM) -no change in therapy -recommended checking blood pressures at home -set medication alarm in addition to home pill box -check basic labs -return in 3-4 weeks for BP check

## 2015-10-07 NOTE — Patient Instructions (Signed)
It was nice to see you today.   Blood Pressure - continue Norvasc and Triamterene-HCTZ, check your blood pressure at the pharmacy 2-3 times per week. Come back and see me for a blood pressure check in 3-4 weeks.  Mood - continue Zoloft, I will have my social worker meet with you today  Chest pain - likely related to muscle pain however if it happens again or persists please call me to let me know.  Labs - Dr. Ree Kida will call you with your lab results.

## 2015-10-08 ENCOUNTER — Telehealth: Payer: Self-pay | Admitting: Family Medicine

## 2015-10-08 NOTE — Telephone Encounter (Signed)
Called patient to discuss lab results, no answer, left message that I would call back at later time.

## 2015-10-12 NOTE — Telephone Encounter (Signed)
Patient returned call. Discussed lab results.

## 2015-10-12 NOTE — Telephone Encounter (Signed)
Attempted to call patient, no answer, did not leave voicemail.

## 2015-10-14 ENCOUNTER — Telehealth: Payer: Self-pay | Admitting: Licensed Clinical Social Worker

## 2015-10-14 NOTE — Progress Notes (Signed)
Call from patient, informed CSW she received the bus passes that were mailed and was able to obtain food.  The following was discussed: Concern with no resources to pay rent, how patient was able to pay bills last year when school was out for the summer, options of changing pay to 12 months verses 9 months, financial counseling with [employer] of the [place].   CSW provided patient with phone numbers for additional community resources to call.  Patient was not interested in resources for financial counseling.  Plan: Patient will call agencies provided, she will change her pay status when she return to work and will work with her sister to assist her with future budgeting.     Casimer Lanius, LCSW Licensed Clinical Social Worker [place]   [phone] 2:00 PM

## 2015-12-02 ENCOUNTER — Other Ambulatory Visit: Payer: Self-pay | Admitting: Family Medicine

## 2015-12-31 ENCOUNTER — Ambulatory Visit: Payer: BC Managed Care – PPO | Admitting: Family Medicine

## 2016-01-07 ENCOUNTER — Encounter (INDEPENDENT_AMBULATORY_CARE_PROVIDER_SITE_OTHER): Payer: BC Managed Care – PPO | Admitting: Licensed Clinical Social Worker

## 2016-01-07 ENCOUNTER — Encounter: Payer: Self-pay | Admitting: Family Medicine

## 2016-01-07 ENCOUNTER — Ambulatory Visit (INDEPENDENT_AMBULATORY_CARE_PROVIDER_SITE_OTHER): Payer: BC Managed Care – PPO | Admitting: Family Medicine

## 2016-01-07 DIAGNOSIS — Z9114 Patient's other noncompliance with medication regimen: Secondary | ICD-10-CM

## 2016-01-07 DIAGNOSIS — I10 Essential (primary) hypertension: Secondary | ICD-10-CM | POA: Diagnosis not present

## 2016-01-07 DIAGNOSIS — F331 Major depressive disorder, recurrent, moderate: Secondary | ICD-10-CM | POA: Diagnosis not present

## 2016-01-07 DIAGNOSIS — Z91148 Patient's other noncompliance with medication regimen for other reason: Secondary | ICD-10-CM | POA: Insufficient documentation

## 2016-01-07 DIAGNOSIS — Z23 Encounter for immunization: Secondary | ICD-10-CM

## 2016-01-07 NOTE — Patient Instructions (Signed)
It was nice to see you today. I am sorry that you have so much going on in your life right now. If you ever need to just talk to someone please do not hesitate to call me at my office.   Please start to take your medications. As you are aware it is very important to take these every day.   Please return to see me in 2 weeks.

## 2016-01-07 NOTE — Progress Notes (Signed)
   Subjective:    Patient ID: Andrea [name], female    DOB: January 03, 1956, 60 y.o.   MRN: [id number]  HPI 60 y/o female presents for blood pressure check follow-up.   Hypertension Currently on Amlodipine 5 mg daily and Triamterene-HCTZ 37.5-25 mg daily. Not taking her medications (for the past week) as "too busy". Reports that she has no issues affording the medication.   Depression Currently on Zoloft 50 mg daily (has not taken for the past week), at last visit patient had many social issues (homelessness, trouble affording medications) affecting her mood. Contacted a number of resources given last visit but states "they were not helpful, no one had any funds available to help her." Reports that one resource [facility] ([place], [place], [place]) stated that they would be able to provide funds to help her and asked her to send them pay stubs and additional paperwork, however patient states that she has been calling every day for two week with no answer.   Social - currently has housing (her apartment) but states that she does not believe she will be able to stay much longer. Currently in an eviction legal battle. States she did not show up to her court date yesterday 01/06/2016.   Health Maintenance Due for Hep C and HIV  Review of Systems  Constitutional: Positive for fatigue.  Respiratory: Negative for cough, chest tightness, shortness of breath and wheezing.   Cardiovascular: Negative for chest pain, palpitations and leg swelling.  Gastrointestinal: Negative for abdominal pain and blood in stool.  Neurological: Negative for dizziness, syncope, weakness, numbness and headaches.  Psychiatric/Behavioral: Positive for dysphoric mood. Negative for self-injury and suicidal ideas.       Objective:   Physical Exam BP (!) 157/98   Pulse 70   Temp 98 F (36.7 C) (Oral)   Wt 252 lb (114.3 kg)   SpO2 98%   BMI 44.64 kg/m   Gen: Lethargic female. NAD.  Neuro: Alert and  interacting appropriately.  HEENT: Normocephalic. No extraoral swellings or cutaneous lesions present. EOMI. Nasal dorsum midline. No nasal discharge. Dentition grossly intact. No LAD. Trachea midline.  CV: Regular rate and rhythm. No murmurs. S1 and S2 present.  Pulm: Lungs CTAB. No wheezing, rhonchi or rales.  Ext: Warm and well perfused. No lower extremity edema.  Skin: No rash present. No cuts or abrasions.  Psych: dressed appropriately, affect is flat, mood is depressed, no tangential thoughts, no SI, no HI      Assessment & Plan:  HYPERTENSION, BENIGN SYSTEMIC Uncontrolled due to medication noncompliance.  -encouraged patient to take medications daily -motivational interviewing performed by Casimer Lanius CSW to increase compliance.  -recheck blood pressure in two weeks.   Major depressive disorder, recurrent episode ([place]) Patient continues to have a significant amount of depression that has been exacerbated by psychosocial issues.  -CSW met with patient to identify goals and provide support with financial stressors -encouraged patient to resume Zoloft 20 mg daily  Noncompliance with medications Due to social stressors and depression -support provided by physician, RN, and CSW

## 2016-01-07 NOTE — Assessment & Plan Note (Signed)
Due to social stressors and depression -support provided by physician, RN, and CSW

## 2016-01-07 NOTE — Progress Notes (Signed)
Patient ID: Andrea [name], female   DOB: 05-18-1955, 60 y.o.   MRN: [id number]  Warm handoff   SUBJECTIVE: Andrea [name] is a 60 y.o. female  referred by Dr. Ree Kida for:  depression and financial stressors. Discussed services offered by [facility], patient appreciative of support offered, verbal consent received.  Pt. reports the following symptoms/concerns fatigue, irritability, loss of interest in favorite activities and sleep disturbance.: Patient leaves home at 7:30AM to catch the bus to work and does not return home until 10:00pm.  She works this schedule five days a week and cares for her 62 year old granddaughter every weekend.  Patient failed to take any of her medication this week due to running late for work.  Duration of problem: several months.  Previous treatment/[facility]am[facility] HX: History of depression currently on Zoloft.  OBJECTIVE: Mood: Dysphoric & Affect: Tearful Risk of harm to self or others: patient denies  Assessments administered: PHQ=20 indication of severe depression Depression screen [facility] 2/9 01/07/2016 01/07/2016 09/07/2014  Decreased Interest 2 0 0  Down, Depressed, Hopeless 3 0 0  PHQ - 2 Score 5 0 0  Altered sleeping 3 - -  Tired, decreased energy 3 - -  Change in appetite 3 - -  Feeling bad or failure about yourself  3 - -  Trouble concentrating 0 - -  Moving slowly or fidgety/restless 3 - -  Suicidal thoughts 0 - -  PHQ-9 Score 20 - -  Difficult doing work/chores Very difficult - -   LIFE CONTEXT:  Living situation: alone in an apartment since May 2017.  Was homeless prior to May. Occupation: Employed with [employer] 18 years Family & Social: family local ( daughter, Technical sales engineer and sister,no friends)  Self-Care: no Exercise, or outlet for fun.Goes to church when she can.  Life changes: no recent life changes.  Patient still adjusting to previous financial hardship when she was homeless. What is important to pt (values): "being able to get some  rest".  THE FOLLOW WAS DISCUSSED:Current stressors, past, current and new coping skills, treatment plan and goals.    GOALS ADDRESSED: Top three stressors Dispensing optician, Family, Work) and how to relax   INTERVENTIONS UTILIZED TODAY: Problem-solving teaching/coping strategies, Solution Focus, Reflective listening, and Relaxation techniques.    ASSESSMENT/PLAN:  Pt currently experiencing depression. Symptoms exacerbated by psychosocial stressors associated with financial hardship, housing, and family.  Pt may benefit from and is in agreement to receive further assessment and therapeutic interventions with LCSW. Patient will work on the following:relaxed breathing, start a journal,  Identify one thing she can do to decrease stress with her family.  Referral: Patient will contact [employer] for assistance with locating affordable housing and assistance to avoid eviction.      1. Patient will  F/U with LCSW when she return to see MD. 2. LCSW will F/U with patient via phone call in one week. 3. Patient is in agreement to implement interventions and make contact with resources provided  4. Behavioral Health meds: Patient will continue taking Zoloft as prescribed by MD. 5. On a scale of 1-10, how likely are you to follow plan: Andrea Di­az, LCSW Licensed Clinical Social Worker [place]   [phone] 2:19 PM

## 2016-01-07 NOTE — Assessment & Plan Note (Signed)
Patient continues to have a significant amount of depression that has been exacerbated by psychosocial issues.  -CSW met with patient to identify goals and provide support with financial stressors -encouraged patient to resume Zoloft 20 mg daily

## 2016-01-07 NOTE — Assessment & Plan Note (Signed)
Uncontrolled due to medication noncompliance.  -encouraged patient to take medications daily -motivational interviewing performed by Casimer Lanius CSW to increase compliance.  -recheck blood pressure in two weeks.

## 2016-01-14 ENCOUNTER — Telehealth: Payer: Self-pay | Admitting: Licensed Clinical Social Worker

## 2016-01-14 NOTE — Progress Notes (Signed)
Follow up call to patient from [facility] consult on 01/07/16.   Patient is pleasant and very talkative.  States she is feeling "pretty good" , indicated this is due to getting closer to resolving some of her psychosocial stressors (housing and her family).  Patient implemented the interventions discussed with LCSW and reports that she is taking all medications daily.  Patient has a medical follow up appointment on 01/28/16.   Plan: Patient states she may ask to see LCSW after the appointment.  Casimer Lanius, LCSW Licensed Clinical Social Worker [place]   [phone] 3:10 PM

## 2016-01-28 ENCOUNTER — Encounter: Payer: Self-pay | Admitting: Internal Medicine

## 2016-01-28 ENCOUNTER — Encounter (INDEPENDENT_AMBULATORY_CARE_PROVIDER_SITE_OTHER): Payer: BC Managed Care – PPO | Admitting: Licensed Clinical Social Worker

## 2016-01-28 ENCOUNTER — Ambulatory Visit (INDEPENDENT_AMBULATORY_CARE_PROVIDER_SITE_OTHER): Payer: BC Managed Care – PPO | Admitting: Internal Medicine

## 2016-01-28 ENCOUNTER — Ambulatory Visit: Payer: BC Managed Care – PPO | Admitting: Family Medicine

## 2016-01-28 VITALS — BP 145/102 | HR 74 | Temp 98.4°F | Ht 63.0 in | Wt 254.4 lb

## 2016-01-28 DIAGNOSIS — R5383 Other fatigue: Secondary | ICD-10-CM

## 2016-01-28 DIAGNOSIS — F331 Major depressive disorder, recurrent, moderate: Secondary | ICD-10-CM

## 2016-01-28 DIAGNOSIS — I10 Essential (primary) hypertension: Secondary | ICD-10-CM

## 2016-01-28 DIAGNOSIS — Z1159 Encounter for screening for other viral diseases: Secondary | ICD-10-CM | POA: Diagnosis not present

## 2016-01-28 DIAGNOSIS — Z114 Encounter for screening for human immunodeficiency virus [HIV]: Secondary | ICD-10-CM

## 2016-01-28 NOTE — Progress Notes (Signed)
[name] Progress Note  Subjective:  Andrea [name] is a 60 y.o. female who presents for follow-up of hypertension and mood.  HTN:  - Takes norvasc 5 mg, triamterene-hctz 37.5-25 mg - Missed doses this weekend, as difficult to remember when watching her 3-yo granddaughter - Does not watch salt intake and eats chips regularly - Thinks taking medication first thing in the morning would be easiest for her ROS: Denies chest pain, vision changes, HA  Depression: - Says she has been taking zoloft 150 mg daily and has not been missing doses since last visit - Fatigue is her biggest symptom, thinks mood is otherwise okay - Had normal TSH and CBC 10/07/15 - Does not feel like her daughter appreciates her taking care of granddaughter on weekends - Had followed up with LCSW Casimer Lanius by phone since last visit ROS: No SI/HI  Chief Complaint  Patient presents with  . Hypertension    Past Medical History:  Diagnosis Date  . Carpal tunnel syndrome 04/26/2006   Qualifier: Diagnosis of  By: Dorathy Daft MD, Marjory Lies    . GANGLION CYST, WRIST, RIGHT 10/06/2008   Qualifier: Diagnosis of  By: Drue Flirt  MD, Merrily Brittle     Social: Never smoker  No Known Allergies  Objective: Blood pressure (!) 145/102, pulse 74, temperature 98.4 F (36.9 C), temperature source Oral, height 5\' 3"  (1.6 m), weight 254 lb 6.4 oz (115.4 kg). Repeat BP 138/104. Body mass index is 45.06 kg/m. Constitutional: Morbidly obese female in NAD Cardiovascular: RRR, S1, S2, no m/r/g.  Pulmonary/Chest: Effort normal and breath sounds normal. No respiratory distress.  Psychiatric: Flat affect.  Vitals reviewed  Depression screen [facility] 2/9 01/28/2016 01/28/2016 01/07/2016 01/07/2016 09/07/2014  Decreased Interest 1 1 2  0 0  Down, Depressed, Hopeless 2 2 3  0 0  PHQ - 2 Score 3 3 5  0 0  Altered sleeping 2 2 3  - -  Tired, decreased energy 2 2 3  - -  Change in appetite 1 1 3  - -  Feeling bad or failure about  yourself  2 2 3  - -  Trouble concentrating 0 0 0 - -  Moving slowly or fidgety/restless 3 3 3  - -  Suicidal thoughts 0 0 0 - -  PHQ-9 Score 13 13 20  - -  Difficult doing work/chores Somewhat difficult - Very difficult - -    Assessment/Plan: HYPERTENSION, BENIGN SYSTEMIC - Above goal of 140/90 in setting of having missed recent doses. - Asked patient to return for BP recheck with nursing visit in 1-2 weeks. - Counseled on importance of watching salt intake and provided handout on low salt diet - Anticipate needing to increase norvasc to 10 mg at follow-up  Major depressive disorder, recurrent episode ([facility]) - PHQ-9 score improved from severe to moderate - Does not feel she has time to see a counselor regularly - Could increase zoloft to 200 mg daily but just restarted taking it on a regular basis so would reassess at next visit - Will obtain Vit D level given continued complaint of fatigue and also reports R leg pains  Follow-up in 1-2 weeks for nursing visit for BP recheck. Would increase norvasc to 10 mg if still elevated.   Olene Floss, MD [place], PGY-2

## 2016-01-28 NOTE — Patient Instructions (Signed)
Andrea [name],  Thank you for coming in today.  Please make a nursing appointment for blood pressure check. If this is high, we will need to talk about increasing your medications. Watching salt intake can also help.  Please take prozac regularly and do not skip days to avoid side effects.  Best, Dr. Ola Spurr  Cooking With Less [employer] with less salt is one way to reduce the amount of sodium you get from food. Sodium raises blood pressure and causes water to be held in the body. Getting less sodium from food may help lower your blood pressure, reduce any swelling, and protect your heart, liver, and kidneys. What do I need to know about cooking with less salt?  Buy sodium-free or low-sodium products. Look on the label for the words:  Low-sodium.  Sodium-free.  Reduced-sodium.  No salt added.  Unsalted.  Check the food label before using or buying packaged ingredients.  Look for products with no more than 150 mg of sodium in one serving.  Do not choose foods with salt as one of the first three ingredients on the ingredients list. If salt is one of the first three ingredients, it usually means the item is high in sodium because ingredients are listed in order of amount in the food item.  Use herbs, seasonings without salt, and spices as substitutes for salt in foods.  Use sodium-free baking soda when baking. What are some salt alternatives? The following are herbs, seasonings, and spices that can be used instead of salt to give taste to your food. Next to their names are foods they can be used to flavor. Herbs  Bay leaves-Soups, meat and vegetable dishes, and spaghetti sauce.  Basil-Italian dishes, soups, pasta, and fish dishes.  Cilantro-Meat, poultry, and vegetable dishes.  Chili Powder-Marinades and Mexican dishes.  Chives-Salad dressings and potato dishes.  Cumin-Mexican dishes, couscous, and meat dishes.  Dill-Fish dishes, sauces, and salads.  Fennel-Meat  and vegetable dishes, breads, and cookies.  Garlic (do not use garlic salt)-Italian dishes, meat dishes, salad dressings, and sauces.  Marjoram-Soups, potato dishes, and meat dishes.  Oregano-Pizza and spaghetti sauce.  Parsley-Salads, soups, pasta, and meat dishes.  Rosemary-Italian dishes, salad dressings, soups, and red meats.  Saffron-Fish dishes, pasta, and some poultry dishes.  Sage-Stuffings and sauces.  Tarragon-Fish and [employer].  Thyme-Stuffing, meat, and fish dishes. Herbs should be fresh or dried. Do not choose packaged mixes. Seasonings  Lemon juice-Fish dishes, poultry dishes, vegetables, and salads.  Vinegar-Salad dressings, vegetables, and fish dishes. Spices  Cinnamon-Sweet dishes (such as cakes, cookies, and puddings).  Cloves-Gingerbread, puddings, and marinades for meats.  Curry-Vegetable dishes, fish and poultry dishes, and stir-fry dishes.  Ginger-Vegetables dishes, fish dishes, and stir-fry dishes.  Nutmeg-Pasta, vegetables, poultry, fish dishes, and custard. What are some low-sodium ingredients and foods?  Fresh or frozen fruits and vegetables with no sauce added.  Fresh or frozen whole meats, poultry, and fish with no sauce added.  Eggs.  Noodles, pasta, quinoa, rice.  Shredded or puffed wheat or puffed rice.  Regular or quick oats.  Milk, yogurt, hard cheeses, and low-sodium cheeses. Good cheese choices include Swiss, [place]. Always check the label for serving size and sodium content.  Unsalted butter or margarine.  Unsalted nuts.  Sherbet or ice cream (keep to  cup serving).  Homemade pudding.  Sodium-free baking soda and baking powder. This is not a complete list of low-sodium ingredients and foods. Contact your dietitian for more options.  What  high-sodium ingredients are not recommended?  Sauces, such as mustard, barbecue sauce, soy sauce, teriyaki sauce, steak sauce, chili sauce, cocktail  sauce, and tartar sauce.  Mixes, such as flavored rice.  Instant products, such as ready-made pasta.  Horseradish.  Salsa.  Packaged gravies.  Angie Fava.  Olives.  Sauerkraut.  Salted nuts.  Cured or smoked meats (such as hot dogs, bacon, salami, ham, and bologna).  Processed vegetable juices, such as tomato juice.  Buttermilk.  Processed cheeses (such as cheese dips or cheese spread).  Cottage cheese.  Instant hot cereals.  Dessert mixes (ready-to-make) and store-bought cakes and pies.  Crackers with salted tops. This is not a complete list of high-sodium ingredients. Contact your dietitian for more options.  This information is not intended to replace advice given to you by your health care provider. Make sure you discuss any questions you have with your health care provider. Document Released: 02/13/2005 Document Revised: 07/22/2015 Document Reviewed: 01/06/2013 Elsevier Interactive Patient Education  2017 [employer].

## 2016-01-28 NOTE — Progress Notes (Addendum)
  Reason for follow-up: Continue brief intervention to address symptoms associated with Depression. Patient reports continued impairment in her daily functions with being tired and poor sleep.  She continues to utilize relaxation techniques and puzzles, states overall she is feeling a little better. PHQ-9 score is 13 which is an indication of mild depression. Depression screen [facility] 2/9 01/28/2016 01/28/2016 01/07/2016  Decreased Interest 1 1 2   Down, Depressed, Hopeless 2 2 3   PHQ - 2 Score 3 3 5   Altered sleeping 2 2 3   Tired, decreased energy 2 2 3   Change in appetite 1 1 3   Feeling bad or failure about yourself  2 2 3   Trouble concentrating 0 0 0  Moving slowly or fidgety/restless 3 3 3   Suicidal thoughts 0 0 0  PHQ-9 Score 13 13 20   Difficult doing work/chores Somewhat difficult - Very difficult  Patient is making progress in the right direction.   This is evident by decrease in PHQ-9 score above from 20 to 13.   Identified goals Manager stressors associated with family, work and financial  Issues discussed: utilization of relaxation tech, problem solving with stressors and importance of self-care.    INTERVENTIONS UTILIZED TODAY:  , Problem-solving teaching/coping strategies Solution Focus,  Reflective listening.   Referral: no referrals today    PLAN: 1. Patient will continue with relaxation interventions 2. Patient will enjoy her self-care day planned for today and try to schedule at self-care a few hours a week 3. Patient will utilize problem solving to manage family stressors 4. Patient will follow up with LCSW if needed.  Casimer Lanius, LCSW Licensed Clinical Social Worker [place]   [phone] 11:59 AM

## 2016-01-29 LAB — HEPATITIS C ANTIBODY: HCV Ab: NEGATIVE

## 2016-01-29 LAB — HIV ANTIBODY (ROUTINE TESTING W REFLEX): HIV: NONREACTIVE

## 2016-01-29 LAB — VITAMIN D 25 HYDROXY (VIT D DEFICIENCY, FRACTURES): Vit D, 25-Hydroxy: 10 ng/mL — ABNORMAL LOW (ref 30–100)

## 2016-01-30 NOTE — Assessment & Plan Note (Addendum)
-   PHQ-9 score improved from severe to moderate - Does not feel she has time to see a counselor regularly - Could increase zoloft to 200 mg daily but just restarted taking it on a regular basis so would reassess at next visit - Will obtain Vit D level given continued complaint of fatigue and also reports R leg pains

## 2016-01-30 NOTE — Assessment & Plan Note (Signed)
-   Above goal of 140/90 in setting of having missed recent doses. - Asked patient to return for BP recheck with nursing visit in 1-2 weeks. - Counseled on importance of watching salt intake and provided handout on low salt diet - Anticipate needing to increase norvasc to 10 mg at follow-up

## 2016-02-06 ENCOUNTER — Other Ambulatory Visit: Payer: Self-pay | Admitting: Internal Medicine

## 2016-02-06 ENCOUNTER — Encounter: Payer: Self-pay | Admitting: Internal Medicine

## 2016-02-06 DIAGNOSIS — E559 Vitamin D deficiency, unspecified: Secondary | ICD-10-CM

## 2016-02-06 MED ORDER — VITAMIN D (ERGOCALCIFEROL) 1.25 MG (50000 UNIT) PO CAPS: 50000.0000 [IU] | ORAL_CAPSULE | ORAL | 0 refills | Status: DC

## 2016-02-17 ENCOUNTER — Other Ambulatory Visit: Payer: Self-pay | Admitting: Family Medicine

## 2016-02-17 DIAGNOSIS — I10 Essential (primary) hypertension: Secondary | ICD-10-CM

## 2016-04-25 NOTE — Progress Notes (Signed)
   Subjective:    Patient ID: Andrea [name], female    DOB: 31-Jan-1956, 61 y.o.   MRN: [id number]  HPI 61 y/o female presents for routine follow up.  HTN Last evaluated by Dr. Ola Spurr on 01/28/16. Currently prescribed Norvasc 5 mg daily and Triamterene-HCTZ 37.5-25 mg daily, elevated at last visit due to medication noncompliance. Reports taking daily, uses pill box. No current chest pain (did have one episode of chest a few months ago).   Depression Prescribed Zoloft 150 mg daily, takes most days, reports doing better, has been doing crossword puzzles, working on a blanket for sister's dog. No SI, no HI.   Left Arm Feels "heavy at times", occasional pain in shoulder that radiates to elbow, intermittent for two months, she did fall on the bus when symptoms started (fell on left side). Has not been taking any otc meds or applying heat/ice.   Right Wrist Pain Intermittent pain, mostly at work, does not wear brace, has not applied heat/ice or taking otc meds  HM Up to date on health maintenance  Social  Has a stable living situation (still owes like $100 on back rent)  Review of Systems See above.     Objective:   Physical Exam BP (!) 144/94   Pulse 73   Temp 97.6 F (36.4 C) (Oral)   Ht 5\' 3"  (1.6 m)   Wt 263 lb 9.6 oz (119.6 kg)   SpO2 95%   BMI 46.69 kg/m   Gen: pleasant female, NAD HEENT: normocephalic, PERRL, EOMI, no scleral icterus, neck supple, no thyromegaly Cardiac: RRR, S1 and S2 present, no murmur Resp: CTAB, normal effort Ext: no edema Psych: well groomed, affect is outgoing, mood is happy, no SI, no HI MSK: Cervical - no tenderness, ROM is full, negative Spurling's; Left Shoulder - no tenderness, 5/5 strength to empty can/lift off/ER, negative Hawking's, Neer's, Speeds; Left elbow - tenderness over lateral biceps/triceps, no elbow pain/swelling, strength flexion/extension 5/5; Left wrist - no swelling/tenderenss, strength wrist flexion/extension 5/5;  right wrist - minimal dorsal wrist pain, no swelling or erythema, strength wrist flexion/extesion 5/5  Skin: no rash, no bruising     Assessment & Plan:  HYPERTENSION, BENIGN SYSTEMIC Mildly elevated. Suspect component of white coat hypertension. -continue Norvasc 5 mg and Triamterene-HCTZ 37.5-25 mg daily -patient to check BP at home and bring in values to next visit  Major depressive disorder, recurrent episode ([place]) Symptoms improved.  -continue Zoloft 150 mg daily  Arm pain, lateral, left Left lateral arm pain (primarily lateral biceps/triceps). Unremarkable cervical/shoulder/elbow otherwise. -trial of heat/ice to area -provided home exercise routine for rotator cuff injuries -consider imaging if symptoms persist  Right wrist pain Suspect soft tissue overuse injury. -apply heat/ice as tolerated -wear otc wrist brace during work

## 2016-04-27 ENCOUNTER — Ambulatory Visit (INDEPENDENT_AMBULATORY_CARE_PROVIDER_SITE_OTHER): Payer: BC Managed Care – PPO | Admitting: Family Medicine

## 2016-04-27 DIAGNOSIS — I10 Essential (primary) hypertension: Secondary | ICD-10-CM | POA: Diagnosis not present

## 2016-04-27 DIAGNOSIS — M25531 Pain in right wrist: Secondary | ICD-10-CM

## 2016-04-27 DIAGNOSIS — F331 Major depressive disorder, recurrent, moderate: Secondary | ICD-10-CM

## 2016-04-27 DIAGNOSIS — M79602 Pain in left arm: Secondary | ICD-10-CM | POA: Diagnosis not present

## 2016-04-27 MED ORDER — BLOOD PRESSURE MONITOR/L CUFF MISC: 0 refills | Status: DC

## 2016-04-27 NOTE — Patient Instructions (Addendum)
It was nice to see you today.  Blood pressure - better today,  Continue current blood pressure medications, check blood pressures at home 3 times per week (please keep a log)  Call me if your blood pressures at above the 150/90 mark.   Mood - I am glad to hear that you are going better  Left arm - ice 2-3 times per day (10 minutes at a time), please perform home exercises.   Return in 3 months for blood pressure check.

## 2016-04-28 ENCOUNTER — Encounter: Payer: Self-pay | Admitting: Family Medicine

## 2016-04-28 ENCOUNTER — Telehealth: Payer: Self-pay | Admitting: Family Medicine

## 2016-04-28 DIAGNOSIS — M25531 Pain in right wrist: Secondary | ICD-10-CM | POA: Insufficient documentation

## 2016-04-28 DIAGNOSIS — M79602 Pain in left arm: Secondary | ICD-10-CM

## 2016-04-28 HISTORY — DX: Pain in right wrist: M25.531

## 2016-04-28 HISTORY — DX: Pain in left arm: M79.602

## 2016-04-28 NOTE — Telephone Encounter (Signed)
Were you sending in an rx for a BP monitor for this patient?

## 2016-04-28 NOTE — Telephone Encounter (Signed)
Message addendum - mail was gone for the day, needed to send prescription in another envelope. Likely will go out on Monday.

## 2016-04-28 NOTE — Assessment & Plan Note (Signed)
Suspect soft tissue overuse injury. -apply heat/ice as tolerated -wear otc wrist brace during work

## 2016-04-28 NOTE — Telephone Encounter (Signed)
RN staff - inform her that she needs a paper script, I forgot to do this during her visit, I will mail one to her with the other paperwork that I completed.

## 2016-04-28 NOTE — Assessment & Plan Note (Signed)
Left lateral arm pain (primarily lateral biceps/triceps). Unremarkable cervical/shoulder/elbow otherwise. -trial of heat/ice to area -provided home exercise routine for rotator cuff injuries -consider imaging if symptoms persist

## 2016-04-28 NOTE — Progress Notes (Signed)
Completed [employer] insurance forms for patient. Mailed 04/28/16.

## 2016-04-28 NOTE — Assessment & Plan Note (Signed)
Symptoms improved.  -continue Zoloft 150 mg daily

## 2016-04-28 NOTE — Assessment & Plan Note (Signed)
Mildly elevated. Suspect component of white coat hypertension. -continue Norvasc 5 mg and Triamterene-HCTZ 37.5-25 mg daily -patient to check BP at home and bring in values to next visit

## 2016-04-28 NOTE — Telephone Encounter (Signed)
Pt called because she went to the pharmacy to pick up her BP monitor but the pharmacy said they never received a prescription from [place]. Can we call and see what the problem is. Andrea [name]

## 2016-05-01 NOTE — Telephone Encounter (Signed)
Pt informed and was told to wait a couple days before it came in. Deseree Kennon Holter, CMA

## 2016-07-04 ENCOUNTER — Other Ambulatory Visit: Payer: Self-pay | Admitting: Family Medicine

## 2016-07-11 NOTE — Progress Notes (Signed)
   Subjective:    Patient ID: Andrea [name], female    DOB: 04-15-1955, 61 y.o.   MRN: [id number]  HPI 61 y/o female presents for follow up of hypertension and MSK issues.   HTN Prescribed Norvasc 5 mg and Triamterene-HCTZ 37.5-25 mg daily, reports compliance, rare headaches associated with stress, no changes in vision, no chest pain, occasional lightheadedness (2 times per week).   Lateral arm pain Evaluated at last visit on 04/27/16. Prescribed home exercises and trial heat/ice. Resolved.    Right wrist pain Evaluated last visit on 04/27/16. Wears otc wrist brace during work. Resolved.   HM Due for Tdap   Review of Systems See above    Objective:   Physical Exam BP 130/82   Pulse 68   Temp 98.2 F (36.8 C) (Oral)   Ht 5\' 3"  (1.6 m)   Wt 263 lb (119.3 kg)   SpO2 97%   BMI 46.59 kg/m   Gen: pleasant female, NAD Cardiac: RRR, S1 and S2 present, no murmur Resp: CTAB, normal effort Ext: trace edema      Assessment & Plan:  HYPERTENSION, BENIGN SYSTEMIC Controlled.  -continue Norvasc and Triamterene-HCTZ  Arm pain, lateral, left Symptoms resolved.   Right wrist pain Symptoms resolved.   Healthcare maintenance Tdap provided in office today.

## 2016-07-13 ENCOUNTER — Other Ambulatory Visit: Payer: Self-pay | Admitting: Family Medicine

## 2016-07-13 ENCOUNTER — Encounter: Payer: Self-pay | Admitting: Family Medicine

## 2016-07-13 ENCOUNTER — Ambulatory Visit (INDEPENDENT_AMBULATORY_CARE_PROVIDER_SITE_OTHER): Payer: BC Managed Care – PPO | Admitting: Family Medicine

## 2016-07-13 VITALS — BP 130/82 | HR 68 | Temp 98.2°F | Ht 63.0 in | Wt 263.0 lb

## 2016-07-13 DIAGNOSIS — Z23 Encounter for immunization: Secondary | ICD-10-CM | POA: Diagnosis not present

## 2016-07-13 DIAGNOSIS — M79602 Pain in left arm: Secondary | ICD-10-CM | POA: Diagnosis not present

## 2016-07-13 DIAGNOSIS — Z0000 Encounter for general adult medical examination without abnormal findings: Secondary | ICD-10-CM | POA: Diagnosis not present

## 2016-07-13 DIAGNOSIS — I10 Essential (primary) hypertension: Secondary | ICD-10-CM | POA: Diagnosis not present

## 2016-07-13 DIAGNOSIS — M25531 Pain in right wrist: Secondary | ICD-10-CM | POA: Diagnosis not present

## 2016-07-13 NOTE — Assessment & Plan Note (Signed)
Tdap provided in office today.

## 2016-07-13 NOTE — Assessment & Plan Note (Signed)
Controlled.  -continue Norvasc and Triamterene-HCTZ

## 2016-07-13 NOTE — Assessment & Plan Note (Signed)
Symptoms resolved 

## 2016-07-13 NOTE — Patient Instructions (Signed)
It was nice to see you today.  Your blood pressure is controlled. Please continue your current medications.  You were given a tetanus shot today.

## 2016-07-14 ENCOUNTER — Ambulatory Visit: Payer: BC Managed Care – PPO | Admitting: Family Medicine

## 2016-08-15 ENCOUNTER — Other Ambulatory Visit: Payer: Self-pay | Admitting: Family Medicine

## 2016-09-15 ENCOUNTER — Ambulatory Visit (INDEPENDENT_AMBULATORY_CARE_PROVIDER_SITE_OTHER): Payer: BC Managed Care – PPO | Admitting: Family Medicine

## 2016-09-15 VITALS — BP 102/60 | HR 71 | Temp 98.0°F | Wt 260.0 lb

## 2016-09-15 DIAGNOSIS — M546 Pain in thoracic spine: Secondary | ICD-10-CM | POA: Diagnosis not present

## 2016-09-15 MED ORDER — MELOXICAM 7.5 MG PO TABS: 7.5000 mg | ORAL_TABLET | Freq: Every day | ORAL | 0 refills | Status: DC

## 2016-09-15 NOTE — Assessment & Plan Note (Signed)
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  Pain for two weeks without rash reassuring that it is likely not shingles.  Prescribed meloxicam 7.5 mg daily, warm compresses, and support belt if patient feels that this is necessary.  Advised to return if numbness, focal weakness, or rash develops.  Advised to return if pain worsens.

## 2016-09-15 NOTE — Patient Instructions (Addendum)
Please take one tablet of meloxicam each day for your side and shoulder pain.  A heating pad can also help your pain, but only use this when you are not sleeping.  You can consider buying a lower back support belt from your pharmacy to wrap around your torso, but if this is too difficult to find or too expensive, you do not have to buy it.    Please return to clinic if your pain worsens, if you develop a rash in the area of your pain, or if you develop any numbness or weakness.    Please schedule a mammogram at your earliest convenience.

## 2016-09-15 NOTE — Progress Notes (Signed)
   Subjective:    Andrea [name] - 61 y.o. female MRN [id number]  Date of birth: 1955-04-23  HPI  Andrea [name] is here for pain in her right side.    Pain on right side This pain developed gradually starting about two weeks ago along her R side (T7-T12 region) and has worsened since then.  She believes it is muscular in nature.  It is exacerbated by going over bumpy roads and by walking long distances.  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  Her pain is worse at the end of the day. She has tried stretching, but this has not helped.  She has not tried any medications for relief.     Health Maintenance:  Health Maintenance Due  Topic Date Due  . MAMMOGRAM  08/27/2016    -  reports that she has never smoked. She does not have any smokeless tobacco history on file. - Review of Systems: Per HPI. - Past Medical History: Patient Active Problem List   Diagnosis Date Noted  . Acute right-sided thoracic back pain 09/15/2016  . Healthcare maintenance 07/13/2016  . Noncompliance with medications 01/07/2016  . Atypical chest pain 10/07/2015  . Snoring 09/07/2014  . Middle ear effusion 09/07/2014  . Abnormal mammogram 08/28/2014  . Allergic rhinitis 06/22/2014  . Hypokalemia 05/27/2014  . Palpitations 09/22/2013  . First degree AV block 09/22/2013  . Cataract 09/19/2013  . Myalgia 02/03/2011  . Hypothyroidism 04/26/2006  . HYPERLIPIDEMIA 04/26/2006  . Severe obesity (BMI >= 40) ([place]) 04/26/2006  . Major depressive disorder, recurrent episode ([place]) 04/26/2006  . HYPERTENSION, BENIGN SYSTEMIC 04/26/2006   - Medications: reviewed and updated   Objective:   Physical Exam BP 102/60   Pulse 71   Temp 98 F (36.7 C) (Oral)   Wt 260 lb (117.9 kg)   BMI 46.06 kg/m  Gen: NAD, alert, cooperative with exam, well-appearing HEENT: NCAT, clear conjunctiva, supple neck CV: RRR, good S1/S2, no murmur, no edema, capillary refill  brisk  Resp: CTABL, no wheezes, non-labored MSK: Tender to palpation along T7-T12 region of right side.  Normal ROM without pain.  No lesions noted along area. Abd: SNTND, BS present, no guarding or organomegaly Skin: no rashes along tender area, normal turgor  Neuro: no gross deficits.  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  Pain for two weeks without rash reassuring that it is likely not shingles.  Prescribed meloxicam 7.5 mg daily, warm compresses, and support belt if patient feels that this is necessary.  Advised to return if numbness, focal weakness, or rash develops.  Advised to return if pain worsens.  Health Maintenance: Patient given form for mammogram and encouraged to make an appointment at her earliest convenience.  Maia Breslow, M.D. 09/15/2016, 2:41 PM PGY-1, [place]

## 2016-10-06 ENCOUNTER — Other Ambulatory Visit: Payer: Self-pay | Admitting: *Deleted

## 2016-10-06 MED ORDER — SIMVASTATIN 40 MG PO TABS: 40.0000 mg | ORAL_TABLET | Freq: Every day | ORAL | 1 refills | Status: DC

## 2016-11-16 ENCOUNTER — Other Ambulatory Visit: Payer: Self-pay | Admitting: *Deleted

## 2016-11-16 NOTE — Telephone Encounter (Signed)
Patient has refill till Feb 2019. Please have her contact her pharmacy first.

## 2016-11-20 ENCOUNTER — Other Ambulatory Visit: Payer: Self-pay | Admitting: *Deleted

## 2016-11-20 MED ORDER — SERTRALINE HCL 50 MG PO TABS: 150.0000 mg | ORAL_TABLET | Freq: Every day | ORAL | 3 refills | Status: DC

## 2016-11-20 MED ORDER — TRIAMTERENE-HCTZ 37.5-25 MG PO TABS: 1.0000 | ORAL_TABLET | Freq: Every day | ORAL | 0 refills | Status: DC

## 2016-12-04 ENCOUNTER — Other Ambulatory Visit: Payer: Self-pay | Admitting: Family Medicine

## 2016-12-04 DIAGNOSIS — R921 Mammographic calcification found on diagnostic imaging of breast: Secondary | ICD-10-CM

## 2016-12-29 ENCOUNTER — Ambulatory Visit: Admission: RE | Admit: 2016-12-29 | Payer: BC Managed Care – PPO | Source: Ambulatory Visit

## 2016-12-29 ENCOUNTER — Ambulatory Visit
Admission: RE | Admit: 2016-12-29 | Discharge: 2016-12-29 | Disposition: A | Discharge status: Home / Self Care | Payer: BC Managed Care – PPO | Source: Ambulatory Visit | Attending: Family Medicine | Admitting: Family Medicine

## 2016-12-29 DIAGNOSIS — R921 Mammographic calcification found on diagnostic imaging of breast: Secondary | ICD-10-CM

## 2017-01-12 ENCOUNTER — Ambulatory Visit: Payer: BC Managed Care – PPO | Admitting: Family Medicine

## 2017-01-12 ENCOUNTER — Encounter: Payer: Self-pay | Admitting: Family Medicine

## 2017-01-12 ENCOUNTER — Ambulatory Visit (HOSPITAL_COMMUNITY)
Admission: RE | Admit: 2017-01-12 | Discharge: 2017-01-12 | Disposition: A | Discharge status: Home / Self Care | Payer: BC Managed Care – PPO | Source: Ambulatory Visit | Attending: Family Medicine | Admitting: Family Medicine

## 2017-01-12 ENCOUNTER — Other Ambulatory Visit: Payer: Self-pay

## 2017-01-12 VITALS — BP 132/80 | HR 79 | Temp 98.7°F | Ht 63.0 in | Wt 261.0 lb

## 2017-01-12 DIAGNOSIS — Z23 Encounter for immunization: Secondary | ICD-10-CM | POA: Diagnosis not present

## 2017-01-12 DIAGNOSIS — I491 Atrial premature depolarization: Secondary | ICD-10-CM

## 2017-01-12 DIAGNOSIS — F331 Major depressive disorder, recurrent, moderate: Secondary | ICD-10-CM

## 2017-01-12 DIAGNOSIS — H9202 Otalgia, left ear: Secondary | ICD-10-CM

## 2017-01-12 DIAGNOSIS — R2689 Other abnormalities of gait and mobility: Secondary | ICD-10-CM | POA: Diagnosis not present

## 2017-01-12 DIAGNOSIS — E785 Hyperlipidemia, unspecified: Secondary | ICD-10-CM

## 2017-01-12 DIAGNOSIS — R002 Palpitations: Secondary | ICD-10-CM | POA: Insufficient documentation

## 2017-01-12 DIAGNOSIS — E559 Vitamin D deficiency, unspecified: Secondary | ICD-10-CM | POA: Diagnosis not present

## 2017-01-12 DIAGNOSIS — R9431 Abnormal electrocardiogram [ECG] [EKG]: Secondary | ICD-10-CM | POA: Diagnosis not present

## 2017-01-12 DIAGNOSIS — I10 Essential (primary) hypertension: Secondary | ICD-10-CM | POA: Diagnosis not present

## 2017-01-12 DIAGNOSIS — E039 Hypothyroidism, unspecified: Secondary | ICD-10-CM | POA: Diagnosis not present

## 2017-01-12 DIAGNOSIS — H9209 Otalgia, unspecified ear: Secondary | ICD-10-CM | POA: Insufficient documentation

## 2017-01-12 HISTORY — DX: Other abnormalities of gait and mobility: R26.89

## 2017-01-12 NOTE — Assessment & Plan Note (Signed)
Her weight had fluctuate up and down in the last few months. Diet and exercise counseling done. We will continue to monitor.

## 2017-01-12 NOTE — Progress Notes (Signed)
Subjective:     Patient ID: Andrea [name], female   DOB: 04/28/1955, 61 y.o.   MRN: [id number]  HPI HTN/Hypothyroidism/HLD: here for follow-up. She claimed compliance with all of her medications. No concern today. Morbid Obesity:Works in a cafeteria in the school system, she eats there, she eats out most of the time. Occasional fruits and vegetables. Does not have time to exercise. 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 Vit D Def: Takes Vit D on and off. Here for follow-up. Fall: She fell couple of weeks ago while walking up the side walk. She stated that she lost her balance. She feels like she has been losing her balance recently. Many years ago she fell as well. Denies any LOC, no headache, she bruised her nose and lips but has since healed.  Left ear: She c/o discomfort in her left ear ongoing for days. She used OTC ear cleanser with no improvement. She denies ear discharge, no hearing loss.  Palpitation: Hx of palpitation, she got worked up for this in the past. She denies palpitation in the last 6 months, no chest pain. Endorsed occasional SOB and fatigue. Currently she denies any symptoms.  Current Outpatient Medications on File Prior to Visit  Medication Sig Dispense Refill  . amLODipine (NORVASC) 5 MG tablet Take 1 tablet (5 mg total) by mouth daily. 90 tablet 1  . amLODipine (NORVASC) 5 MG tablet TAKE ONE TABLET BY MOUTH ONCE DAILY 90 tablet 1  . aspirin (ASPIRIN CHILDRENS) 81 MG chewable tablet Chew 81 mg by mouth daily.      [name] azelastine (OPTIVAR) 0.05 % ophthalmic solution Place 1 drop into both eyes 2 (two) times daily. 6 mL 0  . Blood Pressure Monitoring (BLOOD PRESSURE MONITOR/L CUFF) MISC Check blood pressure 3 times per week. 1 each 0  . fluticasone (FLONASE) 50 MCG/ACT nasal spray Place 2 sprays into both nostrils daily. 16 g 2  . levothyroxine (SYNTHROID, LEVOTHROID) 137 MCG tablet TAKE ONE TABLET BY MOUTH WITH BREAKFAST 90  tablet 1  . loratadine (CLARITIN) 10 MG tablet TAKE ONE TABLET BY MOUTH ONCE DAILY 90 tablet 1  . meloxicam (MOBIC) 7.5 MG tablet Take 1 tablet (7.5 mg total) by mouth daily. 30 tablet 0  . sertraline (ZOLOFT) 50 MG tablet Take 3 tablets (150 mg total) by mouth daily. 90 tablet 3  . simvastatin (ZOCOR) 40 MG tablet Take 1 tablet (40 mg total) by mouth at bedtime. 90 tablet 1  . triamterene-hydrochlorothiazide (MAXZIDE-25) 37.5-25 MG tablet Take 1 tablet by mouth daily. 90 tablet 0  . Vitamin D, Ergocalciferol, (DRISDOL) 50000 units CAPS capsule Take 1 capsule (50,000 Units total) by mouth every 7 (seven) days. 8 capsule 0   No current facility-administered medications on file prior to visit.    Past Medical History:  Diagnosis Date  . Arm pain, lateral, left 04/28/2016  . Carpal tunnel syndrome 04/26/2006   Qualifier: Diagnosis of  By: Dorathy Daft MD, Marjory Lies    . GANGLION CYST, WRIST, RIGHT 10/06/2008   Qualifier: Diagnosis of  By: Drue Flirt  MD, Merrily Brittle    . Right wrist pain 04/28/2016   Vitals:   01/12/17 1138  BP: 132/80  Pulse: 79  Temp: 98.7 F (37.1 C)  TempSrc: Oral  SpO2: 96%  Weight: 261 lb (118.4 kg)  Height: 5\' 3"  (1.6 m)     Review of Systems  Constitutional: Negative for unexpected weight change.  HENT: Positive  for ear pain. Negative for ear discharge and hearing loss.   Respiratory: Positive for shortness of breath. Negative for apnea, cough, chest tightness and wheezing.   Cardiovascular: Positive for palpitations. Negative for chest pain and leg swelling.  Gastrointestinal: Negative.   Genitourinary: Negative.   Musculoskeletal: Negative.   Neurological: Negative.   All other systems reviewed and are negative.      Objective:   Physical Exam  Constitutional: She is oriented to person, place, and time. She appears well-developed. No distress.  HENT:  Head: Normocephalic.  Right Ear: External ear and ear canal normal. No drainage.  Left Ear: Tympanic membrane,  external ear and ear canal normal. No drainage, swelling or tenderness. Tympanic membrane is not injected. No hemotympanum.  Ears:  Nose: Right sinus exhibits no maxillary sinus tenderness and no frontal sinus tenderness. Left sinus exhibits no maxillary sinus tenderness and no frontal sinus tenderness.  Eyes: Pupils are equal, round, and reactive to light.  Neck: Neck supple. No thyromegaly present.  Cardiovascular: Normal rate, regular rhythm, S1 normal, S2 normal, normal heart sounds and intact distal pulses.  No murmur heard. Occasional skipped beat but regular rhythm.   Pulmonary/Chest: Effort normal and breath sounds normal. No respiratory distress. She has no wheezes.  Abdominal: Soft. Bowel sounds are normal. She exhibits no distension and no mass. There is no tenderness.  Musculoskeletal: Normal range of motion. She exhibits no edema.  Neurological: She is alert and oriented to person, place, and time. Coordination normal.  Normal gait  Skin: Skin is warm.  Nursing note and vitals reviewed.  EKG:    Assessment:     HTN Hyperlipidemia Hypothyroidism Morbid obesity Vit D deficiency Left ear discomfort Palpitation    Plan:     Check problem list

## 2017-01-12 NOTE — Assessment & Plan Note (Signed)
Compliant with meds. TSH checked again. I will call her with result.

## 2017-01-12 NOTE — Assessment & Plan Note (Signed)
I checked her Vit D level today. Will contact with result.

## 2017-01-12 NOTE — Patient Instructions (Addendum)

## 2017-01-12 NOTE — Assessment & Plan Note (Signed)
Etiology unclear. ?? Deconditioning. Normal gait during her exam today. PT referral placed.

## 2017-01-12 NOTE — Assessment & Plan Note (Signed)
BP looks good on current regimen. She is uncertain if she takes Norvasc. Continue Maxzide Plan to return with med bottle during her next visit to further clarify her meds.

## 2017-01-12 NOTE — Assessment & Plan Note (Addendum)
Currently asymptomatic. Skipped beat on exam today. EKG reviewed without ischemic changes or arrhythmias, but shows PAC. We will monitor for now. Return precaution discussed.

## 2017-01-12 NOTE — Assessment & Plan Note (Signed)
Etiology unclear. Normal left ear exam. Has cerumen impaction on the right which does not bother her. Monitor for now. Return soon if worsening.

## 2017-01-12 NOTE — Assessment & Plan Note (Signed)
Compliant with meds. FLP checked today. Will contact soon with result.

## 2017-01-13 LAB — LIPID PANEL
Chol/HDL Ratio: 5 ratio — ABNORMAL HIGH (ref 0.0–4.4)
Cholesterol, Total: 174 mg/dL (ref 100–199)
HDL: 35 mg/dL — ABNORMAL LOW (ref >39)
LDL CALC: 115 mg/dL — AB (ref 0–99)
TRIGLYCERIDES: 121 mg/dL (ref 0–149)
VLDL CHOLESTEROL CAL: 24 mg/dL (ref 5–40)

## 2017-01-13 LAB — TSH: TSH: 1.78 u[IU]/mL (ref 0.450–4.500)

## 2017-01-13 LAB — BASIC METABOLIC PANEL
BUN / CREAT RATIO: 23 (ref 12–28)
BUN: 20 mg/dL (ref 8–27)
CO2: 27 mmol/L (ref 20–29)
CREATININE: 0.86 mg/dL (ref 0.57–1.00)
Calcium: 8.9 mg/dL (ref 8.7–10.3)
Chloride: 101 mmol/L (ref 96–106)
GFR calc Af Amer: 85 mL/min/{1.73_m2} (ref >59)
GFR, EST NON AFRICAN AMERICAN: 74 mL/min/{1.73_m2} (ref >59)
Glucose: 82 mg/dL (ref 65–99)
Potassium: 3.5 mmol/L (ref 3.5–5.2)
SODIUM: 141 mmol/L (ref 134–144)

## 2017-01-13 LAB — VITAMIN D 25 HYDROXY (VIT D DEFICIENCY, FRACTURES): VIT D 25 HYDROXY: 10.8 ng/mL — AB (ref 30.0–100.0)

## 2017-01-15 ENCOUNTER — Telehealth: Payer: Self-pay | Admitting: Family Medicine

## 2017-01-15 DIAGNOSIS — E559 Vitamin D deficiency, unspecified: Secondary | ICD-10-CM

## 2017-01-15 MED ORDER — VITAMIN D (ERGOCALCIFEROL) 1.25 MG (50000 UNIT) PO CAPS: 50000.0000 [IU] | ORAL_CAPSULE | ORAL | 0 refills | Status: DC

## 2017-01-15 NOTE — Telephone Encounter (Signed)
I discussed result with her. I recommended starting Vit D 50,000 units q-weekly for 8 weeks and then return for lab work. She agreed with plan and verbalized understanding.

## 2017-02-08 ENCOUNTER — Other Ambulatory Visit: Payer: Self-pay | Admitting: Family Medicine

## 2017-02-08 MED ORDER — LEVOTHYROXINE SODIUM 137 MCG PO TABS: 137.0000 ug | ORAL_TABLET | Freq: Every day | ORAL | 1 refills | Status: DC

## 2017-02-08 MED ORDER — LORATADINE 10 MG PO TABS: 10.0000 mg | ORAL_TABLET | Freq: Every day | ORAL | 1 refills | Status: DC

## 2017-02-21 ENCOUNTER — Ambulatory Visit: Payer: BC Managed Care – PPO | Attending: Family Medicine | Admitting: Physical Therapy

## 2017-02-21 ENCOUNTER — Encounter: Payer: Self-pay | Admitting: Physical Therapy

## 2017-02-21 DIAGNOSIS — R2689 Other abnormalities of gait and mobility: Secondary | ICD-10-CM | POA: Insufficient documentation

## 2017-02-21 DIAGNOSIS — R2681 Unsteadiness on feet: Secondary | ICD-10-CM | POA: Diagnosis present

## 2017-02-21 DIAGNOSIS — M6281 Muscle weakness (generalized): Secondary | ICD-10-CM | POA: Diagnosis present

## 2017-02-22 NOTE — Therapy (Signed)
[facility] [address] [place], [place], [address] Phone: [phone]   Fax:  [phone]  Physical Therapy Evaluation  Patient Details  Name: Andrea [name] MRN: [id number] Date of Birth: April 21, 1955 Referring Provider: [name], MD   Encounter Date: 02/21/2017  PT End of Session - 02/21/17 1823    Visit Number  1    Number of Visits  1    Authorization Type  [employer]    PT Start Time  1230    PT Stop Time  1310    PT Time Calculation (min)  40 min    Activity Tolerance  Patient tolerated treatment well    Behavior During Therapy  [facility] for tasks assessed/performed       Past Medical History:  Diagnosis Date  . Arm pain, lateral, left 04/28/2016  . Carpal tunnel syndrome 04/26/2006   Qualifier: Diagnosis of  By: Dorathy Daft MD, Marjory Lies    . GANGLION CYST, WRIST, RIGHT 10/06/2008   Qualifier: Diagnosis of  By: Drue Flirt  MD, Merrily Brittle    . Right wrist pain 04/28/2016    Past Surgical History:  Procedure Laterality Date  . ABDOMINAL HYSTERECTOMY     fibroids    There were no vitals filed for this visit.   Subjective Assessment - 02/21/17 1233    Subjective  This 61yo female was referred to Physical Therapy for Vitamin D Deficiency & Balance issues by [name], MD on 01/12/2017. She fell 2 years ago but recently noticed increased falls. She feels dizzy.     Pertinent History  Vit D deficiency, morbid obesity, benign HTN, Hypothyroidism,     Patient Stated Goals  To stop falling and improve balance    Currently in Pain?  No/denies         [facility] PT Assessment - 02/21/17 1230      Assessment   Medical Diagnosis  Balance Issues    Referring Provider  [name], MD    Onset Date/Surgical Date  01/12/17    Hand Dominance  Right      Precautions   Precautions  Fall      Balance Screen   Has the patient fallen in the past 6 months  Yes    How many times?  1 several near falls    Has the  patient had a decrease in activity level because of a fear of falling?   No    Is the patient reluctant to leave their home because of a fear of falling?   No      Home Environment   Living Environment  Private residence    Living Arrangements  Alone    Type of [place] Access  Level entry curb at [address]  One level      Prior Function   Level of Independence  Independent;Independent with gait    Vocation  Full time employment    [place]: In mornings cafeteria washing dishes, food prep, serving. In evenings custodian.  Lifting garbage.       Posture/Postural Control   Posture/Postural Control  Postural limitations    Postural Limitations  Rounded Shoulders;Forward head;Decreased lumbar lordosis      ROM / Strength   AROM / PROM / Strength  AROM;Strength      AROM   Overall AROM   Within functional limits for tasks performed    Overall AROM  Comments  Decreased flexibility in lumbar, hip flexors, hamstrings & gastroc      Strength   Overall Strength  Deficits    Overall Strength Comments  BUEs grossly WFL    Strength Assessment Site  Hip;Knee;Ankle    Right/Left Hip  Right;Left    Right Hip Flexion  4+/5    Right Hip Extension  4-/5    Right Hip ABduction  4/5    Left Hip Flexion  5/5    Left Hip Extension  4/5    Left Hip ABduction  4+/5    Right/Left Knee  Right;Left    Right Knee Flexion  4/5    Right Knee Extension  5/5    Left Knee Flexion  5/5    Left Knee Extension  5/5    Right/Left Ankle  Right;Left    Right Ankle Dorsiflexion  4/5    Left Ankle Dorsiflexion  5/5      Ambulation/Gait   Ambulation/Gait  Yes    Ambulation/Gait Assistance  6: Modified independent (Device/Increase time)    Ambulation Distance (Feet)  300 Feet    Assistive device  None    Gait Pattern  Step-through pattern;Decreased stance time - right;Trunk flexed;Wide base of support    Ambulation Surface  Indoor;Level     Gait velocity  2.66 ft/sec    Stairs  Yes    Stairs Assistance  6: Modified independent (Device/Increase time)    Stair Management Technique  Two rails;Alternating pattern;Forwards    Number of Stairs  4      6 minute walk test results    Aerobic Endurance Distance Walked  300    Endurance additional comments  Rest HR 81 SpO2 97%, after gait HR 102, SpO2 99%      Standardized Balance Assessment   Standardized Balance Assessment  Berg Balance Test;Timed Up and Go Test      Berg Balance Test   Sit to Stand  Able to stand without using hands and stabilize independently    Standing Unsupported  Able to stand safely 2 minutes    Sitting with Back Unsupported but Feet Supported on Floor or Stool  Able to sit safely and securely 2 minutes    Stand to Sit  Sits safely with minimal use of hands    Transfers  Able to transfer safely, minor use of hands    Standing Unsupported with Eyes Closed  Able to stand 10 seconds safely    Standing Ubsupported with Feet Together  Able to place feet together independently and stand for 1 minute with supervision    From Standing, Reach Forward with Outstretched Arm  Can reach confidently >25 cm (10")    From Standing Position, Pick up Object from Floor  Able to pick up shoe safely and easily    From Standing Position, Turn to Look Behind Over each Shoulder  Looks behind from both sides and weight shifts well    Turn 360 Degrees  Able to turn 360 degrees safely but slowly    Standing Unsupported, Alternately Place Feet on Step/Stool  Able to stand independently and complete 8 steps >20 seconds    Standing Unsupported, One Foot in Front  Able to plae foot ahead of the other independently and hold 30 seconds    Standing on One Leg  Tries to lift leg/unable to hold 3 seconds but remains standing independently    Total Score  48      Timed Up and Go Test  Normal TUG (seconds)  14.29 no device         Vestibular Assessment - 02/21/17 1230      Symptom  Behavior   Type of Dizziness  Spinning    Frequency of Dizziness  2x/month    Duration of Dizziness  <1 minute    Aggravating Factors  Mornings;Turning body quickly;Turning head quickly;Supine to sit;Sit to stand    Relieving Factors  Rest stand still      Occulomotor Exam   Occulomotor Alignment  Normal    Spontaneous  Absent    Gaze-induced  Absent    Head shaking Horizontal  Absent    Head Shaking Vertical  Absent    Smooth Pursuits  Intact    Saccades  Intact      Positional Testing   Sidelying Test  Sidelying Right;Sidelying Left      Sidelying Right   Sidelying Right Duration  0    Sidelying Right Symptoms  No nystagmus      Sidelying Left   Sidelying Left Duration  0    Sidelying Left Symptoms  No nystagmus      Positional Sensitivities   Sit to Supine  No dizziness    Supine to Left Side  No dizziness    Supine to Right Side  No dizziness    Supine to Sitting  No dizziness    Nose to Right Knee  No dizziness    Nose to Left Knee  No dizziness    Head Turning x 5  No dizziness    Head Nodding x 5  No dizziness    Pivot Right in Standing  No dizziness    Pivot Left in Standing  No dizziness    Rolling Right  No dizziness    Rolling Left  No dizziness         Objective measurements completed on examination: See above findings.                           Plan - 02/21/17 1825    Clinical Impression Statement  This 61yo female presents with increased incidents of balance issues. She reports some dizziness with movements at times. Vestibular testing today did not elicit any dizziness or symptoms. Manual Muscle Testing found some mild weakness in right leg. She also had some impaired flexibility of trunk & lower extremities. Berg Balance of 48/56 indicates 50% moderate fall risk. Timed Up-Go 14.29sec (>13.5sec) indicates fall risk. Patient's heart rate increased from 81bpm at rest to 102bpm with standing & gait activities (mild exertion)  indicates deconditioning. PT discussed need for exercise routine to address deficits. Patient reports no time to exercise due to working 2 jobs. She declined further Physical Therapy as she does not feel she would be able to exercise outside of PT.     Clinical Presentation  Stable    Clinical Decision Making  Low    Rehab Potential  Good    PT Frequency  One time visit    PT Next Visit Plan  Patient declined further PT so evaluation only    Consulted and Agree with Plan of Care  Patient       Patient will benefit from skilled therapeutic intervention in order to improve the following deficits and impairments:  Abnormal gait, Decreased activity tolerance, Decreased balance, Decreased endurance, Impaired flexibility, Decreased strength, Postural dysfunction, Obesity  Visit Diagnosis: Other abnormalities of gait and mobility  Muscle weakness (generalized)  Unsteadiness on feet     Problem List Patient Active Problem List   Diagnosis Date Noted  . Vitamin D deficiency 01/12/2017  . Imbalance 01/12/2017  . Ear discomfort, left 01/12/2017  . Acute right-sided thoracic back pain 09/15/2016  . Noncompliance with medications 01/07/2016  . Snoring 09/07/2014  . Abnormal mammogram 08/28/2014  . Allergic rhinitis 06/22/2014  . Hypokalemia 05/27/2014  . Palpitations 09/22/2013  . First degree AV block 09/22/2013  . Cataract 09/19/2013  . Myalgia 02/03/2011  . Hypothyroidism 04/26/2006  . Hyperlipidemia 04/26/2006  . Severe obesity (BMI >= 40) ([place]) 04/26/2006  . Major depressive disorder, recurrent episode ([place]) 04/26/2006  . HYPERTENSION, BENIGN SYSTEMIC 04/26/2006    Jamey Reas PT, DPT 02/22/2017, 12:36 PM  [facility] [address] [place], [place], [address] Phone: [phone]   Fax:  [phone]  Name: Andrea [name] MRN: [id number] Date of Birth: 1955-10-31

## 2017-04-10 ENCOUNTER — Encounter: Payer: Self-pay | Admitting: Family Medicine

## 2017-04-10 ENCOUNTER — Other Ambulatory Visit: Payer: Self-pay | Admitting: Family Medicine

## 2017-04-10 ENCOUNTER — Ambulatory Visit: Payer: BC Managed Care – PPO | Admitting: Family Medicine

## 2017-04-10 ENCOUNTER — Other Ambulatory Visit: Payer: Self-pay

## 2017-04-10 VITALS — BP 128/82 | HR 69 | Temp 98.0°F | Ht 63.0 in | Wt 260.0 lb

## 2017-04-10 DIAGNOSIS — E559 Vitamin D deficiency, unspecified: Secondary | ICD-10-CM

## 2017-04-10 DIAGNOSIS — I10 Essential (primary) hypertension: Secondary | ICD-10-CM

## 2017-04-10 DIAGNOSIS — E569 Vitamin deficiency, unspecified: Secondary | ICD-10-CM

## 2017-04-10 NOTE — Patient Instructions (Signed)
Vitamin D Deficiency Vitamin D deficiency is when your body does not have enough vitamin D. Vitamin D is important because:  It helps your body use other minerals that your body needs.  It helps keep your bones strong and healthy.  It may help to prevent some diseases.  It helps your heart and other muscles work well.  You can get vitamin D by:  Eating foods with vitamin D in them.  Drinking or eating milk or other foods that have had vitamin D added to them.  Taking a vitamin D supplement.  Being in the sun.  Not getting enough vitamin D can make your bones become soft. It can also cause other health problems. Follow these instructions at home:  Take medicines and supplements only as told by your doctor.  Eat foods that have vitamin D. These include: ? Dairy products, cereals, or juices with added vitamin D. Check the label for vitamin D. ? Fatty fish like salmon or trout. ? Eggs. ? Oysters.  Do not use tanning beds.  Stay at a healthy weight. Lose weight, if needed.  Keep all follow-up visits as told by your doctor. This is important. Contact a doctor if:  Your symptoms do not go away.  You feel sick to your stomach (nauseous).  Youthrow up (vomit).  You poop less often than usual or you have trouble pooping (constipation). This information is not intended to replace advice given to you by your health care provider. Make sure you discuss any questions you have with your health care provider. Document Released: 02/02/2011 Document Revised: 07/22/2015 Document Reviewed: 07/01/2014 Elsevier Interactive Patient Education  2018 [employer]  

## 2017-04-10 NOTE — Assessment & Plan Note (Addendum)
BP looks good. Continue Maxide-25 1 tab daily and Norvasc 5 mg qd. Home BP monitoring recommended.

## 2017-04-10 NOTE — Progress Notes (Signed)
Subjective:     Patient ID: Andrea [name], female   DOB: 1955-12-07, 62 y.o.   MRN: [id number]  HPI Vitamin D: Here for lab follow-up. She was on Vit D 50.000 units supplement, last dose was 1 week ago. Denies any bone pain. HTN: She is compliant with her meds, here for follow-up.  Current Outpatient Medications on File Prior to Visit  Medication Sig Dispense Refill  . amLODipine (NORVASC) 5 MG tablet Take 1 tablet (5 mg total) by mouth daily. 90 tablet 1  . loratadine (CLARITIN) 10 MG tablet Take 1 tablet (10 mg total) by mouth daily. 90 tablet 1  . sertraline (ZOLOFT) 50 MG tablet Take 3 tablets (150 mg total) by mouth daily. 90 tablet 3  . simvastatin (ZOCOR) 40 MG tablet Take 1 tablet (40 mg total) by mouth at bedtime. 90 tablet 1  . triamterene-hydrochlorothiazide (MAXZIDE-25) 37.5-25 MG tablet Take 1 tablet by mouth daily. 90 tablet 0  . aspirin (ASPIRIN CHILDRENS) 81 MG chewable tablet Chew 81 mg by mouth daily.      [name] azelastine (OPTIVAR) 0.05 % ophthalmic solution Place 1 drop into both eyes 2 (two) times daily. (Patient not taking: Reported on 02/21/2017) 6 mL 0  . Blood Pressure Monitoring (BLOOD PRESSURE MONITOR/L CUFF) MISC Check blood pressure 3 times per week. (Patient not taking: Reported on 02/21/2017) 1 each 0  . fluticasone (FLONASE) 50 MCG/ACT nasal spray Place 2 sprays into both nostrils daily. (Patient not taking: Reported on 02/21/2017) 16 g 2  . levothyroxine (SYNTHROID, LEVOTHROID) 137 MCG tablet Take 1 tablet (137 mcg total) by mouth daily before breakfast. 90 tablet 1  . meloxicam (MOBIC) 7.5 MG tablet Take 1 tablet (7.5 mg total) by mouth daily. (Patient not taking: Reported on 02/21/2017) 30 tablet 0  . Vitamin D, Ergocalciferol, (DRISDOL) 50000 units CAPS capsule Take 1 capsule (50,000 Units total) every 7 (seven) days by mouth. 8 capsule 0   No current facility-administered medications on file prior to visit.    Past Medical History:  Diagnosis Date  .  Arm pain, lateral, left 04/28/2016  . Carpal tunnel syndrome 04/26/2006   Qualifier: Diagnosis of  By: Dorathy Daft MD, Marjory Lies    . GANGLION CYST, WRIST, RIGHT 10/06/2008   Qualifier: Diagnosis of  By: Drue Flirt  MD, Merrily Brittle    . Right wrist pain 04/28/2016     Review of Systems  Respiratory: Negative.   Cardiovascular: Negative.   Gastrointestinal: Negative.   Musculoskeletal: Negative.   All other systems reviewed and are negative.      Objective:   Physical Exam  Constitutional: She is oriented to person, place, and time. She appears well-developed. No distress.  Cardiovascular: Normal rate, regular rhythm and normal heart sounds.  No murmur heard. Pulmonary/Chest: Effort normal and breath sounds normal. No respiratory distress. She has no wheezes.  Musculoskeletal: Normal range of motion. She exhibits no edema.  Neurological: She is alert and oriented to person, place, and time.  Nursing note and vitals reviewed.      Assessment:     Vitamin D Deficiency HTN    Plan:     Check problem list

## 2017-04-10 NOTE — Assessment & Plan Note (Signed)
[facility] [place] lab today. I will contact her with result soon,

## 2017-04-11 ENCOUNTER — Telehealth: Payer: Self-pay | Admitting: Family Medicine

## 2017-04-11 DIAGNOSIS — E559 Vitamin D deficiency, unspecified: Secondary | ICD-10-CM

## 2017-04-11 LAB — VITAMIN D 25 HYDROXY (VIT D DEFICIENCY, FRACTURES): Vit D, 25-Hydroxy: 21.9 ng/mL — ABNORMAL LOW (ref 30.0–100.0)

## 2017-04-11 MED ORDER — VITAMIN D (ERGOCALCIFEROL) 1.25 MG (50000 UNIT) PO CAPS: 50000.0000 [IU] | ORAL_CAPSULE | ORAL | 0 refills | Status: DC

## 2017-04-11 NOTE — Telephone Encounter (Addendum)
Vitamin D level increased from 10 to 21 after 8 weeks of Vit D 50,000 supplement q weekly.  I was unable to reach her. HIPPA compliant message left for her to call back.   Plan:  Continue Vitamin d 50, 000 units for 4 more weeks.  Start Vitamin 2000 or 3000 units qd after completing 4 weeks treatment. Return in 2-3 months to recheck Vitamin D level. F/U as needed. I refilled her meds.

## 2017-05-07 ENCOUNTER — Other Ambulatory Visit: Payer: Self-pay | Admitting: Family Medicine

## 2017-05-25 ENCOUNTER — Other Ambulatory Visit: Payer: BC Managed Care – PPO

## 2017-05-25 DIAGNOSIS — E559 Vitamin D deficiency, unspecified: Secondary | ICD-10-CM

## 2017-05-26 LAB — VITAMIN D 25 HYDROXY (VIT D DEFICIENCY, FRACTURES): VIT D 25 HYDROXY: 24 ng/mL — AB (ref 30.0–100.0)

## 2017-06-15 ENCOUNTER — Telehealth: Payer: Self-pay | Admitting: Family Medicine

## 2017-06-15 DIAGNOSIS — E559 Vitamin D deficiency, unspecified: Secondary | ICD-10-CM

## 2017-06-15 MED ORDER — VITAMIN D (ERGOCALCIFEROL) 1.25 MG (50000 UNIT) PO CAPS: 50000.0000 [IU] | ORAL_CAPSULE | ORAL | 0 refills | Status: DC

## 2017-06-15 NOTE — Telephone Encounter (Signed)
I am covering for Dr. [name] who is away from the office.  Called patient regarding low vit D level, which is improved from prior. Recommend another 4 weeks of 50k units weekly. Hold regular daily supplementation until after that is done. Follow up with Dr. [name] next month, as she'll be back from maternity leave then.  Patient understood & was appreciative.  Leeanne Rio, MD

## 2017-06-25 ENCOUNTER — Telehealth: Payer: Self-pay

## 2017-06-25 NOTE — Telephone Encounter (Signed)
Pt called nurse line, states she had severe cramping in her R hand earlier today, and was sent home from work because she couldn't do anything. States at work they gave her mustard for the cramping. No longer cramping but still wants to be seen for eval. Scheduled tomorrow with Dr. Ola Spurr. Wallace Cullens, RN

## 2017-06-26 ENCOUNTER — Ambulatory Visit: Payer: BC Managed Care – PPO | Admitting: Internal Medicine

## 2017-06-26 VITALS — BP 130/80 | HR 93 | Temp 98.8°F | Wt 261.0 lb

## 2017-06-26 DIAGNOSIS — E039 Hypothyroidism, unspecified: Secondary | ICD-10-CM

## 2017-06-26 DIAGNOSIS — R252 Cramp and spasm: Secondary | ICD-10-CM

## 2017-06-26 MED ORDER — NAPROXEN 500 MG PO TBEC: 500.0000 mg | DELAYED_RELEASE_TABLET | Freq: Two times a day (BID) | ORAL | 0 refills | Status: DC

## 2017-06-26 NOTE — Progress Notes (Signed)
[name] Progress Note  Subjective:  Andrea [name] is a 62 y.o. female with history of HTN, hypothyroidism on synthroid, HLD, obesity, and vitamin D deficiency who presents for right hand cramping. She had an episode of hand cramping with sensation of "pins and needles" that lasted about 10 minutes while at work yesterday afternoon. She has noted hand swelling since then. She has not taken anything for pain. She has not had a similar episode before but has had pain in her left CMC joint in the past from arthritis. 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 She is right handed. No family history of arthritis aside from OA as far as patient knows. No particular movements seem to bring on discomfort. ROS: No chest pain, no fevers, no rashes  No Known Allergies  Social History   Tobacco Use  . Smoking status: Never Smoker  . Smokeless tobacco: Never Used  Substance Use Topics  . Alcohol use: No    Objective: Blood pressure 130/80, pulse 93, temperature 98.8 F (37.1 C), temperature source Oral, weight 261 lb (118.4 kg), SpO2 97 %. Body mass index is 46.23 kg/m. Constitutional: Pleasant, obese female in NAD Cardiovascular: RRR, S1, S2, no m/r/g.  Pulmonary/Chest: Effort normal and breath sounds normal.  Musculoskeletal: Dorsum of right hand with slight swelling compared to left. No point tenderness. No LE edema.  Neurological: AOx3, no focal deficits. Sensation to soft and sharp intact and symmetric of UEs bilaterally. Grip strength 5/5 bilaterally. Negative Phalen's.  Skin: Skin is warm and dry. No rash noted.  Psychiatric: Normal mood and affect.  Vitals reviewed  Assessment/Plan: Hand cramp - Resolved but continues to have slight swelling. Suspect muscle strain vs tendinopathy with history of job with repetative movements.  - Recommended icing area and trying NSAIDs. Ordered naproxen to take 500 mg BID prn for up to 2  weeks.  - Ordered TSH given hand swelling. No increased warmth or area of firmness to suggest infection or thrombosis. - Ordered CRP as gross maker for inflammatory arthritis. Ordered CMP given that patient is on a statin and has history of hypokalemia.  - Patient reports she can take off from work without repercussion, so encouraged her to rest for the next few days while having swelling.   Follow-up if no improvement after about a week.  Olene Floss, MD [facility], PGY-3

## 2017-06-26 NOTE — Patient Instructions (Signed)
Andrea [name],  I am checking labs to see if there is a reversible cause of your muscle spasm.  In the meantime, try light stretching and the anti-inflammatory naproxen 500 mg twice daily as needed for pain--take this with food.  Best, Dr. Ola Spurr

## 2017-06-27 ENCOUNTER — Encounter: Payer: Self-pay | Admitting: Internal Medicine

## 2017-06-27 DIAGNOSIS — R252 Cramp and spasm: Secondary | ICD-10-CM | POA: Insufficient documentation

## 2017-06-27 LAB — C-REACTIVE PROTEIN: CRP: 5.6 mg/L — ABNORMAL HIGH (ref 0.0–4.9)

## 2017-06-27 LAB — COMPREHENSIVE METABOLIC PANEL
ALBUMIN: 3.7 g/dL (ref 3.6–4.8)
ALT: 28 IU/L (ref 0–32)
AST: 43 IU/L — ABNORMAL HIGH (ref 0–40)
Albumin/Globulin Ratio: 1.2 (ref 1.2–2.2)
Alkaline Phosphatase: 90 IU/L (ref 39–117)
BILIRUBIN TOTAL: 0.2 mg/dL (ref 0.0–1.2)
BUN/Creatinine Ratio: 23 (ref 12–28)
BUN: 21 mg/dL (ref 8–27)
CALCIUM: 9.4 mg/dL (ref 8.7–10.3)
CHLORIDE: 101 mmol/L (ref 96–106)
CO2: 26 mmol/L (ref 20–29)
CREATININE: 0.9 mg/dL (ref 0.57–1.00)
GFR, EST AFRICAN AMERICAN: 80 mL/min/{1.73_m2} (ref >59)
GFR, EST NON AFRICAN AMERICAN: 69 mL/min/{1.73_m2} (ref >59)
GLUCOSE: 95 mg/dL (ref 65–99)
Globulin, Total: 3.1 g/dL (ref 1.5–4.5)
Potassium: 3.6 mmol/L (ref 3.5–5.2)
Sodium: 142 mmol/L (ref 134–144)
TOTAL PROTEIN: 6.8 g/dL (ref 6.0–8.5)

## 2017-06-27 LAB — TSH: TSH: 2.03 u[IU]/mL (ref 0.450–4.500)

## 2017-06-27 NOTE — Assessment & Plan Note (Addendum)
-   Resolved but continues to have slight swelling. Suspect muscle strain vs tendinopathy with history of job with repetative movements.  - Recommended icing area and trying NSAIDs. Ordered naproxen to take 500 mg BID prn for up to 2 weeks.  - Ordered TSH given hand swelling. No increased warmth or area of firmness to suggest infection or thrombosis. - Ordered CRP as gross maker for inflammatory arthritis. Ordered CMP given that patient is on a statin and has history of hypokalemia.  - Patient reports she can take off from work without repercussion, so encouraged her to rest for the next few days while having swelling.

## 2017-08-13 ENCOUNTER — Encounter: Payer: Self-pay | Admitting: Family Medicine

## 2017-08-14 ENCOUNTER — Other Ambulatory Visit: Payer: Self-pay

## 2017-08-14 ENCOUNTER — Ambulatory Visit: Payer: BC Managed Care – PPO | Admitting: Family Medicine

## 2017-08-14 ENCOUNTER — Encounter: Payer: Self-pay | Admitting: Family Medicine

## 2017-08-14 VITALS — BP 124/90 | HR 77 | Temp 98.3°F | Ht 63.0 in | Wt 251.0 lb

## 2017-08-14 DIAGNOSIS — R1310 Dysphagia, unspecified: Secondary | ICD-10-CM

## 2017-08-14 DIAGNOSIS — F331 Major depressive disorder, recurrent, moderate: Secondary | ICD-10-CM

## 2017-08-14 DIAGNOSIS — E559 Vitamin D deficiency, unspecified: Secondary | ICD-10-CM | POA: Diagnosis not present

## 2017-08-14 DIAGNOSIS — K219 Gastro-esophageal reflux disease without esophagitis: Secondary | ICD-10-CM | POA: Insufficient documentation

## 2017-08-14 DIAGNOSIS — I10 Essential (primary) hypertension: Secondary | ICD-10-CM | POA: Diagnosis not present

## 2017-08-14 MED ORDER — RANITIDINE HCL 150 MG PO CAPS: 150.0000 mg | ORAL_CAPSULE | Freq: Two times a day (BID) | ORAL | 1 refills | Status: DC

## 2017-08-14 NOTE — Assessment & Plan Note (Signed)
Etiology unclear. Start Ranitidine for possible GERD. Obtain swallow eval.

## 2017-08-14 NOTE — Assessment & Plan Note (Signed)
Off Supplement. Lab rechecked today.

## 2017-08-14 NOTE — Patient Instructions (Addendum)
It was nice seeing you today. Please start taking Zoloft 150 mg daily. I will like for you to be seen by a psychiatrist. I will place referral, however, also contact the office below to schedule an appointment, this could be faster than the referral I will place.  I will like to see you back in 4 week.   Dysphagia Dysphagia is trouble swallowing. This condition occurs when solids and liquids stick in a person's throat on the way down to the stomach, or when food takes longer to get to the stomach. You may have problems swallowing food, liquids, or both. You may also have pain while trying to swallow. It may take you more time and effort to swallow something. What are the causes? This condition is caused by:  Problems with the muscles. They may make it difficult for you to move food and liquids through the tube that connects your mouth to your stomach (esophagus). You may have ulcers, scar tissue, or inflammation that blocks the normal passage of food and liquids. Causes of these problems include: ? Acid reflux from your stomach into your esophagus (gastroesophageal reflux). ? Infections. ? Radiation treatment for cancer. ? Medicines taken without enough fluids to wash them down into your stomach.  Nerve problems. These prevent signals from being sent to the muscles of your esophagus to squeeze (contract) and move what you swallow down to your stomach.  Globus pharyngeus. This is a common problem that involves feeling like something is stuck in the throat or a sense of trouble with swallowing even though nothing is wrong with the swallowing passages.  Stroke. This can affect the nerves and make it difficult to swallow.  Certain conditions, such as cerebral palsy or Parkinson disease.  What are the signs or symptoms? Common symptoms of this condition include:  A feeling that solids or liquids are stuck in your throat on the way down to the stomach.  Food taking too long to get to the  stomach.  Other symptoms include:  Food moving back from your stomach to your mouth (regurgitation).  Noises coming from your throat.  Chest discomfort with swallowing.  A feeling of fullness when swallowing.  Drooling, especially when the throat is blocked.  Pain while swallowing.  Heartburn.  Coughing or gagging while trying to swallow.  How is this diagnosed? This condition is diagnosed by:  Barium X-ray. In this test, you swallow a white substance (contrast medium)that sticks to the inside of your esophagus. X-ray images are then taken.  Endoscopy. In this test, a flexible telescope is inserted down your throat to look at your esophagus and your stomach.  CT scans and MRI.  How is this treated? Treatment for dysphagia depends on the cause of the condition:  If the dysphagia is caused by acid reflux or infection, medicines may be used. They may include antibiotics and heartburn medicines.  If the dysphagia is caused by problems with your muscles, swallowing therapy may be used to help you strengthen your swallowing muscles. You may have to do specific exercises to strengthen the muscles or stretch them.  If the dysphagia is caused by a blockage or mass, procedures to remove the blockage may be done. You may need surgery and a feeding tube.  You may need to make diet changes. Ask your health care provider for specific instructions. Follow these instructions at home: Eating and drinking  Try to eat soft food that is easier to swallow.  Follow any diet changes as told  by your health care provider.  Cut your food into small pieces and eat slowly.  Eat and drink only when you are sitting upright.  Do not drink alcohol or caffeine. If you need help quitting, ask your health care provider. General instructions  Check your weight every day to make sure you are not losing weight.  Take over-the-counter and prescription medicines only as told by your health care  provider.  If you were prescribed an antibiotic medicine, take it as told by your health care provider. Do not stop taking the antibiotic even if you start to feel better.  Do not use any products that contain nicotine or tobacco, such as cigarettes and e-cigarettes. If you need help quitting, ask your health care provider.  Keep all follow-up visits as told by your health care provider. This is important. Contact a health care provider if:  You lose weight because you cannot swallow.  You cough when you drink liquids (aspiration).  You cough up partially digested food. Get help right away if:  You cannot swallow your saliva.  You have shortness of breath or a fever, or both.  You have a hoarse voice and also have trouble swallowing. Summary  Dysphagia is trouble swallowing. This condition occurs when solids and liquids stick in a person's throat on the way down to the stomach, or when food takes longer to get to the stomach.  Dysphagia has many possible causes and symptoms.  Treatment for dysphagia depends on the cause of the condition. This information is not intended to replace advice given to you by your health care provider. Make sure you discuss any questions you have with your health care provider. Document Released: 02/11/2000 Document Revised: 02/03/2016 Document Reviewed: 02/03/2016 Elsevier Interactive Patient Education  2017 [employer].

## 2017-08-14 NOTE — Assessment & Plan Note (Signed)
Off Norvasc. Diastolic BP slightly elevated. We will monitor on Maxzide. Monitor closely for now.  Consider restarting Norvasc. Unclear why it was stopped.

## 2017-08-14 NOTE — Progress Notes (Signed)
Subjective:     Patient ID: Andrea [name], female   DOB: 1955/07/27, 62 y.o.   MRN: [id number]  HPI Depression: Patient was accompanied by her older sister today. She stated about a month ago or so, she was no where to be found and later the family heard she had gone to [place]. Patient stated she just wanted to go away, her mind was off and felt like her depression was worsening. At the time she claimed compliance with her meds, although he sister doubts it. She was admitted to the hospital in [place] and her Zoloft was switched to 100 mg from 150 mg. Per patient's sister, the [place] doctor thought she was on Zoloft 50 mg daily and wanted her to go up to 100 mg qd. However, she was actually on 150 mg qd. She returned to [facility] 1 week ago (June 8th) and had been on Zoloft 100 mg for more than a month now. Obesity:Working on diet change and exercise, however a bit difficult. Vitamin D Def: Here for follow-up. Difficulty swallowing: C/O difficulty with swallowing for 6 month, no throat pain, she coughs up mucus one in a while, she eats and gag on her food most of the time. She denies stomach pain, no N/V, no change in bowel habit. HTN: F/U   Current Outpatient Medications on File Prior to Visit  Medication Sig Dispense Refill  . levothyroxine (SYNTHROID, LEVOTHROID) 137 MCG tablet Take 1 tablet (137 mcg total) by mouth daily before breakfast. 90 tablet 1  . sertraline (ZOLOFT) 50 MG tablet TAKE 3 TABLETS BY MOUTH ONCE DAILY 90 tablet 1  . simvastatin (ZOCOR) 40 MG tablet Take 1 tablet (40 mg total) by mouth at bedtime. 90 tablet 1  . triamterene-hydrochlorothiazide (MAXZIDE-25) 37.5-25 MG tablet Take 1 tablet by mouth daily. 90 tablet 0  . amLODipine (NORVASC) 5 MG tablet Take 1 tablet (5 mg total) by mouth daily. (Patient not taking: Reported on 08/14/2017) 90 tablet 1  . aspirin (ASPIRIN CHILDRENS) 81 MG chewable tablet Chew 81 mg by mouth daily.      . Blood Pressure Monitoring (BLOOD  PRESSURE MONITOR/L CUFF) MISC Check blood pressure 3 times per week. (Patient not taking: Reported on 02/21/2017) 1 each 0  . loratadine (CLARITIN) 10 MG tablet Take 1 tablet (10 mg total) by mouth daily. (Patient not taking: Reported on 08/14/2017) 90 tablet 1  . naproxen (EC NAPROSYN) 500 MG EC tablet Take 1 tablet (500 mg total) by mouth 2 (two) times daily with a meal. (Patient not taking: Reported on 08/14/2017) 30 tablet 0  . Vitamin D, Ergocalciferol, (DRISDOL) 50000 units CAPS capsule Take 1 capsule (50,000 Units total) by mouth every 7 (seven) days. (Patient not taking: Reported on 08/14/2017) 4 capsule 0   No current facility-administered medications on file prior to visit.    Past Medical History:  Diagnosis Date  . Allergic rhinitis 06/22/2014  . Arm pain, lateral, left 04/28/2016  . Carpal tunnel syndrome 04/26/2006   Qualifier: Diagnosis of  By: Dorathy Daft MD, Marjory Lies    . GANGLION CYST, WRIST, RIGHT 10/06/2008   Qualifier: Diagnosis of  By: Drue Flirt  MD, Merrily Brittle    . Right wrist pain 04/28/2016   Vitals:   08/14/17 0843  BP: 124/90  Pulse: 77  Temp: 98.3 F (36.8 C)  TempSrc: Oral  SpO2: 94%  Weight: 251 lb (113.9 kg)  Height: 5\' 3"  (1.6 m)     Review of Systems  HENT: Positive for trouble swallowing.  Respiratory: Negative.   Cardiovascular: Negative.   Gastrointestinal: Negative for abdominal pain, blood in stool, constipation and diarrhea.  Musculoskeletal: Negative.   Neurological: Negative.   Psychiatric/Behavioral:       Depression  All other systems reviewed and are negative.  Depression screen PHQ 2/9 08/14/2017  Decreased Interest 3  Down, Depressed, Hopeless 3  PHQ - 2 Score 6  Altered sleeping 3  Tired, decreased energy 3  Change in appetite 3  Feeling bad or failure about yourself  3  Trouble concentrating 2  Moving slowly or fidgety/restless 3  Suicidal thoughts 0  PHQ-9 Score 23  Difficult doing work/chores Extremely dIfficult      Objective:    Physical Exam  Constitutional: She is oriented to person, place, and time. She appears well-developed. No distress.  HENT:  Head: Normocephalic.  Right Ear: Tympanic membrane and external ear normal.  Left Ear: Tympanic membrane and external ear normal.  Mouth/Throat: Oropharynx is clear and moist. No oropharyngeal exudate.  Neck: Neck supple. No thyromegaly present.  Cardiovascular: Normal rate, regular rhythm, normal heart sounds and intact distal pulses. Exam reveals no friction rub.  No murmur heard. Pulmonary/Chest: Effort normal and breath sounds normal. No stridor. No respiratory distress. She has no wheezes.  Abdominal: Soft. Bowel sounds are normal. She exhibits no distension. There is no tenderness. No hernia.  Musculoskeletal: Normal range of motion.  Neurological: She is alert and oriented to person, place, and time. She displays normal reflexes. No sensory deficit. Coordination normal.  Skin: Skin is warm.  Psychiatric: Judgment normal. Her mood appears not anxious. Her affect is not angry, not blunt, not labile and not inappropriate. She is slowed. She is not actively hallucinating. Cognition and memory are normal. She exhibits a depressed mood. She expresses no homicidal and no suicidal ideation. She expresses no suicidal plans and no homicidal plans.  Slowed speech She is attentive.  Nursing note and vitals reviewed.      Assessment:     Major Depressive Disorder Morbid Obesity Vit D deficiency Dysphagia HTN    Plan:     Check problem list.

## 2017-08-14 NOTE — Assessment & Plan Note (Signed)
Diet and exercise counseling done. I recommended referral to nutritionist. However she will like to hold that for now. Continue diet control and exercise as tolerated.

## 2017-08-14 NOTE — Assessment & Plan Note (Signed)
S/P flare and was hospitalized in [place]. Release of record form signed. I recommended going back to Zoloft 150 mg qd. Psych referral recommended. I placed referral. I also gave her list of Psych in town for her to schedule appointment, which ever comes first. She agreed with the plan and verbalized understanding.

## 2017-08-15 ENCOUNTER — Telehealth: Payer: Self-pay | Admitting: Family Medicine

## 2017-08-15 LAB — VITAMIN D 25 HYDROXY (VIT D DEFICIENCY, FRACTURES): VIT D 25 HYDROXY: 35.6 ng/mL (ref 30.0–100.0)

## 2017-08-15 NOTE — Telephone Encounter (Signed)
Unable to leave a message.   If she calls, please let her know that her Vit D level is normal and that she can start OTC vitamin D supplement daily.

## 2017-08-16 ENCOUNTER — Encounter: Payer: Self-pay | Admitting: Family Medicine

## 2017-08-16 ENCOUNTER — Other Ambulatory Visit: Payer: Self-pay | Admitting: Family Medicine

## 2017-08-16 DIAGNOSIS — I509 Heart failure, unspecified: Secondary | ICD-10-CM

## 2017-08-16 NOTE — Progress Notes (Signed)
I received and reviewed her recent hospital admission records.  Pertinent reports:  Reason for admission: SOB, Chest pain and Depression. She was missing for 3 days and then went to her sisters house in [place] where she was taken to the ED.  1. Chest xray normal. 2. CT chest ruled out PE, + cardiomegaly and Pulm HTN.+ Fatty liver. 3. A1C 5.8 4. Hypokalemia: Initial K+ of 3.3 then 3.1 and 3.6 at discharge. 5. Discharge summary suggested that she had heart failure. No ECHo report seen. No documentation of heart failure treatment.  I will order ECHO to evaluate.

## 2017-08-16 NOTE — Progress Notes (Signed)
Hospital discharge report from [place] suggested that she has CHF. ECHO needed to evaluate.

## 2017-08-17 ENCOUNTER — Telehealth: Payer: Self-pay

## 2017-08-17 NOTE — Telephone Encounter (Signed)
Thanks page.

## 2017-08-17 NOTE — Telephone Encounter (Signed)
  Andrea [name] Female, 62 y.o., [birth year] MRN:  [id number] Phone:  [phone] [name]) PCP:  [name], MD Coverage:  [place] [employer] Next Appt With [name], MD 09/11/2017 at 11:10 AM Patient Calls   [name], [name], MD  [facility] 21 hours ago (11:44 AM)    Please help this patient schedule ECHO   Routing comment     [name], MD 21 hours ago (11:43 AM)      Hospital discharge report from [place] suggested that she has CHF. ECHO needed to evaluate.      Documentation

## 2017-08-17 NOTE — Telephone Encounter (Signed)
ECHO scheduled for 6/27 @1pm  at [facility]. Attempted to contact pt to inform. "The number can not be completed as dialed." This is the only number in chart. Will continue to try and make pt aware of ECHO.

## 2017-08-23 ENCOUNTER — Ambulatory Visit (HOSPITAL_COMMUNITY): Admission: RE | Admit: 2017-08-23 | Payer: BC Managed Care – PPO | Source: Ambulatory Visit

## 2017-08-28 ENCOUNTER — Ambulatory Visit (HOSPITAL_COMMUNITY)
Admission: RE | Admit: 2017-08-28 | Discharge: 2017-08-28 | Disposition: A | Discharge status: Home / Self Care | Payer: BC Managed Care – PPO | Source: Ambulatory Visit | Attending: Family Medicine | Admitting: Family Medicine

## 2017-08-28 DIAGNOSIS — R002 Palpitations: Secondary | ICD-10-CM | POA: Diagnosis not present

## 2017-08-28 DIAGNOSIS — I110 Hypertensive heart disease with heart failure: Secondary | ICD-10-CM | POA: Diagnosis not present

## 2017-08-28 DIAGNOSIS — I509 Heart failure, unspecified: Secondary | ICD-10-CM | POA: Diagnosis not present

## 2017-08-28 DIAGNOSIS — Z6841 Body Mass Index (BMI) 40.0 and over, adult: Secondary | ICD-10-CM | POA: Insufficient documentation

## 2017-08-28 DIAGNOSIS — I082 Rheumatic disorders of both aortic and tricuspid valves: Secondary | ICD-10-CM | POA: Insufficient documentation

## 2017-08-28 DIAGNOSIS — E785 Hyperlipidemia, unspecified: Secondary | ICD-10-CM | POA: Diagnosis not present

## 2017-08-28 NOTE — Progress Notes (Signed)
  Echocardiogram 2D Echocardiogram has been performed.  Andrea [name] 08/28/2017, 2:13 PM

## 2017-08-29 ENCOUNTER — Encounter: Payer: Self-pay | Admitting: Family Medicine

## 2017-09-11 ENCOUNTER — Encounter: Payer: Self-pay | Admitting: Family Medicine

## 2017-09-11 ENCOUNTER — Ambulatory Visit: Payer: BC Managed Care – PPO | Admitting: Family Medicine

## 2017-09-11 ENCOUNTER — Other Ambulatory Visit: Payer: Self-pay

## 2017-09-11 DIAGNOSIS — R1310 Dysphagia, unspecified: Secondary | ICD-10-CM

## 2017-09-11 DIAGNOSIS — I5032 Chronic diastolic (congestive) heart failure: Secondary | ICD-10-CM | POA: Diagnosis not present

## 2017-09-11 DIAGNOSIS — F331 Major depressive disorder, recurrent, moderate: Secondary | ICD-10-CM

## 2017-09-11 DIAGNOSIS — I5030 Unspecified diastolic (congestive) heart failure: Secondary | ICD-10-CM | POA: Insufficient documentation

## 2017-09-11 DIAGNOSIS — I10 Essential (primary) hypertension: Secondary | ICD-10-CM | POA: Diagnosis not present

## 2017-09-11 MED ORDER — VITAMIN D3 75 MCG (3000 UT) PO TABS: 1.0000 | ORAL_TABLET | Freq: Every day | ORAL | 99 refills | Status: DC

## 2017-09-11 MED ORDER — CARVEDILOL 6.25 MG PO TABS: 6.2500 mg | ORAL_TABLET | Freq: Two times a day (BID) | ORAL | 3 refills | Status: DC

## 2017-09-11 NOTE — Progress Notes (Signed)
Subjective:     Patient ID: Andrea [name], female   DOB: 07/13/1955, 62 y.o.   MRN: [id number]  HPI HTN: here for follow-up. She is currently only on Maxzide 37.5/25 mg. She does not monitor BP at home. Not taking Norvasc Depression: Seen therapist Eddie North) twice at [facility]. She feels better with medication and therapy. She is on Zoloft 150 mg qd. CHF:No SOB but some tightness in her lungs. Sometimes she feels sharp needles in her lungs. Breathing exercise helps. Denies hx of tobacco use, denies cough, no wheezing. Swallow difficulty:Patient stated she is feeling better and denies issues with swallowing since last visit. No other GI concern. She is compliant with Zantac. Form: Sister has a foster child in the house, hence she needed to complete a medical evaluation form for the [place] DSS.  Current Outpatient Medications on File Prior to Visit  Medication Sig Dispense Refill  . aspirin (ASPIRIN CHILDRENS) 81 MG chewable tablet Chew 81 mg by mouth daily.      [name] levothyroxine (SYNTHROID, LEVOTHROID) 137 MCG tablet Take 1 tablet (137 mcg total) by mouth daily before breakfast. 90 tablet 1  . loratadine (CLARITIN) 10 MG tablet Take 1 tablet (10 mg total) by mouth daily. 90 tablet 1  . ranitidine (ZANTAC) 150 MG capsule Take 1 capsule (150 mg total) by mouth 2 (two) times daily. 60 capsule 1  . sertraline (ZOLOFT) 50 MG tablet TAKE 3 TABLETS BY MOUTH ONCE DAILY 90 tablet 1  . simvastatin (ZOCOR) 40 MG tablet Take 1 tablet (40 mg total) by mouth at bedtime. 90 tablet 1  . triamterene-hydrochlorothiazide (MAXZIDE-25) 37.5-25 MG tablet Take 1 tablet by mouth daily. 90 tablet 0  . amLODipine (NORVASC) 5 MG tablet Take 1 tablet (5 mg total) by mouth daily. (Patient not taking: Reported on 08/14/2017) 90 tablet 1  . Blood Pressure Monitoring (BLOOD PRESSURE MONITOR/L CUFF) MISC Check blood pressure 3 times per week. (Patient not taking: Reported on 02/21/2017) 1 each 0  . naproxen (EC  NAPROSYN) 500 MG EC tablet Take 1 tablet (500 mg total) by mouth 2 (two) times daily with a meal. (Patient not taking: Reported on 08/14/2017) 30 tablet 0   No current facility-administered medications on file prior to visit.    Past Medical History:  Diagnosis Date  . Allergic rhinitis 06/22/2014  . Arm pain, lateral, left 04/28/2016  . Carpal tunnel syndrome 04/26/2006   Qualifier: Diagnosis of  By: Dorathy Daft MD, Marjory Lies    . GANGLION CYST, WRIST, RIGHT 10/06/2008   Qualifier: Diagnosis of  By: Drue Flirt  MD, Merrily Brittle    . Right wrist pain 04/28/2016   Vitals:   09/11/17 1045 09/11/17 1111 09/11/17 1112  BP: (!) 132/92 (!) 154/100 140/90  Pulse: 76    Temp: 98.1 F (36.7 C)    TempSrc: Oral    SpO2: 97%    Weight: 246 lb (111.6 kg)       Review of Systems  Respiratory: Negative.   Cardiovascular: Negative.   Gastrointestinal: Negative.   Neurological: Negative.   All other systems reviewed and are negative.      Objective:   Physical Exam  Constitutional: She is oriented to person, place, and time. She appears well-developed. No distress.  Cardiovascular: Normal rate, regular rhythm and normal heart sounds.  No murmur heard. Pulmonary/Chest: Effort normal and breath sounds normal. No stridor. No respiratory distress. She has no wheezes.  Abdominal: Soft. Bowel sounds are normal. She exhibits no distension and no  mass. There is no tenderness. There is no guarding. No hernia.  Musculoskeletal: Normal range of motion. She exhibits no edema.  Neurological: She is alert and oriented to person, place, and time. No cranial nerve deficit. Coordination normal.  Skin: Skin is warm.  Psychiatric: She has a normal mood and affect. Her behavior is normal. Judgment and thought content normal.  Nursing note and vitals reviewed.        Assessment:     HTN:  Depression: . CHF: Swallow difficulty: Form:    Plan:     Check problem list.  Form reviewed and completed. Patient  answered all TB related questions as listed on the form. No indication for TB exposure. DSS may request PPD if required by them, but per form, it is not required at this time.

## 2017-09-11 NOTE — Assessment & Plan Note (Signed)
BP elevated. D/C Norvasc. COntinue Maxzide. Start Coreg 6.25 mg BID. Monitor BP at  Home closely. Parameters given. F?U in 2 weeks for reassessment.

## 2017-09-11 NOTE — Assessment & Plan Note (Signed)
ECHO result discussed with her. CUrrently asymptomatic. We will ensure adequate BP control. Add coreg to regimen. Already on ARBs.

## 2017-09-11 NOTE — Assessment & Plan Note (Signed)
Improved a lot since last visit on Meds and Therapy. Not suicidal or Homicidal. F/U with Psychologist and Psych as scheduled. F/U as needed.

## 2017-09-11 NOTE — Patient Instructions (Addendum)
It was nice seeing you today. You BP is still running high. Stop your Amlodine. Continue combined HCTZ and Triamterene. I will also start you on Coreg for your heart and your BP. Please check BP 3-5 times a week and return with your BP log in 2 weeks. If BP is less than 100/50, please call or if you feel weak, dizzy or tired with low BP, please call.  Blood Pressure Record Sheet Your blood pressure on this visit to the emergency department or clinic is elevated. This does not necessarily mean you have high blood pressure (hypertension), but it does mean that your blood pressure needs to be rechecked. Many times your blood pressure can increase due to illness, pain, anxiety, or other factors. We recommend that you get a series of blood pressure readings done over a period of 5 days. It is best to get a reading in the morning and one in the evening. You should make sure to sit and relax for 1-5 minutes before the reading is taken. Write the readings down and make a follow-up appointment with your health care provider to discuss the results. If there is not a free clinic or a drug store with a blood-pressure-taking machine near you, you can purchase blood-pressure-taking equipment from a drug store. Having one in the home allows you the convenience of taking your blood pressure while you are home and relaxed. Blood Pressure Log Date: _______________________  a.m. _____________________  p.m. _____________________  Date: _______________________  a.m. _____________________  p.m. _____________________  Date: _______________________  a.m. _____________________  p.m. _____________________  Date: _______________________  a.m. _____________________  p.m. _____________________  Date: _______________________  a.m. _____________________  p.m. _____________________  This information is not intended to replace advice given to you by your health care provider. Make sure you discuss any questions  you have with your health care provider. Document Released: 11/12/2002 Document Revised: 01/28/2016 Document Reviewed: 04/08/2013 Elsevier Interactive Patient Education  2018 [employer].

## 2017-09-11 NOTE — Assessment & Plan Note (Signed)
Denies issues since she started Zantac. We will hold off on Swallow eval for now. Return soon if experiencing difficulty with swallowing again.

## 2017-10-01 ENCOUNTER — Encounter: Payer: Self-pay | Admitting: Family Medicine

## 2017-10-02 ENCOUNTER — Other Ambulatory Visit: Payer: Self-pay

## 2017-10-02 ENCOUNTER — Encounter: Payer: Self-pay | Admitting: Family Medicine

## 2017-10-02 ENCOUNTER — Ambulatory Visit: Payer: BC Managed Care – PPO | Admitting: Family Medicine

## 2017-10-02 VITALS — BP 130/100 | HR 71 | Temp 98.2°F | Wt 245.0 lb

## 2017-10-02 DIAGNOSIS — Z90710 Acquired absence of both cervix and uterus: Secondary | ICD-10-CM | POA: Diagnosis not present

## 2017-10-02 DIAGNOSIS — F331 Major depressive disorder, recurrent, moderate: Secondary | ICD-10-CM

## 2017-10-02 DIAGNOSIS — I10 Essential (primary) hypertension: Secondary | ICD-10-CM

## 2017-10-02 NOTE — Progress Notes (Signed)
Subjective:     Patient ID: Andrea [name], female   DOB: [birth year], 62 y.o.   MRN: [id number]  HPI HTN: She is compliant with her meds most of the time. She stated at time she forgets to take her night time Coreg. She did not take any of her BP meds this morning. Her home BP range are normal within range. Depression: Last week she found herself crying. Next with Psychologist is appointment Monday. She denies suicidal or homicidal ideation. She stated that she is doing well on her meds and with counseling. RS:WNIOEVO PAP.  Current Outpatient Medications on File Prior to Visit  Medication Sig Dispense Refill  . aspirin (ASPIRIN CHILDRENS) 81 MG chewable tablet Chew 81 mg by mouth daily.      . carvedilol (COREG) 6.25 MG tablet Take 1 tablet (6.25 mg total) by mouth 2 (two) times daily with a meal. 60 tablet 3  . Cholecalciferol (VITAMIN D3) 3000 units TABS Take 1 tablet by mouth daily. 30 tablet prn  . levothyroxine (SYNTHROID, LEVOTHROID) 137 MCG tablet Take 1 tablet (137 mcg total) by mouth daily before breakfast. 90 tablet 1  . ranitidine (ZANTAC) 150 MG capsule Take 1 capsule (150 mg total) by mouth 2 (two) times daily. 60 capsule 1  . sertraline (ZOLOFT) 50 MG tablet TAKE 3 TABLETS BY MOUTH ONCE DAILY 90 tablet 1  . simvastatin (ZOCOR) 40 MG tablet Take 1 tablet (40 mg total) by mouth at bedtime. 90 tablet 1  . triamterene-hydrochlorothiazide (MAXZIDE-25) 37.5-25 MG tablet Take 1 tablet by mouth daily. 90 tablet 0  . Blood Pressure Monitoring (BLOOD PRESSURE MONITOR/L CUFF) MISC Check blood pressure 3 times per week. (Patient not taking: Reported on 02/21/2017) 1 each 0  . loratadine (CLARITIN) 10 MG tablet Take 1 tablet (10 mg total) by mouth daily. 90 tablet 1  . naproxen (EC NAPROSYN) 500 MG EC tablet Take 1 tablet (500 mg total) by mouth 2 (two) times daily with a meal. (Patient not taking: Reported on 08/14/2017) 30 tablet 0   No current facility-administered medications on file  prior to visit.    Past Medical History:  Diagnosis Date  . Abnormal mammogram 08/28/2014   08/28/14 - Probable benign microcalcifications over the outer mid to lower left breast. Bi-Rads 3. F/U 6 months   . Allergic rhinitis 06/22/2014  . Arm pain, lateral, left 04/28/2016  . Carpal tunnel syndrome 04/26/2006   Qualifier: Diagnosis of  By: Dorathy Daft MD, Marjory Lies    . GANGLION CYST, WRIST, RIGHT 10/06/2008   Qualifier: Diagnosis of  By: Drue Flirt  MD, Merrily Brittle    . Right wrist pain 04/28/2016     Vitals:   10/02/17 0832 10/02/17 0846  BP: (!) 133/92 (!) 130/100  Pulse: 71   Temp: 98.2 F (36.8 C)   TempSrc: Oral   SpO2: 98%   Weight: 245 lb (111.1 kg)     Review of Systems  Respiratory: Negative.   Cardiovascular: Negative.   Gastrointestinal: Negative.   Neurological: Negative.   All other systems reviewed and are negative.      Objective:   Physical Exam  Constitutional: She is oriented to person, place, and time. She appears well-developed.  Cardiovascular: Normal rate, regular rhythm and normal heart sounds.  No murmur heard. Pulmonary/Chest: Effort normal and breath sounds normal. No stridor. No respiratory distress. She has no wheezes.  Abdominal: Soft. Bowel sounds are normal. She exhibits no distension and no mass. There is no tenderness.  Musculoskeletal: Normal range  of motion. She exhibits no edema.  Neurological: She is alert and oriented to person, place, and time. No cranial nerve deficit. Coordination normal.  Psychiatric: She has a normal mood and affect. Her behavior is normal. Judgment and thought content normal. She expresses no homicidal and no suicidal ideation. She expresses no suicidal plans and no homicidal plans.  Nursing note and vitals reviewed.      Assessment:     HTN Depression Pelvic Exam/Health maintenance    Plan:     Check problem list. For HM she has not had PAP due to hysterectomy. However, per record, it looks like she might still have a  cervix. She is advised to schedule f/u in 2 weeks for Gyn exam.

## 2017-10-02 NOTE — Assessment & Plan Note (Signed)
No acute change in symptoms. F/U psych as scheduled.

## 2017-10-02 NOTE — Patient Instructions (Signed)
It was nice seeing you today. Please take your BP medication as instructed. I will like to see you back in 2 weeks for pelvic exam. Please schedule follow-up today.

## 2017-10-02 NOTE — Assessment & Plan Note (Signed)
Poor adherence to coreg. Counseling done. May take night dose at 5 or 6 pm. Continue home BP monitoring. Call if running too high or low or symptomatic.

## 2017-10-08 ENCOUNTER — Other Ambulatory Visit: Payer: Self-pay | Admitting: Family Medicine

## 2017-10-16 ENCOUNTER — Ambulatory Visit: Payer: BC Managed Care – PPO | Admitting: Family Medicine

## 2017-10-16 ENCOUNTER — Other Ambulatory Visit: Payer: Self-pay

## 2017-10-16 ENCOUNTER — Telehealth: Payer: Self-pay

## 2017-10-16 ENCOUNTER — Encounter: Payer: Self-pay | Admitting: Family Medicine

## 2017-10-16 ENCOUNTER — Other Ambulatory Visit (HOSPITAL_COMMUNITY)
Admission: RE | Admit: 2017-10-16 | Discharge: 2017-10-16 | Disposition: A | Discharge status: Home / Self Care | Payer: BC Managed Care – PPO | Source: Ambulatory Visit | Attending: Family Medicine | Admitting: Family Medicine

## 2017-10-16 VITALS — BP 130/82 | HR 66 | Temp 98.5°F | Wt 250.0 lb

## 2017-10-16 DIAGNOSIS — Z90710 Acquired absence of both cervix and uterus: Secondary | ICD-10-CM | POA: Insufficient documentation

## 2017-10-16 DIAGNOSIS — Z01419 Encounter for gynecological examination (general) (routine) without abnormal findings: Secondary | ICD-10-CM

## 2017-10-16 DIAGNOSIS — N898 Other specified noninflammatory disorders of vagina: Secondary | ICD-10-CM

## 2017-10-16 DIAGNOSIS — Z1151 Encounter for screening for human papillomavirus (HPV): Secondary | ICD-10-CM | POA: Insufficient documentation

## 2017-10-16 DIAGNOSIS — Z124 Encounter for screening for malignant neoplasm of cervix: Secondary | ICD-10-CM | POA: Diagnosis not present

## 2017-10-16 DIAGNOSIS — Z1272 Encounter for screening for malignant neoplasm of vagina: Secondary | ICD-10-CM | POA: Insufficient documentation

## 2017-10-16 LAB — POCT WET PREP (WET MOUNT)
Clue Cells Wet Prep Whiff POC: NEGATIVE
Trichomonas Wet Prep HPF POC: ABSENT

## 2017-10-16 NOTE — Progress Notes (Signed)
Patient ID: Kathrin Greathouse, female   DOB: Jul 23, 1955, 62 y.o.   MRN: [id number] Subjective:     Samari A Jolliffe is a 62 y.o. female here for a routine exam.  Current complaints: Vaginal discharge and bubbles in her vagina.  Personal health questionnaire reviewed: not asked.   Gynecologic History No LMP recorded. Patient has had a hysterectomy. Contraception: none Last Pap: 2005. Results were: normal Last mammogram: 2018. Results were: normal  Obstetric History OB History  No data available     The following portions of the patient's history were reviewed and updated as appropriate: allergies, current medications, past family history, past medical history, past social history, past surgical history and problem list.  Review of Systems Pertinent items noted in HPI and remainder of comprehensive ROS otherwise negative.    Objective:    BP 130/82   Pulse 66   Temp 98.5 F (36.9 C) (Oral)   Wt 250 lb (113.4 kg)   SpO2 98%   BMI 44.29 kg/m  General appearance: alert, cooperative and appears stated age Head: Normocephalic, without obvious abnormality, atraumatic Lungs: clear to auscultation bilaterally Heart: regular rate and rhythm, S1, S2 normal, no murmur, click, rub or gallop Abdomen: soft, non-tender; bowel sounds normal; no masses,  no organomegaly Pelvic: external genitalia normal, no adnexal masses or tenderness and cervix absent. No GU polyps or mass Extremities: extremities normal, atraumatic, no cyanosis or edema Neurologic: Alert and oriented X 3, normal strength and tone. Normal symmetric reflexes. Normal coordination and gait    Assessment:    Healthy female exam.   Vaginitis    Plan:    Education reviewed: Gyn exam. Contraception: none. Follow up in: 1 year.     On exam, GU discharge looks normal. Wet prep neg for infection. F/U as needed. I will contact her soon with her PAP result. If normal, she will not need to get PAP done again. Return for  flu shot in 1-2 weeks.  More than 50% of this 30 min face to face encounter was spent on counseling, procedure and coordination of care.

## 2017-10-16 NOTE — Telephone Encounter (Signed)
-----   Message from [name], MD sent at 10/16/2017  3:04 PM EDT ----- Please call to inform patient that her wet prep is neg for infection. I will contact her with PAP result.

## 2017-10-16 NOTE — Patient Instructions (Signed)

## 2017-10-16 NOTE — Telephone Encounter (Signed)
LVM for pt to call me back to inform of negative test results.

## 2017-10-18 ENCOUNTER — Telehealth: Payer: Self-pay | Admitting: Family Medicine

## 2017-10-18 LAB — CYTOLOGY - PAP
DIAGNOSIS: NEGATIVE
HPV: NOT DETECTED

## 2017-10-18 NOTE — Telephone Encounter (Signed)
Will mail patient a letter once all results are in.  [facility]

## 2017-10-18 NOTE — Telephone Encounter (Signed)
I called both phone number listed. HIPAA compliant call back message left. When she calls, let her know that PAP test is normal.

## 2017-10-30 NOTE — Telephone Encounter (Signed)
LM for patient ok per DPR that pap is normal.  Vignesh Willert,CMA

## 2017-10-30 NOTE — Telephone Encounter (Signed)
Pt called and said her phone is fixed and you can reach her at Christie, RN

## 2017-11-20 ENCOUNTER — Other Ambulatory Visit: Payer: Self-pay | Admitting: Family Medicine

## 2017-12-10 ENCOUNTER — Other Ambulatory Visit: Payer: Self-pay | Admitting: Family Medicine

## 2018-03-25 ENCOUNTER — Other Ambulatory Visit: Payer: Self-pay | Admitting: Family Medicine

## 2018-03-25 DIAGNOSIS — Z1231 Encounter for screening mammogram for malignant neoplasm of breast: Secondary | ICD-10-CM

## 2018-03-28 ENCOUNTER — Other Ambulatory Visit: Payer: Self-pay | Admitting: Family Medicine

## 2018-04-22 ENCOUNTER — Ambulatory Visit
Admission: RE | Admit: 2018-04-22 | Discharge: 2018-04-22 | Disposition: A | Discharge status: Home / Self Care | Payer: BC Managed Care – PPO | Source: Ambulatory Visit | Attending: Family Medicine | Admitting: Family Medicine

## 2018-04-22 DIAGNOSIS — Z1231 Encounter for screening mammogram for malignant neoplasm of breast: Secondary | ICD-10-CM

## 2018-04-24 ENCOUNTER — Telehealth: Payer: Self-pay | Admitting: *Deleted

## 2018-04-24 NOTE — Telephone Encounter (Signed)
I called but unable to reach her. I imagine she would have gone to the ED. Message left for her to call me back.

## 2018-04-24 NOTE — Telephone Encounter (Signed)
Patient calls because she is dizzy and "off balance".  She states that her sister thinks she had a stroke because "her eye is drooping".  Advised to call 911 or have someone take her to ED immediately.  Pt agreeable with plan.  Fleeger, Salome Spotted, CMA

## 2018-05-10 ENCOUNTER — Ambulatory Visit: Payer: BC Managed Care – PPO | Admitting: Family Medicine

## 2018-05-18 ENCOUNTER — Other Ambulatory Visit: Payer: Self-pay | Admitting: Family Medicine

## 2018-05-29 ENCOUNTER — Other Ambulatory Visit: Payer: Self-pay

## 2018-05-29 MED ORDER — FAMOTIDINE 20 MG PO TABS: 20.0000 mg | ORAL_TABLET | Freq: Every day | ORAL | 1 refills | Status: DC

## 2018-06-04 ENCOUNTER — Ambulatory Visit: Payer: BC Managed Care – PPO | Admitting: Family Medicine

## 2018-08-11 ENCOUNTER — Other Ambulatory Visit: Payer: Self-pay | Admitting: Family Medicine

## 2018-08-16 ENCOUNTER — Encounter: Payer: Self-pay | Admitting: Gastroenterology

## 2018-08-23 ENCOUNTER — Ambulatory Visit: Payer: BC Managed Care – PPO | Admitting: Family Medicine

## 2018-08-23 ENCOUNTER — Other Ambulatory Visit: Payer: Self-pay

## 2018-08-23 ENCOUNTER — Encounter: Payer: Self-pay | Admitting: Family Medicine

## 2018-08-23 VITALS — BP 140/90 | HR 67 | Ht 63.0 in | Wt 258.1 lb

## 2018-08-23 DIAGNOSIS — I10 Essential (primary) hypertension: Secondary | ICD-10-CM | POA: Diagnosis not present

## 2018-08-23 DIAGNOSIS — E1169 Type 2 diabetes mellitus with other specified complication: Secondary | ICD-10-CM | POA: Diagnosis not present

## 2018-08-23 DIAGNOSIS — E559 Vitamin D deficiency, unspecified: Secondary | ICD-10-CM

## 2018-08-23 DIAGNOSIS — F331 Major depressive disorder, recurrent, moderate: Secondary | ICD-10-CM

## 2018-08-23 DIAGNOSIS — E039 Hypothyroidism, unspecified: Secondary | ICD-10-CM

## 2018-08-23 DIAGNOSIS — E785 Hyperlipidemia, unspecified: Secondary | ICD-10-CM

## 2018-08-23 DIAGNOSIS — I5032 Chronic diastolic (congestive) heart failure: Secondary | ICD-10-CM

## 2018-08-23 NOTE — Assessment & Plan Note (Signed)
Checked vit D levels today. Continue supplements.

## 2018-08-23 NOTE — Assessment & Plan Note (Signed)
Checked FLP today. Continue Zocor 40 mg qd.

## 2018-08-23 NOTE — Progress Notes (Signed)
Subjective:     Patient ID: Andrea [name], female   DOB: 10-04-1955, 63 y.o.   MRN: [id number]  HPI Weight check:She had not been able to exercise much since COVID-19. HTN/CHF/HLD:Compliant with medical management. She denies chest pain, no SOB, no headache, no leg swelling. Doing well in general. Hypothyroidism: Compliant with her meds.  Vit D:Here for f/u./ Depression: Feels well in general. Here for f/u.  Current Outpatient Medications on File Prior to Visit  Medication Sig Dispense Refill  . aspirin (ASPIRIN CHILDRENS) 81 MG chewable tablet Chew 81 mg by mouth daily.      . Blood Pressure Monitoring (BLOOD PRESSURE MONITOR/L CUFF) MISC Check blood pressure 3 times per week. 1 each 0  . carvedilol (COREG) 6.25 MG tablet TAKE 1 TABLET BY MOUTH TWICE DAILY WITH A MEAL 180 tablet 1  . Cholecalciferol (VITAMIN D3) 3000 units TABS Take 1 tablet by mouth daily. 30 tablet prn  . famotidine (PEPCID) 20 MG tablet Take 1 tablet (20 mg total) by mouth daily. 90 tablet 1  . levothyroxine (SYNTHROID, LEVOTHROID) 137 MCG tablet TAKE 1 TABLET BY MOUTH ONCE DAILY BEFORE BREAKFAST 90 tablet 3  . loratadine (CLARITIN) 10 MG tablet TAKE 1 TABLET BY MOUTH ONCE DAILY 90 tablet 1  . sertraline (ZOLOFT) 50 MG tablet Take 3 tablets by mouth once daily 90 tablet 4  . simvastatin (ZOCOR) 40 MG tablet TAKE 1 TABLET BY MOUTH AT BEDTIME 90 tablet 1  . triamterene-hydrochlorothiazide (MAXZIDE-25) 37.5-25 MG tablet TAKE 1 TABLET BY MOUTH ONCE DAILY 90 tablet 2   No current facility-administered medications on file prior to visit.    Past Medical History:  Diagnosis Date  . Abnormal mammogram 08/28/2014   08/28/14 - Probable benign microcalcifications over the outer mid to lower left breast. Bi-Rads 3. F/U 6 months   . Allergic rhinitis 06/22/2014  . Arm pain, lateral, left 04/28/2016  . Carpal tunnel syndrome 04/26/2006   Qualifier: Diagnosis of  By: Dorathy Daft MD, Marjory Lies    . GANGLION CYST, WRIST, RIGHT 10/06/2008    Qualifier: Diagnosis of  By: Drue Flirt  MD, Merrily Brittle    . Right wrist pain 04/28/2016   Vitals:   08/23/18 0949  BP: 140/90  Pulse: 67  SpO2: 97%  Weight: 258 lb 2 oz (117.1 kg)  Height: 5\' 3"  (1.6 m)     Review of Systems  Respiratory: Negative.   Cardiovascular: Negative.   Gastrointestinal: Negative.   Musculoskeletal: Negative.   Neurological: Negative.   Psychiatric/Behavioral: Negative.   All other systems reviewed and are negative.      Objective:   Physical Exam Vitals signs and nursing note reviewed.  Constitutional:      Appearance: She is not ill-appearing.  Neck:     Musculoskeletal: Neck supple.  Cardiovascular:     Rate and Rhythm: Normal rate and regular rhythm.     Heart sounds: Normal heart sounds. No murmur.  Pulmonary:     Effort: Pulmonary effort is normal. No respiratory distress.     Breath sounds: Normal breath sounds. No wheezing or rhonchi.  Abdominal:     General: Abdomen is flat. Bowel sounds are normal. There is no distension.     Palpations: There is no mass.     Tenderness: There is no abdominal tenderness.  Musculoskeletal:     Right lower leg: No edema.     Left lower leg: No edema.  Neurological:     General: No focal deficit present.  Mental Status: She is alert and oriented to person, place, and time.  Psychiatric:        Mood and Affect: Mood normal.        Behavior: Behavior normal.        Thought Content: Thought content normal.        Judgment: Judgment normal.   No suicidal ideation       Office Visit from 08/23/2018 in [place]  PHQ-9 Total Score  3      Body mass index is 45.72 kg/m.   Assessment:     Weight check:  HTN HLD CHF: Hypothyroidism: Vit D: Depression:    Plan:     Check problem list

## 2018-08-23 NOTE — Patient Instructions (Signed)

## 2018-08-23 NOTE — Assessment & Plan Note (Signed)
See PHQ9. She seems stable on Zoloft, 150 mg qd. No S/E to meds. She is doing well in general.

## 2018-08-23 NOTE — Assessment & Plan Note (Signed)
Check lab today. Continue current dose of Synthroid which she claimed compliance with.

## 2018-08-23 NOTE — Assessment & Plan Note (Signed)
BP mildly elevated today. We will monitor closely. Check labs today.

## 2018-08-23 NOTE — Assessment & Plan Note (Signed)
No acute change from her baseline.

## 2018-08-23 NOTE — Assessment & Plan Note (Signed)
Gained few more pounds since last visit due to lack of exercise. Diet and calorie counting discussed. Monitor closely.

## 2018-08-24 LAB — TSH: TSH: 0.389 u[IU]/mL — ABNORMAL LOW (ref 0.450–4.500)

## 2018-08-24 LAB — BASIC METABOLIC PANEL
BUN/Creatinine Ratio: 22 (ref 12–28)
BUN: 20 mg/dL (ref 8–27)
CO2: 25 mmol/L (ref 20–29)
Calcium: 9.3 mg/dL (ref 8.7–10.3)
Chloride: 101 mmol/L (ref 96–106)
Creatinine, Ser: 0.9 mg/dL (ref 0.57–1.00)
GFR calc Af Amer: 79 mL/min/{1.73_m2} (ref >59)
GFR calc non Af Amer: 69 mL/min/{1.73_m2} (ref >59)
Glucose: 88 mg/dL (ref 65–99)
Potassium: 3.7 mmol/L (ref 3.5–5.2)
Sodium: 140 mmol/L (ref 134–144)

## 2018-08-24 LAB — LIPID PANEL
Chol/HDL Ratio: 5.9 ratio — ABNORMAL HIGH (ref 0.0–4.4)
Cholesterol, Total: 235 mg/dL — ABNORMAL HIGH (ref 100–199)
HDL: 40 mg/dL (ref >39)
LDL Calculated: 164 mg/dL — ABNORMAL HIGH (ref 0–99)
Triglycerides: 155 mg/dL — ABNORMAL HIGH (ref 0–149)
VLDL Cholesterol Cal: 31 mg/dL (ref 5–40)

## 2018-08-24 LAB — VITAMIN D 25 HYDROXY (VIT D DEFICIENCY, FRACTURES): Vit D, 25-Hydroxy: 22.4 ng/mL — ABNORMAL LOW (ref 30.0–100.0)

## 2018-08-26 ENCOUNTER — Telehealth: Payer: Self-pay | Admitting: Family Medicine

## 2018-08-26 MED ORDER — VITAMIN D (ERGOCALCIFEROL) 1.25 MG (50000 UNIT) PO CAPS: 50000.0000 [IU] | ORAL_CAPSULE | ORAL | 1 refills | Status: DC

## 2018-08-26 NOTE — Telephone Encounter (Signed)
Result discussed with the patient. She stated she has been taking all of her medications except for the cholesterol medication. She had not taken her Simvastatin in months since she sleeps off at night and forgets to take it. She did not give me this information during her last visit.  She agreed to restart her Simvastatin and she will take it at 6 PM in stead.  Since she had done well on her Synthroid since 2014, I recommended continuation of same dose for now and recheck lab in 4 weeks. She stated that she prefers to do that and she will call back to schedule her appointment.  Start Vit D 50,000 unit qweekly and d/c qd OTC vitamin D. She verbalized understanding of all information given and she agreed with the plan.

## 2018-09-20 ENCOUNTER — Other Ambulatory Visit: Payer: Self-pay | Admitting: Family Medicine

## 2018-09-20 DIAGNOSIS — E039 Hypothyroidism, unspecified: Secondary | ICD-10-CM

## 2018-09-23 ENCOUNTER — Other Ambulatory Visit: Payer: Self-pay

## 2018-09-23 ENCOUNTER — Other Ambulatory Visit: Payer: BC Managed Care – PPO

## 2018-09-23 DIAGNOSIS — E039 Hypothyroidism, unspecified: Secondary | ICD-10-CM

## 2018-09-24 LAB — TSH: TSH: 0.966 u[IU]/mL (ref 0.450–4.500)

## 2018-11-20 ENCOUNTER — Other Ambulatory Visit: Payer: Self-pay | Admitting: Family Medicine

## 2018-11-21 ENCOUNTER — Other Ambulatory Visit: Payer: Self-pay | Admitting: Family Medicine

## 2018-12-19 ENCOUNTER — Encounter (HOSPITAL_COMMUNITY): Payer: Self-pay

## 2018-12-19 ENCOUNTER — Telehealth: Payer: Self-pay | Admitting: *Deleted

## 2018-12-19 ENCOUNTER — Other Ambulatory Visit: Payer: Self-pay

## 2018-12-19 ENCOUNTER — Ambulatory Visit (HOSPITAL_COMMUNITY)
Admission: EM | Admit: 2018-12-19 | Discharge: 2018-12-19 | Disposition: A | Discharge status: Home / Self Care | Payer: Self-pay | Attending: Internal Medicine | Admitting: Internal Medicine

## 2018-12-19 ENCOUNTER — Ambulatory Visit (INDEPENDENT_AMBULATORY_CARE_PROVIDER_SITE_OTHER): Payer: Self-pay

## 2018-12-19 DIAGNOSIS — R0789 Other chest pain: Secondary | ICD-10-CM

## 2018-12-19 MED ORDER — KETOROLAC TROMETHAMINE 30 MG/ML IJ SOLN
30.0000 mg | Freq: Once | INTRAMUSCULAR | Status: AC
  Administered 2018-12-19: 19:00:00 30 mg via INTRAMUSCULAR

## 2018-12-19 MED ORDER — IBUPROFEN 600 MG PO TABS: 600.0000 mg | ORAL_TABLET | Freq: Four times a day (QID) | ORAL | 0 refills | Status: DC | PRN

## 2018-12-19 MED ORDER — KETOROLAC TROMETHAMINE 30 MG/ML IJ SOLN
INTRAMUSCULAR | Status: AC
  Filled 2018-12-19: qty 1

## 2018-12-19 MED ORDER — KETOROLAC TROMETHAMINE 30 MG/ML IJ SOLN: 30.0000 mg | Freq: Once | INTRAMUSCULAR | Status: DC

## 2018-12-19 NOTE — Telephone Encounter (Signed)
Pt states that she fell 2 weeks ago and was having pain.  Her niece gave her some naproxen and now she endorses wheezing and pain with inhalation.  Advised to go to urgent care of ED to be checked out ( we have no openings)  Pt agreeable. Christen Bame, CMA

## 2018-12-19 NOTE — ED Triage Notes (Signed)
Patient presents to Urgent Care with complaints of chest pain since falling a few weeks ago. Patient reports he pain is in the center, had subsided a few days after her fall but then returned two days ago and then got worse yesterday when she got off work. Pt is visibly in pain with deep inspiration.  Pt endorses htn, denies cardiac surgery, dizziness, n/v.

## 2018-12-19 NOTE — Telephone Encounter (Signed)
Thanks

## 2018-12-19 NOTE — ED Provider Notes (Signed)
[place]    CSN: [id number] Arrival date & time: 12/19/18  1729      History   Chief Complaint Chief Complaint  Patient presents with  . Chest Pain    HPI Andrea [name] is a 63 y.o. female comes to urgent care with precordial chest pain of a few days duration.  Patient sustained a fall about 3 weeks ago.  She hit her head on the back of the head during the fall.  That has since improved and the scalp pain is resolved.  Chest pain started a few days ago.  Pain is severe-8 out of 10.  Pain is aggravated by palpation and taking a deep breath.  No known relieving factors.  Pain is sharp in nature.  No shortness of breath, diaphoresis, cough or sputum production.  No radiation of the pain.  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  Her activity involves carrying heavy objects and rearranging them. HPI  Past Medical History:  Diagnosis Date  . Abnormal mammogram 08/28/2014   08/28/14 - Probable benign microcalcifications over the outer mid to lower left breast. Bi-Rads 3. F/U 6 months   . Allergic rhinitis 06/22/2014  . Arm pain, lateral, left 04/28/2016  . Carpal tunnel syndrome 04/26/2006   Qualifier: Diagnosis of  By: Dorathy Daft MD, Marjory Lies    . GANGLION CYST, WRIST, RIGHT 10/06/2008   Qualifier: Diagnosis of  By: Drue Flirt  MD, Merrily Brittle    . Right wrist pain 04/28/2016    Patient Active Problem List   Diagnosis Date Noted  . Diastolic CHF ([place]) [id number]  . Dysphagia 08/14/2017  . Vitamin D deficiency 01/12/2017  . Imbalance 01/12/2017  . Palpitations 09/22/2013  . First degree AV block 09/22/2013  . Cataract 09/19/2013  . Hypothyroidism 04/26/2006  . Hyperlipidemia 04/26/2006  . Morbid obesity ([place]) 04/26/2006  . Major depressive disorder, recurrent episode ([place]) 04/26/2006  . HYPERTENSION, BENIGN SYSTEMIC 04/26/2006    Past Surgical History:  Procedure Laterality Date  . ABDOMINAL HYSTERECTOMY     fibroids     OB History   No obstetric history on file.      Home Medications    Prior to Admission medications   Medication Sig Start Date End Date Taking? Authorizing Provider  carvedilol (COREG) 6.25 MG tablet TAKE 1 TABLET BY MOUTH TWICE DAILY WITH A MEAL 11/21/18  Yes [name] T, MD  aspirin (ASPIRIN CHILDRENS) 81 MG chewable tablet Chew 81 mg by mouth daily.      [provider]  Blood Pressure Monitoring (BLOOD PRESSURE MONITOR/L CUFF) MISC Check blood pressure 3 times per week. 04/27/16   Lupita Dawn, MD  famotidine (PEPCID) 20 MG tablet Take 1 tablet (20 mg total) by mouth daily. 05/29/18   [name], MD  ibuprofen (ADVIL) 600 MG tablet Take 1 tablet (600 mg total) by mouth every 6 (six) hours as needed. 12/19/18   Hezzie Karim, Myrene Galas, MD  levothyroxine (SYNTHROID, LEVOTHROID) 137 MCG tablet TAKE 1 TABLET BY MOUTH ONCE DAILY BEFORE BREAKFAST 12/10/17   [name] T, MD  loratadine (CLARITIN) 10 MG tablet TAKE 1 TABLET BY MOUTH ONCE DAILY 11/21/18   [name], MD  sertraline (ZOLOFT) 50 MG tablet Take 3 tablets by mouth once daily 08/12/18   [name] T, MD  simvastatin (ZOCOR) 40 MG tablet TAKE 1 TABLET BY MOUTH AT BEDTIME 05/20/18   [name], MD  triamterene-hydrochlorothiazide [facility])  37.5-25 MG tablet Take 1 tablet by mouth once daily 11/21/18   [name] T, MD  Vitamin D, Ergocalciferol, (DRISDOL) 1.25 MG (50000 UT) CAPS capsule Take 1 capsule (50,000 Units total) by mouth every 7 (seven) days. 08/26/18   [name], MD    Family History Family History  Problem Relation Age of Onset  . Cancer Mother        cervial, pituitary, bone?  [name] Hypertension Mother   . Cancer Father   . Alcohol abuse Father   . Hypertension Sister   . Hypothyroidism Sister   . Cancer Brother        lung  . Hypertension Sister   . Hypothyroidism Sister   . Hypertension Sister   . Hypothyroidism Sister   . Hypertension Sister   .  Hypothyroidism Sister   . Hypertension Sister   . Hypothyroidism Sister     Social History Social History   Tobacco Use  . Smoking status: Never Smoker  . Smokeless tobacco: Never Used  Substance Use Topics  . Alcohol use: No  . Drug use: No     Allergies   Patient has no known allergies.   Review of Systems Review of Systems  Constitutional: Negative.   HENT: Negative.   Respiratory: Negative.  Negative for cough, chest tightness, shortness of breath and wheezing.   Cardiovascular: Positive for chest pain. Negative for palpitations.  Gastrointestinal: Negative.   Genitourinary: Negative.   Musculoskeletal: Negative.  Negative for arthralgias and joint swelling.  Skin: Negative.   Neurological: Negative.      Physical Exam Triage Vital Signs ED Triage Vitals  Enc Vitals Group     BP 12/19/18 1754 (!) 112/97     Pulse Rate 12/19/18 1754 76     Resp 12/19/18 1754 (!) 22     Temp 12/19/18 1754 98.6 F (37 C)     Temp Source 12/19/18 1754 Oral     SpO2 12/19/18 1754 96 %     Weight --      Height --      Head Circumference --      Peak Flow --      Pain Score 12/19/18 1751 9     Pain Loc --      Pain Edu? --      Excl. in [place]? --    No data found.  Updated Vital Signs BP (!) 112/97 (BP Location: Left Arm)   Pulse 76   Temp 98.6 F (37 C) (Oral)   Resp (!) 22   SpO2 96%   Visual Acuity Right Eye Distance:   Left Eye Distance:   Bilateral Distance:    Right Eye Near:   Left Eye Near:    Bilateral Near:     Physical Exam Constitutional:      General: She is in acute distress.     Appearance: She is well-developed.  Neck:     Musculoskeletal: Normal range of motion and neck supple.  Cardiovascular:     Rate and Rhythm: Normal rate and regular rhythm.     Heart sounds: Normal heart sounds. Heart sounds not distant. No murmur.  Pulmonary:     Effort: Pulmonary effort is normal. No tachypnea, accessory muscle usage or respiratory distress.      Comments: Reproducible tenderness over the costochondral area bilaterally. Chest:     Chest wall: Tenderness present. No deformity or crepitus.  Abdominal:     General: Bowel sounds are normal.  Musculoskeletal:  Normal range of motion.  Skin:    General: Skin is warm.     Capillary Refill: Capillary refill takes less than 2 seconds.  Neurological:     General: No focal deficit present.     Mental Status: She is alert and oriented to person, place, and time.      UC Treatments / Results  Labs (all labs ordered are listed, but only abnormal results are displayed) Labs Reviewed - No data to display  EKG   Radiology Dg Chest 2 View  Result Date: 12/19/2018 CLINICAL DATA:  Chest pain x 2 days. Constant opian but more with movement and breathing. Mid anterior pain. Hx of high blood pressure. Nonsmoker. No injury. Per provider:sternal pain and tenderness EXAM: CHEST - 2 VIEW COMPARISON:  None. FINDINGS: Heart size is normal. Lungs are free of focal consolidations and pleural effusions. IMPRESSION: No active cardiopulmonary disease. Electronically Signed   By: Nolon Nations M.D.   On: 12/19/2018 19:10    Procedures Procedures (including critical care time)  Medications Ordered in UC Medications  ketorolac (TORADOL) 30 MG/ML injection 30 mg (30 mg Intramuscular Given 12/19/18 1911)  ketorolac (TORADOL) 30 MG/ML injection (has no administration in time range)    Initial Impression / Assessment and Plan / UC Course  I have reviewed the triage vital signs and the nursing notes.  Pertinent labs & imaging results that were available during my care of the patient were reviewed by me and considered in my medical decision making (see chart for details).     1.  Musculoskeletal chest pain: EKG shows normal sinus rhythm Toradol 30 mg IM x1 with improvement in chest pain Warm compress encouraged Cardiac etiology is unlikely If patient continues to have chest pain she is advised to  return to urgent care to be reevaluated Final Clinical Impressions(s) / UC Diagnoses   Final diagnoses:  Musculoskeletal chest pain   Discharge Instructions   None    ED Prescriptions    Medication Sig Dispense Auth. Provider   ibuprofen (ADVIL) 600 MG tablet Take 1 tablet (600 mg total) by mouth every 6 (six) hours as needed. 30 tablet Peaches Vanoverbeke, Myrene Galas, MD     PDMP not reviewed this encounter.   Chase Picket, MD 12/20/18 1145

## 2019-01-06 ENCOUNTER — Ambulatory Visit: Payer: Self-pay

## 2019-01-06 DIAGNOSIS — I5032 Chronic diastolic (congestive) heart failure: Secondary | ICD-10-CM

## 2019-01-06 DIAGNOSIS — I10 Essential (primary) hypertension: Secondary | ICD-10-CM

## 2019-01-06 NOTE — Chronic Care Management (AMB) (Signed)
Care Management   Initial Visit Note  01/06/2019 Name: Andrea [name] MRN: [id number] DOB: 1955-06-10  Objective: BP Readings from Last 3 Encounters:  12/19/18 (!) 112/97  08/23/18 140/90  10/16/17 130/82     Assessment: Andrea [name] is a 63 y.o. year old female who sees [name], MD for primary care. The care management team was consulted for assistance with care management and care coordination needs related to Disease Management Educational Needs Care Coordination.   Review of patient status, including review of consultants reports, relevant laboratory and other test results, and collaboration with appropriate care team members and the patient's provider was performed as part of comprehensive patient evaluation and provision of care management services.    SDOH (Social Determinants of Health) screening performed today: Financial Strain . See Care Plan for related entries.   Advanced Directives: See Care Plan and [employer]yn[employer] application for related entries.   Outpatient Encounter Medications as of 01/06/2019  Medication Sig Note  . aspirin (ASPIRIN CHILDRENS) 81 MG chewable tablet Chew 81 mg by mouth daily.     . Blood Pressure Monitoring (BLOOD PRESSURE MONITOR/L CUFF) MISC Check blood pressure 3 times per week.   . carvedilol (COREG) 6.25 MG tablet TAKE 1 TABLET BY MOUTH TWICE DAILY WITH A MEAL 01/06/2019: Taking differently - taking 2 tablets once daily  . ibuprofen (ADVIL) 600 MG tablet Take 1 tablet (600 mg total) by mouth every 6 (six) hours as needed.   [name] levothyroxine (SYNTHROID, LEVOTHROID) 137 MCG tablet TAKE 1 TABLET BY MOUTH ONCE DAILY BEFORE BREAKFAST   . loratadine (CLARITIN) 10 MG tablet TAKE 1 TABLET BY MOUTH ONCE DAILY   . sertraline (ZOLOFT) 50 MG tablet Take 3 tablets by mouth once daily   . simvastatin (ZOCOR) 40 MG tablet TAKE 1 TABLET BY MOUTH AT BEDTIME   . triamterene-hydrochlorothiazide (MAXZIDE-25) 37.5-25 MG tablet Take 1 tablet by mouth  once daily   . famotidine (PEPCID) 20 MG tablet Take 1 tablet (20 mg total) by mouth daily. (Patient not taking: Reported on 01/06/2019) 01/06/2019: Calcium CArbonate  . Vitamin D, Ergocalciferol, (DRISDOL) 1.25 MG (50000 UT) CAPS capsule Take 1 capsule (50,000 Units total) by mouth every 7 (seven) days. (Patient not taking: Reported on 01/06/2019) 01/06/2019: Taking OTC 50mg  2 tabs qd   No facility-administered encounter medications on file as of 01/06/2019.     Goals Addressed            This Visit's Progress   . "I don't understand why I fell" (pt-stated)       Current Barriers:  [name] Knowledge Deficits related to fall prevention-patient reports falling while getting out of the bath tub on 10/22.  Denies loss of consciousness or feeling dizzy.  States she doesn't know what happened. Reported to the ED and treated for musculoskeletal chest pain.  Nurse Case Manager Clinical Goal(s):  [name] Over the next 30 days, patient will meet with RN Care Manager to address need for strategies for fall prevention  Interventions:  . Evaluation of current treatment plan related to fall prevetion and patient's adherence to plan as established by provider. Patient evaluated in the ED and released to home. . Discussed plans with patient for ongoing care management follow up and provided patient with direct contact information for care management team  Patient Self Care Activities:  . Self administers medications as prescribed . Performs ADL's independently . Performs IADL's independently . Does not have fall prevention strategies  Initial goal documentation     . "  I nedd help finding insurance that I can sign up for." (pt-stated)       Current Barriers:  [name] Knowledge Deficits related to navigation of health insurance portal.  Nurse Case Manager Clinical Goal(s):  [name] Over the next 14 days, patient will work with Clinical Social Worker to address needs related to navigation of health insurance portal.   Interventions:  . Social Work referral for Navigation of health portal needs.  Patient Self Care Activities:  . Self administers medications as prescribed . Attends all scheduled provider appointments . Calls pharmacy for medication refills . Performs ADL's independently . Performs IADL's independently . Calls provider office for new concerns or questions . Unable to independently to navigate health insurance portal.  Initial goal documentation     . 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 . Non-adherence to prescribed medication regimen Taking all medications correct except for Coreg, which she is taking both doses at the same time, because she can't remember to take it bid.  Nurse Case Manager Clinical Goal(s):  [name] Over the next 30 days, patient will verbalize understanding of plan for work with care mangement team to develop long term plans for a chronic diease management . Over the next 14 days, patient will demonstrate improved adherence to prescribed treatment plan for HTN/CHF as evidenced byadherence to medication regime,self monitoring of BP  Interventions:  . Evaluation of current treatment plan related to HTN/CHF and patient's adherence to plan as established by provider. . Advised patient to check BP and record daily. . Provided education to patient re: medication adherance  . Reviewed medications with patient and discussed taking Coreg bid as prescribed . Discussed plans with patient for ongoing care management follow up and provided patient with direct contact information for care management team  Patient Self Care Activities:  . Self administers medications as prescribed . Attends all scheduled provider appointments . Calls pharmacy for medication refills . Performs ADL's independently . Performs IADL's  independently . Calls provider office for new concerns or questions . Does not adhere to prescribed medication regimen  Initial goal documentation         Follow up plan:  The care management team will reach out to the patient again over the next 14 days.   Ms. Credit was given information about Care Management services today including:  1. Care Management services include personalized support from designated clinical staff supervised by a physician, including individualized plan of care and coordination with other care providers 2. 24/7 contact phone numbers for assistance for urgent and routine care needs. 3. The patient may stop Care Management services at any time (effective at the end of the month) by phone call to the office staff.  Patient agreed to services and verbal consent obtained.  Lazaro Arms RN, BSN, [facility] Care Management Coordinator [place] Phone: [phone]  Office: [phone] Fax: [phone]

## 2019-01-06 NOTE — Patient Instructions (Signed)
Visit Information  Goals Addressed            This Visit's Progress   . "I don't understand why I fell" (pt-stated)       Current Barriers:  Andrea [name] Knowledge Deficits related to fall prevention-patient reports falling while getting out of the bath tub on 10/22.  Denies loss of consciousness or feeling dizzy.  States she doesn't know what happened. Reported to the ED and treated for musculoskeletal chest pain.  Nurse Case Manager Clinical Goal(s):  Andrea [name] Over the next 30 days, patient will meet with RN Care Manager to address need for strategies for fall prevention  Interventions:  . Evaluation of current treatment plan related to fall prevetion and patient's adherence to plan as established by provider. Patient evaluated in the ED and released to home. . Discussed plans with patient for ongoing care management follow up and provided patient with direct contact information for care management team  Patient Self Care Activities:  . Self administers medications as prescribed . Performs ADL's independently . Performs IADL's independently . Does not have fall prevention strategies  Initial goal documentation     . "I nedd help finding insurance that I can sign up for." (pt-stated)       Current Barriers:  Andrea [name] Knowledge Deficits related to navigation of health insurance portal.  Nurse Case Manager Clinical Goal(s):  Andrea [name] Over the next 14 days, patient will work with Clinical Social Worker to address needs related to navigation of health insurance portal.  Interventions:  . Social Work referral for Navigation of health portal needs.  Patient Self Care Activities:  . Self administers medications as prescribed . Attends all scheduled provider appointments . Calls pharmacy for medication refills . Performs ADL's independently . Performs IADL's independently . Calls provider office for new concerns or questions . Unable to independently to navigate health insurance portal.  Initial goal  documentation     . "I will not remember taking my medication twice daily for  my heart" (pt-stated)       Current Barriers:  Andrea [name] Knowledge Deficits related to long term management of  cardiovascular diease (HTN/CHF). . Non-adherence to prescribed medication regimen Taking all medications correct except for Coreg, which she is taking both doses at the same time, because she can't remember to take it bid.  Nurse Case Manager Clinical Goal(s):  Andrea [name] Over the next 30 days, patient will verbalize understanding of plan for work with care mangement team to develop long term plans for a chronic diease management . Over the next 14 days, patient will demonstrate improved adherence to prescribed treatment plan for HTN/CHF as evidenced byadherence to medication regime,self monitoring of BP  Interventions:  . Evaluation of current treatment plan related to HTN/CHF and patient's adherence to plan as established by provider. . Advised patient to check BP and record daily. . Provided education to patient re: medication adherance  . Reviewed medications with patient and discussed taking Coreg bid as prescribed . Discussed plans with patient for ongoing care management follow up and provided patient with direct contact information for care management team  Patient Self Care Activities:  . Self administers medications as prescribed . Attends all scheduled provider appointments . Calls pharmacy for medication refills . Performs ADL's independently . Performs IADL's independently . Calls provider office for new concerns or questions . Does not adhere to prescribed medication regimen  Initial goal documentation        Ms. Neth was given information about Care  Management services today including:  1. Care Management services include personalized support from designated clinical staff supervised by her physician, including individualized plan of care and coordination with other care providers 2. 24/7  contact phone numbers for assistance for urgent and routine care needs. 3. The patient may stop CCM services at any time (effective at the end of the month) by phone call to the office staff.  Patient agreed to services and verbal consent obtained.   The patient verbalized understanding of instructions provided today and declined a print copy of patient instruction materials.   The care management team will reach out to the patient again over the next 14 days.   Lazaro Arms RN, BSN, [facility] Care Management Coordinator [place] Phone: [phone]  Office: [phone] Fax: [phone]

## 2019-01-10 ENCOUNTER — Ambulatory Visit: Payer: Self-pay | Admitting: Licensed Clinical Social Worker

## 2019-01-10 NOTE — Chronic Care Management (AMB) (Addendum)
Care Management   Clinical Social Work General Note  01/10/2019 Name: Andrea [name] MRN: [id number] DOB: 08/05/1955  Andrea [name] is a 63 y.o. year old female who is a primary care patient of [name], MD.  The Care Management team was consulted to assist patient with obtaining insurance.  LCSW received return call from patient assessed needed related to the above referral. Patient states she will obtain insurance via the [employer] and has located an insurance that she can afford.  Benefits will start in Jan.  Assessed for Social Determinants of Health, no needs identified.  [name] was given information about Care Management services today including:  1. Care Management services include personalized support from designated clinical staff supervised by her physician, including individualized plan of care and coordination with other care providers 2. 24/7 contact phone numbers for assistance for urgent and routine care needs. 3. The patient may stop Care Management services at any time (effective at the end of the month) by phone call to the office staff. Patient agreed to services and verbal consent obtained.   Review of patient status, including review of consultants reports, relevant laboratory and other test results, and collaboration with appropriate care team members and the patient's provider was performed as part of comprehensive patient evaluation and provision of chronic care management services.    Outpatient Encounter Medications as of 01/10/2019  Medication Sig Note  . aspirin (ASPIRIN CHILDRENS) 81 MG chewable tablet Chew 81 mg by mouth daily.     . Blood Pressure Monitoring (BLOOD PRESSURE MONITOR/L CUFF) MISC Check blood pressure 3 times per week.   . carvedilol (COREG) 6.25 MG tablet TAKE 1 TABLET BY MOUTH TWICE DAILY WITH A MEAL 01/06/2019: Taking differently - taking 2 tablets once daily  . famotidine (PEPCID) 20 MG tablet Take 1 tablet (20 mg  total) by mouth daily. (Patient not taking: Reported on 01/06/2019) 01/06/2019: Calcium CArbonate  . ibuprofen (ADVIL) 600 MG tablet Take 1 tablet (600 mg total) by mouth every 6 (six) hours as needed.   Andrea [name] levothyroxine (SYNTHROID, LEVOTHROID) 137 MCG tablet TAKE 1 TABLET BY MOUTH ONCE DAILY BEFORE BREAKFAST   . loratadine (CLARITIN) 10 MG tablet TAKE 1 TABLET BY MOUTH ONCE DAILY   . sertraline (ZOLOFT) 50 MG tablet Take 3 tablets by mouth once daily   . simvastatin (ZOCOR) 40 MG tablet TAKE 1 TABLET BY MOUTH AT BEDTIME   . triamterene-hydrochlorothiazide (MAXZIDE-25) 37.5-25 MG tablet Take 1 tablet by mouth once daily   . Vitamin D, Ergocalciferol, (DRISDOL) 1.25 MG (50000 UT) CAPS capsule Take 1 capsule (50,000 Units total) by mouth every 7 (seven) days. (Patient not taking: Reported on 01/06/2019) 01/06/2019: Taking OTC 50mg  2 tabs qd   No facility-administered encounter medications on file as of 01/10/2019.    Goals Addressed            This Visit's Progress   . COMPLETED: "I nedd help finding insurance that I can sign up for." (pt-stated)       Current Barriers:  Andrea [name] Knowledge Deficits related to navigation of health insurance portal.  Nurse Case Manager Clinical Goal(s):  Andrea [name] Over the next 14 days, patient will work with Clinical Social Worker to address needs related to navigation of health insurance portal.  Interventions:  . Social Work referral for Navigation of health portal needs.  Patient Self Care Activities:  . Self administers medications as prescribed . Attends all scheduled provider appointments . Calls pharmacy for medication  refills . Performs ADL's independently . Performs IADL's independently . Calls provider office for new concerns or questions . Unable to independently to navigate health insurance portal.  Initial goal documentation        Follow Up Plan: Patient will continue to work with RN Care manager.  LSCW available if needed  Andrea [name],  [place] / [place]   [phone] 4:38 PM    .

## 2019-01-10 NOTE — Chronic Care Management (AMB) (Signed)
  Social Work Care Management  Outreach Note  01/10/2019 Name: Andrea [name] MRN: [id number] DOB: [birth year]  Referred by: [name], MD Reason for referral : Care Coordination (insurance options)  An unsuccessful telephone outreach was attempted today. The patient was referred to the case management team by for assistance with care management and care coordination.   Follow Up Plan: 1. A HIPPA compliant phone message was left for the patient providing contact information and requesting a return call.  2. If no return call is received LCSW will call again in 3 days.  Casimer Lanius, LCSW Clinical Social Worker [place] / [place]   [phone] 9:28 AM

## 2019-01-15 ENCOUNTER — Telehealth: Payer: Self-pay

## 2019-01-20 ENCOUNTER — Telehealth: Payer: Self-pay

## 2019-01-20 ENCOUNTER — Ambulatory Visit: Payer: Self-pay

## 2019-01-20 ENCOUNTER — Other Ambulatory Visit: Payer: Self-pay

## 2019-01-20 NOTE — Chronic Care Management (AMB) (Signed)
  Care Management     Care Management Outreach Note  01/20/2019 Name: Andrea [name] MRN: [id number] DOB: 05-Aug-1955  Andrea [name] is a 63 y.o. year old female who is a primary care patient of [name], MD . The Care Management team was consulted for assistance with . Disease Management Educational Needs  RN Care Manager reached out to [place] today by phone patient states that she is not at home.  She asked if I could give her a call back at around 3 pm.   Follow Up Plan: The care management team will reach out to the patient this afternoon at 3 pm.  Lazaro Arms RN, BSN, [place] Phone: [phone] I Office: [phone] Fax: [phone]

## 2019-01-21 NOTE — Chronic Care Management (AMB) (Signed)
Care Management   Follow Up Note   01/21/2019 Name: Andrea [name] MRN: [id number] DOB: Nov 03, 1955  Referred by: [name], MD Reason for referral : Care Coordination (Care management Education F/U)   Andrea [name] is a 63 y.o. year old female who is a primary care patient of [name], MD. The care management team was consulted for assistance with care management and care coordination needs.    Review of patient status, including review of consultants reports, relevant laboratory and other test results, and collaboration with appropriate care team members and the patient's provider was performed as part of comprehensive patient evaluation and provision of chronic care management services.    SDOH (Social Determinants of Health) screening performed today: None. See Care Plan for related entries.   Advanced Directives: See Care Plan and Vynca application for related entries.   Goals Addressed            This Visit's Progress   . "I don't understand why I fell" (pt-stated)       Current Barriers:  [name] Knowledge Deficits related to fall prevention-patient reports falling while getting out of the bath tub on 10/22.  Denies loss of consciousness or feeling dizzy.  States she doesn't know what happened. Reported to the ED and treated for musculoskeletal chest pain.  Nurse Case Manager Clinical Goal(s):  [name] Over the next 30 days, patient will meet with RN Care Manager to address need for strategies for fall prevention  Interventions:  . Evaluation of current treatment plan related to fall prevetion and patient's adherence to plan as established by provider. Patient evaluated in the ED and released to home. . Discussed plans with patient for ongoing care management follow up and provided patient with direct contact information for care management team.  Patient states she has not had any falls or any new symptoms  Patient Self Care Activities:  . Self administers  medications as prescribed . Performs ADL's independently . Performs IADL's independently . Does not have fall prevention strategies  Initial goal documentation     . 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 . Non-adherence to prescribed medication regimen Patient is taking her medication 1 in the am and 1 at when she come home from work it helps her some. Nurse Case Manager Clinical Goal(s):  [name] Over the next 30 days, patient will verbalize understanding of plan for work with care mangement team to develop long term plans for a chronic diease management . Over the next 14 days, patient will demonstrate improved adherence to prescribed treatment plan for HTN/CHF as evidenced byadherence to medication regime,self monitoring of BP  Interventions:  . Evaluation of current treatment plan related to HTN/CHF and patient's adherence to plan as established by provider. . Advised patient to check BP and record daily. Patient states that she will check her bp 1 to 2 times a week and record the  values. Encouraged the patient to drink water, monitor salt intake . Provided education to patient re: medication adherance  . Reviewed medications with patient and discussed taking Coreg bid as prescribed encouraged her to continue to take her medication bid . Discussed plans with patient for ongoing care management follow up and provided patient with direct contact information for care management team  Patient Self Care Activities:  .  Self administers medications as prescribed . Attends all scheduled provider appointments . Calls pharmacy for medication refills . Performs ADL's independently . Performs IADL's independently . Calls provider office for new concerns or questions . Does not adhere to prescribed medication regimen  Please see past updates  related to this goal by clicking on the "Past Updates" button in the selected goal          The care management team will reach out to the patient again over the next 14 days.  The patient has been provided with contact information for the care management team and has been advised to call with any health related questions or concerns.   Lazaro Arms RN, BSN, [facility] Care Management Coordinator [place] Phone: [phone] I Office: [phone] Fax: [phone]

## 2019-01-21 NOTE — Patient Instructions (Signed)
Visit Information  Goals Addressed            This Visit's Progress   . "I don't understand why I fell" (pt-stated)       Current Barriers:  [name] Knowledge Deficits related to fall prevention-patient reports falling while getting out of the bath tub on 10/22.  Denies loss of consciousness or feeling dizzy.  States she doesn't know what happened. Reported to the ED and treated for musculoskeletal chest pain.  Nurse Case Manager Clinical Goal(s):  [name] Over the next 30 days, patient will meet with RN Care Manager to address need for strategies for fall prevention  Interventions:  . Evaluation of current treatment plan related to fall prevetion and patient's adherence to plan as established by provider. Patient evaluated in the ED and released to home. . Discussed plans with patient for ongoing care management follow up and provided patient with direct contact information for care management team.  Patient states she has not had any falls or any new symptoms  Patient Self Care Activities:  . Self administers medications as prescribed . Performs ADL's independently . Performs IADL's independently . Does not have fall prevention strategies  Initial goal documentation     . 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 . Non-adherence to prescribed medication regimen Patient is taking her medication 1 in the am and 1 at when she come home from work it helps her some. Nurse Case Manager Clinical Goal(s):  [name] Over the next 30 days, patient will verbalize understanding of plan for work with care mangement team to develop long term plans for a chronic diease management . Over the next 14 days, patient will demonstrate improved adherence to prescribed treatment plan for HTN/CHF as evidenced byadherence to medication regime,self monitoring of BP   Interventions:  . Evaluation of current treatment plan related to HTN/CHF and patient's adherence to plan as established by provider. . Advised patient to check BP and record daily. Patient states that she will check her bp 1 to 2 times a week and record the  values. Encouraged the patient to drink water, monitor salt intake . Provided education to patient re: medication adherance  . Reviewed medications with patient and discussed taking Coreg bid as prescribed encouraged her to continue to take her medication bid . Discussed plans with patient for ongoing care management follow up and provided patient with direct contact information for care management team  Patient Self Care Activities:  . Self administers medications as prescribed . Attends all scheduled provider appointments . Calls pharmacy for medication refills . Performs ADL's independently . Performs IADL's independently . Calls provider office for new concerns or questions . Does not adhere to prescribed medication regimen  Please see past updates related to this goal by clicking on the "Past Updates" button in the selected goal         Andrea [name] was given information about Care Management services today including:  1. Care Management services include personalized support from designated clinical staff supervised by her physician, including individualized plan of care and coordination with other care providers 2. 24/7 contact phone numbers for assistance for urgent and routine care needs. 3. The patient may stop CCM services at any time (effective at the end of the month) by phone call to the office staff.  Patient  agreed to services and verbal consent obtained.   The patient verbalized understanding of instructions provided today and declined a print copy of patient instruction materials.   The care management team will reach out to the patient again over the next 14 days.  The patient has been provided with contact  information for the care management team and has been advised to call with any health related questions or concerns.   Lazaro Arms RN, BSN, [facility] Care Management Coordinator [place] Phone: [phone] I Office: [phone] Fax: [phone]    Blood Pressure Record Sheet To take your blood pressure, you will need a blood pressure machine. You can buy a blood pressure machine (blood pressure monitor) at your clinic, drug store, or online. When choosing one, consider:  An automatic monitor that has an arm cuff.  A cuff that wraps snugly around your upper arm. You should be able to fit only one finger between your arm and the cuff.  A device that stores blood pressure reading results.  Do not choose a monitor that measures your blood pressure from your wrist or finger. Follow your health care provider's instructions for how to take your blood pressure. To use this form:  Get one reading in the morning (a.m.) before you take any medicines.  Get one reading in the evening (p.m.) before supper.  Take at least 2 readings with each blood pressure check. This makes sure the results are correct. Wait 1-2 minutes between measurements.  Write down the results in the spaces on this form.  Repeat this once a week, or as told by your health care provider.  Make a follow-up appointment with your health care provider to discuss the results. Blood pressure log Date: _______________________  a.m. _____________________(1st reading) _____________________(2nd reading)  p.m. _____________________(1st reading) _____________________(2nd reading) Date: _______________________  a.m. _____________________(1st reading) _____________________(2nd reading)  p.m. _____________________(1st reading) _____________________(2nd reading) Date: _______________________  a.m. _____________________(1st reading) _____________________(2nd reading)  p.m. _____________________(1st  reading) _____________________(2nd reading) Date: _______________________  a.m. _____________________(1st reading) _____________________(2nd reading)  p.m. _____________________(1st reading) _____________________(2nd reading) Date: _______________________  a.m. _____________________(1st reading) _____________________(2nd reading)  p.m. _____________________(1st reading) _____________________(2nd reading) This information is not intended to replace advice given to you by your health care provider. Make sure you discuss any questions you have with your health care provider. Document Released: 11/12/2002 Document Revised: 04/13/2017 Document Reviewed: 02/13/2017 Elsevier Patient Education  2020 [employer].

## 2019-02-04 ENCOUNTER — Ambulatory Visit: Payer: Self-pay

## 2019-02-04 NOTE — Chronic Care Management (AMB) (Signed)
  Care Management   Outreach Note  02/04/2019 Name: Andrea [name] MRN: [id number] DOB: [birth year]  Referred by: [name], MD Reason for referral : Care Coordination (Care Management HTN CHF)   An unsuccessful telephone outreach was attempted today. The patient was referred to the case management team by for assistance with care management and care coordination.   Follow Up Plan: The care management team will reach out to the patient again over the next 7 days.   Lazaro Arms RN, BSN, [facility] Care Management Coordinator [place] Phone: [phone] I Office: [phone] Fax: [phone]

## 2019-02-13 ENCOUNTER — Ambulatory Visit: Payer: Self-pay

## 2019-02-13 ENCOUNTER — Other Ambulatory Visit: Payer: Self-pay

## 2019-02-13 NOTE — Chronic Care Management (AMB) (Signed)
Care Management   Follow Up Note   02/13/2019 Name: Andrea [name] MRN: [id number] DOB: 31-Aug-1955  Referred by: [name], MD Reason for referral : Care Coordination (Care Management F/U CCM RNCM HTN CHF)   Andrea [name] is a 63 y.o. year old female who is a primary care patient of [name], MD. The care management team was consulted for assistance with care management and care coordination needs for HTN/ CHF.    Review of patient status, including review of consultants reports, relevant laboratory and other test results, and collaboration with appropriate care team members and the patient's provider was performed as part of comprehensive patient evaluation and provision of chronic care management services.    SDOH (Social Determinants of Health) screening performed today: None. See Care Plan for related entries.   Advanced Directives: See Care Plan and Vynca application for related entries.   . Goals Addressed            This Visit's Progress   . COMPLETED: "I don't understand why I fell" (pt-stated)       Current Barriers:  [name] Knowledge Deficits related to fall prevention-patient reports falling while getting out of the bath tub on 10/22.  Denies loss of consciousness or feeling dizzy.  States she doesn't know what happened. Reported to the ED and treated for musculoskeletal chest pain.  Nurse Case Manager Clinical Goal(s):  [name] Over the next 90 days, patient will verbalize understanding of plan for fall prevetion  Interventions:  . Evaluation of current treatment plan related to fall prevetion and patient's adherence to plan as established by provider. Patient evaluated in the ED and released to home. . Discussed plans with patient for ongoing care management follow up and provided patient with direct contact information for care management team.  . Patient has not has any falls recently . Patient will call the office with any question or  concerns . Patient has been educated with fall precautions  Patient Self Care Activities:  . Self administers medications as prescribed . Performs ADL's independently . Performs IADL's independently . Calls provider office for new concerns or questions  Please see past updates related to this goal by clicking on the "Past Updates" button in the selected goal      . 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 . Non-adherence to prescribed medication regimen Patient was taking her medication at one time  Nurse Case Manager Clinical Goal(s):  [name] Over the next 90 days, patient will verbalize understanding of plan for HTN/CHF . Over the next 90 days, patient will work with [facility] to address needs related to monitoring BP and CHF   Interventions:  . Evaluation of current treatment plan related to HTN/CHF and patient's adherence to plan as established by provider. . Advised patient to check BP/Weight and record daily. Patient states that she will check her bp 1 to 2 times a week and record the  values. Encouraged the patient to drink water, monitor salt intake . Patient states that she is not checking her bp she forgets.  She states that she is going to put up a note to remind herself to check her blood pressure and track trends . [name] Explained to her the importance of checking her pressure and weighing recording the values.  Being  able to track trends . Patient is taking her medication 1 in am and 1 in pm . Reviewed medications with patient  . encouraged her to continue to take her medication bid . Discussed low sodium diet . Talked with the patient about symptoms of CHF . Discussed with the patient about weighing /she does not weigh daily . Patient will make an appointment at the beginning of the year to discuss episode of shob and  being tired with Dr [name]. . Patient sent educational material : A Matter of choice Blood Pressure Control, Living better with Heart Failure  Patient Self Care Activities:  . Self administers medications as prescribed . Attends all scheduled provider appointments . Calls pharmacy for medication refills . Performs ADL's independently . Performs IADL's independently . Calls provider office for new concerns or questions . Does not adhere to prescribed medication regimen  Please see past updates related to this goal by clicking on the "Past Updates" button in the selected goal              The care management team will reach out to the patient again over the next 21 days.  The patient has been provided with contact information for the care management team and has been advised to call with any health related questions or concerns.   Lazaro Arms RN, BSN, [facility] Care Management Coordinator [place] Phone: [phone] Office: [phone] Fax: [phone]

## 2019-02-14 NOTE — Patient Instructions (Addendum)
Visit Information  Goals Addressed            This Visit's Progress   . COMPLETED: "I don't understand why I fell" (pt-stated)       Current Barriers:  [name] Knowledge Deficits related to fall prevention-patient reports falling while getting out of the bath tub on 10/22.  Denies loss of consciousness or feeling dizzy.  States she doesn't know what happened. Reported to the ED and treated for musculoskeletal chest pain.  Nurse Case Manager Clinical Goal(s):  [name] Over the next 90 days, patient will verbalize understanding of plan for fall prevetion  Interventions:  . Evaluation of current treatment plan related to fall prevetion and patient's adherence to plan as established by provider. Patient evaluated in the ED and released to home. . Discussed plans with patient for ongoing care management follow up and provided patient with direct contact information for care management team.  . Patient has not has any falls recently . Patient will call the office with any question or concerns . Patient has been educated with fall precautions  Patient Self Care Activities:  . Self administers medications as prescribed . Performs ADL's independently . Performs IADL's independently . Calls provider office for new concerns or questions  Please see past updates related to this goal by clicking on the "Past Updates" button in the selected goal      . 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 . Non-adherence to prescribed medication regimen Patient was taking her medication at one time  Nurse Case Manager Clinical Goal(s):  [name] Over the next 90 days, patient will verbalize understanding of plan for HTN/CHF . Over the next 90 days, patient will work with [facility] to address needs related to monitoring BP and CHF   Interventions:  . Evaluation  of current treatment plan related to HTN/CHF and patient's adherence to plan as established by provider. . Advised patient to check BP/Weight and record daily. Patient states that she will check her bp 1 to 2 times a week and record the  values. Encouraged the patient to drink water, monitor salt intake . Patient states that she is not checking her bp she forgets.  She states that she is going to put up a note to remind herself to check her blood pressure and track trends . [name] Explained to her the importance of checking her pressure and weighing recording the values.  Being able to track trends . Patient is taking her medication 1 in am and 1 in pm . Reviewed medications with patient  . encouraged her to continue to take her medication bid . Discussed low sodium diet . Talked with the patient about symptoms of CHF . Discussed with the patient about weighing /she does not weigh daily . Patient will make an appointment at the beginning of the year to discuss episode of shob and being tired with Dr [name]. . Patient sent educational material : A Matter of choice Blood Pressure Control, Living better with Heart Failure  Patient Self Care Activities:  . Self administers medications as prescribed . Attends all scheduled provider appointments . Calls pharmacy for medication refills . Performs ADL's independently . Performs IADL's independently . Calls provider office for new concerns or questions . Does not adhere to prescribed medication regimen  Please see past updates  related to this goal by clicking on the "Past Updates" button in the selected goal         Ms. Cure was given information about Care Management services today including:  1. Care Management services include personalized support from designated clinical staff supervised by her physician, including individualized plan of care and coordination with other care providers 2. 24/7 contact phone numbers for assistance for urgent and  routine care needs. 3. The patient may stop CCM services at any time (effective at the end of the month) by phone call to the office staff.  Patient agreed to services and verbal consent obtained.   The patient verbalized understanding of instructions provided today and declined a print copy of patient instruction materials.   The care management team will reach out to the patient again over the next 21 days.  The patient has been provided with contact information for the care management team and has been advised to call with any health related questions or concerns.   Lazaro Arms RN, BSN, [facility] Care Management Coordinator [place] Phone: [phone] Office: [phone] Fax: [phone]

## 2019-03-01 ENCOUNTER — Other Ambulatory Visit: Payer: Self-pay | Admitting: Family Medicine

## 2019-03-06 ENCOUNTER — Other Ambulatory Visit: Payer: Self-pay

## 2019-03-06 ENCOUNTER — Ambulatory Visit: Payer: Self-pay

## 2019-03-06 NOTE — Chronic Care Management (AMB) (Signed)
Care Management   Follow Up Note   03/06/2019 Name: Andrea [name] MRN: [id number] DOB: 08-Dec-1955  Referred by: [name], MD Reason for referral : Care Coordination (Care Management  RNCM HTN/ CHF)   Andrea [name] is a 64 y.o. year old female who is a primary care patient of [name], MD. The care management team was consulted for assistance with care management and care coordination needs.    Review of patient status, including review of consultants reports, relevant laboratory and other test results, and collaboration with appropriate care team members and the patient's provider was performed as part of comprehensive patient evaluation and provision of chronic care management services.    SDOH (Social Determinants of Health) screening performed today: None. See Care Plan for related entries.   Advanced Directives: See Care Plan and Vynca application for related entries.   Goals Addressed            This Visit's Progress   . 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 . Non-adherence to prescribed medication regimen Patient was taking her medication at one time  Nurse Case Manager Clinical Goal(s):  [name] Over the next 90 days, patient will verbalize understanding of plan for HTN/CHF . Over the next 90 days, patient will work with [facility] to address needs related to monitoring BP and CHF   Interventions:  . Evaluation of current treatment plan related to HTN/CHF and patient's adherence to plan as established by provider. . Advised patient to check BP/Weight and record daily. Patient states that she will check her bp 1 to 2 times a week and record the  values. Encouraged the patient to drink water, monitor salt intake . Patient states that she is not checking her bp she forgets.  She states that  she is going to put up a note to remind herself to check her blood pressure and track trends . [name] Explained to her the importance of checking her pressure and weighing recording the values.  Being able to track trends . Patient is taking her medication 1 in am and 1 in pm . Reviewed medications with patient  . encouraged her to continue to take her medication bid . Discussed low sodium diet . Talked with the patient about symptoms of CHF . Discussed with the patient about weighing /she does not weigh daily . Patient will make an appointment at the beginning of the year to discuss episode of shob and being tired with Dr [name]. . Patient sent educational material : A Matter of choice Blood Pressure Control, Living better with Heart Failure   03/06/19 . Patient states that she received the educational books but has not had time to read them yet.  Encouraged her to look them over. . Patient states that she had to but a new BP cuff.  She now has   wrist cuff.  Her reading from yesterday was 143/122 p 85.  Advised her the bottom number was high.  Advise her to 1. When checking her BP have cuff at heart level. 2. Rest for 5 minutes before checking.  3. Cut down on her caffeinated drinks. . Drink more water . Her weight is 271 lbs.  Advised her to continue to weight daily and record her weight. . Monitor her sodium intake.  She  states she is eating a lot of chip.  Explained to her how the salt affects her body with the retention of water weight and affecting her blood pressure. . Reviewed medications.  She is taking her meds as prescribed.   Patient Self Care Activities:  . Self administers medications as prescribed . Attends all scheduled provider appointments . Calls pharmacy for medication refills . Performs ADL's independently . Performs IADL's independently . Calls provider office for new concerns or questions . Does not adhere to prescribed medication regimen  Please see past updates  related to this goal by clicking on the "Past Updates" button in the selected goal          The care management team will reach out to the patient again over the next 14 days.  The patient has been provided with contact information for the care management team and has been advised to call with any health related questions or concerns.   Lazaro Arms RN, BSN, [facility] Care Management Coordinator [place] Phone: [phone] Fax: [phone]

## 2019-03-06 NOTE — Patient Instructions (Signed)
Visit Information  Goals Addressed            This Visit's Progress   . 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 . Non-adherence to prescribed medication regimen Patient was taking her medication at one time  Nurse Case Manager Clinical Goal(s):  [name] Over the next 90 days, patient will verbalize understanding of plan for HTN/CHF . Over the next 90 days, patient will work with [facility] to address needs related to monitoring BP and CHF   Interventions:  . Evaluation of current treatment plan related to HTN/CHF and patient's adherence to plan as established by provider. . Advised patient to check BP/Weight and record daily. Patient states that she will check her bp 1 to 2 times a week and record the  values. Encouraged the patient to drink water, monitor salt intake . Patient states that she is not checking her bp she forgets.  She states that she is going to put up a note to remind herself to check her blood pressure and track trends . [name] Explained to her the importance of checking her pressure and weighing recording the values.  Being able to track trends . Patient is taking her medication 1 in am and 1 in pm . Reviewed medications with patient  . encouraged her to continue to take her medication bid . Discussed low sodium diet . Talked with the patient about symptoms of CHF . Discussed with the patient about weighing /she does not weigh daily . Patient will make an appointment at the beginning of the year to discuss episode of shob and being tired with Dr [name]. . Patient sent educational material : A Matter of choice Blood Pressure Control, Living better with Heart Failure   03/06/19 . Patient states that she received the educational books but has not had time to read them yet.  Encouraged her to look them over. . Patient  states that she had to but a new BP cuff.  She now has   wrist cuff.  Her reading from yesterday was 143/122 p 85.  Advised her the bottom number was high.  Advise her to 1. When checking her BP have cuff at heart level. 2. Rest for 5 minutes before checking.  3. Cut down on her caffeinated drinks. . Drink more water . Her weight is 271 lbs.  Advised her to continue to weight daily and record her weight. . Monitor her sodium intake.  She states she is eating a lot of chip.  Explained to her how the salt affects her body with the retention of water weight and affecting her blood pressure. . Reviewed medications.  She is taking her meds as prescribed.   Patient Self Care Activities:  . Self administers medications as prescribed . Attends all scheduled provider appointments . Calls pharmacy for medication refills . Performs ADL's independently . Performs IADL's independently . Calls provider office for new concerns or questions . Does not adhere to prescribed medication regimen  Please see past updates related to this goal by clicking on the "Past Updates" button in the selected goal         Andrea [name] was given information about Care Management services today including:  1. Care Management services include personalized support from designated clinical staff supervised by her physician, including individualized plan of  care and coordination with other care providers 2. 24/7 contact phone numbers for assistance for urgent and routine care needs. 3. The patient may stop CCM services at any time (effective at the end of the month) by phone call to the office staff.  Patient agreed to services and verbal consent obtained.   The patient verbalized understanding of instructions provided today and declined a print copy of patient instruction materials.   The care management team will reach out to the patient again over the next 14 days.  The patient has been provided with contact information  for the care management team and has been advised to call with any health related questions or concerns.   Lazaro Arms RN, BSN, [facility] Care Management Coordinator [place] Phone: [phone] Fax: [phone]

## 2019-03-21 ENCOUNTER — Ambulatory Visit: Payer: Self-pay

## 2019-03-21 ENCOUNTER — Other Ambulatory Visit: Payer: Self-pay

## 2019-03-21 NOTE — Chronic Care Management (AMB) (Signed)
Care Management   Follow Up Note   03/21/2019 Name: Andrea [name] MRN: [id number] DOB: 02-Mar-1955  Referred by: [name], MD Reason for referral : Care Coordination (Care Management RNCM HTN/ CHF )   Andrea [name] is a 64 y.o. year old female who is a primary care patient of [name], MD. The care management team was consulted for assistance with care management and care coordination needs.    Review of patient status, including review of consultants reports, relevant laboratory and other test results, and collaboration with appropriate care team members and the patient's provider was performed as part of comprehensive patient evaluation and provision of chronic care management services.    SDOH (Social Determinants of Health) screening performed today: None. See Care Plan for related entries.   Advanced Directives: See Care Plan and Vynca application for related entries.   Goals Addressed            This Visit's Progress   . 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 . Non-adherence to prescribed medication regimen Patient was taking her medication at one time  Nurse Case Manager Clinical Goal(s):  [name] Over the next 90 days, patient will verbalize understanding of plan for HTN/CHF . Over the next 90 days, patient will work with [facility] to address needs related to monitoring BP and CHF   Interventions:  . Evaluation of current treatment plan related to HTN/CHF and patient's adherence to plan as established by provider. . Advised patient to check BP/Weight and record daily. Patient states that she will check her bp 1 to 2 times a week and record the  values. Encouraged the patient to drink water, monitor salt intake . Patient states that she is not checking her bp she forgets.  She states that  she is going to put up a note to remind herself to check her blood pressure and track trends . [name] Explained to her the importance of checking her pressure and weighing recording the values.  Being able to track trends . Patient is taking her medication 1 in am and 1 in pm . Reviewed medications with patient  . encouraged her to continue to take her medication bid . Discussed low sodium diet . Talked with the patient about symptoms of CHF . Discussed with the patient about weighing /she does not weigh daily . Patient will make an appointment at the beginning of the year to discuss episode of shob and being tired with Dr [name]. . Patient sent educational material : A Matter of choice Blood Pressure Control, Living better with Heart Failure   03/06/19 . Patient states that she received the educational books but has not had time to read them yet.  Encouraged her to look them over. . Patient states that she had to but a new BP cuff.  She now has   wrist cuff.  Her reading from yesterday was 143/122 p 85.  Advised her the bottom number was high.  Advise her to 1. When checking her BP have cuff at heart level. 2. Rest for 5 minutes before checking.  3. Cut down on her caffeinated drinks. . Drink more water . Her weight is 271 lbs.  Advised her to continue to weight daily and record her weight. . Monitor her sodium intake.  She  states she is eating a lot of chip.  Explained to her how the salt affects her body with the retention of water weight and affecting her blood pressure. . Reviewed medications.  She is taking her meds as prescribed.  . 03/21/19 . Patient states that she has a wrist monitor but has not been checking her BP. [name] She has a scale and has not checked her weight. . She has not reviewed the material that I had mailed to her. . She states that she is taking her medications as prescribed. . She denies any HA shob, CP . Reviewed rationale of why checking your BP and weight is  important.   Patient Self Care Activities:  . Self administers medications as prescribed . Attends all scheduled provider appointments . Calls pharmacy for medication refills . Performs ADL's independently . Performs IADL's independently . Calls provider office for new concerns or questions . Does not adhere to prescribed medication regimen  Please see past updates related to this goal by clicking on the "Past Updates" button in the selected goal          The care management team will reach out to the patient again over the next 14 days.  The patient has been provided with contact information for the care management team and has been advised to call with any health related questions or concerns.   Lazaro Arms RN, BSN, [facility] Care Management Coordinator [place] Phone: [phone] Fax: [phone]

## 2019-03-21 NOTE — Patient Instructions (Signed)
Visit Information  Goals Addressed            This Visit's Progress   . 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 . Non-adherence to prescribed medication regimen Patient was taking her medication at one time  Nurse Case Manager Clinical Goal(s):  [name] Over the next 90 days, patient will verbalize understanding of plan for HTN/CHF . Over the next 90 days, patient will work with [facility] to address needs related to monitoring BP and CHF   Interventions:  . Evaluation of current treatment plan related to HTN/CHF and patient's adherence to plan as established by provider. . Advised patient to check BP/Weight and record daily. Patient states that she will check her bp 1 to 2 times a week and record the  values. Encouraged the patient to drink water, monitor salt intake . Patient states that she is not checking her bp she forgets.  She states that she is going to put up a note to remind herself to check her blood pressure and track trends . [name] Explained to her the importance of checking her pressure and weighing recording the values.  Being able to track trends . Patient is taking her medication 1 in am and 1 in pm . Reviewed medications with patient  . encouraged her to continue to take her medication bid . Discussed low sodium diet . Talked with the patient about symptoms of CHF . Discussed with the patient about weighing /she does not weigh daily . Patient will make an appointment at the beginning of the year to discuss episode of shob and being tired with Dr [name]. . Patient sent educational material : A Matter of choice Blood Pressure Control, Living better with Heart Failure   03/06/19 . Patient states that she received the educational books but has not had time to read them yet.  Encouraged her to look them over. . Patient  states that she had to but a new BP cuff.  She now has   wrist cuff.  Her reading from yesterday was 143/122 p 85.  Advised her the bottom number was high.  Advise her to 1. When checking her BP have cuff at heart level. 2. Rest for 5 minutes before checking.  3. Cut down on her caffeinated drinks. . Drink more water . Her weight is 271 lbs.  Advised her to continue to weight daily and record her weight. . Monitor her sodium intake.  She states she is eating a lot of chip.  Explained to her how the salt affects her body with the retention of water weight and affecting her blood pressure. . Reviewed medications.  She is taking her meds as prescribed.  . 03/21/19 . Patient states that she has a wrist monitor but has not been checking her BP. [name] She has a scale and has not checked her weight. . She has not reviewed the material that I had mailed to her. . She states that she is taking her medications as prescribed. . She denies any HA shob, CP . Reviewed rationale of why checking your BP and weight is important.   Patient Self Care Activities:  . Self administers medications as prescribed . Attends all scheduled provider appointments . Calls pharmacy for medication refills . Performs ADL's independently . Performs IADL's independently . Calls provider  office for new concerns or questions . Does not adhere to prescribed medication regimen  Please see past updates related to this goal by clicking on the "Past Updates" button in the selected goal         Ms. Syler was given information about Care Management services today including:  1. Care Management services include personalized support from designated clinical staff supervised by her physician, including individualized plan of care and coordination with other care providers 2. 24/7 contact phone numbers for assistance for urgent and routine care needs. 3. The patient may stop CCM services at any time (effective at the end of the month)  by phone call to the office staff.  Patient agreed to services and verbal consent obtained.   The patient verbalized understanding of instructions provided today and declined a print copy of patient instruction materials.   The care management team will reach out to the patient again over the next 14 days.   The patient has been provided with contact information for the care management team and has been advised to call with any health related questions or concerns.   Lazaro Arms RN, BSN, [facility] Care Management Coordinator [place] Phone: [phone] Fax: [phone]

## 2019-04-08 ENCOUNTER — Other Ambulatory Visit: Payer: Self-pay

## 2019-04-08 ENCOUNTER — Encounter: Payer: Self-pay | Admitting: Family Medicine

## 2019-04-08 ENCOUNTER — Ambulatory Visit: Payer: 59 | Admitting: Family Medicine

## 2019-04-08 VITALS — BP 132/80 | HR 77 | Wt 278.4 lb

## 2019-04-08 DIAGNOSIS — Z23 Encounter for immunization: Secondary | ICD-10-CM

## 2019-04-08 DIAGNOSIS — F339 Major depressive disorder, recurrent, unspecified: Secondary | ICD-10-CM

## 2019-04-08 DIAGNOSIS — F331 Major depressive disorder, recurrent, moderate: Secondary | ICD-10-CM

## 2019-04-08 DIAGNOSIS — I509 Heart failure, unspecified: Secondary | ICD-10-CM

## 2019-04-08 DIAGNOSIS — I5032 Chronic diastolic (congestive) heart failure: Secondary | ICD-10-CM

## 2019-04-08 DIAGNOSIS — I10 Essential (primary) hypertension: Secondary | ICD-10-CM

## 2019-04-08 DIAGNOSIS — E785 Hyperlipidemia, unspecified: Secondary | ICD-10-CM

## 2019-04-08 DIAGNOSIS — Z1211 Encounter for screening for malignant neoplasm of colon: Secondary | ICD-10-CM

## 2019-04-08 MED ORDER — ALBUTEROL SULFATE HFA 108 (90 BASE) MCG/ACT IN AERS: 2.0000 | INHALATION_SPRAY | Freq: Four times a day (QID) | RESPIRATORY_TRACT | 1 refills | Status: DC | PRN

## 2019-04-08 NOTE — Progress Notes (Signed)
   CHIEF COMPLAINT / HPI:  HTN/HLD: She is here for f/u. She is compliant with Coreg 6.25 mg BID,Maxide 37.5/25mg  for her BP and Zocor 40 mg qd for her HLD. Denies any other concerns. SOB: C/O SOB and wheezing ongoing for a few weeks. She coughs here and there due to irritation. Her SOB is aggravated by her wearing a face mask and with ambulation. Denies PND or orthopnea. No leg edema. No fever. Ear sensation: C/O Bubble sensation in her left ear for a while. No pain, no drainage. No recent injury. Depression:Compliant with her Zoloft 150 mg qd. She is more depressed due to COVID-19. Obese:Has not been able to exercise a lot.  PERTINENT  PMH / PSH: Reviewed.   OBJECTIVE: BP 132/80   Pulse 77   Wt 278 lb 6.4 oz (126.3 kg)   SpO2 98%   BMI 49.32 kg/m    Physical Exam Vitals and nursing note reviewed.  HENT:     Right Ear: Tympanic membrane and ear canal normal.     Left Ear: Tympanic membrane and ear canal normal.  Cardiovascular:     Rate and Rhythm: Normal rate and regular rhythm.     Heart sounds: Normal heart sounds. No murmur.  Pulmonary:     Effort: Pulmonary effort is normal. No respiratory distress.     Breath sounds: Normal breath sounds. No wheezing or rhonchi.  Abdominal:     General: Bowel sounds are normal.     Palpations: Abdomen is soft. There is no mass.     Tenderness: There is no abdominal tenderness.     Comments: Obese  Musculoskeletal:     Right lower leg: No edema.     Left lower leg: No edema.  Neurological:     Mental Status: She is alert.  Psychiatric:        Mood and Affect: Mood normal.        Behavior: Behavior normal.        Thought Content: Thought content normal.        Judgment: Judgment normal.      Office Visit from 04/08/2019 in [place]  PHQ-9 Total Score  18        ASSESSMENT / PLAN:  HYPERTENSION, BENIGN SYSTEMIC BP looks good. Monitor on current regimen.  Hyperlipidemia Stable. Recheck FLP in  June.  Diastolic CHF ([place]) May be contributing to her SOB. However, she has associated wheezing which will be atypical of CHF. ?? Allergy vs COPD component. Unfortunately, we are unable to do PFT now. Obtain chest xray. Repeat ECHO. Albuterol prn SOB. F/U or go to the ED if symptoms worsens.  Major depressive disorder, recurrent episode ([place]) Compliant with meds, but worsened with COVID-19 anxiety. I discussed counseling and Psychotherapy in addition to her current regimen. She will try that first prior to meds adjustment. I referred her to [facility] for mental health resources and also connected with Dr. Hartford Poli to initiate brief intervention pending referral to outside resources. This patient agreed with the plan. She is safe to self and to others.  Morbid obesity ([place]) Diet and exercise counseling done. I discussed referral to a dietician. I gave her card to call Dr. Jenne Campus for counseling and also placed a referral.    Ear exam normal. ?? Allergy symptoms. Continue Allergy meds. Monitor for now. Andrea Mews, MD [place]

## 2019-04-08 NOTE — Assessment & Plan Note (Signed)
Stable. Recheck FLP in June.

## 2019-04-08 NOTE — Assessment & Plan Note (Signed)
Diet and exercise counseling done. I discussed referral to a dietician. I gave her card to call Dr. Jenne Campus for counseling and also placed a referral.

## 2019-04-08 NOTE — Patient Instructions (Signed)

## 2019-04-08 NOTE — Assessment & Plan Note (Signed)
Compliant with meds, but worsened with COVID-19 anxiety. I discussed counseling and Psychotherapy in addition to her current regimen. She will try that first prior to meds adjustment. I referred her to [facility] for mental health resources and also connected with Dr. Hartford Poli to initiate brief intervention pending referral to outside resources. This patient agreed with the plan. She is safe to self and to others.

## 2019-04-08 NOTE — Assessment & Plan Note (Signed)
BP looks good. Monitor on current regimen.

## 2019-04-08 NOTE — Assessment & Plan Note (Signed)
May be contributing to her SOB. However, she has associated wheezing which will be atypical of CHF. ?? Allergy vs COPD component. Unfortunately, we are unable to do PFT now. Obtain chest xray. Repeat ECHO. Albuterol prn SOB. F/U or go to the ED if symptoms worsens.

## 2019-04-09 ENCOUNTER — Telehealth: Payer: Self-pay | Admitting: Psychology

## 2019-04-09 NOTE — Telephone Encounter (Signed)
-----   Message from [name], MD sent at 04/08/2019  5:15 PM EST ----- Hello Dr. Jake Shark you be able to connect with this patient for a brief counseling intervention? I also did CCM referral for Deb to connect her to outside resources once she completes brief intervention with you. Please let me know what you recommend. Thanks.

## 2019-04-09 NOTE — Telephone Encounter (Signed)
Attempted to call pt to schedule BH appt. Left VM

## 2019-04-10 ENCOUNTER — Other Ambulatory Visit: Payer: Self-pay | Admitting: Family Medicine

## 2019-04-11 ENCOUNTER — Other Ambulatory Visit: Payer: Self-pay

## 2019-04-11 ENCOUNTER — Ambulatory Visit: Payer: 59

## 2019-04-11 NOTE — Chronic Care Management (AMB) (Signed)
  Care Management   Outreach Note  04/11/2019 Name: Andrea [name] MRN: [id number] DOB: [birth year]  Referred by: [name], MD Reason for referral : Care Coordination (Care Management RNCM CHF/ HTN)   An unsuccessful telephone outreach was attempted today. The patient was referred to the case management team for assistance with care management and care coordination.   Follow Up Plan: A HIPPA compliant phone message was left for the patient providing contact information and requesting a return call.  The care management team will reach out to the patient again over the next 5-7 days.   Lazaro Arms RN, BSN, [facility] Care Management Coordinator [place] Phone: [phone] Fax: [phone]

## 2019-04-14 ENCOUNTER — Ambulatory Visit (HOSPITAL_COMMUNITY)
Admission: RE | Admit: 2019-04-14 | Discharge: 2019-04-14 | Disposition: A | Discharge status: Home / Self Care | Payer: 59 | Source: Ambulatory Visit | Attending: Family Medicine | Admitting: Family Medicine

## 2019-04-14 ENCOUNTER — Other Ambulatory Visit (HOSPITAL_COMMUNITY): Payer: 59

## 2019-04-14 ENCOUNTER — Other Ambulatory Visit: Payer: Self-pay

## 2019-04-14 DIAGNOSIS — I440 Atrioventricular block, first degree: Secondary | ICD-10-CM | POA: Diagnosis not present

## 2019-04-14 DIAGNOSIS — E785 Hyperlipidemia, unspecified: Secondary | ICD-10-CM | POA: Diagnosis not present

## 2019-04-14 DIAGNOSIS — I110 Hypertensive heart disease with heart failure: Secondary | ICD-10-CM | POA: Diagnosis not present

## 2019-04-14 DIAGNOSIS — I358 Other nonrheumatic aortic valve disorders: Secondary | ICD-10-CM | POA: Diagnosis not present

## 2019-04-14 DIAGNOSIS — R0600 Dyspnea, unspecified: Secondary | ICD-10-CM | POA: Diagnosis not present

## 2019-04-14 DIAGNOSIS — I509 Heart failure, unspecified: Secondary | ICD-10-CM

## 2019-04-14 DIAGNOSIS — I10 Essential (primary) hypertension: Secondary | ICD-10-CM

## 2019-04-14 NOTE — Progress Notes (Signed)
  Echocardiogram 2D Echocardiogram has been performed.  Andrea [name] 04/14/2019, 11:55 AM

## 2019-04-15 ENCOUNTER — Ambulatory Visit: Payer: Self-pay | Admitting: Licensed Clinical Social Worker

## 2019-04-15 ENCOUNTER — Telehealth: Payer: Self-pay | Admitting: Family Medicine

## 2019-04-15 NOTE — Chronic Care Management (AMB) (Signed)
    Clinical Social Work  Care Management Outreach   04/15/2019 Name: Andrea [name] MRN: [id number] DOB: 1956/01/27  Andrea [name] is a 64 y.o. year old female who is a primary care patient of [name], MD .   The Care Management team was consulted for assistance with Mental Health Counseling and Resources.   LCSW reached out to [place] today by phone to introduce self and schedule phone appointment for Care Management assessment.     Review of patient status, including review of consultants reports, relevant laboratory and other test results, and collaboration with appropriate care team members and the patient's provider was performed as part of comprehensive patient evaluation and provision of care management services.    Follow Up Plan: Phone appointment scheduled with LCSW Friday 04/18/19  Casimer Lanius, [place] Clinical Social Worker [place] / [place]   [phone] 9:11 AM

## 2019-04-15 NOTE — Telephone Encounter (Signed)
ECHO result discussed. Normal EF and diastolic function. She is yet to get her ECHO. Continue albuterol as needed. PFT in the future. She agreed with the plan.

## 2019-04-16 ENCOUNTER — Ambulatory Visit: Payer: Self-pay

## 2019-04-16 NOTE — Chronic Care Management (AMB) (Signed)
Care Management   Follow Up Note   04/16/2019 Name: Andrea [name] MRN: [id number] DOB: May 15, 1955  Referred by: [name], MD Reason for referral : Care Coordination (Care Management RNCM HTN/CHF)   Andrea [name] is a 64 y.o. year old female who is a primary care patient of [name], MD. The care management team was consulted for assistance with care management and care coordination needs.    Review of patient status, including review of consultants reports, relevant laboratory and other test results, and collaboration with appropriate care team members and the patient's provider was performed as part of comprehensive patient evaluation and provision of chronic care management services.    SDOH (Social Determinants of Health) screening performed today: None. See Care Plan for related entries.   Advanced Directives: See Care Plan and Vynca application for related entries.   Goals Addressed            This Visit's Progress   . 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 . Non-adherence to prescribed medication regimen Patient was taking her medication at one time  Nurse Case Manager Clinical Goal(s):  [name] Over the next 90 days, patient will verbalize understanding of plan for HTN/CHF . Over the next 90 days, patient will work with [facility] to address needs related to monitoring BP and CHF   Interventions:  . Evaluation of current treatment plan related to HTN/CHF and patient's adherence to plan as established by provider. . Advised patient to check BP/Weight and record daily. Patient states that she will check her bp 1 to 2 times a week and record the  values. Encouraged the patient to drink water, monitor salt intake . Patient states that she is not checking her bp she forgets.  She states that she  is going to put up a note to remind herself to check her blood pressure and track trends . [name] Explained to her the importance of checking her pressure and weighing recording the values.  Being able to track trends . Patient is taking her medication 1 in am and 1 in pm . Reviewed medications with patient  . encouraged her to continue to take her medication bid . Discussed low sodium diet . Talked with the patient about symptoms of CHF . Discussed with the patient about weighing /she does not weigh daily . Patient will make an appointment at the beginning of the year to discuss episode of shob and being tired with Dr [name]. . Patient sent educational material : A Matter of choice Blood Pressure Control, Living better with Heart Failure   03/06/19 . Patient states that she received the educational books but has not had time to read them yet.  Encouraged her to look them over. . Patient states that she had to but a new BP cuff.  She now has   wrist cuff.  Her reading from yesterday was 143/122 p 85.  Advised her the bottom number was high.  Advise her to 1. When checking her BP have cuff at heart level. 2. Rest for 5 minutes before checking.  3. Cut down on her caffeinated drinks. . Drink more water . Her weight is 271 lbs.  Advised her to continue to weight daily and record her weight. . Monitor her sodium intake.  She states she  is eating a lot of chip.  Explained to her how the salt affects her body with the retention of water weight and affecting her blood pressure. . Reviewed medications.  She is taking her meds as prescribed.  . 03/21/19 . Patient states that she has a wrist monitor but has not been checking her BP. [name] She has a scale and has not checked her weight. . She has not reviewed the material that I had mailed to her. . She states that she is taking her medications as prescribed. . She denies any HA shob, CP . Reviewed rationale of why checking your BP and weight is  important. . 04/16/19 . Patient called me today to inform me that she went to her appointment on 2/9 . She states that her BP looked good 132/80  Her weight was up 278 lbs and she needs to work loosing weight. . Patient had an EKG perform and was notified that it was fine.  She needs to go back and have a x-ray completed. . Advised the patient to remember to check and record BP, and weights daily . Reviewed medications and how to take, discussed medications . Advised patient to read educational books that were sent to her about HTN and HF . 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   Patient Self Care Activities:  . Self administers medications as prescribed . Attends all scheduled provider appointments . Calls pharmacy for medication refills . Performs ADL's independently . Performs IADL's independently . Calls provider office for new concerns or questions . Does not adhere to prescribed medication regimen  Please see past updates related to this goal by clicking on the "Past Updates" button in the selected goal          The care management team will reach out to the patient again over the next 21 days.  The patient has been provided with contact information for the care management team and has been advised to call with any health related questions or concerns.   Lazaro Arms RN, BSN, [facility] Care Management Coordinator [place] Phone: [phone] Fax: [phone]

## 2019-04-18 ENCOUNTER — Ambulatory Visit: Payer: Self-pay | Admitting: Licensed Clinical Social Worker

## 2019-04-18 ENCOUNTER — Telehealth: Payer: 59

## 2019-04-18 ENCOUNTER — Other Ambulatory Visit: Payer: Self-pay

## 2019-04-18 NOTE — Chronic Care Management (AMB) (Signed)
   Social Work Care Management   Unsuccessful Phone Outreach  04/18/2019 Name: Andrea [name] MRN: [id number] DOB: 10/01/1955  Referred by: [name], MD Reason for referral : Care Coordination (mental health support and resources)   Andrea [name] is a 64 y.o. year old female who sees [name], MD for primary care. LCSW was consulted to assist patient with counseling resources.  Spoke to patient last week, she asked that LCSW call back to day at 4:00. Unsuccessful telephone outreach attempt to Andrea [name] today during the scheduled time.   A HIPPA compliant phone message was left for the patient providing contact information and requesting a return call. Patient has a phone appointment schedule with CCM RN care manager in March.  Plan:  1. LCSW will discontinue outreach calls but will gladly be available at any time to provide services to Andrea [name] if she calls the office. 2. RN care manager will consult with SW care manager if patient would like additional support and coordination with counseling resources.   Casimer Lanius, LCSW Clinical Social Worker [place] / [place]   [phone] 4:17 PM

## 2019-04-22 ENCOUNTER — Telehealth: Payer: 59

## 2019-04-24 ENCOUNTER — Ambulatory Visit: Payer: Self-pay | Admitting: Licensed Clinical Social Worker

## 2019-04-24 ENCOUNTER — Other Ambulatory Visit: Payer: Self-pay

## 2019-04-24 ENCOUNTER — Telehealth: Payer: 59

## 2019-04-24 NOTE — Chronic Care Management (AMB) (Signed)
    Clinical Social Work  Care Management Outreach   04/24/2019 Name: [name] MRN: [id number] DOB: [birth year]  Andrea [name] is a 64 y.o. year old female who is a primary care patient of [name], MD .  The Care Management team was consulted for assistance with Mental Health Counseling and Resources. Patient spoke to [facility] RN today and scheduled phone appointment with LCSW for 3:00 today.  LCSW reached out to [place] today by phone to assess needs and offer Care Management services and interventions.  Patient unable to talk and would like to call LCSW tomorrow. Phone number provided.   Review of patient status, including review of consultants reports, relevant laboratory and other test results, and collaboration with appropriate care team members and the patient's provider was performed as part of comprehensive patient evaluation and provision of care management services.    Follow Up Plan:  1. Patient will call LCSW 04/25/19 between 3:00 and 3:30 2. LCSW will wait for phone call  Casimer Lanius, [place] / [place]   [phone] 3:14 PM

## 2019-04-25 ENCOUNTER — Telehealth: Payer: 59

## 2019-04-25 ENCOUNTER — Ambulatory Visit: Payer: Self-pay | Admitting: Licensed Clinical Social Worker

## 2019-04-25 ENCOUNTER — Other Ambulatory Visit: Payer: Self-pay

## 2019-04-25 NOTE — Chronic Care Management (AMB) (Signed)
   Social Work Care Management  Referral Note  04/25/2019 Name: Andrea [name] MRN: [id number] DOB: September 05, 1955  Andrea [name] is a 64 y.o. year old female who sees [name], MD for primary care.  LCSW was consulted by PCP to assistance patient with Mental Health Counseling and Resources. Phone appointment scheduled today. Patient contacted LCSW she is unable to keep appointment today.    Plan: Will wait for patient to contact LCSW for needed interventions.  Casimer Lanius, LCSW Clinical Social Worker [place] / [place]   [phone] 3:40 PM

## 2019-04-28 ENCOUNTER — Other Ambulatory Visit: Payer: Self-pay

## 2019-04-28 ENCOUNTER — Ambulatory Visit (HOSPITAL_COMMUNITY)
Admission: RE | Admit: 2019-04-28 | Discharge: 2019-04-28 | Disposition: A | Discharge status: Home / Self Care | Payer: 59 | Source: Ambulatory Visit | Attending: Family Medicine | Admitting: Family Medicine

## 2019-04-28 DIAGNOSIS — I509 Heart failure, unspecified: Secondary | ICD-10-CM | POA: Diagnosis not present

## 2019-05-07 ENCOUNTER — Other Ambulatory Visit: Payer: Self-pay

## 2019-05-07 ENCOUNTER — Ambulatory Visit: Payer: 59

## 2019-05-07 NOTE — Chronic Care Management (AMB) (Signed)
Care Management   Follow Up Note   05/07/2019 Name: Andrea [name] MRN: [id number] DOB: 17-Jan-1956  Referred by: [name], MD Reason for referral : Care Coordination (Care Management RNCM CHF/HTN)   Andrea [name] is a 64 y.o. year old female who is a primary care patient of [name], MD. The care management team was consulted for assistance with care management and care coordination needs.    Review of patient status, including review of consultants reports, relevant laboratory and other test results, and collaboration with appropriate care team members and the patient's provider was performed as part of comprehensive patient evaluation and provision of chronic care management services.    SDOH (Social Determinants of Health) assessments performed: No See Care Plan activities for detailed interventions related to [facility])     Advanced Directives: See Care Plan and Vynca application for related entries.   Goals Addressed            This Visit's Progress   . 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 . Non-adherence to prescribed medication regimen Patient was taking her medication at one time  Nurse Case Manager Clinical Goal(s):  [name] Over the next 90 days, patient will verbalize understanding of plan for HTN/CHF . Over the next 90 days, patient will work with [facility] to address needs related to monitoring BP and CHF   Interventions:  . Evaluation of current treatment plan related to HTN/CHF and patient's adherence to plan as established by provider. . Advised patient to check BP/Weight and record daily. Patient states that she will check her bp 1 to 2 times a week and record the  values. Encouraged the patient to drink water, monitor salt intake . Patient states that she is not checking her bp  she forgets.  She states that she is going to put up a note to remind herself to check her blood pressure and track trends . [name] Explained to her the importance of checking her pressure and weighing recording the values.  Being able to track trends . Patient is taking her medication 1 in am and 1 in pm . Reviewed medications with patient  . encouraged her to continue to take her medication bid . Discussed low sodium diet . Talked with the patient about symptoms of CHF . Discussed with the patient about weighing /she does not weigh daily . Patient will make an appointment at the beginning of the year to discuss episode of shob and being tired with Dr [name]. . Patient sent educational material : A Matter of choice Blood Pressure Control, Living better with Heart Failure   03/06/19 . Patient states that she received the educational books but has not had time to read them yet.  Encouraged her to look them over. . Patient states that she had to but a new BP cuff.  She now has   wrist cuff.  Her reading from yesterday was 143/122 p 85.  Advised her the bottom number was high.  Advise her to 1. When checking her BP have cuff at heart level. 2. Rest for 5 minutes before checking.  3. Cut down on her caffeinated drinks. . Drink more water . Her weight is 271 lbs.  Advised her to continue to weight daily and record her weight. . Monitor her sodium  intake.  She states she is eating a lot of chip.  Explained to her how the salt affects her body with the retention of water weight and affecting her blood pressure. . Reviewed medications.  She is taking her meds as prescribed.  . 03/21/19 . Patient states that she has a wrist monitor but has not been checking her BP. [name] She has a scale and has not checked her weight. . She has not reviewed the material that I had mailed to her. . She states that she is taking her medications as prescribed. . She denies any HA shob, CP . Reviewed rationale of why checking your  BP and weight is important. . 04/16/19 . Patient called me today to inform me that she went to her appointment on 2/9 . She states that her BP looked good 132/80  Her weight was up 278 lbs and she needs to work loosing weight. . Patient had an EKG perform and was notified that it was fine.  She needs to go back and have a x-ray completed. . Advised the patient to remember to check and record BP, and weights daily . Reviewed medications and how to take, discussed medications . Advised patient to read educational books that were sent to her about HTN and HF . 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  05/07/19  o Patient states that she was at [employer] trying to take her mind off things o She checked her blood pressure yesterday and it was 155/68 o She states that she has chest pain "every now and then", some swelling and shortness of breath with exertion, she feels comes with being overweight. o She has not checked her weight since she has been at the doctor's office  o She states that she is eating more vegetables, some fruits o Not much water, soda not monitoring salt and eating out a lot  o Advised the patient to drink more water, decrease a sodium, sodas, fried foods.  If she is going to eat out a lot.   I will try to find her a booklet of restaurants that will give her ideas of foods calories that are better if she is going to eat out   Patient Self Care Activities:  . Self administers medications as prescribed . Attends all scheduled provider appointments . Calls pharmacy for medication refills . Performs ADL's independently . Performs IADL's independently . Calls provider office for new concerns or questions . Does not adhere to prescribed medication regimen  Please see past updates related to this goal by clicking on the "Past Updates" button in the selected goal            The care management team will reach out to the patient again over the next 14 days.  The patient has been provided with contact information for the care management team and has been advised to call with any health related questions or concerns.   Lazaro Arms RN, BSN, [facility] Care Management Coordinator [place] Phone: [phone] Fax: [phone]

## 2019-05-07 NOTE — Patient Instructions (Signed)
Visit Information  Goals Addressed            This Visit's Progress   . 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 . Non-adherence to prescribed medication regimen Patient was taking her medication at one time  Nurse Case Manager Clinical Goal(s):  Andrea [name] Over the next 90 days, patient will verbalize understanding of plan for HTN/CHF . Over the next 90 days, patient will work with [facility] to address needs related to monitoring BP and CHF   Interventions:  . Evaluation of current treatment plan related to HTN/CHF and patient's adherence to plan as established by provider. . Advised patient to check BP/Weight and record daily. Patient states that she will check her bp 1 to 2 times a week and record the  values. Encouraged the patient to drink water, monitor salt intake . Patient states that she is not checking her bp she forgets.  She states that she is going to put up a note to remind herself to check her blood pressure and track trends . Andrea [name] Explained to her the importance of checking her pressure and weighing recording the values.  Being able to track trends . Patient is taking her medication 1 in am and 1 in pm . Reviewed medications with patient  . encouraged her to continue to take her medication bid . Discussed low sodium diet . Talked with the patient about symptoms of CHF . Discussed with the patient about weighing /she does not weigh daily . Patient will make an appointment at the beginning of the year to discuss episode of shob and being tired with Dr Gwendlyn [name]. . Patient sent educational material : A Matter of choice Blood Pressure Control, Living better with Heart Failure   03/06/19 . Patient states that she received the educational books but has not had time to read them yet.  Encouraged her to look them over. . Patient  states that she had to but a new BP cuff.  She now has   wrist cuff.  Her reading from yesterday was 143/122 p 85.  Advised her the bottom number was high.  Advise her to 1. When checking her BP have cuff at heart level. 2. Rest for 5 minutes before checking.  3. Cut down on her caffeinated drinks. . Drink more water . Her weight is 271 lbs.  Advised her to continue to weight daily and record her weight. . Monitor her sodium intake.  She states she is eating a lot of chip.  Explained to her how the salt affects her body with the retention of water weight and affecting her blood pressure. . Reviewed medications.  She is taking her meds as prescribed.  . 03/21/19 . Patient states that she has a wrist monitor but has not been checking her BP. Andrea [name] She has a scale and has not checked her weight. . She has not reviewed the material that I had mailed to her. . She states that she is taking her medications as prescribed. . She denies any HA shob, CP . Reviewed rationale of why checking your BP and weight is important. . 04/16/19 . Patient called me today to inform me that she went to her appointment on 2/9 . She states that her BP looked good 132/80  Her weight was up 278 lbs and she  needs to work loosing weight. . Patient had an EKG perform and was notified that it was fine.  She needs to go back and have a x-ray completed. . Advised the patient to remember to check and record BP, and weights daily . Reviewed medications and how to take, discussed medications . Advised patient to read educational books that were sent to her about HTN and HF . 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  05/07/19  o Patient states that she was at [employer] trying to take her mind off things o She checked her blood pressure yesterday and it was 155/68 o She states that she has chest pain "every now and then",  some swelling and shortness of breath with exertion, she feels comes with being overweight. o She has not checked her weight since she has been at the doctor's office  o She states that she is eating more vegetables, some fruits o Not much water, soda not monitoring salt and eating out a lot  o Advised the patient to drink more water, decrease a sodium, sodas, fried foods.  If she is going to eat out a lot.   I will try to find her a booklet of restaurants that will give her ideas of foods calories that are better if she is going to eat out   Patient Self Care Activities:  . Self administers medications as prescribed . Attends all scheduled provider appointments . Calls pharmacy for medication refills . Performs ADL's independently . Performs IADL's independently . Calls provider office for new concerns or questions . Does not adhere to prescribed medication regimen  Please see past updates related to this goal by clicking on the "Past Updates" button in the selected goal         Andrea [name] was given information about Care Management services today including:  1. Care Management services include personalized support from designated clinical staff supervised by her physician, including individualized plan of care and coordination with other care providers 2. 24/7 contact phone numbers for assistance for urgent and routine care needs. 3. The patient may stop CCM services at any time (effective at the end of the month) by phone call to the office staff.  Patient agreed to services and verbal consent obtained.   The patient verbalized understanding of instructions provided today and declined a print copy of patient instruction materials.   The care management team will reach out to the patient again over the next 14 days.  The patient has been provided with contact information for the care management team and has been advised to call with any health related questions or concerns.    Andrea Arms RN, BSN, [facility] Care Management Coordinator [place] Phone: [phone] Fax: [phone]

## 2019-05-21 ENCOUNTER — Ambulatory Visit: Payer: 59

## 2019-05-21 ENCOUNTER — Other Ambulatory Visit: Payer: Self-pay

## 2019-05-22 ENCOUNTER — Telehealth: Payer: 59

## 2019-05-31 NOTE — Patient Instructions (Signed)
Visit Information  Goals Addressed            This Visit's Progress   . 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 . Non-adherence to prescribed medication regimen Patient was taking her medication at one time  Nurse Case Manager Clinical Goal(s):  [name] Over the next 90 days, patient will verbalize understanding of plan for HTN/CHF . Over the next 90 days, patient will work with [facility] to address needs related to monitoring BP and CHF   Interventions:  . Evaluation of current treatment plan related to HTN/CHF and patient's adherence to plan as established by provider. . Advised patient to check BP/Weight and record daily. Patient states that she will check her bp 1 to 2 times a week and record the  values. Encouraged the patient to drink water, monitor salt intake . Patient states that she is not checking her bp she forgets.  She states that she is going to put up a note to remind herself to check her blood pressure and track trends . [name] Explained to her the importance of checking her pressure and weighing recording the values.  Being able to track trends . Patient is taking her medication 1 in am and 1 in pm . Reviewed medications with patient  . encouraged her to continue to take her medication bid . Discussed low sodium diet . Talked with the patient about symptoms of CHF . Discussed with the patient about weighing /she does not weigh daily . Patient will make an appointment at the beginning of the year to discuss episode of shob and being tired with Dr [name]. . Patient sent educational material : A Matter of choice Blood Pressure Control, Living better with Heart Failure   03/06/19 . Patient states that she received the educational books but has not had time to read them yet.  Encouraged her to look them over. . Patient  states that she had to but a new BP cuff.  She now has   wrist cuff.  Her reading from yesterday was 143/122 p 85.  Advised her the bottom number was high.  Advise her to 1. When checking her BP have cuff at heart level. 2. Rest for 5 minutes before checking.  3. Cut down on her caffeinated drinks. . Drink more water . Her weight is 271 lbs.  Advised her to continue to weight daily and record her weight. . Monitor her sodium intake.  She states she is eating a lot of chip.  Explained to her how the salt affects her body with the retention of water weight and affecting her blood pressure. . Reviewed medications.  She is taking her meds as prescribed.  . 03/21/19 . Patient states that she has a wrist monitor but has not been checking her BP. [name] She has a scale and has not checked her weight. . She has not reviewed the material that I had mailed to her. . She states that she is taking her medications as prescribed. . She denies any HA shob, CP . Reviewed rationale of why checking your BP and weight is important. . 04/16/19 . Patient called me today to inform me that she went to her appointment on 2/9 . She states that her BP looked good 132/80  Her weight was up 278 lbs and she  needs to work loosing weight. . Patient had an EKG perform and was notified that it was fine.  She needs to go back and have a x-ray completed. . Advised the patient to remember to check and record BP, and weights daily . Reviewed medications and how to take, discussed medications . Advised patient to read educational books that were sent to her about HTN and HF . 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  05/07/19  o Patient states that she was at [employer] trying to take her mind off things o She checked her blood pressure yesterday and it was 155/68 o She states that she has chest pain "every now and then",  some swelling and shortness of breath with exertion, she feels comes with being overweight. o She has not checked her weight since she has been at the doctor's office  o She states that she is eating more vegetables, some fruits o Not much water, soda not monitoring salt and eating out a lot  o Advised the patient to drink more water, decrease a sodium, sodas, fried foods.  If she is going to eat out a lot.   I will try to find her a booklet of restaurants that will give her ideas of foods calories that are better if she is going to eat out o 05/21/19 o The patient states that her weight is 271 lbs.  o She is not checking her BP regularly last she checked it was 168/68.  We discussed about high bp pressures I asked has she read the information that I sent her.  She stated no. She did receive the information I sent her about eating out and picking food that are good for her and trying to watch the salt content. o Discussed about drinking more water o She denies any chest pain,  shob, swelling, ha, She does have a pain in her neck from time to time but thinks that it come from the way she lays at night. o She states that she is not exercising.  She states that she is too tired.  She said she walks a lot at work. o She states that she is in the process of looking for an apartment.  Her sister wants a house and she does not. o No appointments set soon.   Patient Self Care Activities:  . Self administers medications as prescribed . Attends all scheduled provider appointments . Calls pharmacy for medication refills . Performs ADL's independently . Performs IADL's independently . Calls provider office for new concerns or questions . Does not adhere to prescribed medication regimen  Please see past updates related to this goal by clicking on the "Past Updates" button in the selected goal         Ms. Hakimian was given information about Care Management services today including:  1. Care Management  services include personalized support from designated clinical staff supervised by her physician, including individualized plan of care and coordination with other care providers 2. 24/7 contact phone numbers for assistance for urgent and routine care needs. 3. The patient may stop CCM services at any time (effective at the end of the month) by phone call to the office staff.  Patient agreed to services and verbal consent obtained.   The patient verbalized understanding of instructions provided today and declined a print copy of patient instruction  materials.   The care management team will reach out to the patient again over the next 21 days.  The patient has been provided with contact information for the care management team and has been advised to call with any health related questions or concerns.   Lazaro Arms RN, BSN, [facility] Care Management Coordinator [place] Phone: [phone] Fax: [phone]

## 2019-05-31 NOTE — Chronic Care Management (AMB) (Signed)
Care Management   Follow Up Note   05/31/2019 Name: Andrea [name] MRN: [id number] DOB: 1955/08/26  Referred by: [name], MD Reason for referral : Care Coordination (Care Management  RNCM CHF/HTN)   Andrea [name] is a 64 y.o. year old female who is a primary care patient of [name], MD. The care management team was consulted for assistance with care management and care coordination needs.    Review of patient status, including review of consultants reports, relevant laboratory and other test results, and collaboration with appropriate care team members and the patient's provider was performed as part of comprehensive patient evaluation and provision of chronic care management services.    SDOH (Social Determinants of Health) assessments performed: No See Care Plan activities for detailed interventions related to [facility])     Advanced Directives: See Care Plan and Vynca application for related entries.   Goals Addressed            This Visit's Progress   . 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 . Non-adherence to prescribed medication regimen Patient was taking her medication at one time  Nurse Case Manager Clinical Goal(s):  [name] Over the next 90 days, patient will verbalize understanding of plan for HTN/CHF . Over the next 90 days, patient will work with [facility] to address needs related to monitoring BP and CHF   Interventions:  . Evaluation of current treatment plan related to HTN/CHF and patient's adherence to plan as established by provider. . Advised patient to check BP/Weight and record daily. Patient states that she will check her bp 1 to 2 times a week and record the  values. Encouraged the patient to drink water, monitor salt intake . Patient states that she is not checking her bp  she forgets.  She states that she is going to put up a note to remind herself to check her blood pressure and track trends . [name] Explained to her the importance of checking her pressure and weighing recording the values.  Being able to track trends . Patient is taking her medication 1 in am and 1 in pm . Reviewed medications with patient  . encouraged her to continue to take her medication bid . Discussed low sodium diet . Talked with the patient about symptoms of CHF . Discussed with the patient about weighing /she does not weigh daily . Patient will make an appointment at the beginning of the year to discuss episode of shob and being tired with Dr [name]. . Patient sent educational material : A Matter of choice Blood Pressure Control, Living better with Heart Failure   03/06/19 . Patient states that she received the educational books but has not had time to read them yet.  Encouraged her to look them over. . Patient states that she had to but a new BP cuff.  She now has   wrist cuff.  Her reading from yesterday was 143/122 p 85.  Advised her the bottom number was high.  Advise her to 1. When checking her BP have cuff at heart level. 2. Rest for 5 minutes before checking.  3. Cut down on her caffeinated drinks. . Drink more water . Her weight is 271 lbs.  Advised her to continue to weight daily and record her weight. . Monitor her  sodium intake.  She states she is eating a lot of chip.  Explained to her how the salt affects her body with the retention of water weight and affecting her blood pressure. . Reviewed medications.  She is taking her meds as prescribed.  . 03/21/19 . Patient states that she has a wrist monitor but has not been checking her BP. [name] She has a scale and has not checked her weight. . She has not reviewed the material that I had mailed to her. . She states that she is taking her medications as prescribed. . She denies any HA shob, CP . Reviewed rationale of why checking your  BP and weight is important. . 04/16/19 . Patient called me today to inform me that she went to her appointment on 2/9 . She states that her BP looked good 132/80  Her weight was up 278 lbs and she needs to work loosing weight. . Patient had an EKG perform and was notified that it was fine.  She needs to go back and have a x-ray completed. . Advised the patient to remember to check and record BP, and weights daily . Reviewed medications and how to take, discussed medications . Advised patient to read educational books that were sent to her about HTN and HF . 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  05/07/19  o Patient states that she was at [employer] trying to take her mind off things o She checked her blood pressure yesterday and it was 155/68 o She states that she has chest pain "every now and then", some swelling and shortness of breath with exertion, she feels comes with being overweight. o She has not checked her weight since she has been at the doctor's office  o She states that she is eating more vegetables, some fruits o Not much water, soda not monitoring salt and eating out a lot  o Advised the patient to drink more water, decrease a sodium, sodas, fried foods.  If she is going to eat out a lot.   I will try to find her a booklet of restaurants that will give her ideas of foods calories that are better if she is going to eat out o 05/21/19 o The patient states that her weight is 271 lbs.  o She is not checking her BP regularly last she checked it was 168/68.  We discussed about high bp pressures I asked has she read the information that I sent her.  She stated no. She did receive the information I sent her about eating out and picking food that are good for her and trying to watch the salt content. o Discussed about drinking more water o She denies any chest pain,  shob,  swelling, ha, She does have a pain in her neck from time to time but thinks that it come from the way she lays at night. o She states that she is not exercising.  She states that she is too tired.  She said she walks a lot at work. o She states that she is in the process of looking for an apartment.  Her sister wants a house and she does not. o No appointments set soon.   Patient Self Care Activities:  . Self administers medications as prescribed . Attends all scheduled provider appointments . Calls pharmacy for medication refills . Performs  ADL's independently . Performs IADL's independently . Calls provider office for new concerns or questions . Does not adhere to prescribed medication regimen  Please see past updates related to this goal by clicking on the "Past Updates" button in the selected goal          The care management team will reach out to the patient again over the next 21 days.  The patient has been provided with contact information for the care management team and has been advised to call with any health related questions or concerns.   Lazaro Arms RN, BSN, [facility] Care Management Coordinator [place] Phone: [phone] Fax: [phone]

## 2019-06-05 ENCOUNTER — Other Ambulatory Visit: Payer: Self-pay

## 2019-06-05 ENCOUNTER — Ambulatory Visit: Payer: 59

## 2019-06-13 ENCOUNTER — Other Ambulatory Visit: Payer: Self-pay

## 2019-06-13 ENCOUNTER — Telehealth: Payer: 59

## 2019-06-19 NOTE — Chronic Care Management (AMB) (Signed)
Care Management   Follow Up Note   06/19/2019 Name: Andrea [name] MRN: [id number] DOB: 01/25/1956  Referred by: [name], MD Reason for referral : Care Coordination (Care Management RNCM 2 week f/u )   Andrea [name] is a 64 y.o. year old female who is a primary care patient of [name], MD. The care management team was consulted for assistance with care management and care coordination needs.    Review of patient status, including review of consultants reports, relevant laboratory and other test results, and collaboration with appropriate care team members and the patient's provider was performed as part of comprehensive patient evaluation and provision of chronic care management services.    SDOH (Social Determinants of Health) assessments performed: No See Care Plan activities for detailed interventions related to [facility])     Advanced Directives: See Care Plan and Vynca application for related entries.   Goals Addressed            This Visit's Progress   . 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 . Non-adherence to prescribed medication regimen Patient was taking her medication at one time  Nurse Case Manager Clinical Goal(s):  [name] Over the next 90 days, patient will verbalize understanding of plan for HTN/CHF . Over the next 90 days, patient will work with [facility] to address needs related to monitoring BP and CHF   Interventions:  . Evaluation of current treatment plan related to HTN/CHF and patient's adherence to plan as established by provider. . Advised patient to check BP/Weight and record daily. Patient states that she will check her bp 1 to 2 times a week and record the  values. Encouraged the patient to drink water, monitor salt intake . Patient states that she is not checking her  bp she forgets.  She states that she is going to put up a note to remind herself to check her blood pressure and track trends . [name] Explained to her the importance of checking her pressure and weighing recording the values.  Being able to track trends . Patient is taking her medication 1 in am and 1 in pm . Reviewed medications with patient  . encouraged her to continue to take her medication bid . Discussed low sodium diet . Talked with the patient about symptoms of CHF . Discussed with the patient about weighing /she does not weigh daily . Patient will make an appointment at the beginning of the year to discuss episode of shob and being tired with Dr [name]. . Patient sent educational material : A Matter of choice Blood Pressure Control, Living better with Heart Failure   03/06/19 . Patient states that she received the educational books but has not had time to read them yet.  Encouraged her to look them over. . Patient states that she had to but a new BP cuff.  She now has   wrist cuff.  Her reading from yesterday was 143/122 p 85.  Advised her the bottom number was high.  Advise her to 1. When checking her BP have cuff at heart level. 2. Rest for 5 minutes before checking.  3. Cut down on her caffeinated drinks. . Drink more water . Her weight is 271 lbs.  Advised her to continue to weight daily and record her weight. [name]  Monitor her sodium intake.  She states she is eating a lot of chip.  Explained to her how the salt affects her body with the retention of water weight and affecting her blood pressure. . Reviewed medications.  She is taking her meds as prescribed.  . 03/21/19 . Patient states that she has a wrist monitor but has not been checking her BP. [name] She has a scale and has not checked her weight. . She has not reviewed the material that I had mailed to her. . She states that she is taking her medications as prescribed. . She denies any HA shob, CP . Reviewed rationale of why checking  your BP and weight is important. . 04/16/19 . Patient called me today to inform me that she went to her appointment on 2/9 . She states that her BP looked good 132/80  Her weight was up 278 lbs and she needs to work loosing weight. . Patient had an EKG perform and was notified that it was fine.  She needs to go back and have a x-ray completed. . Advised the patient to remember to check and record BP, and weights daily . Reviewed medications and how to take, discussed medications . Advised patient to read educational books that were sent to her about HTN and HF . 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  05/07/19  o Patient states that she was at [employer] trying to take her mind off things o She checked her blood pressure yesterday and it was 155/68 o She states that she has chest pain "every now and then", some swelling and shortness of breath with exertion, she feels comes with being overweight. o She has not checked her weight since she has been at the doctor's office  o She states that she is eating more vegetables, some fruits o Not much water, soda not monitoring salt and eating out a lot  o Advised the patient to drink more water, decrease a sodium, sodas, fried foods.  If she is going to eat out a lot.   I will try to find her a booklet of restaurants that will give her ideas of foods calories that are better if she is going to eat out o 05/21/19 o The patient states that her weight is 271 lbs.  o She is not checking her BP regularly last she checked it was 168/68.  We discussed about high bp pressures I asked has she read the information that I sent her.  She stated no. She did receive the information I sent her about eating out and picking food that are good for her and trying to watch the salt content. o Discussed about drinking more water o She denies any chest pain,   shob, swelling, ha, She does have a pain in her neck from time to time but thinks that it come from the way she lays at night. o She states that she is not exercising.  She states that she is too tired.  She said she walks a lot at work. o She states that she is in the process of looking for an apartment.  Her sister wants a house and she does not. o No appointments set soon. o 06/05/19 o Patient states that she is not checking her BP like she should but she did check it one time and it was  168/72   o She has not checked her weight recently the last time it was 271 lbs. o She denies any chest pain, swelling but does have shob with exertion but feels that it comes from the weight. o She states that she is eating better and making better choices when eating out.  She received the information I sent her out picking meals when she eats out. o She is taking her medications as prescribed o We will spread her next appointment out for 3 weeks and she stated that she will have some BP readings for me then.   Patient Self Care Activities:  . Self administers medications as prescribed . Attends all scheduled provider appointments . Calls pharmacy for medication refills . Performs ADL's independently . Performs IADL's independently . Calls provider office for new concerns or questions . Does not adhere to prescribed medication regimen  Please see past updates related to this goal by clicking on the "Past Updates" button in the selected goal          The care management team will reach out to the patient again over the next 21 days.  The patient has been provided with contact information for the care management team and has been advised to call with any health related questions or concerns.   Lazaro Arms RN, BSN, [facility] Care Management Coordinator [place] Phone: [phone] Fax: [phone]

## 2019-06-19 NOTE — Patient Instructions (Signed)
Visit Information  Goals Addressed            This Visit's Progress   . 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 . Non-adherence to prescribed medication regimen Patient was taking her medication at one time  Nurse Case Manager Clinical Goal(s):  [name] Over the next 90 days, patient will verbalize understanding of plan for HTN/CHF . Over the next 90 days, patient will work with [facility] to address needs related to monitoring BP and CHF   Interventions:  . Evaluation of current treatment plan related to HTN/CHF and patient's adherence to plan as established by provider. . Advised patient to check BP/Weight and record daily. Patient states that she will check her bp 1 to 2 times a week and record the  values. Encouraged the patient to drink water, monitor salt intake . Patient states that she is not checking her bp she forgets.  She states that she is going to put up a note to remind herself to check her blood pressure and track trends . [name] Explained to her the importance of checking her pressure and weighing recording the values.  Being able to track trends . Patient is taking her medication 1 in am and 1 in pm . Reviewed medications with patient  . encouraged her to continue to take her medication bid . Discussed low sodium diet . Talked with the patient about symptoms of CHF . Discussed with the patient about weighing /she does not weigh daily . Patient will make an appointment at the beginning of the year to discuss episode of shob and being tired with Dr [name]. . Patient sent educational material : A Matter of choice Blood Pressure Control, Living better with Heart Failure   03/06/19 . Patient states that she received the educational books but has not had time to read them yet.  Encouraged her to look them over. . Patient  states that she had to but a new BP cuff.  She now has   wrist cuff.  Her reading from yesterday was 143/122 p 85.  Advised her the bottom number was high.  Advise her to 1. When checking her BP have cuff at heart level. 2. Rest for 5 minutes before checking.  3. Cut down on her caffeinated drinks. . Drink more water . Her weight is 271 lbs.  Advised her to continue to weight daily and record her weight. . Monitor her sodium intake.  She states she is eating a lot of chip.  Explained to her how the salt affects her body with the retention of water weight and affecting her blood pressure. . Reviewed medications.  She is taking her meds as prescribed.  . 03/21/19 . Patient states that she has a wrist monitor but has not been checking her BP. [name] She has a scale and has not checked her weight. . She has not reviewed the material that I had mailed to her. . She states that she is taking her medications as prescribed. . She denies any HA shob, CP . Reviewed rationale of why checking your BP and weight is important. . 04/16/19 . Patient called me today to inform me that she went to her appointment on 2/9 . She states that her BP looked good 132/80  Her weight was up 278 lbs and she  needs to work loosing weight. . Patient had an EKG perform and was notified that it was fine.  She needs to go back and have a x-ray completed. . Advised the patient to remember to check and record BP, and weights daily . Reviewed medications and how to take, discussed medications . Advised patient to read educational books that were sent to her about HTN and HF . 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  05/07/19  o Patient states that she was at [employer] trying to take her mind off things o She checked her blood pressure yesterday and it was 155/68 o She states that she has chest pain "every now and then",  some swelling and shortness of breath with exertion, she feels comes with being overweight. o She has not checked her weight since she has been at the doctor's office  o She states that she is eating more vegetables, some fruits o Not much water, soda not monitoring salt and eating out a lot  o Advised the patient to drink more water, decrease a sodium, sodas, fried foods.  If she is going to eat out a lot.   I will try to find her a booklet of restaurants that will give her ideas of foods calories that are better if she is going to eat out o 05/21/19 o The patient states that her weight is 271 lbs.  o She is not checking her BP regularly last she checked it was 168/68.  We discussed about high bp pressures I asked has she read the information that I sent her.  She stated no. She did receive the information I sent her about eating out and picking food that are good for her and trying to watch the salt content. o Discussed about drinking more water o She denies any chest pain,  shob, swelling, ha, She does have a pain in her neck from time to time but thinks that it come from the way she lays at night. o She states that she is not exercising.  She states that she is too tired.  She said she walks a lot at work. o She states that she is in the process of looking for an apartment.  Her sister wants a house and she does not. o No appointments set soon. o 06/05/19 o Patient states that she is not checking her BP like she should but she did check it one time and it was 168/72   o She has not checked her weight recently the last time it was 271 lbs. o She denies any chest pain, swelling but does have shob with exertion but feels that it comes from the weight. o She states that she is eating better and making better choices when eating out.  She received the information I sent her out picking meals when she eats out. o She is taking her medications as prescribed o We will spread her next appointment out for  3 weeks and she stated that she will have some BP readings for me then.   Patient Self Care Activities:  . Self administers medications as prescribed . Attends all scheduled provider appointments . Calls pharmacy for medication refills . Performs ADL's independently . Performs IADL's independently . Calls provider office for new concerns or questions . Does not adhere to prescribed medication regimen  Please see past  updates related to this goal by clicking on the "Past Updates" button in the selected goal         Ms. Eernisse was given information about Care Management services today including:  1. Care Management services include personalized support from designated clinical staff supervised by her physician, including individualized plan of care and coordination with other care providers 2. 24/7 contact phone numbers for assistance for urgent and routine care needs. 3. The patient may stop CCM services at any time (effective at the end of the month) by phone call to the office staff.  Patient agreed to services and verbal consent obtained.   The patient verbalized understanding of instructions provided today and declined a print copy of patient instruction materials.   The care management team will reach out to the patient again over the next 21 days.  The patient has been provided with contact information for the care management team and has been advised to call with any health related questions or concerns.   Lazaro Arms RN, BSN, [facility] Care Management Coordinator [place] Phone: [phone] Fax: [phone]

## 2019-06-26 ENCOUNTER — Ambulatory Visit: Payer: 59

## 2019-06-26 ENCOUNTER — Ambulatory Visit: Payer: Self-pay

## 2019-06-26 ENCOUNTER — Other Ambulatory Visit: Payer: Self-pay

## 2019-06-26 DIAGNOSIS — Z139 Encounter for screening, unspecified: Secondary | ICD-10-CM

## 2019-06-26 NOTE — Chronic Care Management (AMB) (Signed)
  Care Management   Outreach Note  06/26/2019 Name: Andrea [name] MRN: [id number] DOB: 1955/06/04  Referred by: [name], MD Reason for referral : No chief complaint on file.   An unsuccessful telephone outreach was attempted today. The patient was referred to the case management team for assistance with care management and care coordination.   Follow Up Plan: A HIPPA compliant phone message was left for the patient providing contact information and requesting a return call.  The care management team will reach out to the patient again over the next 7-14 days.   Lazaro Arms RN, BSN, [facility] Care Management Coordinator [place] Phone: [phone] Fax: [phone]

## 2019-06-27 NOTE — Chronic Care Management (AMB) (Signed)
Care Management   Follow Up Note   06/27/2019 Name: Andrea [name] MRN: [id number] DOB: 04/09/1955  Referred by: [name], MD Reason for referral : Care Coordination (Care Management RNCM # week f/u)   Andrea [name] is a 64 y.o. year old female who is a primary care patient of [name], MD. The care management team was consulted for assistance with care management and care coordination needs.    Review of patient status, including review of consultants reports, relevant laboratory and other test results, and collaboration with appropriate care team members and the patient's provider was performed as part of comprehensive patient evaluation and provision of chronic care management services.    SDOH (Social Determinants of Health) assessments performed: No See Care Plan activities for detailed interventions related to [facility])     Advanced Directives: See Care Plan and Vynca application for related entries.   Goals Addressed            This Visit's Progress   . 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 . Non-adherence to prescribed medication regimen Patient was taking her medication at one time  Nurse Case Manager Clinical Goal(s):  [name] Over the next 90 days, patient will verbalize understanding of plan for HTN/CHF . Over the next 90 days, patient will work with [facility] to address needs related to monitoring BP and CHF   Interventions:  . Evaluation of current treatment plan related to HTN/CHF and patient's adherence to plan as established by provider. . Advised patient to check BP/Weight and record daily. Patient states that she will check her bp 1 to 2 times a week and record the  values. Encouraged the patient to drink [name], monitor salt intake . Patient states that she is not checking her bp  she forgets.  She states that she is going to put up a note to remind herself to check her blood pressure and track trends . [name] Explained to her the importance of checking her pressure and weighing recording the values.  Being able to track trends . Patient is taking her medication 1 in am and 1 in pm . Reviewed medications with patient  . encouraged her to continue to take her medication bid . Discussed low sodium diet . Talked with the patient about symptoms of CHF . Discussed with the patient about weighing /she does not weigh daily . Patient will make an appointment at the beginning of the year to discuss episode of shob and being tired with Dr [name]. . Patient sent educational material : A Matter of choice Blood Pressure Control, Living better with Heart Failure   03/06/19 . Patient states that she received the educational books but has not had time to read them yet.  Encouraged her to look them over. . Patient states that she had to but a new BP cuff.  She now has   wrist cuff.  Her reading from yesterday was 143/122 p 85.  Advised her the bottom number was high.  Advise her to 1. When checking her BP have cuff at heart level. 2. Rest for 5 minutes before checking.  3. Cut down on her caffeinated drinks. . Drink more [name] . Her weight is 271 lbs.  Advised her to continue to weight daily and record her weight. . Monitor  her sodium intake.  She states she is eating a lot of chip.  Explained to her how the salt affects her body with the retention of [name] weight and affecting her blood pressure. . Reviewed medications.  She is taking her meds as prescribed.  . 03/21/19 . Patient states that she has a wrist monitor but has not been checking her BP. [name] She has a scale and has not checked her weight. . She has not reviewed the material that I had mailed to her. . She states that she is taking her medications as prescribed. . She denies any HA shob, CP . Reviewed rationale of why checking your  BP and weight is important. . 04/16/19 . Patient called me today to inform me that she went to her appointment on 2/9 . She states that her BP looked good 132/80  Her weight was up 278 lbs and she needs to work loosing weight. . Patient had an EKG perform and was notified that it was fine.  She needs to go back and have a x-ray completed. . Advised the patient to remember to check and record BP, and weights daily . Reviewed medications and how to take, discussed medications . Advised patient to read educational books that were sent to her about HTN and HF . 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  05/07/19  o Patient states that she was at [employer] trying to take her mind off things o She checked her blood pressure yesterday and it was 155/68 o She states that she has chest pain "every now and then", some swelling and shortness of breath with exertion, she feels comes with being overweight. o She has not checked her weight since she has been at the doctor's office  o She states that she is eating more vegetables, some fruits o Not much [name], soda not monitoring salt and eating out a lot  o Advised the patient to drink more [name], decrease a sodium, sodas, fried foods.  If she is going to eat out a lot.   I will try to find her a booklet of restaurants that will give her ideas of foods calories that are better if she is going to eat out o 05/21/19 o The patient states that her weight is 271 lbs.  o She is not checking her BP regularly last she checked it was 168/68.  We discussed about high bp pressures I asked has she read the information that I sent her.  She stated no. She did receive the information I sent her about eating out and picking food that are good for her and trying to watch the salt content. o Discussed about drinking more [name] o She denies any chest pain,  shob,  swelling, ha, She does have a pain in her neck from time to time but thinks that it come from the way she lays at night. o She states that she is not exercising.  She states that she is too tired.  She said she walks a lot at work. o She states that she is in the process of looking for an apartment.  Her sister wants a house and she does not. o No appointments set soon. o 06/05/19 o Patient states that she is not checking her BP like she should but she did check it one time and it was 168/72  o She has not checked her weight recently the last time it was 271 lbs. o She denies any chest pain, swelling but does have shob with exertion but feels that it comes from the weight. o She states that she is eating better and making better choices when eating out.  She received the information I sent her out picking meals when she eats out. o She is taking her medications as prescribed o We will spread her next appointment out for 3 weeks and she stated that she will have some BP readings for me then. o 06/26/19 o Spoke with the patient and she states that she is stressed she is trying to find an apartment.  I advised her that I can send a referral to the care guides to help with housing o She has not checked her blood pressure or weight. o She denies any chest pain shortness of breath or swelling.  Asked the patient if she will try to remember to start to check her vitals so we can know where to start to work.   Patient Self Care Activities:  . Self administers medications as prescribed . Attends all scheduled provider appointments . Calls pharmacy for medication refills . Performs ADL's independently . Performs IADL's independently . Calls provider office for new concerns or questions . Does not adhere to prescribed medication regimen  Please see past updates related to this goal by clicking on the "Past Updates" button in the selected goal          The care management team will reach out to the  patient again over the next 21 days.  The patient has been provided with contact information for the care management team and has been advised to call with any health related questions or concerns.   Lazaro Arms RN, BSN, [facility] Care Management Coordinator [place] Phone: [phone] Fax: [phone]

## 2019-07-11 ENCOUNTER — Other Ambulatory Visit: Payer: Self-pay

## 2019-07-11 ENCOUNTER — Ambulatory Visit: Payer: 59

## 2019-07-11 NOTE — Chronic Care Management (AMB) (Signed)
Care Management   Follow Up Note   07/11/2019 Name: Andrea [name] MRN: [id number] DOB: 09-Dec-1955  Referred by: [name], MD Reason for referral : Care Coordination (Care Management RNCM HTN/CHF)   Andrea [name] is a 64 y.o. year old female who is a primary care patient of [name], MD. The care management team was consulted for assistance with care management and care coordination needs.    Review of patient status, including review of consultants reports, relevant laboratory and other test results, and collaboration with appropriate care team members and the patient's provider was performed as part of comprehensive patient evaluation and provision of chronic care management services.    SDOH (Social Determinants of Health) assessments performed: No See Care Plan activities for detailed interventions related to [facility])     Advanced Directives: See Care Plan and Vyn[employer] application for related entries.   Goals Addressed            This Visit's Progress   . 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 . Non-adherence to prescribed medication regimen Patient was taking her medication at one time  Nurse Case Manager Clinical Goal(s):  [name] Over the next 90 days, patient will verbalize understanding of plan for HTN/CHF . Over the next 90 days, patient will work with [facility] to address needs related to monitoring BP and CHF   Interventions:  . Evaluation of current treatment plan related to HTN/CHF and patient's adherence to plan as established by provider. . Advised patient to check BP/Weight and record daily. Patient states that she will check her bp 1 to 2 times a week and record the  values. Encouraged the patient to drink water, monitor salt intake . Patient states that she is not checking her bp  she forgets.  She states that she is going to put up a note to remind herself to check her blood pressure and track trends . [name] Explained to her the importance of checking her pressure and weighing recording the values.  Being able to track trends . Patient is taking her medication 1 in am and 1 in pm . Reviewed medications with patient  . encouraged her to continue to take her medication bid . Discussed low sodium diet . Talked with the patient about symptoms of CHF . Discussed with the patient about weighing /she does not weigh daily . Patient will make an appointment at the beginning of the year to discuss episode of shob and being tired with Dr [name]. . Patient sent educational material : A Matter of choice Blood Pressure Control, Living better with Heart Failure   03/06/19 . Patient states that she received the educational books but has not had time to read them yet.  Encouraged her to look them over. . Patient states that she had to but a new BP cuff.  She now has   wrist cuff.  Her reading from yesterday was 143/122 p 85.  Advised her the bottom number was high.  Advise her to 1. When checking her BP have cuff at heart level. 2. Rest for 5 minutes before checking.  3. Cut down on her caffeinated drinks. . Drink more water . Her weight is 271 lbs.  Advised her to continue to weight daily and record her weight. . Monitor her sodium  intake.  She states she is eating a lot of chip.  Explained to her how the salt affects her body with the retention of water weight and affecting her blood pressure. . Reviewed medications.  She is taking her meds as prescribed.  . 03/21/19 . Patient states that she has a wrist monitor but has not been checking her BP. [name] She has a scale and has not checked her weight. . She has not reviewed the material that I had mailed to her. . She states that she is taking her medications as prescribed. . She denies any HA shob, CP . Reviewed rationale of why checking your  BP and weight is important. . 04/16/19 . Patient called me today to inform me that she went to her appointment on 2/9 . She states that her BP looked good 132/80  Her weight was up 278 lbs and she needs to work loosing weight. . Patient had an EKG perform and was notified that it was fine.  She needs to go back and have a x-ray completed. . Advised the patient to remember to check and record BP, and weights daily . Reviewed medications and how to take, discussed medications . Advised patient to read educational books that were sent to her about HTN and HF . 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  05/07/19  o Patient states that she was at [employer] trying to take her mind off things o She checked her blood pressure yesterday and it was 155/68 o She states that she has chest pain "every now and then", some swelling and shortness of breath with exertion, she feels comes with being overweight. o She has not checked her weight since she has been at the doctor's office  o She states that she is eating more vegetables, some fruits o Not much water, soda not monitoring salt and eating out a lot  o Advised the patient to drink more water, decrease a sodium, sodas, fried foods.  If she is going to eat out a lot.   I will try to find her a booklet of restaurants that will give her ideas of foods calories that are better if she is going to eat out o 05/21/19 o The patient states that her weight is 271 lbs.  o She is not checking her BP regularly last she checked it was 168/68.  We discussed about high bp pressures I asked has she read the information that I sent her.  She stated no. She did receive the information I sent her about eating out and picking food that are good for her and trying to watch the salt content. o Discussed about drinking more water o She denies any chest pain,  shob,  swelling, ha, She does have a pain in her neck from time to time but thinks that it come from the way she lays at night. o She states that she is not exercising.  She states that she is too tired.  She said she walks a lot at work. o She states that she is in the process of looking for an apartment.  Her sister wants a house and she does not. o No appointments set soon. o 06/05/19 o Patient states that she is not checking her BP like she should but she did check it one time and it was 168/72  o She has not checked her weight recently the last time it was 271 lbs. o She denies any chest pain, swelling but does have shob with exertion but feels that it comes from the weight. o She states that she is eating better and making better choices when eating out.  She received the information I sent her out picking meals when she eats out. o She is taking her medications as prescribed o We will spread her next appointment out for 3 weeks and she stated that she will have some BP readings for me then. o 06/26/19 o Spoke with the patient and she states that she is stressed she is trying to find an apartment.  I advised her that I can send a referral to the care guides to help with housing o She has not checked her blood pressure or weight. o She denies any chest pain shortness of breath or swelling.  Asked the patient if she will try to remember to start to check her vitals so we can know where to start to work. o 07/11/19 o Patient states that she is drained.  She is still trying to find a place to stay and nothing is working out for her. She is unable to find a place in her price range. Advised patient that I will send her information for relaxation techniques. Referral has been made to care guides to help with housing  o She is not checking her BP or her weight.  Explained the importance of checking her weight and BP. o She states that she has not taken her medications in a week. Explained to the patient why  it is important to take her medications on a regular basis and not skip doses. She verbalized understanding o Patient states that she will be working till June 30th and then she is retiring and she has mixed feelings about it.   Patient Self Care Activities:  . Self administers medications as prescribed . Attends all scheduled provider appointments . Calls pharmacy for medication refills . Performs ADL's independently . Performs IADL's independently . Calls provider office for new concerns or questions . Does not adhere to prescribed medication regimen  Please see past updates related to this goal by clicking on the "Past Updates" button in the selected goal          The care management team will reach out to the patient again over the next 10 days.  The patient has been provided with contact information for the care management team and has been advised to call with any health related questions or concerns.     Lazaro Arms RN, BSN, [facility] Care Management Coordinator [place] Phone: [phone] Fax: [phone]

## 2019-07-11 NOTE — Patient Instructions (Signed)
Visit Information  Goals Addressed            This Visit's Progress   . 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 . Non-adherence to prescribed medication regimen Patient was taking her medication at one time  Nurse Case Manager Clinical Goal(s):  [name] Over the next 90 days, patient will verbalize understanding of plan for HTN/CHF . Over the next 90 days, patient will work with [facility] to address needs related to monitoring BP and CHF   Interventions:  . Evaluation of current treatment plan related to HTN/CHF and patient's adherence to plan as established by provider. . Advised patient to check BP/Weight and record daily. Patient states that she will check her bp 1 to 2 times a week and record the  values. Encouraged the patient to drink water, monitor salt intake . Patient states that she is not checking her bp she forgets.  She states that she is going to put up a note to remind herself to check her blood pressure and track trends . [name] Explained to her the importance of checking her pressure and weighing recording the values.  Being able to track trends . Patient is taking her medication 1 in am and 1 in pm . Reviewed medications with patient  . encouraged her to continue to take her medication bid . Discussed low sodium diet . Talked with the patient about symptoms of CHF . Discussed with the patient about weighing /she does not weigh daily . Patient will make an appointment at the beginning of the year to discuss episode of shob and being tired with Dr [name]. . Patient sent educational material : A Matter of choice Blood Pressure Control, Living better with Heart Failure   03/06/19 . Patient states that she received the educational books but has not had time to read them yet.  Encouraged her to look them over. . Patient  states that she had to but a new BP cuff.  She now has   wrist cuff.  Her reading from yesterday was 143/122 p 85.  Advised her the bottom number was high.  Advise her to 1. When checking her BP have cuff at heart level. 2. Rest for 5 minutes before checking.  3. Cut down on her caffeinated drinks. . Drink more water . Her weight is 271 lbs.  Advised her to continue to weight daily and record her weight. . Monitor her sodium intake.  She states she is eating a lot of chip.  Explained to her how the salt affects her body with the retention of water weight and affecting her blood pressure. . Reviewed medications.  She is taking her meds as prescribed.  . 03/21/19 . Patient states that she has a wrist monitor but has not been checking her BP. [name] She has a scale and has not checked her weight. . She has not reviewed the material that I had mailed to her. . She states that she is taking her medications as prescribed. . She denies any HA shob, CP . Reviewed rationale of why checking your BP and weight is important. . 04/16/19 . Patient called me today to inform me that she went to her appointment on 2/9 . She states that her BP looked good 132/80  Her weight was up 278 lbs and she  needs to work loosing weight. . Patient had an EKG perform and was notified that it was fine.  She needs to go back and have a x-ray completed. . Advised the patient to remember to check and record BP, and weights daily . Reviewed medications and how to take, discussed medications . Advised patient to read educational books that were sent to her about HTN and HF . 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  05/07/19  o Patient states that she was at [employer] trying to take her mind off things o She checked her blood pressure yesterday and it was 155/68 o She states that she has chest pain "every now and then",  some swelling and shortness of breath with exertion, she feels comes with being overweight. o She has not checked her weight since she has been at the doctor's office  o She states that she is eating more vegetables, some fruits o Not much water, soda not monitoring salt and eating out a lot  o Advised the patient to drink more water, decrease a sodium, sodas, fried foods.  If she is going to eat out a lot.   I will try to find her a booklet of restaurants that will give her ideas of foods calories that are better if she is going to eat out o 05/21/19 o The patient states that her weight is 271 lbs.  o She is not checking her BP regularly last she checked it was 168/68.  We discussed about high bp pressures I asked has she read the information that I sent her.  She stated no. She did receive the information I sent her about eating out and picking food that are good for her and trying to watch the salt content. o Discussed about drinking more water o She denies any chest pain,  shob, swelling, ha, She does have a pain in her neck from time to time but thinks that it come from the way she lays at night. o She states that she is not exercising.  She states that she is too tired.  She said she walks a lot at work. o She states that she is in the process of looking for an apartment.  Her sister wants a house and she does not. o No appointments set soon. o 06/05/19 o Patient states that she is not checking her BP like she should but she did check it one time and it was 168/72   o She has not checked her weight recently the last time it was 271 lbs. o She denies any chest pain, swelling but does have shob with exertion but feels that it comes from the weight. o She states that she is eating better and making better choices when eating out.  She received the information I sent her out picking meals when she eats out. o She is taking her medications as prescribed o We will spread her next appointment out for  3 weeks and she stated that she will have some BP readings for me then. o 06/26/19 o Spoke with the patient and she states that she is stressed she is trying to find an apartment.  I advised her that I can send a referral to the care guides to help with housing o She has not checked her blood pressure or weight. o She denies any chest  pain shortness of breath or swelling.  Asked the patient if she will try to remember to start to check her vitals so we can know where to start to work. o 07/11/19 o Patient states that she is drained.  She is still trying to find a place to stay and nothing is working out for her. She is unable to find a place in her price range. Advised patient that I will send her information for relaxation techniques. Referral has been made to care guides to help with housing  o She is not checking her BP or her weight.  Explained the importance of checking her weight and BP. o She states that she has not taken her medications in a week. Explained to the patient why it is important to take her medications on a regular basis and not skip doses. She verbalized understanding o Patient states that she will be working till June 30th and then she is retiring and she has mixed feelings about it.   Patient Self Care Activities:  . Self administers medications as prescribed . Attends all scheduled provider appointments . Calls pharmacy for medication refills . Performs ADL's independently . Performs IADL's independently . Calls provider office for new concerns or questions . Does not adhere to prescribed medication regimen  Please see past updates related to this goal by clicking on the "Past Updates" button in the selected goal         Ms. Holsey was given information about Care Management services today including:  1. Care Management services include personalized support from designated clinical staff supervised by her physician, including individualized plan of care and  coordination with other care providers 2. 24/7 contact phone numbers for assistance for urgent and routine care needs. 3. The patient may stop CCM services at any time (effective at the end of the month) by phone call to the office staff.  Patient agreed to services and verbal consent obtained.   The patient verbalized understanding of instructions provided today and declined a print copy of patient instruction materials.   The care management team will reach out to the patient again over the next 10 days.  The patient has been provided with contact information for the care management team and has been advised to call with any health related questions or concerns.   Lazaro Arms RN, BSN, [facility] Care Management Coordinator [place] Phone: [phone] Fax: [phone]

## 2019-07-17 ENCOUNTER — Telehealth: Payer: 59

## 2019-07-17 ENCOUNTER — Other Ambulatory Visit: Payer: Self-pay

## 2019-07-18 ENCOUNTER — Telehealth: Payer: 59

## 2019-07-20 ENCOUNTER — Other Ambulatory Visit: Payer: Self-pay | Admitting: Family Medicine

## 2019-07-24 ENCOUNTER — Telehealth: Payer: 59

## 2019-07-24 ENCOUNTER — Other Ambulatory Visit: Payer: Self-pay

## 2019-07-25 ENCOUNTER — Other Ambulatory Visit: Payer: Self-pay

## 2019-07-25 ENCOUNTER — Telehealth: Payer: 59

## 2019-07-25 ENCOUNTER — Ambulatory Visit: Payer: Self-pay | Admitting: Licensed Clinical Social Worker

## 2019-07-25 DIAGNOSIS — Z139 Encounter for screening, unspecified: Secondary | ICD-10-CM

## 2019-07-25 NOTE — Chronic Care Management (AMB) (Signed)
   Social Work  Care Management Collaboration 07/25/2019 Name: Andrea [name] MRN: [id number] DOB: 1955-07-13 Andrea [name] is a 64 y.o. year old female who sees Andrea Feil, MD for primary care. LCSW was consulted by CCM RN to assistance patient with housing resources.  Recommendation: After collaboration with CCM RN care manager and reviewing chart it is determined that patient may benefit from referral to [facility] care guides.    Intervention: Patient was not interviewed or contacted during this encounter.  LCSW collaborated with CCM RN for ongoing needs. Review of patient status, including review of consultants reports, relevant laboratory and other test results, and collaboration with appropriate care team members and the patient's provider was performed as part of comprehensive patient evaluation and provision of chronic care management services.   Plan:  1. RN care manager will follow up with the patient for ongoing assessment of needs.    2. If further intervention or needs are Identified  RN care manager will contact LCSW to provide interventions.   3. Patient is being referred to the [place] for assistance with community resources for affordable housing resources.   Andrea [name], [place] / [place]   [phone] 4:17 PM

## 2019-07-29 ENCOUNTER — Other Ambulatory Visit: Payer: Self-pay

## 2019-07-29 ENCOUNTER — Ambulatory Visit: Payer: 59

## 2019-07-29 NOTE — Chronic Care Management (AMB) (Signed)
  Care Management   Outreach Note  07/29/2019 Name: Andrea [name] MRN: [id number] DOB: 04/08/1955  Referred by: [name], MD Reason for referral : Care Coordination (Care Management RNCM CHF/HTN)   An unsuccessful telephone outreach was attempted today. The patient was referred to the case management team for assistance with care management and care coordination.   Follow Up Plan: A HIPPA compliant phone message was left for the patient providing contact information and requesting a return call.  The care management team will reach out to the patient again over the next 7-14 days.   Lazaro Arms RN, BSN, [facility] Care Management Coordinator [place] Phone: [phone] Fax: [phone]

## 2019-07-31 ENCOUNTER — Telehealth: Payer: Self-pay | Admitting: Family Medicine

## 2019-07-31 NOTE — Telephone Encounter (Signed)
   [facility] 07/31/2019   Name: Andrea [name]   MRN: [id number]   DOB: 01-19-1956   AGE: 64 y.o.   GENDER: female   PCP [name], MD.   Called pt Programmer, applications Referral for senior housing. Left message for patient to give Care Guide a call back.     [place], Care Management Phone: [phone] Email: [email or web address]

## 2019-08-01 ENCOUNTER — Telehealth: Payer: Self-pay | Admitting: Family Medicine

## 2019-08-01 NOTE — Telephone Encounter (Signed)
   [facility] 08/01/2019   Name: Andrea [name]   MRN: [id number]   DOB: [birth year]   AGE: 64 y.o.   GENDER: female   PCP [name], MD.   Called pt regarding Community Resource Referral for housing assistance. Informed [name] that [place] spoke with [employer] regarding resources for housing and that she will need to give them a call at [phone] because they need to know her price range, how many bedrooms, etc.  Let patient know their office is open from 8:30 - 4:30 Monday- Friday.  Ms. Strothman stated understanding and said she will give them a call.    [place], Care Management Phone: [phone] Email: [email or web address]

## 2019-08-01 NOTE — Telephone Encounter (Signed)
[place]. Spoke with receptionist, she stated that the patient will need to give their office a call to intiate service. The will need to know the price range, how many rooms, etc. Care Guide will reach out to Andrea [name] to give her this information.

## 2019-08-01 NOTE — Telephone Encounter (Signed)
   [facility] 08/01/2019   Name: CHAMPAYNE KOCIAN   MRN: [id number]   DOB: 1955-08-02   AGE: 64 y.o.   GENDER: female   PCP [name], MD.   Received call from Ms. Higley regarding referral for assistance with housing. Patient stated she has been really stressed out trying to find a place. Currently she is living with her sister and she pays almost $700 a month. Informed patient that [employer] is now taking applications for [employer]. Patient stated she had Section 8 housing before, but she lost it and has been trying to re-apply. Ms. Caraveo stated she has been having difficulty getting in touch with someone at the [facility] office. Care Guide gave contact information for Armanda Heritage [phone]) at [facility] to discuss her situation and see if she needs to re-apply or to check the status of her application. Asked patient if she has been able to put in applications for Income Based Housing in the [place]. Patient stated that she has put in one application, but she is # 5 on the waiting list and that might take a while before she is able to get that apartment. Informed patient that Care Guide will continue to look for resources and give her a call back later today.    [place], Care Management Phone: [phone] Email: [email or web address]

## 2019-08-04 NOTE — Telephone Encounter (Signed)
   [facility] 08/04/2019   Name: Andrea [name]   MRN: [id number]   DOB: 1955/11/25   AGE: 64 y.o.   GENDER: female   PCP [name], MD.   Received call from patient regarding [employer]. Called patient back left message to inform her that she can speak receptionist and he/she will be able to direct her to the person she needs to speak with regarding her housing needs.   [place], Care Management Phone: [phone] Email: [email or web address]

## 2019-08-06 NOTE — Telephone Encounter (Signed)
   [facility] 08/06/2019   Name: Andrea [name]   MRN: [id number]   DOB: 01-28-1956   AGE: 64 y.o.   GENDER: female   PCP [name], MD.   Called pt regarding Community Resource Referral for housing. Left message to follow up with patient to see if she has been able to get in contact with the resources that were provided. Care Guide will give patient a call back in a few days.   [place], Care Management Phone: [phone] Email: [email or web address]

## 2019-08-07 ENCOUNTER — Ambulatory Visit: Payer: 59

## 2019-08-07 ENCOUNTER — Other Ambulatory Visit: Payer: Self-pay

## 2019-08-07 NOTE — Patient Instructions (Signed)
Visit Information  Goals Addressed              This Visit's Progress   .  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 . Non-adherence to prescribed medication regimen Patient was taking her medication at one time  Nurse Case Manager Clinical Goal(s):  [name] Over the next 90 days, patient will verbalize understanding of plan for HTN/CHF . Over the next 90 days, patient will work with [facility] to address needs related to monitoring BP and CHF   Interventions:  . Evaluation of current treatment plan related to HTN/CHF and patient's adherence to plan as established by provider. . Advised patient to check BP/Weight and record daily. Patient states that she will check her bp 1 to 2 times a week and record the  values. Encouraged the patient to drink water, monitor salt intake . Patient states that she is not checking her bp she forgets.  She states that she is going to put up a note to remind herself to check her blood pressure and track trends . [name] Explained to her the importance of checking her pressure and weighing recording the values.  Being able to track trends . Patient is taking her medication 1 in am and 1 in pm . Reviewed medications with patient  . encouraged her to continue to take her medication bid . Discussed low sodium diet . Talked with the patient about symptoms of CHF . Discussed with the patient about weighing /she does not weigh daily . Patient will make an appointment at the beginning of the year to discuss episode of shob and being tired with Dr [name]. . Patient sent educational material : A Matter of choice Blood Pressure Control, Living better with Heart Failure   08/07/19 . Patient states that she just found her BP monitor but has not been cheking her BP . Her scale is in storage and she is not  weighing . Shewas just getting ready to take her medications . She has not found a place to live yet but is still looking . She states that the care guides are really helping her . I will call back later in June and she states that she will have numbers for me   Patient Self Care Activities:  . Self administers medications as prescribed . Attends all scheduled provider appointments . Calls pharmacy for medication refills . Performs ADL's independently . Performs IADL's independently . Calls provider office for new concerns or questions . Does not adhere to prescribed medication regimen  Please see past updates related to this goal by clicking on the "Past Updates" button in the selected goal         Ms. Haggard was given information about Care Management services today including:  1. Care Management services include personalized support from designated clinical staff supervised by her physician, including individualized plan of care and coordination with other care providers 2. 24/7 contact phone numbers for assistance for urgent and routine care needs. 3. The patient may stop CCM services at any time (effective at the end of the month) by phone call to the office staff.  Patient agreed to services and verbal consent obtained.   The patient verbalized understanding of instructions provided today and declined a print copy of patient instruction materials.   RNCM will  follow up with the patient within the next month of  June  Shirlette Scarber RN, BSN, [facility] Care Management Coordinator [place] Phone: [phone] Fax: [phone]

## 2019-08-07 NOTE — Chronic Care Management (AMB) (Signed)
Care Management   Follow Up Note   08/07/2019 Name: Andrea [name] MRN: [id number] DOB: 1955/03/10  Referred by: [name], MD Reason for referral : Care Coordination (CAre Management RNCM HTN/CHF)   Andrea [name] is a 64 y.o. year old female who is a primary care patient of [name], MD. The care management team was consulted for assistance with care management and care coordination needs.    Review of patient status, including review of consultants reports, relevant laboratory and other test results, and collaboration with appropriate care team members and the patient's provider was performed as part of comprehensive patient evaluation and provision of chronic care management services.    SDOH (Social Determinants of Health) assessments performed: No See Care Plan activities for detailed interventions related to [facility])     Advanced Directives: See Care Plan and Vynca application for related entries.   Goals Addressed              This Visit's Progress   .  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 . Non-adherence to prescribed medication regimen Patient was taking her medication at one time  Nurse Case Manager Clinical Goal(s):  [name] Over the next 90 days, patient will verbalize understanding of plan for HTN/CHF . Over the next 90 days, patient will work with [facility] to address needs related to monitoring BP and CHF   Interventions:  . Evaluation of current treatment plan related to HTN/CHF and patient's adherence to plan as established by provider. . Advised patient to check BP/Weight and record daily. Patient states that she will check her bp 1 to 2 times a week and record the  values. Encouraged the patient to drink water, monitor salt intake . Patient states that she is not checking her  bp she forgets.  She states that she is going to put up a note to remind herself to check her blood pressure and track trends . [name] Explained to her the importance of checking her pressure and weighing recording the values.  Being able to track trends . Patient is taking her medication 1 in am and 1 in pm . Reviewed medications with patient  . encouraged her to continue to take her medication bid . Discussed low sodium diet . Talked with the patient about symptoms of CHF . Discussed with the patient about weighing /she does not weigh daily . Patient will make an appointment at the beginning of the year to discuss episode of shob and being tired with Dr [name]. . Patient sent educational material : A Matter of choice Blood Pressure Control, Living better with Heart Failure   08/07/19 . Patient states that she just found her BP monitor but has not been cheking her BP . Her scale is in storage and she is not weighing . Shewas just getting ready to take her medications . She has not found a place to live yet but is still looking . She states that the care guides are really helping her . I will call back later in June and she states that she will have numbers for me   Patient Self Care Activities:  . Self administers medications as prescribed . Attends all scheduled provider appointments . Calls pharmacy for medication refills . Performs ADL's independently . Performs IADL's independently .  Calls provider office for new concerns or questions . Does not adhere to prescribed medication regimen  Please see past updates related to this goal by clicking on the "Past Updates" button in the selected goal           RNCM will follow up with the patient within the next month of  June  Dericka Ostenson RN, BSN, [place] Phone: [phone] Fax: [phone]

## 2019-08-08 ENCOUNTER — Encounter: Payer: Self-pay | Admitting: Family Medicine

## 2019-08-08 NOTE — Telephone Encounter (Signed)
° °  SF 08/08/2019    Name: SAANVI HAKALA    MRN: [id number]    DOB: 11-10-1955    AGE: 64 y.o.    GENDER: female    PCP [name], MD.   Called patient regarding referral for housing. Left message for patient to give Care Guide a call back if she has any additional needs. Care Guide will send patient a letter along with resources for income-based and subsidized housing for the [place] area.   Closing referral pending any other needs of patient.     [place], Care Management Phone: [phone] Email: [email or web address]

## 2019-08-24 ENCOUNTER — Other Ambulatory Visit: Payer: Self-pay | Admitting: Family Medicine

## 2019-08-26 ENCOUNTER — Telehealth: Payer: 59

## 2019-08-27 ENCOUNTER — Other Ambulatory Visit: Payer: Self-pay

## 2019-08-27 ENCOUNTER — Ambulatory Visit: Payer: 59

## 2019-08-27 NOTE — Chronic Care Management (AMB) (Signed)
  Care Management   Outreach Note  08/27/2019 Name: Andrea [name] MRN: [id number] DOB: 1955/10/17  Referred by: [name], MD Reason for referral : Care Coordination (Care management RNCM CHF HTN)   An unsuccessful telephone outreach was attempted today. The patient was referred to the case management team for assistance with care management and care coordination.   Follow Up Plan: A HIPPA compliant phone message was left for the patient providing contact information and requesting a return call.  The care management team will reach out to the patient again over the next 7-14 days.   Lazaro Arms RN, BSN, [facility] Care Management Coordinator [place] Phone: [phone] Fax: [phone]

## 2019-09-03 ENCOUNTER — Ambulatory Visit: Payer: 59

## 2019-09-03 ENCOUNTER — Other Ambulatory Visit: Payer: Self-pay

## 2019-09-03 NOTE — Chronic Care Management (AMB) (Signed)
°  Care Management   Outreach Note  09/03/2019 Name: Andrea [name] MRN: [id number] DOB: Oct 25, 1955  Referred by: [name], MD Reason for referral : Care Coordination (Care Management RNCM HTN)   An unsuccessful telephone outreach was attempted today. The patient was referred to the case management team for assistance with care management and care coordination.   Follow Up Plan: A HIPPA compliant phone message was left for the patient providing contact information and requesting a return call.  The care management team will reach out to the patient again over the next 7-14 days.   Lazaro Arms RN, BSN, [facility] Care Management Coordinator [place] Phone: [phone] Fax: [phone]

## 2019-09-04 ENCOUNTER — Ambulatory Visit: Payer: Self-pay

## 2019-09-04 NOTE — Chronic Care Management (AMB) (Signed)
Care Management   Follow Up Note   09/04/2019 Name: Andrea [name] MRN: [id number] DOB: May 29, 1955  Referred by: Andrea Feil, MD Reason for referral : Care Coordination (Care Management RNCM HTN )   Andrea [name] is a 64 y.o. year old female who is a primary care patient of Andrea Feil, MD. The care management team was consulted for assistance with care management and care coordination needs.    Review of patient status, including review of consultants reports, relevant laboratory and other test results, and collaboration with appropriate care team members and the patient's provider was performed as part of comprehensive patient evaluation and provision of chronic care management services.    SDOH (Social Determinants of Health) assessments performed: No See Care Plan activities for detailed interventions related to [facility])     Advanced Directives: See Care Plan and Vynca application for related entries.   Goals Addressed              This Visit's Progress   .  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 . Non-adherence to prescribed medication regimen Patient was taking her medication at one time  Nurse Case Manager Clinical Goal(s):  [name] Over the next 90 days, patient will verbalize understanding of plan for HTN/CHF . Over the next 90 days, patient will work with [facility] to address needs related to monitoring BP and CHF   Interventions:  . Evaluation of current treatment plan related to HTN/CHF and patient's adherence to plan as established by provider. . Advised patient to check BP/Weight and record daily. Patient states that she will check her bp 1 to 2 times a week and record the  values. Encouraged the patient to drink water, monitor salt intake . Patient states that she is not checking her bp  she forgets.  She states that she is going to put up a note to remind herself to check her blood pressure and track trends . [name] Explained to her the importance of checking her pressure and weighing recording the values.  Being able to track trends . Patient is taking her medication 1 in am and 1 in pm . Reviewed medications with patient  . encouraged her to continue to take her medication bid . Discussed low sodium diet . Talked with the patient about symptoms of CHF . Discussed with the patient about weighing /she does not weigh daily . Patient will make an appointment at the beginning of the year to discuss episode of shob and being tired with Dr Andrea [name]. . Patient sent educational material : A Matter of choice Blood Pressure Control, Living better with Heart Failure   09/04/19 . Patient states that she is not checking her BP but she is taking her medications now.  Advise the patient to check her BP and write down the results . She denies any HA chest pain [place].  She does states that she stays hot all the time. . She states that she is still having a problem with her ear feeling like there is something in it.  She stated that she has been seen for it but nothing was found. Advised that she might need to see an ENT.  She verbalized understanding. . She states that she has spoken to a gentleman who has apartments that he is  updating and told her that she will be able to rent one.  She is going to follow up with him net week.   Patient Self Care Activities:  . Self administers medications as prescribed . Attends all scheduled provider appointments . Calls pharmacy for medication refills . Performs ADL's independently . Performs IADL's independently . Calls provider office for new concerns or questions . Does not adhere to prescribed medication regimen  Please see past updates related to this goal by clicking on the "Past Updates" button in the selected goal          The care  management team will reach out to the patient again over the next 14 days.  The patient has been provided with contact information for the care management team and has been advised to call with any health related questions or concerns.   Lazaro Arms RN, BSN, [facility] Care Management Coordinator [place] Phone: [phone] Fax: [phone]

## 2019-09-04 NOTE — Patient Instructions (Signed)
Visit Information  Goals Addressed              This Visit's Progress   .  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 . Non-adherence to prescribed medication regimen Patient was taking her medication at one time  Nurse Case Manager Clinical Goal(s):  [name] Over the next 90 days, patient will verbalize understanding of plan for HTN/CHF . Over the next 90 days, patient will work with [facility] to address needs related to monitoring BP and CHF   Interventions:  . Evaluation of current treatment plan related to HTN/CHF and patient's adherence to plan as established by provider. . Advised patient to check BP/Weight and record daily. Patient states that she will check her bp 1 to 2 times a week and record the  values. Encouraged the patient to drink water, monitor salt intake . Patient states that she is not checking her bp she forgets.  She states that she is going to put up a note to remind herself to check her blood pressure and track trends . [name] Explained to her the importance of checking her pressure and weighing recording the values.  Being able to track trends . Patient is taking her medication 1 in am and 1 in pm . Reviewed medications with patient  . encouraged her to continue to take her medication bid . Discussed low sodium diet . Talked with the patient about symptoms of CHF . Discussed with the patient about weighing /she does not weigh daily . Patient will make an appointment at the beginning of the year to discuss episode of shob and being tired with Dr [name]. . Patient sent educational material : A Matter of choice Blood Pressure Control, Living better with Heart Failure   09/04/19 . Patient states that she is not checking her BP but she is taking her medications now.  Advise the patient to check her BP and write down  the results . She denies any HA chest pain [place].  She does states that she stays hot all the time. . She states that she is still having a problem with her ear feeling like there is something in it.  She stated that she has been seen for it but nothing was found. Advised that she might need to see an ENT.  She verbalized understanding. . She states that she has spoken to a gentleman who has apartments that he is updating and told her that she will be able to rent one.  She is going to follow up with him net week.   Patient Self Care Activities:  . Self administers medications as prescribed . Attends all scheduled provider appointments . Calls pharmacy for medication refills . Performs ADL's independently . Performs IADL's independently . Calls provider office for new concerns or questions . Does not adhere to prescribed medication regimen  Please see past updates related to this goal by clicking on the "Past Updates" button in the selected goal         Ms. Araque was given information about Care Management services today including:  1. Care Management services include personalized support from designated clinical staff supervised by her physician, including individualized plan of care and coordination with other care providers 2. 24/7 contact phone numbers for assistance for urgent and routine care needs. 3.  The patient may stop CCM services at any time (effective at the end of the month) by phone call to the office staff.  Patient agreed to services and verbal consent obtained.   The patient verbalized understanding of instructions provided today and declined a print copy of patient instruction materials.   The care management team will reach out to the patient again over the next 14 days.  The patient has been provided with contact information for the care management team and has been advised to call with any health related questions or concerns.   Lazaro Arms RN, BSN,  [facility] Care Management Coordinator [place] Phone: [phone] Fax: [phone]

## 2019-09-08 ENCOUNTER — Telehealth: Payer: 59

## 2019-09-09 ENCOUNTER — Ambulatory Visit: Payer: 59

## 2019-09-10 ENCOUNTER — Other Ambulatory Visit: Payer: Self-pay

## 2019-09-10 ENCOUNTER — Ambulatory Visit (INDEPENDENT_AMBULATORY_CARE_PROVIDER_SITE_OTHER): Payer: 59 | Admitting: Family Medicine

## 2019-09-10 DIAGNOSIS — K219 Gastro-esophageal reflux disease without esophagitis: Secondary | ICD-10-CM

## 2019-09-10 DIAGNOSIS — H9202 Otalgia, left ear: Secondary | ICD-10-CM | POA: Diagnosis not present

## 2019-09-10 MED ORDER — FLUTICASONE PROPIONATE 50 MCG/ACT NA SUSP: 1.0000 | Freq: Every day | NASAL | 12 refills | Status: DC

## 2019-09-10 MED ORDER — FAMOTIDINE 20 MG PO TABS: 20.0000 mg | ORAL_TABLET | Freq: Every day | ORAL | 1 refills | Status: DC

## 2019-09-10 NOTE — Progress Notes (Signed)
    SUBJECTIVE:   CHIEF COMPLAINT / HPI:   Ear pain: has had chronic left sided ear pain.  Her left ear feels "stopped up".  Doesn't think hearing in left ear is worse than right ear.  Pain in her ear is sometimes worse when she eats.  Nothing else makes pain worse except drinking liquids.  nothinig makes the pain better.  Doesn't currently take any pain medication for it.    Vomiting last night: Patient was snacking on chips last night and vomited 4 times.  She usually has feels some nausea after eating.  She did not vomit today.  She denies fever.  She has not been taking the famotidine that would has been prescribed to her in the past.   PERTINENT  PMH / PSH: Dysphagia  OBJECTIVE:   BP 104/62   Pulse 89   SpO2 97%   General: Alert.  Laying on her side on the examining table initially.  No acute distress HEENT: Normal tympanic membranes bilaterally.  No tenderness of the TMJ bilaterally when pressing on the joint while patient opens and closes her mouth.  No lateral deviation when the patient opens or closes her mouth.  Moist oral mucosa, normal oropharynx CV: Regular rate and rhythm, no murmurs Pulmonary: Lungs clear to auscultation bilaterally, no wheezing. Abdomen: Nontender to palpation  ASSESSMENT/PLAN:   Esophageal reflux Had been on Zantac previously, but not on any PPI or H2 blocker recently.  Symptoms sound consistent with esophageal reflux.  Will prescribe Pepcid.  If this does not work we will consider further work-up with possible EGD.  Ear pain Seems to be consistent with eustachian tube dysfunction.  Unlikely to be otitis media based on exam and chronic nature.  Does not appear to be TMJ dysfunction based on normal exam with no tenderness of the TMJ.  Will prescribe Flonase to use daily for treatment of eustachian tube dysfunction.     Benay Pike, MD [place]

## 2019-09-10 NOTE — Patient Instructions (Signed)
I think your ear pain is caused by something called the eustachian tube defect.  This is not an infection.  Therefore we do not need to treat with antibiotics.  Sometimes it can be caused by poorly controlled acid reflux.  Therefore I would like you to take famotidine daily.  I also would like you to use Flonase nasal spray once a day.  Spray at in each nostril once every day.  Continue to do it even if your symptoms improve.  If your ear issues do not improve in a month you can always reschedule an appointment with [place] and we will consider an ENT referral.

## 2019-09-13 ENCOUNTER — Other Ambulatory Visit: Payer: Self-pay | Admitting: Family Medicine

## 2019-09-13 NOTE — Assessment & Plan Note (Signed)
Seems to be consistent with eustachian tube dysfunction.  Unlikely to be otitis media based on exam and chronic nature.  Does not appear to be TMJ dysfunction based on normal exam with no tenderness of the TMJ.  Will prescribe Flonase to use daily for treatment of eustachian tube dysfunction.

## 2019-09-13 NOTE — Assessment & Plan Note (Signed)
Had been on Zantac previously, but not on any PPI or H2 blocker recently.  Symptoms sound consistent with esophageal reflux.  Will prescribe Pepcid.  If this does not work we will consider further work-up with possible EGD.

## 2019-09-15 ENCOUNTER — Ambulatory Visit: Payer: 59

## 2019-09-15 ENCOUNTER — Other Ambulatory Visit: Payer: Self-pay

## 2019-09-15 NOTE — Chronic Care Management (AMB) (Signed)
  Care Management   Outreach Note  09/15/2019 Name: Andrea [name] MRN: [id number] DOB: 1955-07-18  Referred by: [name], MD Reason for referral : Chronic Care Management (CHF HTN)   A second unsuccessful telephone outreach was attempted today. The patient was referred to the case management team for assistance with care management and care coordination.   Follow Up Plan: A HIPPA compliant phone message was left for the patient providing contact information and requesting a return call.  The care management team will reach out to the patient again over the next 7-14 days.   Lazaro Arms RN, BSN, [facility] Care Management Coordinator [place] Phone: [phone] Fax: [phone]

## 2019-09-16 ENCOUNTER — Other Ambulatory Visit: Payer: Self-pay | Admitting: Family Medicine

## 2019-09-16 DIAGNOSIS — Z1231 Encounter for screening mammogram for malignant neoplasm of breast: Secondary | ICD-10-CM

## 2019-09-23 ENCOUNTER — Other Ambulatory Visit: Payer: Self-pay

## 2019-09-23 ENCOUNTER — Ambulatory Visit: Payer: 59

## 2019-09-23 NOTE — Patient Instructions (Signed)
Visit Information  Goals Addressed              This Visit's Progress   .  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 . Non-adherence to prescribed medication regimen Patient was taking her medication at one time  Nurse Case Manager Clinical Goal(s):  [name] Over the next 90 days, patient will verbalize understanding of plan for HTN/CHF . Over the next 90 days, patient will work with [facility] to address needs related to monitoring BP and CHF   Interventions:  . Evaluation of current treatment plan related to HTN/CHF and patient's adherence to plan as established by provider. . Advised patient to check BP/Weight and record daily. Patient states that she will check her bp 1 to 2 times a week and record the  values. Encouraged the patient to drink water, monitor salt intake . Patient states that she is not checking her bp she forgets.  She states that she is going to put up a note to remind herself to check her blood pressure and track trends . [name] Explained to her the importance of checking her pressure and weighing recording the values.  Being able to track trends . Patient is taking her medication 1 in am and 1 in pm . Reviewed medications with patient  . encouraged her to continue to take her medication bid . Discussed low sodium diet . Talked with the patient about symptoms of CHF . Discussed with the patient about weighing /she does not weigh daily . Patient will make an appointment at the beginning of the year to discuss episode of shob and being tired with Dr [name]. . Patient sent educational material : A Matter of choice Blood Pressure Control, Living better with Heart Failure  09/23/19 . Patient states that she is not checking her BP but she is taking her medications regularly.   . She denies any HA chest pain [place]. She  states that her Monitor is in the house in a box and she will try to find it.  She does sound in better spirits today.  She is out with her friend.  Patient Self Care Activities:  . Self administers medications as prescribed . Attends all scheduled provider appointments . Calls pharmacy for medication refills . Performs ADL's independently . Performs IADL's independently . Calls provider office for new concerns or questions . Does not adhere to prescribed medication regimen  Please see past updates related to this goal by clicking on the "Past Updates" button in the selected goal         Andrea [name] was given information about Care Management services today including:  1. Care Management services include personalized support from designated clinical staff supervised by her physician, including individualized plan of care and coordination with other care providers 2. 24/7 contact phone numbers for assistance for urgent and routine care needs. 3. The patient may stop CCM services at any time (effective at the end of the month) by phone call to the office staff.  Patient agreed to services and verbal consent obtained.   The patient verbalized understanding of instructions provided today and declined a print copy of patient instruction materials.   The care management team will reach out to the patient again over the next 21 days.   Lazaro Arms RN, BSN, [facility]  Care Management Coordinator [place] Phone: [phone] Fax: [phone]

## 2019-09-23 NOTE — Chronic Care Management (AMB) (Signed)
Care Management   Follow Up Note   09/23/2019 Name: Andrea [name] MRN: [id number] DOB: 10/29/1955  Referred by: [name], MD Reason for referral : Appointment (HTN)   Andrea [name] is a 64 y.o. year old female who is a primary care patient of [name], MD. The care management team was consulted for assistance with care management and care coordination needs.    Review of patient status, including review of consultants reports, relevant laboratory and other test results, and collaboration with appropriate care team members and the patient's provider was performed as part of comprehensive patient evaluation and provision of chronic care management services.    SDOH (Social Determinants of Health) assessments performed: No See Care Plan activities for detailed interventions related to [facility])     Advanced Directives: See Care Plan and Vynca application for related entries.   Goals Addressed              This Visit's Progress   .  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 . Non-adherence to prescribed medication regimen Patient was taking her medication at one time  Nurse Case Manager Clinical Goal(s):  [name] Over the next 90 days, patient will verbalize understanding of plan for HTN/CHF . Over the next 90 days, patient will work with [facility] to address needs related to monitoring BP and CHF   Interventions:  . Evaluation of current treatment plan related to HTN/CHF and patient's adherence to plan as established by provider. . Advised patient to check BP/Weight and record daily. Patient states that she will check her bp 1 to 2 times a week and record the  values. Encouraged the patient to drink water, monitor salt intake . Patient states that she is not checking her bp she forgets.  She states  that she is going to put up a note to remind herself to check her blood pressure and track trends . [name] Explained to her the importance of checking her pressure and weighing recording the values.  Being able to track trends . Patient is taking her medication 1 in am and 1 in pm . Reviewed medications with patient  . encouraged her to continue to take her medication bid . Discussed low sodium diet . Talked with the patient about symptoms of CHF . Discussed with the patient about weighing /she does not weigh daily . Patient will make an appointment at the beginning of the year to discuss episode of shob and being tired with Dr [name]. . Patient sent educational material : A Matter of choice Blood Pressure Control, Living better with Heart Failure  09/23/19 . Patient states that she is not checking her BP but she is taking her medications regularly.   . She denies any HA chest pain [place]. She states that her Monitor is in the house in a box and she will try to find it.  She does sound in better spirits today.  She is out with her friend.  Patient Self Care Activities:  . Self administers medications as prescribed . Attends all scheduled provider appointments . Calls pharmacy for medication refills . Performs ADL's independently . Performs IADL's independently . Calls provider office for new concerns or questions . Does not adhere to prescribed medication regimen  Please see past updates related to this  goal by clicking on the "Past Updates" button in the selected goal          The care management team will reach out to the patient again over the next 21 days.   Lazaro Arms RN, BSN, [facility] Care Management Coordinator [place] Phone: [phone] Fax: [phone]

## 2019-09-24 ENCOUNTER — Ambulatory Visit
Admission: RE | Admit: 2019-09-24 | Discharge: 2019-09-24 | Disposition: A | Discharge status: Home / Self Care | Payer: 59 | Source: Ambulatory Visit | Attending: Family Medicine | Admitting: Family Medicine

## 2019-09-24 DIAGNOSIS — Z1231 Encounter for screening mammogram for malignant neoplasm of breast: Secondary | ICD-10-CM

## 2019-10-14 ENCOUNTER — Ambulatory Visit: Payer: 59

## 2019-10-14 NOTE — Chronic Care Management (AMB) (Signed)
Care Management   Follow Up Note   10/14/2019 Name: Andrea [name] MRN: [id number] DOB: [birth year]  Referred by: [name], MD Reason for referral : Chronic Care Management (HTN)   Andrea [name] is a 64 y.o. year old female who is a primary care patient of [name], MD. The care management team was consulted for assistance with care management and care coordination needs.    Review of patient status, including review of consultants reports, relevant laboratory and other test results, and collaboration with appropriate care team members and the patient's provider was performed as part of comprehensive patient evaluation and provision of chronic care management services.    SDOH (Social Determinants of Health) assessments performed: No See Care Plan activities for detailed interventions related to [facility])     Advanced Directives: See Care Plan and Vynca application for related entries.   Goals Addressed              This Visit's Progress   .  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 . Non-adherence to prescribed medication regimen Patient was taking her medication at one time  Nurse Case Manager Clinical Goal(s):  [name] Over the next 90 days, patient will verbalize understanding of plan for HTN/CHF . Over the next 90 days, patient will work with [facility] to address needs related to monitoring BP and CHF   Interventions:  . Evaluation of current treatment plan related to HTN/CHF and patient's adherence to plan as established by provider. . Advised patient to check BP/Weight and record daily. Patient states that she will check her bp 1 to 2 times a week and record the  values. Encouraged the patient to drink water, monitor salt intake . Patient states that she is not checking her bp she forgets.   She states that she is going to put up a note to remind herself to check her blood pressure and track trends . [name] Explained to her the importance of checking her pressure and weighing recording the values.  Being able to track trends . Patient is taking her medication 1 in am and 1 in pm . Reviewed medications with patient  . encouraged her to continue to take her medication bid . Discussed low sodium diet . Talked with the patient about symptoms of CHF . Discussed with the patient about weighing /she does not weigh daily . Patient will make an appointment at the beginning of the year to discuss episode of shob and being tired with Dr [name]. . Patient sent educational material : A Matter of choice Blood Pressure Control, Living better with Heart Failure  10/14/19 . Spoke with the patient and she is still not checking her BP.  She has not found her monitor. She is taking her medications as prescribed.  I explained to her that being in the program I need to know what her values are to monitor her progress.  If she would like to continue to be in the program I would need to know that she wants to participate. She said that she would like to continue the program but would need to find her monitor.  She stated that she will give me a call.  Patient Self Care Activities:  . Self administers medications as prescribed . Attends all scheduled provider appointments .  Calls pharmacy for medication refills . Performs ADL's independently . Performs IADL's independently . Calls provider office for new concerns or questions . Does not adhere to prescribed medication regimen  Please see past updates related to this goal by clicking on the "Past Updates" button in the selected goal         RNCM discussed with the patient the next steps and she would like for me to wait for her to call.  Lazaro Arms RN, BSN, [facility] Care Management Coordinator [place] Phone: [phone] Fax:  [phone]

## 2019-10-14 NOTE — Patient Instructions (Signed)
Visit Information  Goals Addressed              This Visit's Progress   .  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 . Non-adherence to prescribed medication regimen Patient was taking her medication at one time  Nurse Case Manager Clinical Goal(s):  [name] Over the next 90 days, patient will verbalize understanding of plan for HTN/CHF . Over the next 90 days, patient will work with [facility] to address needs related to monitoring BP and CHF   Interventions:  . Evaluation of current treatment plan related to HTN/CHF and patient's adherence to plan as established by provider. . Advised patient to check BP/Weight and record daily. Patient states that she will check her bp 1 to 2 times a week and record the  values. Encouraged the patient to drink water, monitor salt intake . Patient states that she is not checking her bp she forgets.  She states that she is going to put up a note to remind herself to check her blood pressure and track trends . [name] Explained to her the importance of checking her pressure and weighing recording the values.  Being able to track trends . Patient is taking her medication 1 in am and 1 in pm . Reviewed medications with patient  . encouraged her to continue to take her medication bid . Discussed low sodium diet . Talked with the patient about symptoms of CHF . Discussed with the patient about weighing /she does not weigh daily . Patient will make an appointment at the beginning of the year to discuss episode of shob and being tired with Dr [name]. . Patient sent educational material : A Matter of choice Blood Pressure Control, Living better with Heart Failure  10/14/19 . Spoke with the patient and she is still not checking her BP.  She has not found her monitor. She is taking her medications as prescribed.  I  explained to her that being in the program I need to know what her values are to monitor her progress.  If she would like to continue to be in the program I would need to know that she wants to participate. She said that she would like to continue the program but would need to find her monitor.  She stated that she will give me a call.  Patient Self Care Activities:  . Self administers medications as prescribed . Attends all scheduled provider appointments . Calls pharmacy for medication refills . Performs ADL's independently . Performs IADL's independently . Calls provider office for new concerns or questions . Does not adhere to prescribed medication regimen  Please see past updates related to this goal by clicking on the "Past Updates" button in the selected goal         The patient verbalized understanding of instructions provided today and declined a print copy of patient instruction materials.    RNCM discussed with the patient the next steps and she would like for me to wait for her to call.  Lazaro Arms RN, BSN, [facility] Care Management Coordinator [place] Phone: [phone] Fax: [phone]

## 2019-10-16 ENCOUNTER — Ambulatory Visit: Payer: 59

## 2019-10-16 NOTE — Chronic Care Management (AMB) (Signed)
Care Management   Follow Up Note   10/16/2019 Name: Andrea [name] MRN: [id number] DOB: July 29, 1955  Referred by: [name], MD Reason for referral : Chronic Care Management (HTN)   Andrea [name] is a 64 y.o. year old female who is a primary care patient of [name], MD. The care management team was consulted for assistance with care management and care coordination needs.    Review of patient status, including review of consultants reports, relevant laboratory and other test results, and collaboration with appropriate care team members and the patient's provider was performed as part of comprehensive patient evaluation and provision of chronic care management services.    SDOH (Social Determinants of Health) assessments performed: No See Care Plan activities for detailed interventions related to [facility])     Advanced Directives: See Care Plan and Vynca application for related entries.   Goals Addressed              This Visit's Progress   .  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 . Non-adherence to prescribed medication regimen Patient was taking her medication at one time  Nurse Case Manager Clinical Goal(s):  [name] Over the next 90 days, patient will verbalize understanding of plan for HTN/CHF . Over the next 90 days, patient will work with [facility] to address needs related to monitoring BP and CHF   Interventions:  . Evaluation of current treatment plan related to HTN/CHF and patient's adherence to plan as established by provider. . Advised patient to check BP/Weight and record daily. Patient states that she will check her bp 1 to 2 times a week and record the  values. Encouraged the patient to drink water, monitor salt intake . Patient states that she is not checking her bp she forgets.   She states that she is going to put up a note to remind herself to check her blood pressure and track trends . [name] Explained to her the importance of checking her pressure and weighing recording the values.  Being able to track trends . Patient is taking her medication 1 in am and 1 in pm . Reviewed medications with patient  . encouraged her to continue to take her medication bid . Discussed low sodium diet . Talked with the patient about symptoms of CHF . Discussed with the patient about weighing /she does not weigh daily . Patient will make an appointment at the beginning of the year to discuss episode of shob and being tired with Dr [name]. . Patient sent educational material : A Matter of choice Blood Pressure Control, Living better with Heart Failure  10/16/19 . Spoke with the patient and she she found her monitor.  She checked her BP today and it was 164/58. [name] She is taking her meds and monitoring her diet.  She states that she is still eating out a lot.  She is trying to eat more vegetables. . She will be moving into her apartment at the end of September. . Our next steps will be to start slow  check her BP 1- 2 rimes a week cut back on sodas and have the numbers ready for the next call.   Patient Self Care Activities:  . Self administers medications as prescribed . Attends all scheduled provider appointments . Calls pharmacy  for medication refills . Performs ADL's independently . Performs IADL's independently . Calls provider office for new concerns or questions . Does not adhere to prescribed medication regimen  Please see past updates related to this goal by clicking on the "Past Updates" button in the selected goal          The care management team will reach out to the patient again over the next 21 days.   Lazaro Arms RN, BSN, [facility] Care Management Coordinator [place] Phone: [phone] Fax: [phone]

## 2019-10-16 NOTE — Patient Instructions (Signed)
Visit Information  Goals Addressed              This Visit's Progress   .  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 . Non-adherence to prescribed medication regimen Patient was taking her medication at one time  Nurse Case Manager Clinical Goal(s):  [name] Over the next 90 days, patient will verbalize understanding of plan for HTN/CHF . Over the next 90 days, patient will work with [facility] to address needs related to monitoring BP and CHF   Interventions:  . Evaluation of current treatment plan related to HTN/CHF and patient's adherence to plan as established by provider. . Advised patient to check BP/Weight and record daily. Patient states that she will check her bp 1 to 2 times a week and record the  values. Encouraged the patient to drink water, monitor salt intake . Patient states that she is not checking her bp she forgets.  She states that she is going to put up a note to remind herself to check her blood pressure and track trends . [name] Explained to her the importance of checking her pressure and weighing recording the values.  Being able to track trends . Patient is taking her medication 1 in am and 1 in pm . Reviewed medications with patient  . encouraged her to continue to take her medication bid . Discussed low sodium diet . Talked with the patient about symptoms of CHF . Discussed with the patient about weighing /she does not weigh daily . Patient will make an appointment at the beginning of the year to discuss episode of shob and being tired with Dr [name]. . Patient sent educational material : A Matter of choice Blood Pressure Control, Living better with Heart Failure  10/16/19 . Spoke with the patient and she she found her monitor.  She checked her BP today and it was 164/58. [name] She is taking her meds and monitoring  her diet.  She states that she is still eating out a lot.  She is trying to eat more vegetables. . She will be moving into her apartment at the end of September. . Our next steps will be to start slow  check her BP 1- 2 rimes a week cut back on sodas and have the numbers ready for the next call.   Patient Self Care Activities:  . Self administers medications as prescribed . Attends all scheduled provider appointments . Calls pharmacy for medication refills . Performs ADL's independently . Performs IADL's independently . Calls provider office for new concerns or questions . Does not adhere to prescribed medication regimen  Please see past updates related to this goal by clicking on the "Past Updates" button in the selected goal         The patient verbalized understanding of instructions provided today and declined a print copy of patient instruction materials.   The care management team will reach out to the patient again over the next 21 days.   Lazaro Arms RN, BSN, [facility] Care Management Coordinator [place] Phone: [phone] Fax: [phone]

## 2019-10-27 ENCOUNTER — Encounter: Payer: Self-pay | Admitting: Family Medicine

## 2019-10-28 ENCOUNTER — Ambulatory Visit (INDEPENDENT_AMBULATORY_CARE_PROVIDER_SITE_OTHER): Payer: 59 | Admitting: Family Medicine

## 2019-10-28 ENCOUNTER — Encounter: Payer: Self-pay | Admitting: Family Medicine

## 2019-10-28 ENCOUNTER — Other Ambulatory Visit: Payer: Self-pay

## 2019-10-28 VITALS — BP 128/78 | HR 74 | Ht 63.0 in | Wt 278.0 lb

## 2019-10-28 DIAGNOSIS — Z1211 Encounter for screening for malignant neoplasm of colon: Secondary | ICD-10-CM

## 2019-10-28 DIAGNOSIS — E039 Hypothyroidism, unspecified: Secondary | ICD-10-CM

## 2019-10-28 DIAGNOSIS — E559 Vitamin D deficiency, unspecified: Secondary | ICD-10-CM | POA: Diagnosis not present

## 2019-10-28 DIAGNOSIS — I10 Essential (primary) hypertension: Secondary | ICD-10-CM

## 2019-10-28 DIAGNOSIS — E1169 Type 2 diabetes mellitus with other specified complication: Secondary | ICD-10-CM | POA: Diagnosis not present

## 2019-10-28 DIAGNOSIS — Z23 Encounter for immunization: Secondary | ICD-10-CM

## 2019-10-28 DIAGNOSIS — F331 Major depressive disorder, recurrent, moderate: Secondary | ICD-10-CM

## 2019-10-28 DIAGNOSIS — E785 Hyperlipidemia, unspecified: Secondary | ICD-10-CM

## 2019-10-28 NOTE — Patient Instructions (Signed)
Colorectal Cancer Screening  Colorectal cancer screening is a group of tests that are used to check for colorectal cancer before symptoms develop. Colorectal refers to the colon and rectum. The colon and rectum are located at the end of the digestive tract and carry bowel movements out of the body. Who should have screening? All adults starting at age 63 until age 87 should have screening. Your health care provider may recommend screening at age 71. You will have tests every 1-10 years, depending on your results and the type of screening test. You may have screening tests starting at an earlier age, or more frequently than other people, if you have any of the following risk factors:  A personal or family history of colorectal cancer or abnormal growths (polyps).  Inflammatory bowel disease, such as ulcerative colitis or Crohn's disease.  A history of having radiation treatment to the abdomen or pelvic area for cancer.  Colorectal cancer symptoms, such as changes in bowel habits or blood in your stool.  A type of colon cancer syndrome that is passed from parent to child (hereditary), such as: ? Lynch syndrome. ? Familial adenomatous polyposis. ? Turcot syndrome. ? Peutz-Jeghers syndrome. Screening recommendations for adults who are 72-48 years old vary depending on health. How is screening done? There are several types of colorectal screening tests. You may have one or more of the following:  Guaiac-based fecal occult blood testing. For this test, a stool (feces) sample is checked for hidden (occult) blood, which could be a sign of colorectal cancer.  Fecal immunochemical test (FIT). For this test, a stool sample is checked for blood, which could be a sign of colorectal cancer.  Stool DNA test. For this test, a stool sample is checked for blood and changes in DNA that could lead to colorectal cancer.  Sigmoidoscopy. During this test, a thin, flexible tube with a camera on the end  (sigmoidoscope) is used to examine the rectum and the lower colon.  Colonoscopy. During this test, a long, flexible tube with a camera on the end (colonoscope) is used to examine the entire colon and rectum. With a colonoscopy, it is possible to take a sample of tissue (biopsy) and remove small polyps during the test.  Virtual colonoscopy. Instead of a colonoscope, this type of colonoscopy uses X-rays (CT scan) and computers to produce images of the colon and rectum. What are the benefits of screening? Screening reduces your risk for colorectal cancer and can help identify cancer at an early stage, when the cancer can be removed or treated more easily. It is common for polyps to form in the lining of the colon, especially as you age. These polyps may be cancerous or become cancerous over time. Screening can identify these polyps. What are the risks of screening? Each screening test may have different risks.  Stool sample tests have fewer risks than other types of screening tests. However, you may need more tests to confirm results from a stool sample test.  Screening tests that involve X-rays expose you to low levels of radiation, which may slightly increase your cancer risk. The benefit of detecting cancer outweighs the slight increase in risk.  Screening tests such as sigmoidoscopy and colonoscopy may place you at risk for bleeding, intestinal damage, infection, or a reaction to medicines given during the exam. Talk with your health care provider to understand your risk for colorectal cancer and to make a screening plan that is right for you. Questions to ask your health care  provider  When should I start colorectal cancer screening?  What is my risk for colorectal cancer?  How often do I need screening?  Which screening tests do I need?  How do I get my test results?  What do my results mean? Where to find more information Learn more about colorectal cancer screening from:  The  [place]: www.cancer.org  The [employer]: www.cancer.gov Summary  Colorectal cancer screening is a group of tests used to check for colorectal cancer before symptoms develop.  Screening reduces your risk for colorectal cancer and can help identify cancer at an early stage, when the cancer can be removed or treated more easily.  All adults starting at age 3 until age 72 should have screening. Your health care provider may recommend screening at age 2.  You may have screening tests starting at an earlier age, or more frequently than other people, if you have certain risk factors.  Talk with your health care provider to understand your risk for colorectal cancer and to make a screening plan that is right for you. This information is not intended to replace advice given to you by your health care provider. Make sure you discuss any questions you have with your health care provider. Document Revised: 06/05/2018 Document Reviewed: 11/15/2016 Elsevier Patient Education  [place].

## 2019-10-28 NOTE — Assessment & Plan Note (Signed)
Counseling done. She will continue to work on diet and exercise. She is otherwise doing great with diet control.

## 2019-10-28 NOTE — Assessment & Plan Note (Signed)
Improved from baseline. Monitor closely on current regimen. Still does not want CBT.

## 2019-10-28 NOTE — Assessment & Plan Note (Signed)
FLP checked today. Continue Statin at current dose.

## 2019-10-28 NOTE — Assessment & Plan Note (Signed)
BP looks good. Cmet checked today.

## 2019-10-28 NOTE — Assessment & Plan Note (Signed)
TSH checked. I will call her with the result.

## 2019-10-28 NOTE — Progress Notes (Signed)
    SUBJECTIVE:   CHIEF COMPLAINT / HPI:   HTN/HLD: Here for f/u. No SOB, no chest pain. Feels well in general. She is compliant with Coreg 6.25 mg BID, Simvastatin 40 mg qd, Maxzide 37.5/25 mg QD.   Hypothyroidism: She is compliant with Synthroid 137 mcg qd. No new concerns.  MDD:She is compliant with her Zoloft 150 mg qd. She endorses doing much better. No new concerns.  Weight:She has improved on her diet. She does not get exercise.   PERTINENT  PMH / PSH: PMX reviewed  OBJECTIVE:   BP 128/78   Pulse 74   Ht 5\' 3"  (1.6 m)   Wt 278 lb (126.1 kg)   SpO2 95%   BMI 49.25 kg/m   Physical Exam Vitals and nursing note reviewed.  Constitutional:      Appearance: Normal appearance.  Cardiovascular:     Rate and Rhythm: Normal rate and regular rhythm.     Heart sounds: Normal heart sounds. No murmur heard.   Pulmonary:     Effort: Pulmonary effort is normal. No respiratory distress.     Breath sounds: Normal breath sounds. No wheezing or rhonchi.  Abdominal:     General: Abdomen is flat. Bowel sounds are normal.     Palpations: Abdomen is soft. There is no mass.     Tenderness: There is no abdominal tenderness.  Musculoskeletal:     Right lower leg: No edema.     Left lower leg: No edema.  Neurological:     Mental Status: She is alert and oriented to person, place, and time.  Psychiatric:        Mood and Affect: Mood normal.        Behavior: Behavior normal.        Thought Content: Thought content normal.        Judgment: Judgment normal.      Office Visit from 10/28/2019 in [place]  PHQ-9 Total Score 13       ASSESSMENT/PLAN:   HYPERTENSION, BENIGN SYSTEMIC BP looks good. Cmet checked today.  Hyperlipidemia FLP checked today. Continue Statin at current dose.  Hypothyroidism TSH checked. I will call her with the result.  Major depressive disorder, recurrent episode ([place]) Improved from baseline. Monitor closely on current  regimen. Still does not want CBT.  Morbid obesity ([place]) Counseling done. She will continue to work on diet and exercise. She is otherwise doing great with diet control.   Note: She got COVID-19 and flu shot today. She was monitored for 15 minutes with no symptoms. Colonoscopy vs cologuard discussed. She opted for cologuard. Test ordered.  [name], MD [place]

## 2019-10-29 ENCOUNTER — Telehealth: Payer: Self-pay | Admitting: Family Medicine

## 2019-10-29 LAB — CMP14+EGFR
ALT: 28 IU/L (ref 0–32)
AST: 42 IU/L — ABNORMAL HIGH (ref 0–40)
Albumin/Globulin Ratio: 1.3 (ref 1.2–2.2)
Albumin: 4.1 g/dL (ref 3.8–4.8)
Alkaline Phosphatase: 91 IU/L (ref 48–121)
BUN/Creatinine Ratio: 20 (ref 12–28)
BUN: 18 mg/dL (ref 8–27)
Bilirubin Total: 0.3 mg/dL (ref 0.0–1.2)
CO2: 27 mmol/L (ref 20–29)
Calcium: 9.3 mg/dL (ref 8.7–10.3)
Chloride: 97 mmol/L (ref 96–106)
Creatinine, Ser: 0.92 mg/dL (ref 0.57–1.00)
GFR calc Af Amer: 77 mL/min/{1.73_m2} (ref >59)
GFR calc non Af Amer: 66 mL/min/{1.73_m2} (ref >59)
Globulin, Total: 3.2 g/dL (ref 1.5–4.5)
Glucose: 106 mg/dL — ABNORMAL HIGH (ref 65–99)
Potassium: 3.9 mmol/L (ref 3.5–5.2)
Sodium: 137 mmol/L (ref 134–144)
Total Protein: 7.3 g/dL (ref 6.0–8.5)

## 2019-10-29 LAB — TSH: TSH: 0.582 u[IU]/mL (ref 0.450–4.500)

## 2019-10-29 LAB — LIPID PANEL
Chol/HDL Ratio: 4.4 ratio (ref 0.0–4.4)
Cholesterol, Total: 171 mg/dL (ref 100–199)
HDL: 39 mg/dL — ABNORMAL LOW (ref >39)
LDL Chol Calc (NIH): 112 mg/dL — ABNORMAL HIGH (ref 0–99)
Triglycerides: 110 mg/dL (ref 0–149)
VLDL Cholesterol Cal: 20 mg/dL (ref 5–40)

## 2019-10-29 LAB — VITAMIN D 25 HYDROXY (VIT D DEFICIENCY, FRACTURES): Vit D, 25-Hydroxy: 39.6 ng/mL (ref 30.0–100.0)

## 2019-10-29 MED ORDER — LEVOTHYROXINE SODIUM 137 MCG PO TABS: ORAL_TABLET | ORAL | 1 refills | Status: DC

## 2019-10-29 NOTE — Telephone Encounter (Signed)
Test results discussed with her. FLP improved from a year ago. TSH and Vitamin D levels are normal.  Continue OTC vitamin D supplement.  I refilled her Synthroid 137 mcg at same dose.

## 2019-11-06 ENCOUNTER — Ambulatory Visit: Payer: 59

## 2019-11-06 NOTE — Chronic Care Management (AMB) (Signed)
Care Management   Follow Up Note   11/06/2019 Name: Andrea [name] MRN: [id number] DOB: 24-May-1955  Referred by: [name], MD Reason for referral : Chronic Care Management (HTN)   Andrea [name] is a 64 y.o. year old female who is a primary care patient of [name], MD. The care management team was consulted for assistance with care management and care coordination needs.    Review of patient status, including review of consultants reports, relevant laboratory and other test results, and collaboration with appropriate care team members and the patient's provider was performed as part of comprehensive patient evaluation and provision of chronic care management services.    SDOH (Social Determinants of Health) assessments performed: No See Care Plan activities for detailed interventions related to [facility])     Advanced Directives: See Care Plan and Vynca application for related entries.   Goals Addressed              This Visit's Progress   .  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 . Non-adherence to prescribed medication regimen Patient was taking her medication at one time  Nurse Case Manager Clinical Goal(s):  [name] Over the next 90 days, patient will verbalize understanding of plan for HTN/CHF . Over the next 90 days, patient will work with [facility] to address needs related to monitoring BP and CHF   Interventions:  . Evaluation of current treatment plan related to HTN/CHF and patient's adherence to plan as established by provider. . Advised patient to check BP/Weight and record daily. Patient states that she will check her bp 1 to 2 times a week and record the  values. Encouraged the patient to drink water, monitor salt intake . Patient states that she is not checking her bp she forgets.  She  states that she is going to put up a note to remind herself to check her blood pressure and track trends . [name] Explained to her the importance of checking her pressure and weighing recording the values.  Being able to track trends . Patient is taking her medication 1 in am and 1 in pm . Reviewed medications with patient  . encouraged her to continue to take her medication bid . Discussed low sodium diet . Talked with the patient about symptoms of CHF . Discussed with the patient about weighing /she does not weigh daily . Patient will make an appointment at the beginning of the year to discuss episode of shob and being tired with Dr [name]. . Patient sent educational material : A Matter of choice Blood Pressure Control, Living better with Heart Failure  . Spoke with the patient and she she found her monitor.  She has been checking her BP regularly.  In the office on 8/31 it was 128/78.  She state on 9/2 it was 160/73 and 9/4 it was 158/75.  She uses a wrist cuff and not sure how accurate it is.   . I congratulated her on starting to check regularly writing the values down and taking her medications.   . She has taken her 1st covid shot and flu shot and will get her 2nd covid shot on the 28th of the month.  Patient Self Care Activities:  . Self administers medications as prescribed . Attends all scheduled provider appointments .  Calls pharmacy for medication refills . Performs ADL's independently . Performs IADL's independently . Calls provider office for new concerns or questions . Does not adhere to prescribed medication regimen  Please see past updates related to this goal by clicking on the "Past Updates" button in the selected goal          The care management team will reach out to the patient again over the next 14 days.   Lazaro Arms RN, BSN, [facility] Care Management Coordinator [place] Phone: [phone] Fax: [phone]

## 2019-11-06 NOTE — Patient Instructions (Signed)
Visit Information  Goals Addressed              This Visit's Progress     "I will not remember to take my medication twice daily for  my blood pressure" (pt-stated)        Current Barriers:   Knowledge Deficits related to long term management of  cardiovascular diease (HTN/CHF).  Non-adherence to prescribed medication regimen Patient was taking her medication at one time  Nurse Case Manager Clinical Goal(s):   Over the next 90 days, patient will verbalize understanding of plan for HTN/CHF  Over the next 90 days, patient will work with [facility] to address needs related to monitoring BP and CHF   Interventions:   Evaluation of current treatment plan related to HTN/CHF and patient's adherence to plan as established by provider.  Advised patient to check BP/Weight and record daily. Patient states that she will check her bp 1 to 2 times a week and record the  values. Encouraged the patient to drink water, monitor salt intake  Patient states that she is not checking her bp she forgets.  She states that she is going to put up a note to remind herself to check her blood pressure and track trends .  Explained to her the importance of checking her pressure and weighing recording the values.  Being able to track trends  Patient is taking her medication 1 in am and 1 in pm  Reviewed medications with patient   encouraged her to continue to take her medication bid  Discussed low sodium diet  Talked with the patient about symptoms of CHF  Discussed with the patient about weighing /she does not weigh daily  Patient will make an appointment at the beginning of the year to discuss episode of shob and being tired with Dr [name].  Patient sent educational material : A Matter of choice Blood Pressure Control, Living better with Heart Failure   Spoke with the patient and she she found her monitor.  She has been checking her BP regularly.  In the office on 8/31 it was 128/78.  She state on  9/2 it was 160/73 and 9/4 it was 158/75.  She uses a wrist cuff and not sure how accurate it is.    I congratulated her on starting to check regularly writing the values down and taking her medications.    She has taken her 1st covid shot and flu shot and will get her 2nd covid shot on the 28th of the month.  Patient Self Care Activities:   Self administers medications as prescribed  Attends all scheduled provider appointments  Calls pharmacy for medication refills  Performs ADL's independently  Performs IADL's independently  Calls provider office for new concerns or questions  Does not adhere to prescribed medication regimen  Please see past updates related to this goal by clicking on the "Past Updates" button in the selected goal         Andrea [name] was given information about Care Management services today including:  1. Care Management services include personalized support from designated clinical staff supervised by her physician, including individualized plan of care and coordination with other care providers 2. 24/7 contact phone numbers for assistance for urgent and routine care needs. 3. The patient may stop CCM services at any time (effective at the end of the month) by phone call to the office staff.  Patient agreed to services and verbal consent obtained.   The patient verbalized understanding of  instructions provided today and declined a print copy of patient instruction materials.   The care management team will reach out to the patient again over the next 14 days.      Lazaro Arms RN, BSN, [facility] Care Management Coordinator [place] Phone: [phone] Fax: [phone]

## 2019-11-14 ENCOUNTER — Other Ambulatory Visit: Payer: Self-pay | Admitting: Family Medicine

## 2019-11-14 NOTE — Telephone Encounter (Signed)
Received call from patient regarding referral placed back in June 2021 for housing assistance. Patient stated that she has found a place, but she needs assistance with the deposit and first months rent and also assistance with moving. Gave patient two resources that might be able to assist her which are [employer] and [employer]. Informed patient that if they cannot assist her, they might be able to give her a resource that can assist with paying the first months rent and deposit. Patient stated understanding. Also explained to patient that Care Guide was not able to find any affordable moving services. Asked patient if she has a family member or friend that might be able to assist her with moving. Patient stated that she has a family member, but she would prefer to have a moving company. Informed patient that if Care Guide happens to find a reasonably priced moving company she will receive a call back to let her know. Patient stated understanding.

## 2019-11-20 ENCOUNTER — Ambulatory Visit: Payer: 59

## 2019-11-20 NOTE — Patient Instructions (Signed)
Visit Information  Goals Addressed              This Visit's Progress   .  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 . Non-adherence to prescribed medication regimen Patient was taking her medication at one time  Nurse Case Manager Clinical Goal(s):  Andrea [name] Over the next 90 days, patient will verbalize understanding of plan for HTN/CHF . Over the next 90 days, patient will work with [facility] to address needs related to monitoring BP and CHF   Interventions:  . Evaluation of current treatment plan related to HTN/CHF and patient's adherence to plan as established by provider. . Advised patient to check BP/Weight and record daily. Patient states that she will check her bp 1 to 2 times a week and record the  values. Encouraged the patient to drink water, monitor salt intake .  Explained to her the importance of checking her pressure and weighing recording the values.  Being able to track trends . Patient is taking her medication 1 in am and 1 in pm . Reviewed medications with patient  . encouraged her to continue to take her medication bid . Discussed low sodium diet . Talked with the patient about symptoms of CHF . Discussed with the patient about weighing /she does not weigh daily . Patient sent educational material : A Matter of choice Blood Pressure Control, Living better with Heart Failure  . Spoke with the patient today and she states that she is a little frustrated with the housing situation.   Andrea [name] states that she continues to take her medications as prescribed.  She is checking her blood pressures with her wrist cuff but has a arm cuff in storage that she will get once she moves.   . Her readings have been 167/63, 161/61, and 151/53.  We discussed the signs and symptoms of HTN.  She is not weighing.  . Discussed about diet  and sodium. Patient verbalized understanding   Patient Self Care Activities:  . Self administers medications as prescribed . Attends all scheduled provider appointments . Calls pharmacy for medication refills . Performs ADL's independently . Performs IADL's independently . Calls provider office for new concerns or questions . Does not adhere to prescribed medication regimen  Please see past updates related to this goal by clicking on the "Past Updates" button in the selected goal         Andrea [name] was given information about Care Management services today including:  1. Care Management services include personalized support from designated clinical staff supervised by her physician, including individualized plan of care and coordination with other care providers 2. 24/7 contact phone numbers for assistance for urgent and routine care needs. 3. The patient may stop CCM services at any time (effective at the end of the month) by phone call to the office staff.  Patient agreed to services and verbal consent obtained.   The patient verbalized understanding of instructions provided today and declined a print copy of patient instruction materials.   The care management team will reach out to the patient again over the next 21 days.   Lazaro Arms RN, BSN, [facility] Care Management Coordinator [place] Phone: [phone] Fax: [phone]

## 2019-11-20 NOTE — Chronic Care Management (AMB) (Signed)
Care Management   Follow Up Note   11/20/2019 Name: Andrea [name] MRN: [id number] DOB: 1955/04/07  Referred by: [name], MD Reason for referral : Appointment (HTN/CHF)   Andrea [name] is a 64 y.o. year old female who is a primary care patient of [name], MD. The care management team was consulted for assistance with care management and care coordination needs.    Review of patient status, including review of consultants reports, relevant laboratory and other test results, and collaboration with appropriate care team members and the patient's provider was performed as part of comprehensive patient evaluation and provision of chronic care management services.    SDOH (Social Determinants of Health) assessments performed: No See Care Plan activities for detailed interventions related to [facility])     Advanced Directives: See Care Plan and Vynca application for related entries.   Goals Addressed              This Visit's Progress   .  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 . Non-adherence to prescribed medication regimen Patient was taking her medication at one time  Nurse Case Manager Clinical Goal(s):  [name] Over the next 90 days, patient will verbalize understanding of plan for HTN/CHF . Over the next 90 days, patient will work with [facility] to address needs related to monitoring BP and CHF   Interventions:  . Evaluation of current treatment plan related to HTN/CHF and patient's adherence to plan as established by provider. . Advised patient to check BP/Weight and record daily. Patient states that she will check her bp 1 to 2 times a week and record the  values. Encouraged the patient to drink water, monitor salt intake .  Explained to her the importance of checking her pressure and weighing  recording the values.  Being able to track trends . Patient is taking her medication 1 in am and 1 in pm . Reviewed medications with patient  . encouraged her to continue to take her medication bid . Discussed low sodium diet . Talked with the patient about symptoms of CHF . Discussed with the patient about weighing /she does not weigh daily . Patient sent educational material : A Matter of choice Blood Pressure Control, Living better with Heart Failure  . Spoke with the patient today and she states that she is a little frustrated with the housing situation.   [name] states that she continues to take her medications as prescribed.  She is checking her blood pressures with her wrist cuff but has a arm cuff in storage that she will get once she moves.   . Her readings have been 167/63, 161/61, and 151/53.  We discussed the signs and symptoms of HTN.  She is not weighing.  . Discussed about diet and sodium. Patient verbalized understanding   Patient Self Care Activities:  . Self administers medications as prescribed . Attends all scheduled provider appointments . Calls pharmacy for medication refills . Performs ADL's independently . Performs IADL's independently . Calls provider office for new concerns or questions . Does not adhere to prescribed medication regimen  Please see past updates related to this goal by clicking on the "Past Updates" button in the selected goal          The care management team will  reach out to the patient again over the next 21 days.   Lazaro Arms RN, BSN, [facility] Care Management Coordinator [place] Phone: [phone] Fax: [phone]

## 2019-11-25 ENCOUNTER — Ambulatory Visit (INDEPENDENT_AMBULATORY_CARE_PROVIDER_SITE_OTHER): Payer: 59

## 2019-11-25 ENCOUNTER — Other Ambulatory Visit: Payer: Self-pay

## 2019-11-25 DIAGNOSIS — Z23 Encounter for immunization: Secondary | ICD-10-CM | POA: Diagnosis not present

## 2019-11-25 NOTE — Progress Notes (Signed)
   Covid-19 Vaccination Clinic  Name:  Andrea [name]    MRN: [id number] DOB: [birth year]  11/25/2019   Patient presents to nurse clinic for second [facility] vaccination. Patient denies previous allergic reaction and answers no to all screening questions. Administered in LD, site unremarkable, patient tolerated injection well.   Ms. Archambeau was observed post Covid-19 immunization for 15 minutes without incident. She was provided with Vaccine Information Sheet and instruction to access the V-Safe system.   Ms. Kleinsasser was instructed to call 911 with any severe reactions post vaccine: [name] Difficulty breathing  . Swelling of face and throat  . A fast heartbeat  . A bad rash all over body  . Dizziness and weakness    Provided patient with updated immunization record and card.   Talbot Grumbling, RN

## 2019-12-11 ENCOUNTER — Ambulatory Visit: Payer: 59

## 2019-12-11 NOTE — Patient Instructions (Signed)
Visit Information  Goals Addressed              This Visit's Progress   .  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 . Non-adherence to prescribed medication regimen Patient was taking her medication at one time  Nurse Case Manager Clinical Goal(s):  Andrea [name] Over the next 90 days, patient will verbalize understanding of plan for HTN/CHF . Over the next 90 days, patient will work with [facility] to address needs related to monitoring BP and CHF   Interventions:  . Evaluation of current treatment plan related to HTN/CHF and patient's adherence to plan as established by provider. . Advised patient to check BP/Weight and record daily. Patient said that she is not checking her weight because the scale is in storage. She is checking her BP 1-2 times a week.  Her BP yesterday 169/72. . She said she is getting some exercise because she has to walk to catch the bus. . She is taking her meds as prescribed . We discussed the signs and symptoms of HTN.   Andrea [name] Discussed about diet and sodium. Patient verbalized understanding  . Patient states that she sometimes has a pain in her neck that will travel behind her ear that last for 2-3 seconds maybe a minute.  It happened last week and then a couple of months ago.  I advised her at her next doctor visit have if examined. . Patient states she has had both covid shot and flu shot.  Patient Self Care Activities:  . Self administers medications as prescribed . Attends all scheduled provider appointments . Calls pharmacy for medication refills . Performs ADL's independently . Performs IADL's independently . Calls provider office for new concerns or questions . Does not adhere to prescribed medication regimen  Please see past updates related to this goal by clicking on the "Past Updates" button in the  selected goal         [name] was given information about Care Management services today including:  1. Care Management services include personalized support from designated clinical staff supervised by her physician, including individualized plan of care and coordination with other care providers 2. 24/7 contact phone numbers for assistance for urgent and routine care needs. 3. The patient may stop CCM services at any time (effective at the end of the month) by phone call to the office staff.  Patient agreed to services and verbal consent obtained.   The patient verbalized understanding of instructions provided today and declined a print copy of patient instruction materials.   The care management team will reach out to the patient again over the next 21 days.   Lazaro Arms RN, BSN, [facility] Care Management Coordinator [place] Phone: [phone] Fax: [phone]

## 2019-12-11 NOTE — Chronic Care Management (AMB) (Signed)
  Care Management   Follow Up Note   12/11/2019 Name: Andrea [name] MRN: [id number] DOB: 1955-09-01  Referred by: [name], MD Reason for referral : Chronic Care Management (HTN)   Andrea [name] is a 64 y.o. year old female who is a primary care patient of [name], MD. The care management team was consulted for assistance with care management and care coordination needs.    Review of patient status, including review of consultants reports, relevant laboratory and other test results, and collaboration with appropriate care team members and the patient's provider was performed as part of comprehensive patient evaluation and provision of chronic care management services.    SDOH (Social Determinants of Health) assessments performed: No See Care Plan activities for detailed interventions related to [facility])     Advanced Directives: See Care Plan and Vynca application for related entries.   Goals Addressed              This Visit's Progress   .  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 . Non-adherence to prescribed medication regimen Patient was taking her medication at one time  Nurse Case Manager Clinical Goal(s):  [name] Over the next 90 days, patient will verbalize understanding of plan for HTN/CHF . Over the next 90 days, patient will work with [facility] to address needs related to monitoring BP and CHF   Interventions:  . Evaluation of current treatment plan related to HTN/CHF and patient's adherence to plan as established by provider. . Advised patient to check BP/Weight and record daily. Patient said that she is not checking her weight because the scale is in storage. She is checking her BP 1-2 times a week.  Her BP yesterday 169/72. . She said she is getting some exercise because she has to  walk to catch the bus. . She is taking her meds as prescribed . We discussed the signs and symptoms of HTN.   [name] Discussed about diet and sodium. Patient verbalized understanding  . Patient states that she sometimes has a pain in her neck that will travel behind her ear that last for 2-3 seconds maybe a minute.  It happened last week and then a couple of months ago.  I advised her at her next doctor visit have if examined. . Patient states she has had both covid shot and flu shot.  Patient Self Care Activities:  . Self administers medications as prescribed . Attends all scheduled provider appointments . Calls pharmacy for medication refills . Performs ADL's independently . Performs IADL's independently . Calls provider office for new concerns or questions . Does not adhere to prescribed medication regimen  Please see past updates related to this goal by clicking on the "Past Updates" button in the selected goal          The care management team will reach out to the patient again over the next 21 days.   Lazaro Arms RN, BSN, [facility] Care Management Coordinator [place] Phone: [phone] Fax: [phone]

## 2020-01-01 ENCOUNTER — Telehealth: Payer: 59

## 2020-01-01 ENCOUNTER — Other Ambulatory Visit: Payer: Self-pay

## 2020-01-01 MED ORDER — LORATADINE 10 MG PO TABS: 10.0000 mg | ORAL_TABLET | Freq: Every day | ORAL | 2 refills | Status: DC

## 2020-01-02 ENCOUNTER — Other Ambulatory Visit: Payer: Self-pay | Admitting: Family Medicine

## 2020-01-02 ENCOUNTER — Telehealth: Payer: Self-pay

## 2020-01-02 NOTE — Telephone Encounter (Signed)
  Care Management   Outreach Note  01/02/2020 Late Entry  Name: Andrea [name] MRN: [id number] DOB: [birth year]  Referred by: [name], MD Reason for referral : Appointment (HTN CHF )   An unsuccessful telephone outreach was attempted today. The patient was referred to the case management team for assistance with care management and care coordination.   Follow Up Plan: A HIPAA compliant phone message was left for the patient providing contact information and requesting a return call.  The care management team will reach out to the patient again over the next 7-14 days.   Lazaro Arms RN, BSN, [facility] Care Management Coordinator [place] Phone: [phone] Fax: [phone]

## 2020-01-05 NOTE — Telephone Encounter (Signed)
Called spoke with patient rescheduled follow up call for 01/12/2020  Andrea [name], [place] [address] Direct Dial: [email or web address]

## 2020-01-07 ENCOUNTER — Telehealth: Payer: Self-pay | Admitting: Family Medicine

## 2020-01-07 NOTE — Telephone Encounter (Signed)
Clinical info completed on Health Review form.  Place form in  PCP's box for completion.  Salvatore Marvel, CMA

## 2020-01-07 NOTE — Telephone Encounter (Signed)
Health Review form dropped off for at front desk for completion.  Verified that patient section of form has been completed.  Last DOS/WCC with PCP was 10/28/19  Placed form in team folder to be completed by clinical staff.  Grayce Corie Chiquito

## 2020-01-08 NOTE — Telephone Encounter (Signed)
Appt scheduled for 01/30/20.  Matty Deamer,CMA

## 2020-01-08 NOTE — Telephone Encounter (Signed)
Patient need annual physical for form to be completed.  Please help schedule wellness visit with me or RN. I will hold on to her form pending her appointment. If she want to pick it up without appointment, she can do so too.

## 2020-01-12 ENCOUNTER — Ambulatory Visit: Payer: 59

## 2020-01-12 NOTE — Patient Instructions (Signed)
Visit Information  Goals Addressed              This Visit's Progress   .  Monitor My BP (pt-stated)        Patient Goals/Self Care Activities:  . Self administers medications as prescribed . Attends all scheduled provider appointments . Calls provider office for new concerns, questions, or BP outside discussed parameters . Checks BP and records as discussed . Follows a low sodium diet/DASH diet . Find her arm BP monitor and scale and start to record here values . Attend appointment on 01/30/20 for yearly check up. . agree to work together to make changes . ask questions to understand . check blood pressure 3 times per week       Ms. Convery was given information about Care Management services today including:  1. Care Management services include personalized support from designated clinical staff supervised by her physician, including individualized plan of care and coordination with other care providers 2. 24/7 contact phone numbers for assistance for urgent and routine care needs. 3. The patient may stop CCM services at any time (effective at the end of the month) by phone call to the office staff.  Patient agreed to services and verbal consent obtained.   The patient verbalized understanding of instructions, educational materials, and care plan provided today and declined offer to receive copy of patient instructions, educational materials, and care plan.   The care management team will reach out to the patient again over the next 21 days.   Lazaro Arms RN, BSN, [facility] Care Management Coordinator [place] Phone: [phone] Fax: [phone]

## 2020-01-12 NOTE — Chronic Care Management (AMB) (Signed)
   RN  Care Management   Follow Up Note   01/12/2020 Name: Andrea [name] MRN: [id number] DOB: [birth year]  Reason for referral : Chronic Care Management (CHF HTN)   Andrea [name] is a 64 y.o. year old Andrea who is a primary care patient of [name], MD. The care management team was consulted for assistance with care management and care coordination needs.      Assessment: CAlled to follow up with the patient about her HTN.    Patient Care Plan: RN Case Management  Problem Identified: Hypertension Management   Long-Range Goal: Hypertension Monitored   Note:    Current Barriers:  [name] Knowledge Deficits related to basic understanding of hypertension pathophysiology and self care management  Nurse Case Manager Clinical Goal(s):   [name] Over the next 90 days, patient will demonstrate improved adherence to prescribed treatment plan for hypertension as evidenced by taking all medications as prescribed, monitoring and recording blood pressure as directed, adhering to low sodium/DASH diet  Interventions:  . Evaluation of current treatment plan related to hypertension self management and patient's adherence to plan as established by provider. . Provided education to patient re: stroke prevention, s/s of heart attack and stroke, DASH diet, complications of uncontrolled blood pressure . Reviewed medications with patient and discussed importance of compliance . Discussed plans with patient for ongoing care management follow up and provided patient with direct contact information for care management team . Advised patient, providing education and rationale, to monitor blood pressure daily and record, calling PCP for findings outside established parameters.  . Reviewed scheduled/upcoming provider appointments including: 01-30-20 for yearly check up per patient . healthy diet promoted . reduction of dietary sodium encouraged-Patient states that she has  never used salt in her food.   She does eat out a lot and I advised her to make sure that she request foods that are low in salt.  . medication adherence assessment completed-patient states that she is taking her medications as prescribed  . Reviewed the signs and symptoms of HTN and the patient denies any. . support and encouragement provided  Patient Goals/Self Care Activities:  . Self administers medications as prescribed . Attends all scheduled provider appointments . Calls provider office for new concerns, questions, or BP outside discussed parameters . Checks BP and records as discussed . Follows a low sodium diet/DASH diet . Find her arm BP monitor and scale and start to record here values . Attend appointment on 01/30/20 for yearly check up. . agree to work together to make changes . ask questions to understand . check blood pressure 3 times per week  Follow up Plan: The care management team will reach out to the patient again over the next 21 days.   Lazaro Arms RN, BSN, [facility] Care Management Coordinator [place] Phone: [phone] Fax: [phone]       Review of patient status, including review of consultants reports, relevant laboratory and other test results, and collaboration with appropriate care team members and the patient's provider was performed as part of comprehensive patient evaluation and provision of chronic care management services.    SDOH (Social Determinants of Health) assessments performed: No See Care Plan activities for detailed interventions related to SDOH)        Andrews, BSN, [place] Management Coordinator [place] Phone: [phone] Fax: [phone]

## 2020-01-29 ENCOUNTER — Encounter: Payer: Self-pay | Admitting: Family Medicine

## 2020-01-29 ENCOUNTER — Telehealth: Payer: 59

## 2020-01-29 ENCOUNTER — Telehealth: Payer: Self-pay

## 2020-01-29 NOTE — Telephone Encounter (Signed)
°  Care Management     Care Management Outreach Note  01/29/2020 Name: Andrea [name] MRN: [id number] DOB: January 23, 1956  [name] is a 64 y.o. year old female who is a primary care patient of [name], [name], MD .[place] received a cal from  [employer] today by phone she stated that she had received my message but she was not at home and could not talk today she would need to reschedule.  Follow Up Plan: RNCM will reschedule call for within 14 days  Lazaro Arms RN, BSN, [place] Phone: [phone] I Fax: [phone]

## 2020-01-30 ENCOUNTER — Other Ambulatory Visit: Payer: Self-pay

## 2020-01-30 ENCOUNTER — Telehealth: Payer: Self-pay | Admitting: Family Medicine

## 2020-01-30 ENCOUNTER — Ambulatory Visit: Payer: 59 | Admitting: Family Medicine

## 2020-01-30 ENCOUNTER — Encounter: Payer: Self-pay | Admitting: Family Medicine

## 2020-01-30 VITALS — BP 130/80 | HR 94 | Ht 63.0 in | Wt 272.8 lb

## 2020-01-30 DIAGNOSIS — Z0000 Encounter for general adult medical examination without abnormal findings: Secondary | ICD-10-CM

## 2020-01-30 NOTE — Telephone Encounter (Signed)
[facility] physical exam form completed and was placed in the RN's box for faxing.  Note that the form does not have fax number on it.   Please reach out to her for fax number. Thanks.

## 2020-01-30 NOTE — Progress Notes (Signed)
Subjective:     Andrea [name] is a 64 y.o. female and is here for a comprehensive physical exam. The patient reports no problems. I received a physical exam form from [facility] care to be completed by me.    Social History   Socioeconomic History  . Marital status: Single    Spouse name: Not on file  . Number of children: Not on file  . Years of education: Not on file  . Highest education level: Not on file  Occupational History  . Not on file  Tobacco Use  . Smoking status: Never Smoker  . Smokeless tobacco: Never Used  Vaping Use  . Vaping Use: Never used  Substance and Sexual Activity  . Alcohol use: No  . Drug use: No  . Sexual activity: Never    Birth control/protection: Surgical  Other Topics Concern  . 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 Difficulty of Paying Living Expenses: Not on file  Food Insecurity:   . Worried About Charity fundraiser in the Last Year: Not on file  . Ran Out of Food in the Last Year: Not on file  Transportation Needs:   . Lack of Transportation (Medical): Not on file  . Lack of Transportation (Non-Medical): Not on file  Physical Activity:   . Days of Exercise per Week: Not on file  . Minutes of Exercise per Session: Not on file  Stress:   . Feeling of Stress : Not on file  Social Connections:   . Frequency of Communication with Friends and Family: Not on file  . Frequency of Social Gatherings with Friends and Family: Not on file  . Attends Religious Services: Not on file  . Active Member of Clubs or Organizations: Not on file  . Attends Archivist Meetings: Not on file  . Marital Status: Not on file  Intimate Partner Violence:   . Fear of Current or Ex-Partner: Not on file  . Emotionally Abused: Not on file  . Physically Abused: Not on file  . Sexually  Abused: Not on file   Health Maintenance  Topic Date Due  . COLONOSCOPY  09/03/2018  . MAMMOGRAM  09/23/2021  . TETANUS/TDAP  07/14/2026  . INFLUENZA VACCINE  Completed  . COVID-19 Vaccine  Completed  . Hepatitis C Screening  Completed  . HIV Screening  Completed    The following portions of the patient's history were reviewed and updated as appropriate: allergies, current medications, past family history, past medical history, past social history, past surgical history and problem list.  Review of Systems Pertinent items noted in HPI and remainder of comprehensive ROS otherwise negative.   Objective:    Wt 272 lb 12.8 oz (123.7 kg)   BMI 48.32 kg/m  General appearance: alert, cooperative and appears stated age Head: Normocephalic, without obvious abnormality, atraumatic Eyes: conjunctivae/corneas clear. PERRL, EOM's intact. Fundi benign. Ears: normal TM's and external ear canals both ears Throat: lips, mucosa, and tongue normal; teeth and gums normal Neck: no adenopathy, no carotid bruit, no JVD, supple, symmetrical, trachea midline and thyroid not enlarged, symmetric, no tenderness/mass/nodules Lungs: clear to auscultation bilaterally Heart: regular rate and rhythm, S1, S2 normal, no murmur, click, rub or gallop Abdomen: soft, non-tender; bowel sounds normal; no masses,  no organomegaly Extremities: extremities normal, atraumatic, no  cyanosis or edema Skin: Skin color, texture, turgor normal. No rashes or lesions Lymph nodes: Cervical, supraclavicular, and axillary nodes normal. Neurologic: Alert and oriented X 3, normal strength and tone. Normal symmetric reflexes. Normal coordination and gait    Assessment:    Healthy female exam.      Plan:    She is up to date with her flu shot and COVID-19 shot. Mammogram report reviewed and is normal She has her cologuard kit at home for cancer screening and will send in kit soon. [place] physical exam form completed.   Patient requested that the copy be [facility] directly to [facility]. I will return form to RN to process.  See After Visit Summary for Counseling Recommendations

## 2020-01-30 NOTE — Patient Instructions (Signed)
Colonoscopy, Adult, Care After This sheet gives you information about how to care for yourself after your procedure. Your doctor may also give you more specific instructions. If you have problems or questions, call your doctor. What can I expect after the procedure? After the procedure, it is common to have:  A small amount of blood in your poop (stool) for 24 hours.  Some gas.  Mild cramping or bloating in your belly (abdomen). Follow these instructions at home: Eating and drinking   Drink enough fluid to keep your pee (urine) pale yellow.  Follow instructions from your doctor about what you cannot eat or drink.  Return to your normal diet as told by your doctor. Avoid heavy or fried foods that are hard to digest. Activity  Rest as told by your doctor.  Do not sit for a long time without moving. Get up to take short walks every 1-2 hours. This is important. Ask for help if you feel weak or unsteady.  Return to your normal activities as told by your doctor. Ask your doctor what activities are safe for you. To help cramping and bloating:   Try walking around.  Put heat on your belly as told by your doctor. Use the heat source that your doctor recommends, such as a moist heat pack or a heating pad. ? Put a towel between your skin and the heat source. ? Leave the heat on for 20-30 minutes. ? Remove the heat if your skin turns bright red. This is very important if you are unable to feel pain, heat, or cold. You may have a greater risk of getting burned. General instructions  For the first 24 hours after the procedure: ? Do not drive or use machinery. ? Do not sign important documents. ? Do not drink alcohol. ? Do your daily activities more slowly than normal. ? Eat foods that are soft and easy to digest.  Take over-the-counter or prescription medicines only as told by your doctor.  Keep all follow-up visits as told by your doctor. This is important. Contact a doctor  if:  You have blood in your poop 2-3 days after the procedure. Get help right away if:  You have more than a small amount of blood in your poop.  You see large clumps of tissue (blood clots) in your poop.  Your belly is swollen.  You feel like you may vomit (nauseous).  You vomit.  You have a fever.  You have belly pain that gets worse, and medicine does not help your pain. Summary  After the procedure, it is common to have a small amount of blood in your poop. You may also have mild cramping and bloating in your belly.  For the first 24 hours after the procedure, do not drive or use machinery, do not sign important documents, and do not drink alcohol.  Get help right away if you have a lot of blood in your poop, feel like you may vomit, have a fever, or have more belly pain. This information is not intended to replace advice given to you by your health care provider. Make sure you discuss any questions you have with your health care provider. Document Revised: 09/09/2018 Document Reviewed: 09/09/2018 Elsevier Patient Education  2020 Elsevier Inc.  

## 2020-02-02 NOTE — Telephone Encounter (Signed)
Called patient at provided number, no answer, left VM for patient to return call to office to clarify directions for completed form.   Talbot Grumbling, RN

## 2020-02-03 LAB — COLOGUARD: Cologuard: NEGATIVE

## 2020-02-13 ENCOUNTER — Telehealth: Payer: 59

## 2020-02-13 ENCOUNTER — Encounter: Payer: Self-pay | Admitting: Family Medicine

## 2020-02-13 ENCOUNTER — Telehealth: Payer: Self-pay

## 2020-02-13 NOTE — Telephone Encounter (Signed)
  Care Management   Outreach Note  02/13/2020 Name: JAQUETTA CURRIER MRN: [id number] DOB: 1955-03-21  Referred by: [name], MD Reason for referral : Chronic Care Management (HTN)   Nanci A Snowberger is enrolled in a Managed Medicaid Health Plan: No  An unsuccessful telephone outreach was attempted today. The patient was referred to the case management team for assistance with care management and care coordination.   Follow Up Plan: A HIPAA compliant phone message was left for the patient providing contact information and requesting a return call.  The care management team will reach out to the patient again over the next 7-14 days.   Lazaro Arms RN, BSN, [facility] Care Management Coordinator [place] Phone: [phone] I Fax: [phone]

## 2020-02-16 NOTE — Telephone Encounter (Signed)
Spoke with patient rescheduled for 02/23/2020. She ask I let you know she was having trouble with her phone and apologized for missing your call.  [place] Management  Direct Dial: [phone]

## 2020-02-23 ENCOUNTER — Ambulatory Visit: Payer: 59

## 2020-02-23 NOTE — Patient Instructions (Signed)
Andrea [name]  it was nice speaking with you. Please call me directly [phone] if you have questions about the goals we discussed.  Goals Addressed              This Visit's Progress   .  Monitor My BP (pt-stated)        Patient Goals/Self Care Activities:  . Self administers medications as prescribed . Attends all scheduled provider appointments . Calls provider office for new concerns, questions, or BP outside discussed parameters . Checks BP and records as discussed . Follows a low sodium diet/DASH diet . Find her arm BP monitor and scale and start to record here values . agree to work together to make changes . ask questions to understand . check blood pressure 3 times per week       Patient Care Plan: RN Case Management  Problem Identified: Hypertension Management   Long-Range Goal: Hypertension Monitored   Start Date: 01/06/2019  Expected End Date: 05/24/2020  Priority: Medium  Note:    Current Barriers:  [name] Knowledge Deficits related to basic understanding of hypertension pathophysiology and self care management  Nurse Case Manager Clinical Goal(s):   [name] Over the next 90 days, patient will demonstrate improved adherence to prescribed treatment plan for hypertension as evidenced by taking all medications as prescribed, monitoring and recording blood pressure as directed, adhering to low sodium/DASH diet  Interventions:  . Evaluation of current treatment plan related to hypertension self management and patient's adherence to plan as established by provider. . Provided education to patient re: stroke prevention, s/s of heart attack and stroke, DASH diet, complications of uncontrolled blood pressure . Reviewed medications with patient and discussed importance of compliance . Discussed plans with patient for ongoing care management follow up and provided patient with direct contact information for care management team . Advised patient, providing education and rationale,  to monitor blood pressure daily and record, calling PCP for findings outside established parameters. - her BP today was 166/67 using her arm BP machine and her weight was 266 lbs. . Reviewed scheduled/upcoming provider appointments including:  . healthy diet promoted . reduction of dietary sodium encouraged-Patient continues to monitor her food intake.  She is trying to slowing move away from fried food and do baked. . medication adherence assessment completed-patient is taking her medications as prescribed  . Reviewed the signs and symptoms of HTN and the patient denies any. . support and encouragement provided- to continue to check and record values, exercise, and take medications . Patient does not sleep completely through the night due to she is taking naps during the day.  She is trying to stay active so she does not fall asleep.  She is walking more to get to  public transportation. She is drinking water but admits that she needs to drink more.  Patient Goals/Self Care Activities:  . Self administers medications as prescribed . Attends all scheduled provider appointments . Calls provider office for new concerns, questions, or BP outside discussed parameters . Checks BP and records as discussed . Follows a low sodium diet/DASH diet . Find her arm BP monitor and scale and start to record here values . agree to work together to make changes . ask questions to understand . check blood pressure 3 times per week  Follow up Plan: The care management team will reach out to the patient again over the next 21 days.   Juanell Fairly RN, BSN, [facility] Care Management Coordinator [facility] Phone: [phone] Fax: [phone]       Ms. Sear received Care Management services today:  1. Care Management services include personalized support from designated clinical staff supervised by her physician, including individualized plan of care and coordination with other care  providers 2. 24/7 contact [phone] for assistance for urgent and routine care needs. 3. Care Management are voluntary services and be declined at any time by calling the office.  The patient verbalized understanding of instructions provided today and declined a print copy of patient instruction materials.    Juanell Fairly, RN

## 2020-02-23 NOTE — Chronic Care Management (AMB) (Signed)
Care Management   RN Case Manager Follow UpNote  02/23/2020 Name: Andrea [name] MRN: [id number] DOB: Aug 31, 1955 Andrea [name] is a 64 y.o. year old female who sees [name], MD for primary care.  Patient is enrolled in a Managed Medicaid plan: No.  The Care Management team was consulted by PCP to assist the patient with Disease Management and Educational Needs.   RNCM engaged with Andrea [name] today by telephone in response to provider referral for RN case management and/or care coordination services. See care plan below for details during this encounter.  Follow up Plan: The care management team will reach out to the patient again over the next 21 days.    Advanced Directives Status:Not addressed in this encounter.     SDOH (Social Determinants of Health) assessments performed: No     Patient Care Plan: RN Case Management  Problem Identified: Hypertension Management   Long-Range Goal: Hypertension Monitored   Start Date: 01/06/2019  Expected End Date: 05/24/2020  Priority: Medium  Note:    Current Barriers:  [name] Knowledge Deficits related to basic understanding of hypertension pathophysiology and self care management  Nurse Case Manager Clinical Goal(s):   [name] Over the next 90 days, patient will demonstrate improved adherence to prescribed treatment plan for hypertension as evidenced by taking all medications as prescribed, monitoring and recording blood pressure as directed, adhering to low sodium/DASH diet  Interventions:  . Evaluation of current treatment plan related to hypertension self management and patient's adherence to plan as established by provider. . Provided education to patient re: stroke prevention, s/s of heart attack and stroke, DASH diet, complications of uncontrolled blood pressure . Reviewed medications with patient and discussed importance of compliance . Discussed plans with patient for ongoing care management follow up and provided  patient with direct contact information for care management team . Advised patient, providing education and rationale, to monitor blood pressure daily and record, calling PCP for findings outside established parameters. - her BP today was 166/67 using her arm BP machine and her weight was 266 lbs. . Reviewed scheduled/upcoming provider appointments including:  . healthy diet promoted . reduction of dietary sodium encouraged-Patient continues to monitor her food intake.  She is trying to slowing move away from fried food and do baked. . medication adherence assessment completed-patient is taking her medications as prescribed  . Reviewed the signs and symptoms of HTN and the patient denies any. . support and encouragement provided- to continue to check and record values, exercise, and take medications . Patient does not sleep completely through the night due to she is taking naps during the day.  She is trying to stay active so she does not fall asleep.  She is walking more to get to  public transportation. She is drinking water but admits that she needs to drink more.  Patient Goals/Self Care Activities:  . Self administers medications as prescribed . Attends all scheduled provider appointments . Calls provider office for new concerns, questions, or BP outside discussed parameters . Checks BP and records as discussed . Follows a low sodium diet/DASH diet . Find her arm BP monitor and scale and start to record here values . agree to work together to make changes . ask questions to understand . check blood pressure 3 times per week  Lazaro Arms RN, BSN, [place] Phone: [phone] Fax: [phone]       Outpatient Encounter Medications as of 02/23/2020  Medication Sig  . albuterol (VENTOLIN HFA) 108 (90 Base) MCG/ACT inhaler Inhale 2 puffs into the lungs every 6 (six) hours as needed for wheezing or shortness of breath.  [name]  aspirin 81 MG chewable tablet Chew 81 mg by mouth daily.  . Blood Pressure Monitoring (BLOOD PRESSURE MONITOR/L CUFF) MISC Check blood pressure 3 times per week.  . carvedilol (COREG) 6.25 MG tablet TAKE 1 TABLET BY MOUTH TWICE DAILY WITH A MEAL  . famotidine (PEPCID) 20 MG tablet Take 1 tablet (20 mg total) by mouth daily.  . fluticasone (FLONASE) 50 MCG/ACT nasal spray Place 1 spray into both nostrils daily. 1 spray in each nostril every day  . levothyroxine (SYNTHROID) 137 MCG tablet TAKE 1 TABLET BY MOUTH ONCE DAILY BEFORE BREAKFAST  . loratadine (CLARITIN) 10 MG tablet Take 1 tablet (10 mg total) by mouth daily.  . sertraline (ZOLOFT) 50 MG tablet Take 3 tablets by mouth once daily  . simvastatin (ZOCOR) 40 MG tablet TAKE 1 TABLET BY MOUTH AT BEDTIME  . triamterene-hydrochlorothiazide (MAXZIDE-25) 37.5-25 MG tablet Take 1 tablet by mouth once daily  . Vitamin D, Cholecalciferol, 50 MCG (2000 UT) CAPS Take by mouth.   No facility-administered encounter medications on file as of 02/23/2020.    Review of patient status, including review of consultants reports, relevant laboratory and other test results, and collaboration with appropriate care team members and the patient's provider was performed as part of comprehensive patient evaluation and provision of chronic care management services.       Information about Care Management services was shared with Andrea [name] today including:  1. Care Management services include personalized support from designated clinical staff supervised by her physician, including individualized plan of care and coordination with other care providers 2. Remind patient of 24/7 contact phone numbers to provider's office for assistance with urgent and routine care needs. 3. Care Management services are voluntary and patient may stop at any time .   Patient agreed to services provided today and verbal consent obtained.

## 2020-03-08 ENCOUNTER — Ambulatory Visit: Payer: 59

## 2020-03-08 NOTE — Chronic Care Management (AMB) (Signed)
Care Management   RN Case Manager Follow Up Note  03/08/2020 Name: Andrea [name] MRN: [id number] DOB: [birth year] Andrea [name] is a 65 y.o. year old female who sees [name], MD for primary care.  Patient is enrolled in a Managed Medicaid plan: No.  The Care Management team was consulted by PCP  to assist the patient with . Disease Management and  Educational Needs.   RNCM engaged with [place] today Engaged with patient by telephone  in response to provider referral for RN case management and/or care coordination services. See care plan below for details during this encounter.  Follow up Plan: The care management team will reach out to the patient again over the next 7 days.     Advanced Directives Status:Not addressed in this encounter.     SDOH (Social Determinants of Health) assessments performed: No     Patient Care Plan: RN Case Management  Problem Identified: Hypertension Management   Long-Range Goal: Hypertension Monitored   Start Date: 01/06/2019  Expected End Date: 05/24/2020  Priority: Medium  Note:    Current Barriers:  [name] Knowledge Deficits related to basic understanding of hypertension pathophysiology and self care management  Nurse Case Manager Clinical Goal(s):   [name] Over the next 90 days, patient will demonstrate improved adherence to prescribed treatment plan for hypertension as evidenced by taking all medications as prescribed, monitoring and recording blood pressure as directed, adhering to low sodium/DASH diet  Interventions:  . Evaluation of current treatment plan related to hypertension self management and patient's adherence to plan as established by provider. . Provided education to patient re: stroke prevention, s/s of heart attack and stroke, DASH diet, complications of uncontrolled blood pressure . Reviewed medications with patient and discussed importance of compliance . Discussed plans with patient for ongoing care  management follow up and provided patient with direct contact information for care management team . Advised patient, providing education and rationale, to monitor blood pressure daily and record, calling PCP for findings outside established parameters. - patient is checking her BP.  She checked BP on the 7th with both a arm cuff and wrist cuff.  Her vaue with the arm cuff was 193/62 and writs was 111/55.  Advised the patient that she may not be using the correct form when checking.  RNCM wiil resend information on how to check your BP . Reviewed scheduled/upcoming provider appointments including:  . healthy diet promoted She is trying to slowing move away from fried food and do baked. . medication adherence assessment completed-patient is taking her medications as prescribed  . Reviewed the signs and symptoms of HTN and the patient denies any. . support and encouragement provided- to continue to check and record values, exercise, and take medications . Patient does not sleep completely through the night.  She states that she sleeps from 10 or 11 to about 3 am that morning.  She wilol be up for a while then she will go back to sleep.  She never sleep the whole night.  Patient Goals/Self Care Activities:  . Self administers medications as prescribed . Attends all scheduled provider appointments . Calls provider office for new concerns, questions, or BP outside discussed parameters . Checks BP and records as discussed . Follows a low sodium diet/DASH diet . Find her arm BP monitor and scale and start to record here values . agree to work together to make changes . ask questions to understand . check blood pressure 3 times  per week       Outpatient Encounter Medications as of 03/08/2020  Medication Sig  . albuterol (VENTOLIN HFA) 108 (90 Base) MCG/ACT inhaler Inhale 2 puffs into the lungs every 6 (six) hours as needed for wheezing or shortness of breath.  [name] aspirin 81 MG chewable tablet Chew 81  mg by mouth daily.  . Blood Pressure Monitoring (BLOOD PRESSURE MONITOR/L CUFF) MISC Check blood pressure 3 times per week.  . carvedilol (COREG) 6.25 MG tablet TAKE 1 TABLET BY MOUTH TWICE DAILY WITH A MEAL  . famotidine (PEPCID) 20 MG tablet Take 1 tablet (20 mg total) by mouth daily.  . fluticasone (FLONASE) 50 MCG/ACT nasal spray Place 1 spray into both nostrils daily. 1 spray in each nostril every day  . levothyroxine (SYNTHROID) 137 MCG tablet TAKE 1 TABLET BY MOUTH ONCE DAILY BEFORE BREAKFAST  . loratadine (CLARITIN) 10 MG tablet Take 1 tablet (10 mg total) by mouth daily.  . sertraline (ZOLOFT) 50 MG tablet Take 3 tablets by mouth once daily  . simvastatin (ZOCOR) 40 MG tablet TAKE 1 TABLET BY MOUTH AT BEDTIME  . triamterene-hydrochlorothiazide (MAXZIDE-25) 37.5-25 MG tablet Take 1 tablet by mouth once daily  . Vitamin D, Cholecalciferol, 50 MCG (2000 UT) CAPS Take by mouth.   No facility-administered encounter medications on file as of 03/08/2020.    Review of patient status, including review of consultants reports, relevant laboratory and other test results, and collaboration with appropriate care team members and the patient's provider was performed as part of comprehensive patient evaluation and provision of chronic care management services.       Information about Care Management services was shared with Andrea [name] today including:  1. Care Management services include personalized support from designated clinical staff supervised by her physician, including individualized plan of care and coordination with other care providers 2. Remind patient of 24/7 contact phone numbers to provider's office for assistance with urgent and routine care needs. 3. Care Management services are voluntary and patient may stop at any time .   Patient agreed to services provided today and verbal consent obtained.

## 2020-03-08 NOTE — Patient Instructions (Signed)
Ms. Hefter  it was nice speaking with you. Please call me directly [phone] if you have questions about the goals we discussed.  Goals Addressed              This Visit's Progress   .  Monitor My BP (pt-stated)        Patient Goals/Self Care Activities:  . Self administers medications as prescribed . Attends all scheduled provider appointments . Calls provider office for new concerns, questions, or BP outside discussed parameters . Checks BP and records as discussed . Follows a low sodium diet/DASH diet . Find her arm BP monitor and scale and start to record here values- Make sure that she is using the correct form when checking her BP . agree to work together to make changes . ask questions to understand . check blood pressure 3 times per week       Patient Care Plan: RN Case Management  Problem Identified: Hypertension Management   Long-Range Goal: Hypertension Monitored   Start Date: 01/06/2019  Expected End Date: 05/24/2020  Priority: Medium  Note:    Current Barriers:  [name] Knowledge Deficits related to basic understanding of hypertension pathophysiology and self care management  Nurse Case Manager Clinical Goal(s):   [name] Over the next 90 days, patient will demonstrate improved adherence to prescribed treatment plan for hypertension as evidenced by taking all medications as prescribed, monitoring and recording blood pressure as directed, adhering to low sodium/DASH diet  Interventions:  . Evaluation of current treatment plan related to hypertension self management and patient's adherence to plan as established by provider. . Provided education to patient re: stroke prevention, s/s of heart attack and stroke, DASH diet, complications of uncontrolled blood pressure . Reviewed medications with patient and discussed importance of compliance . Discussed plans with patient for ongoing care management follow up and provided patient with direct contact information for care  management team . Advised patient, providing education and rationale, to monitor blood pressure daily and record, calling PCP for findings outside established parameters. - patient is checking her BP.  She checked BP on the 7th with both a arm cuff and wrist cuff.  Her vaue with the arm cuff was 193/62 and writs was 111/55.  Advised the patient that she may not be using the correct form when checking.  RNCM wiil resend information on how to check your BP . Reviewed scheduled/upcoming provider appointments including:  . healthy diet promoted She is trying to slowing move away from fried food and do baked. . medication adherence assessment completed-patient is taking her medications as prescribed  . Reviewed the signs and symptoms of HTN and the patient denies any. . support and encouragement provided- to continue to check and record values, exercise, and take medications . Patient does not sleep completely through the night.  She states that she sleeps from 10 or 11 to about 3 am that morning.  She wilol be up for a while then she will go back to sleep.  She never sleep the whole night.   Follow up Plan: The care management team will reach out to the patient again over the next 7 days.         Ms. [place] received Care Management services today:  1. Care Management services include personalized support from designated clinical staff supervised by her physician, including individualized plan of care and coordination with other care providers 2. 24/7 contact [phone] for assistance for urgent and routine care needs. 3. Care Management  are voluntary services and be declined at any time by calling the office.  The patient verbalized understanding of instructions provided today and declined a print copy of patient instruction materials.    Lazaro Arms, RN

## 2020-03-29 ENCOUNTER — Ambulatory Visit: Payer: 59

## 2020-03-29 NOTE — Patient Instructions (Signed)
Visit Information  Ms. Napierala  it was nice speaking with you. Please call me directly [phone] if you have questions about the goals we discussed.  Goals Addressed              This Visit's Progress   .  Monitor My BP (pt-stated)        Timeframe:  Long-Range Goal Priority:  High Start Date:    01/06/19                         Expected End Date:  3/ 31/22                      Patient Goals/Self Care Activities:  . Self administers medications as prescribed . Attends all scheduled provider appointments . Calls provider office for new concerns, questions, or BP outside discussed parameters . Checks BP and records as discussed . Follows a low sodium diet/DASH diet . Patient will choose one BP cuff and use it to check her BP       The patient verbalized understanding of instructions, educational materials, and care plan provided today and declined offer to receive copy of patient instructions, educational materials, and care plan.   Follow up Plan: Patient would like continued follow-up.  CCM RNCM will outreach the patient within the next 21 days.. Patient will call office if needed prior to next encounter  Lazaro Arms, RN

## 2020-03-29 NOTE — Chronic Care Management (AMB) (Signed)
Care Management    RN Visit Note  03/29/2020 Name: Andrea [name] MRN: [id number] DOB: Jun 26, 1955  Subjective: Andrea [name] is a 65 y.o. year old female who is a primary care patient of Andrea Feil, MD. The care management team was consulted for assistance with disease management and care coordination needs.    Engaged with patient by telephone for follow up visit in response to provider referral for case management and/or care coordination services.   Consent to Services:   Andrea [name] was given information about Care Management services today including:  1. Care Management services includes personalized support from designated clinical staff supervised by her physician, including individualized plan of care and coordination with other care providers 2. 24/7 contact phone numbers for assistance for urgent and routine care needs. 3. The patient may stop case management services at any time by phone call to the office staff.  Patient agreed to services and consent obtained.    Assessment: Patient continues to experience difficulty with cheking her BP and getting elevated readings... See Care Plan below for interventions and patient self-care actives. Follow up Plan: Patient would like continued follow-up.  CCM RNCM will outreach the patient within 21 days. Patient will call office if needed prior to next encounter  Review of patient past medical history, allergies, medications, health status, including review of consultants reports, laboratory and other test data, was performed as part of comprehensive evaluation and provision of chronic care management services.   SDOH (Social Determinants of Health) assessments and interventions performed:    Care Plan  No Known Allergies  Outpatient Encounter Medications as of 03/29/2020  Medication Sig  . aspirin 81 MG chewable tablet Chew 81 mg by mouth daily.  . carvedilol (COREG) 6.25 MG tablet TAKE 1 TABLET BY MOUTH TWICE  DAILY WITH A MEAL  . famotidine (PEPCID) 20 MG tablet Take 1 tablet (20 mg total) by mouth daily.  . fluticasone (FLONASE) 50 MCG/ACT nasal spray Place 1 spray into both nostrils daily. 1 spray in each nostril every day  . levothyroxine (SYNTHROID) 137 MCG tablet TAKE 1 TABLET BY MOUTH ONCE DAILY BEFORE BREAKFAST  . loratadine (CLARITIN) 10 MG tablet Take 1 tablet (10 mg total) by mouth daily.  . sertraline (ZOLOFT) 50 MG tablet Take 3 tablets by mouth once daily  . simvastatin (ZOCOR) 40 MG tablet TAKE 1 TABLET BY MOUTH AT BEDTIME  . triamterene-hydrochlorothiazide (MAXZIDE-25) 37.5-25 MG tablet Take 1 tablet by mouth once daily  . Vitamin D, Cholecalciferol, 50 MCG (2000 UT) CAPS Take by mouth.  [name] albuterol (VENTOLIN HFA) 108 (90 Base) MCG/ACT inhaler Inhale 2 puffs into the lungs every 6 (six) hours as needed for wheezing or shortness of breath. (Patient not taking: Reported on 03/29/2020)  . Blood Pressure Monitoring (BLOOD PRESSURE MONITOR/L CUFF) MISC Check blood pressure 3 times per week.   No facility-administered encounter medications on file as of 03/29/2020.    Patient Active Problem List   Diagnosis Date Noted  . Esophageal reflux 08/14/2017  . Vitamin D deficiency 01/12/2017  . Ear pain 01/12/2017  . Cataract 09/19/2013  . Hypothyroidism 04/26/2006  . Hyperlipidemia 04/26/2006  . Morbid obesity ([place]) 04/26/2006  . Major depressive disorder, recurrent episode ([place]) 04/26/2006  . HYPERTENSION, BENIGN SYSTEMIC 04/26/2006    Conditions to be addressed/monitored: HTN  Care Plan : RN Case Management  Updates made by Andrea Arms, RN since 03/29/2020 12:00 AM  Problem: Hypertension Management   Long-Range Goal: Hypertension Monitored  Start Date: 01/06/2019  Expected End Date: 05/24/2020  Priority: Medium   Current Barriers:  [name] Knowledge Deficits related to basic understanding of hypertension pathophysiology and self care management  Nurse Case Manager Clinical Goal(s):    [name] Over the next 90 days, patient will demonstrate improved adherence to prescribed treatment plan for hypertension as evidenced by taking all medications as prescribed, monitoring and recording blood pressure as directed, adhering to low sodium/DASH diet  Interventions:  . Evaluation of current treatment plan related to hypertension self management and patient's adherence to plan as established by provider. . Provided education to patient re: stroke prevention, s/s of heart attack and stroke, DASH diet, complications of uncontrolled blood pressure . Reviewed medications with patient and discussed importance of compliance . Discussed plans with patient for ongoing care management follow up and provided patient with direct contact information for care management team . Advised patient, providing education and rationale, to monitor blood pressure daily and record, calling PCP for findings outside established parameters. - patient is checking her BP. She report continually getting high readings not sure if her blood pressures are high or if the readings are correct.  She is switching between the wrist and arm cuff.  Advised her to stay with one.  (preferably the arm cuff).  She is going to sign up for my chart and I am going to send her information on how to check her BP correctly. . Reviewed scheduled/upcoming provider appointments including:  . healthy diet promoted  . medication adherence assessment completed-patient is taking her medications as prescribed  . Reviewed the signs and symptoms of HTN and the patient denies any. . support and encouragement provided- to continue to check and record values, exercise, and take medications    Patient Goals/Self Care Activities:  . Self administers medications as prescribed . Attends all scheduled provider appointments . Calls provider office for new concerns, questions, or BP outside discussed parameters . Checks BP and records as discussed . Follows  a low sodium diet/DASH diet . Find her arm BP monitor and scale and start to record here values . agree to work together to make changes . ask questions to understand . check blood pressure 3 times per week      Andrea Arms RN, BSN, [place] Phone: [phone] I Fax: [phone]

## 2020-04-04 ENCOUNTER — Other Ambulatory Visit: Payer: Self-pay | Admitting: Family Medicine

## 2020-04-16 ENCOUNTER — Ambulatory Visit: Payer: 59

## 2020-04-17 NOTE — Patient Instructions (Signed)
Visit Information  Ms. Downie  it was nice speaking with you. Please call me directly [phone] if you have questions about the goals we discussed.  Goals Addressed              This Visit's Progress   .  Monitor My BP (pt-stated)        Timeframe:  Long-Range Goal Priority:  High Start Date:    01/06/19                         Expected End Date: 07/27/20             Patient Goals/Self Care Activities:  . Self administers medications as prescribed . Attends all scheduled provider appointments . Calls provider office for new concerns, questions, or BP outside discussed parameters . Checks BP and records as discussed . Follows a low sodium diet/DASH diet . Patient will choose one BP cuff and use it to check her BP       The patient verbalized understanding of instructions, educational materials, and care plan provided today and declined offer to receive copy of patient instructions, educational materials, and care plan.   Follow up Plan: Patient would like continued follow-up.  CCM RNCM will outreach the patient within the next 21 days.. Patient will call office if needed prior to next encounter  Lazaro Arms, RN

## 2020-04-17 NOTE — Chronic Care Management (AMB) (Signed)
Care Management    RN Visit Note  04/17/2020 Name: Andrea [name] MRN: [id number] DOB: Feb 19, 1956  Subjective: Andrea [name] is a 65 y.o. year old female who is a primary care patient of Andrea Feil, MD. The care management team was consulted for assistance with disease management and care coordination needs.    Engaged with patient by telephone for follow up visit in response to provider referral for case management and/or care coordination services.   Consent to Services:   [name] was given information about Care Management services today including:  1. Care Management services includes personalized support from designated clinical staff supervised by her physician, including individualized plan of care and coordination with other care providers 2. 24/7 contact phone numbers for assistance for urgent and routine care needs. 3. The patient may stop case management services at any time by phone call to the office staff.  Patient agreed to services and consent obtained.    Assessment: Patient continues to experience difficulty with elevated blood pressures... See Care Plan below for interventions and patient self-care actives. Follow up Plan: Patient would like continued follow-up.  CCM RNCM will outreachthe patient within the next 21 days. . Patient will call office if needed prior to next encounter  Review of patient past medical history, allergies, medications, health status, including review of consultants reports, laboratory and other test data, was performed as part of comprehensive evaluation and provision of chronic care management services.   SDOH (Social Determinants of Health) assessments and interventions performed:    Care Plan  No Known Allergies  Outpatient Encounter Medications as of 04/16/2020  Medication Sig  . albuterol (VENTOLIN HFA) 108 (90 Base) MCG/ACT inhaler Inhale 2 puffs into the lungs every 6 (six) hours as needed for wheezing or  shortness of breath. (Patient not taking: Reported on 03/29/2020)  . aspirin 81 MG chewable tablet Chew 81 mg by mouth daily.  . Blood Pressure Monitoring (BLOOD PRESSURE MONITOR/L CUFF) MISC Check blood pressure 3 times per week.  . carvedilol (COREG) 6.25 MG tablet TAKE 1 TABLET BY MOUTH TWICE DAILY WITH A MEAL  . EUTHYROX 137 MCG tablet TAKE 1 TABLET BY MOUTH ONCE DAILY BEFORE BREAKFAST  . famotidine (PEPCID) 20 MG tablet Take 1 tablet (20 mg total) by mouth daily.  . fluticasone (FLONASE) 50 MCG/ACT nasal spray Place 1 spray into both nostrils daily. 1 spray in each nostril every day  . loratadine (CLARITIN) 10 MG tablet Take 1 tablet (10 mg total) by mouth daily.  . sertraline (ZOLOFT) 50 MG tablet Take 3 tablets by mouth once daily  . simvastatin (ZOCOR) 40 MG tablet TAKE 1 TABLET BY MOUTH AT BEDTIME  . triamterene-hydrochlorothiazide (MAXZIDE-25) 37.5-25 MG tablet Take 1 tablet by mouth once daily  . Vitamin D, Cholecalciferol, 50 MCG (2000 UT) CAPS Take by mouth.   No facility-administered encounter medications on file as of 04/16/2020.    Patient Active Problem List   Diagnosis Date Noted  . Esophageal reflux 08/14/2017  . Vitamin D deficiency 01/12/2017  . Ear pain 01/12/2017  . Cataract 09/19/2013  . Hypothyroidism 04/26/2006  . Hyperlipidemia 04/26/2006  . Morbid obesity ([place]) 04/26/2006  . Major depressive disorder, recurrent episode ([place]) 04/26/2006  . HYPERTENSION, BENIGN SYSTEMIC 04/26/2006    Conditions to be addressed/monitored: HTN  Care Plan : RN Case Management  Updates made by Lazaro Arms, RN since 04/17/2020 12:00 AM  Problem: Hypertension Management   Long-Range Goal: Hypertension Monitored   Start  Date: 01/06/2019  Expected End Date: 07/27/2020  This Visit's Progress: On track  Priority: High  Note:    Current Barriers:  [name] Knowledge Deficits related to basic understanding of hypertension pathophysiology and self care management  Nurse Case Manager  Clinical Goal(s):   [name] Over the next 90 days, patient will demonstrate improved adherence to prescribed treatment plan for hypertension as evidenced by taking all medications as prescribed, monitoring and recording blood pressure as directed, adhering to low sodium/DASH diet  Interventions:  . Evaluation of current treatment plan related to hypertension self management and patient's adherence to plan as established by provider. . Provided education to patient re: stroke prevention, s/s of heart attack and stroke, DASH diet, complications of uncontrolled blood pressure . Reviewed medications with patient and discussed importance of compliance . Discussed plans with patient for ongoing care management follow up and provided patient with direct contact information for care management team . Advised patient, providing education and rationale, to monitor blood pressure daily and record, calling PCP for findings outside established parameters. - patient is checking her BP. She states she is not checking as much as she should.  She is checking 2x/week.  Today her blood pressure was 167/64.  She reports she has a slight headache but no chest pain shortness of breath or swelling.  She feels that her readings may not be as accurate because her arm monitor broke.  RNCM advise her to use her insurance points to receive a BP monitor  . Reviewed scheduled/upcoming provider appointments including:  . healthy diet promoted -still monitor sodium in her food choices,  . medication adherence assessment completed-patient is taking her medications as prescribed  . support and encouragement provided- to continue to check and record values, exercise, and take medications . encouraged  patient  to be more active     Patient Goals/Self Care Activities:  . Self administers medications as prescribed . Attends all scheduled provider appointments . Calls provider office for new concerns, questions, or BP outside discussed  parameters . Checks BP and records as discussed . Follows a low sodium diet/DASH diet . Find her arm BP monitor and scale and start to record here values . agree to work together to make changes . ask questions to understand . check blood pressure 3 times per week      Lazaro Arms RN, BSN, [place] Phone: [phone] I Fax: [phone]

## 2020-05-10 ENCOUNTER — Ambulatory Visit: Payer: 59

## 2020-05-10 NOTE — Chronic Care Management (AMB) (Signed)
   05/10/2020  Andrea [name] 04-28-1955 [id number]  Name: Andrea [name] MRN: [id number] DOB: 1955-10-04  Andrea [name] is a 65 y.o. year old female who is a primary care patient of [name], MD . [place] reached out to [place] today by phone to talk with her about her Hypertension.  The patient stated that she was unable to talk at the time and would like for me to call her later in the week.   Follow Up Plan: Telephone follow up appointment with care management team member scheduled for: 05/13/20   Lazaro Arms RN, BSN, [facility] Care Management Coordinator [place] Phone: [phone] I Fax: [phone]

## 2020-05-13 ENCOUNTER — Telehealth: Payer: Self-pay

## 2020-05-13 ENCOUNTER — Telehealth: Payer: 59

## 2020-05-13 NOTE — Telephone Encounter (Signed)
  Care Management   Outreach Note  05/13/2020 Name: Andrea [name] MRN: [id number] DOB: 01/20/1956  Referred by: [name], MD Reason for referral : Chronic Care Management (HTN)   An unsuccessful telephone outreach was attempted today. The patient was referred to the case management team for assistance with care management and care coordination.   Follow Up Plan: A HIPAA compliant phone message was left for the patient providing contact information and requesting a return call.  The care management team will reach out to the patient again over the next 7-14 days.   Lazaro Arms RN, BSN, [facility] Care Management Coordinator [place] Phone: [phone] I Fax: [phone]

## 2020-05-20 ENCOUNTER — Other Ambulatory Visit: Payer: Self-pay | Admitting: Family Medicine

## 2020-05-21 NOTE — Telephone Encounter (Signed)
Rescheduled 06/10/2020

## 2020-06-10 ENCOUNTER — Ambulatory Visit: Payer: 59

## 2020-06-10 ENCOUNTER — Other Ambulatory Visit: Payer: Self-pay

## 2020-06-10 MED ORDER — ALBUTEROL SULFATE HFA 108 (90 BASE) MCG/ACT IN AERS: 2.0000 | INHALATION_SPRAY | Freq: Four times a day (QID) | RESPIRATORY_TRACT | 1 refills | Status: DC | PRN

## 2020-06-10 NOTE — Telephone Encounter (Signed)
Patient calls nurse line requesting refill on albuterol inhaler. Patient reports noticing an increased work of breathing with exertion. Denies chest pain and is not actively short of breath. Patient is able to speak in complete sentences.   If appropriate, please send to [facility] on [address].   Strict ED precautions given.   Talbot Grumbling, RN

## 2020-06-11 NOTE — Patient Instructions (Signed)
Visit Information  Ms. Glanz  it was nice speaking with you. Please call me directly [phone] if you have questions about the goals we discussed.  Goals Addressed              This Visit's Progress   .  Monitor My BP (pt-stated)   On track     Timeframe:  Long-Range Goal Priority:  High Start Date:    01/06/19                         Expected End Date: 08/26/20             Patient Goals/Self Care Activities:  . Self administers medications as prescribed . Attends all scheduled provider appointments . Calls provider office for new concerns, questions, or BP outside discussed parameters . Checks BP and records as discussed . Follows a low sodium diet/DASH diet . Patient will choose one BP cuff and use it to check her BP       The patient verbalized understanding of instructions, educational materials, and care plan provided today and declined offer to receive copy of patient instructions, educational materials, and care plan.   Follow up Plan: Patient would like continued follow-up.  CCM RNCM will outreach the patient within the next 4 weeks  Patient will call office if needed prior to next encounter  Lazaro Arms, RN  [phone]

## 2020-06-11 NOTE — Chronic Care Management (AMB) (Signed)
Care Management    RN Visit Note  06/11/2020 Name: Andrea [name] MRN: [id number] DOB: [birth year]  Subjective: Andrea [name] is a 65 y.o. year old female who is a primary care patient of [name], MD. The care management team was consulted for assistance with disease management and care coordination needs.    Engaged with patient by telephone for follow up visit in response to provider referral for case management and/or care coordination services.   The patient was given information about Chronic Care Management services, agreed to services, and gave verbal consent prior to initiation of services.  Please see initial visit note for detailed documentation.    Patient agreed to services and consent obtained.    Assessment: Patient is making progress with checking her BP and trying to improve her exercise. . See Care Plan below for interventions and patient self-care actives. Follow up Plan: Patient would like continued follow-up.  CCM RNCM will outreach the patient within the next 4 weeks  Patient will call office if needed prior to next encounter  Review of patient past medical history, allergies, medications, health status, including review of consultants reports, laboratory and other test data, was performed as part of comprehensive evaluation and provision of chronic care management services.   SDOH (Social Determinants of Health) assessments and interventions performed:    Care Plan  No Known Allergies  Outpatient Encounter Medications as of 06/10/2020  Medication Sig  . aspirin 81 MG chewable tablet Chew 81 mg by mouth daily.  . Blood Pressure Monitoring (BLOOD PRESSURE MONITOR/L CUFF) MISC Check blood pressure 3 times per week.  . carvedilol (COREG) 6.25 MG tablet TAKE 1 TABLET BY MOUTH TWICE DAILY WITH A MEAL  . EUTHYROX 137 MCG tablet TAKE 1 TABLET BY MOUTH ONCE DAILY BEFORE BREAKFAST  . famotidine (PEPCID) 20 MG tablet Take 1 tablet by mouth once daily  .  fluticasone (FLONASE) 50 MCG/ACT nasal spray Place 1 spray into both nostrils daily. 1 spray in each nostril every day  . loratadine (CLARITIN) 10 MG tablet Take 1 tablet (10 mg total) by mouth daily.  . sertraline (ZOLOFT) 50 MG tablet Take 3 tablets by mouth once daily  . simvastatin (ZOCOR) 40 MG tablet TAKE 1 TABLET BY MOUTH AT BEDTIME  . triamterene-hydrochlorothiazide (MAXZIDE-25) 37.5-25 MG tablet Take 1 tablet by mouth once daily  . Vitamin D, Cholecalciferol, 50 MCG (2000 UT) CAPS Take by mouth.  . [DISCONTINUED] albuterol (VENTOLIN HFA) 108 (90 Base) MCG/ACT inhaler Inhale 2 puffs into the lungs every 6 (six) hours as needed for wheezing or shortness of breath. (Patient not taking: No sig reported)   No facility-administered encounter medications on file as of 06/10/2020.    Patient Active Problem List   Diagnosis Date Noted  . Esophageal reflux 08/14/2017  . Vitamin D deficiency 01/12/2017  . Ear pain 01/12/2017  . Cataract 09/19/2013  . Hypothyroidism 04/26/2006  . Hyperlipidemia 04/26/2006  . Morbid obesity ([place]) 04/26/2006  . Major depressive disorder, recurrent episode ([place]) 04/26/2006  . HYPERTENSION, BENIGN SYSTEMIC 04/26/2006    Conditions to be addressed/monitored: HTN  Care Plan : RN Case Management  Updates made by Lazaro Arms, RN since 06/11/2020 12:00 AM  Problem: Hypertension Management   Priority: High  Onset Date: 01/06/2019  Current Barriers:   Knowledge Deficits related to basic understanding of hypertension pathophysiology and self-care management   Nurse Case Manager Clinical Goal(s):     Over the next 90 days, patient will demonstrate  improved adherence to prescribed treatment plan for hypertension as evidenced by taking all medications as prescribed, monitoring and recording blood pressure as directed, adhering to low sodium/DASH diet   Interventions:   Evaluation of current treatment plan related to hypertension self-management and patient's  adherence to plan as established by provider.  Provided education to patient re: stroke prevention, s/s of heart attack and stroke, DASH diet, complications of uncontrolled blood pressure  Reviewed medications with patient and discussed importance of compliance  Discussed plans with patient for ongoing care management follow up and provided patient with direct contact information for care management team  Advised patient, providing education and rationale, to monitor blood pressure daily and record, calling PCP for findings outside established parameters. - patient is checking her BP. She states she is not checking as much as she should.  She is checking 2x/week.  Today her blood pressure was 164/88.  She reports she is trying to walk more but must stop due to shortness of breath.  RNCM advised her to use her albuterol inhaler.  She states that she has never used it before.  Advised her to call the pharmacy to get a refill for her inhaler and use as directed.  She verbalized understanding.  She is waiting to receive her new BP monitor that she ordered through her insurance.  This will be an arm cuff.   Reviewed scheduled/upcoming provider appointments including:   healthy diet promoted -still monitor sodium in her food choices,   medication adherence assessment completed patient is taking her medications as prescribed   support and encouragement provided- to continue to check and record values, exercise, and take medications  encouraged patient to be more active        Patient Goals/Self Care Activities:   Self-administers medications as prescribed  Attends all scheduled provider appointments  Calls provider office for new concerns, questions, or BP outside discussed parameters  Checks BP and records as discussed  Follows a low sodium diet/DASH diet  Find her arm BP monitor and scale and start to record here values  agree to work together to make changes  ask questions to  understand  check blood pressure 3 times per week         Lazaro Arms RN, BSN, [place] Management Coordinator [place] Phone: [phone] I Fax: [phone]

## 2020-06-25 ENCOUNTER — Other Ambulatory Visit: Payer: Self-pay | Admitting: Family Medicine

## 2020-07-08 ENCOUNTER — Ambulatory Visit: Payer: 59

## 2020-07-08 NOTE — Patient Instructions (Signed)
Visit Information  Ms. Huebert  it was nice speaking with you. Please call me directly [phone] if you have questions about the goals we discussed.  Goals Addressed              This Visit's Progress   .  Monitor My BP (pt-stated)        /Timeframe:  Long-Range Goal Priority:  High Start Date:    01/06/19                         /Expected End Date: 09/24/20             Patient Goals/Self Care Activities:  . Self administers medications as prescribed . Attends all scheduled provider appointments . Calls provider office for new concerns, questions, or BP outside discussed parameters . Checks BP and records as discussed . Follows a low sodium diet/DASH diet . Patient will choose one BP cuff and use it to check her BP       The patient verbalized understanding of instructions, educational materials, and care plan provided today and declined offer to receive copy of patient instructions, educational materials, and care plan.   Follow up Plan: Patient would like continued follow-up.  CCM RNCM will outreach the patient within the next 6  Patient will call office if needed prior to next encounterweeks.   Lazaro Arms, RN  [phone]

## 2020-07-08 NOTE — Chronic Care Management (AMB) (Signed)
Care Management    RN Visit Note  07/08/2020 Name: Andrea [name] MRN: [id number] DOB: 1955/11/05  Subjective: Andrea [name] is a 65 y.o. year old female who is a primary care patient of [name], MD. The care management team was consulted for assistance with disease management and care coordination needs.    Engaged with patient by telephone for follow up visit in response to provider referral for case management and/or care coordination services.   The patient was given information about Chronic Care Management services, agreed to services, and gave verbal consent prior to initiation of services.  Please see initial visit note for detailed documentation. Patient agreed to services and consent obtained.    Assessment: Patient is currently experiencing difficulty with shortness of breath... See Care Plan below for interventions and patient self-care actives. Follow up Plan: Patient would like continued follow-up.  CCM RNCM  will outreach the patient within the next 6 weeks  Patient will call office if needed prior to next encounter : Review of patient past medical history, allergies, medications, health status, including review of consultants reports, laboratory and other test data, was performed as part of comprehensive evaluation and provision of chronic care management services.   SDOH (Social Determinants of Health) assessments and interventions performed:    Care Plan  No Known Allergies  Outpatient Encounter Medications as of 07/08/2020  Medication Sig  . albuterol (VENTOLIN HFA) 108 (90 Base) MCG/ACT inhaler Inhale 2 puffs into the lungs every 6 (six) hours as needed for wheezing or shortness of breath.  [name] aspirin 81 MG chewable tablet Chew 81 mg by mouth daily.  . Blood Pressure Monitoring (BLOOD PRESSURE MONITOR/L CUFF) MISC Check blood pressure 3 times per week.  . carvedilol (COREG) 6.25 MG tablet TAKE 1 TABLET BY MOUTH TWICE DAILY WITH A MEAL  . EUTHYROX  137 MCG tablet TAKE 1 TABLET BY MOUTH ONCE DAILY BEFORE BREAKFAST  . famotidine (PEPCID) 20 MG tablet Take 1 tablet by mouth once daily  . fluticasone (FLONASE) 50 MCG/ACT nasal spray Place 1 spray into both nostrils daily. 1 spray in each nostril every day  . loratadine (CLARITIN) 10 MG tablet Take 1 tablet (10 mg total) by mouth daily.  . sertraline (ZOLOFT) 50 MG tablet Take 3 tablets by mouth once daily  . simvastatin (ZOCOR) 40 MG tablet TAKE 1 TABLET BY MOUTH AT BEDTIME  . triamterene-hydrochlorothiazide (MAXZIDE-25) 37.5-25 MG tablet Take 1 tablet by mouth once daily  . Vitamin D, Cholecalciferol, 50 MCG (2000 UT) CAPS Take by mouth.   No facility-administered encounter medications on file as of 07/08/2020.    Patient Active Problem List   Diagnosis Date Noted  . Esophageal reflux 08/14/2017  . Vitamin D deficiency 01/12/2017  . Ear pain 01/12/2017  . Cataract 09/19/2013  . Hypothyroidism 04/26/2006  . Hyperlipidemia 04/26/2006  . Morbid obesity ([place]) 04/26/2006  . Major depressive disorder, recurrent episode ([place]) 04/26/2006  . HYPERTENSION, BENIGN SYSTEMIC 04/26/2006    Conditions to be addressed/monitored: HTN  Care Plan : RN Case Management  Updates made by Lazaro Arms, RN since 07/08/2020 12:00 AM  Problem: Hypertension Management   Priority: High  Onset Date: 01/06/2019  Current Barriers:   Knowledge Deficits related to basic understanding of hypertension pathophysiology and self-care management   Nurse Case Manager Clinical Goal(s):     Over the next 90 days, patient will demonstrate improved adherence to prescribed treatment plan for hypertension as evidenced by taking all medications as  prescribed, monitoring and recording blood pressure as directed, adhering to low sodium/DASH diet   Interventions:   Evaluation of current treatment plan related to hypertension self-management and patient's adherence to plan as established by provider.  Provided education to  patient re: stroke prevention, s/s of heart attack and stroke, DASH diet, complications of uncontrolled blood pressure  Reviewed medications with patient and discussed importance of compliance  Discussed plans with patient for ongoing care management follow up and provided patient with direct contact information for care management team  Advised patient, providing education and rationale, to monitor blood pressure daily and record, calling PCP for findings outside established parameters. -   Reviewed scheduled/upcoming provider appointments including:   healthy diet promoted -still monitor sodium in her food choices,   medication adherence assessment completed patient is taking her medications as prescribed   support and encouragement provided- to continue to check and record values, exercise, and take medications  Patient stated that she has not thought about checking her BP because she has been concerned with her breathing.  She has picked up her inhaler because she noticed that she is not able to walk as far as she use to without getting winded.  The inhaler helps but she also has no energy.  Her family is concerned as well. Her mother said that she was born with respiratory issues and she would like to be evaluated.  RNCM will send her PCP a message via staff message.       Patient Goals/Self Care Activities:   Self-administers medications as prescribed  Attends all scheduled provider appointments  Calls provider office for new concerns, questions, or BP outside discussed parameters  Checks BP and records as discussed  Follows a low sodium diet/DASH diet  Find her arm BP monitor and scale and start to record here values  agree to work together to make changes  ask questions to understand  check blood pressure 3 times per week          Lazaro Arms, RN

## 2020-07-09 ENCOUNTER — Encounter: Payer: Self-pay | Admitting: Family Medicine

## 2020-07-09 ENCOUNTER — Telehealth (INDEPENDENT_AMBULATORY_CARE_PROVIDER_SITE_OTHER): Payer: 59 | Admitting: Family Medicine

## 2020-07-09 VITALS — Ht 64.0 in | Wt 220.0 lb

## 2020-07-09 DIAGNOSIS — R0602 Shortness of breath: Secondary | ICD-10-CM | POA: Diagnosis not present

## 2020-07-09 DIAGNOSIS — F331 Major depressive disorder, recurrent, moderate: Secondary | ICD-10-CM

## 2020-07-09 DIAGNOSIS — E038 Other specified hypothyroidism: Secondary | ICD-10-CM | POA: Diagnosis not present

## 2020-07-09 DIAGNOSIS — I10 Essential (primary) hypertension: Secondary | ICD-10-CM

## 2020-07-09 DIAGNOSIS — E785 Hyperlipidemia, unspecified: Secondary | ICD-10-CM

## 2020-07-09 MED ORDER — BUDESONIDE-FORMOTEROL FUMARATE 80-4.5 MCG/ACT IN AERO: 2.0000 | INHALATION_SPRAY | Freq: Two times a day (BID) | RESPIRATORY_TRACT | 1 refills | Status: DC

## 2020-07-09 NOTE — Patient Instructions (Signed)
Shortness of Breath, Adult Shortness of breath means you have trouble breathing. Shortness of breath could be a sign of a medical problem. Follow these instructions at home:  Watch for any changes in your symptoms.  Do not use any products that contain nicotine or tobacco, such as cigarettes, e-cigarettes, and chewing tobacco.  Do not smoke. Smoking can cause shortness of breath. If you need help to quit smoking, ask your doctor.  Avoid things that can make it harder to breathe, such as: ? Mold. ? Dust. ? Air pollution. ? Chemical smells. ? Things that can cause allergy symptoms (allergens), if you have allergies.  Keep your living space clean. Use products that help remove mold and dust.  Rest as needed. Slowly return to your normal activities.  Take over-the-counter and prescription medicines only as told by your doctor. This includes oxygen therapy and inhaled medicines.  Keep all follow-up visits as told by your doctor. This is important.   Contact a doctor if:  Your condition does not get better as soon as expected.  You have a hard time doing your normal activities, even after you rest.  You have new symptoms. Get help right away if:  Your shortness of breath gets worse.  You have trouble breathing when you are resting.  You feel light-headed or you pass out (faint).  You have a cough that is not helped by medicines.  You cough up blood.  You have pain with breathing.  You have pain in your chest, arms, shoulders, or belly (abdomen).  You have a fever.  You cannot walk up stairs.  You cannot exercise the way you normally do. These symptoms may represent a serious problem that is an emergency. Do not wait to see if the symptoms will go away. Get medical help right away. Call your local emergency services (911 in the [place] Do not drive yourself to the hospital. Summary  Shortness of breath is when you have trouble breathing enough air. It can be a sign of a  medical problem.  Avoid things that make it hard for you to breathe, such as smoking, pollution, mold, and dust.  Watch for any changes in your symptoms. Contact your doctor if you do not get better or you get worse. This information is not intended to replace advice given to you by your health care provider. Make sure you discuss any questions you have with your health care provider. Document Revised: 07/16/2017 Document Reviewed: 07/16/2017 Elsevier Patient Education  2021 Elsevier Inc.  

## 2020-07-09 NOTE — Assessment & Plan Note (Signed)
Per self reported weight, she has lost a lot of weight. BMI down to:Body mass index is 37.76 kg/m.  I commended her on her weight loss. Reassess at next visit with me.

## 2020-07-09 NOTE — Assessment & Plan Note (Signed)
Continue current regimen. I advised f/u with me soon for labs. I offered to schedule appointment, but she prefers to call back later for scheduling.

## 2020-07-09 NOTE — Progress Notes (Signed)
[place] Telemedicine Visit  Patient consented to have virtual visit and was identified by name and date of birth. Method of visit: Video was attempted but was interrupted: <50% of visit completed via video  Encounter participants: Patient: Andrea [name] - located at [employer] Provider: [name] - located at [facility] office Others (if applicable): N/A  Chief Complaint: SOB  HPI:  Shortness of Breath This is a chronic (tired easily with walking short distance and then get short winded) problem. The current episode started more than 1 year ago. The problem occurs daily (She experiences dypsnea on exertion). The problem has been gradually worsening. Associated symptoms include orthopnea and PND. Pertinent negatives include no chest pain, fever, headaches or leg swelling. The symptoms are aggravated by exercise and any activity (cleaning, walking). Associated symptoms comments: She coughs on and off, mostly in the morning. No hx of smoking.. The patient has no known risk factors (She gave hx of childhood respiratory symptoms. Her mother told her she turned blue as a baby.) for DVT/PE. She has tried beta agonist inhalers for the symptoms. The treatment provided moderate relief.   HTN/Hypothyroidism/HLD/Depression: She is complaint with all her meds and was able to tell me what she takes at home. No other concerns.  Weight management:   ROS: per HPI  Pertinent PMHx: PMX reviewed.  Exam:  Ht 5\' 4"  (1.626 m)   Wt 220 lb (99.8 kg)   BMI 37.76 kg/m   Respiratory: She could complete full sentences with no distress.  Assessment/Plan: Dyspnea:Worked up about a year ago with chest xray and ECHO which came back normal. Plan to repeat Xray and ECHO for hx of PND and orthopnea She denies ever smoked. However, PFT appointment scheduled with Dr. Valentina Lucks to assess for Chronic obstructive lung disease (COPD vs Asthma). In the mean time, I started her on Symbicort  pending her PFT eval.   HYPERTENSION, BENIGN SYSTEMIC Continue current regimen. I advised f/u with me soon for labs. I offered to schedule appointment, but she prefers to call back later for scheduling.  Hypothyroidism Stable on current regimen. She will call back for f/u appointment with me for TSH check.  Hyperlipidemia Continue current regimen. I advised f/u with me soon for labs. I offered to schedule appointment, but she prefers to call back later for scheduling.  Morbid obesity ([place]) Per self reported weight, she has lost a lot of weight. BMI down to:Body mass index is 37.76 kg/m.  I commended her on her weight loss. Reassess at next visit with me.   Major depressive disorder, recurrent episode (HCC) Stable on current regimen. Flowsheet Row Video Visit from 07/09/2020 in [place]  PHQ-9 Total Score 9         Time spent during visit with patient: 25 minutes

## 2020-07-09 NOTE — Progress Notes (Signed)
I contacted the patient today and a virtual appointment was scheduled for 10:50 AM today. She was not in distress with the brief conversation I had with her today.  I will complete a full evaluation and management during virtual visit.

## 2020-07-09 NOTE — Assessment & Plan Note (Signed)
Continue current regimen. I advised f/u with me soon for labs. I offered to schedule appointment, but she prefers to call back later for scheduling. 

## 2020-07-09 NOTE — Assessment & Plan Note (Signed)
Stable on current regimen. She will call back for f/u appointment with me for TSH check.

## 2020-07-09 NOTE — Assessment & Plan Note (Signed)
Stable on current regimen. Flowsheet Row Video Visit from 07/09/2020 in [place]  PHQ-9 Total Score 9

## 2020-07-14 ENCOUNTER — Other Ambulatory Visit: Payer: Self-pay | Admitting: Family Medicine

## 2020-07-14 ENCOUNTER — Telehealth: Payer: Self-pay

## 2020-07-14 MED ORDER — FLUTICASONE-SALMETEROL 45-21 MCG/ACT IN AERO: 2.0000 | INHALATION_SPRAY | Freq: Two times a day (BID) | RESPIRATORY_TRACT | 1 refills | Status: DC

## 2020-07-14 NOTE — Telephone Encounter (Signed)
Symbicort not covered by her insurance. Adviair sent in and I confirmed this was covered. Pharmacy advised to take Symbicort of her refill list.   Pharmacy need to order in her Advair and let her know when it is ready for pick up.

## 2020-07-14 NOTE — Telephone Encounter (Signed)
Refill  07/14/2020 [place]   [name], MD  Family Medicine  Medication Management  Reason for call    Conversation: Medication Management (Newest Message First)  Lazaro Arms, RN to Me      07/14/20 1:03 PM Dr [name]  She has been called and notified.   Thank you  Traci.    Me to [facility] . Lazaro Arms, RN      07/14/20 12:39 PM Hello blue team CMA,   Please contact this patient that her breathing medication has been switched from Symbicort to Advair which will cost her $15 with her insurance. Have her call pharmacy to confirm when her medication is ready for pick up.    Me     07/14/20 12:38 PM Note Symbicort not covered by her insurance. Adviair sent in and I confirmed this was covered. Pharmacy advised to take Symbicort of her refill list.   Pharmacy need to order in her Advair and let her know when it is ready for pick up.

## 2020-07-14 NOTE — Telephone Encounter (Signed)
   RN Case Manager Care Management   Phone Outreach    07/14/2020 Late entry Name: VONCILE SCHWARZ MRN: [id number] DOB: 1955/10/03  Kona A Wineland is a 65 y.o. year old female who is a primary care patient of [name], [name], MD .   CCM RNCM received a call today from the patient needing assistance with medication.  Patient was prescribed Symbacort inhaler, her insurance would not cover and it is going to cost $270.    Plan:RNCM will send staff message to provider and pharmacist asking for help with medication for the patient  Review of patient status, including review of consultants reports, relevant laboratory and other test results, and collaboration with appropriate care team members and the patient's provider was performed as part of comprehensive patient evaluation and provision of care management services.    Lazaro Arms RN, BSN, [facility] Care Management Coordinator [place] Phone: [phone] I Fax: [phone]

## 2020-07-22 ENCOUNTER — Encounter: Payer: Self-pay | Admitting: Pharmacist

## 2020-07-22 ENCOUNTER — Other Ambulatory Visit: Payer: Self-pay

## 2020-07-22 ENCOUNTER — Ambulatory Visit (HOSPITAL_COMMUNITY)
Admission: RE | Admit: 2020-07-22 | Discharge: 2020-07-22 | Disposition: A | Discharge status: Home / Self Care | Payer: 59 | Source: Ambulatory Visit | Attending: Family Medicine | Admitting: Family Medicine

## 2020-07-22 ENCOUNTER — Ambulatory Visit (INDEPENDENT_AMBULATORY_CARE_PROVIDER_SITE_OTHER): Payer: 59 | Admitting: Pharmacist

## 2020-07-22 DIAGNOSIS — R0600 Dyspnea, unspecified: Secondary | ICD-10-CM | POA: Diagnosis not present

## 2020-07-22 DIAGNOSIS — R0602 Shortness of breath: Secondary | ICD-10-CM | POA: Insufficient documentation

## 2020-07-22 NOTE — Patient Instructions (Addendum)
Hi, it was great to see you today.   Today we conducted a Pulmonary Function Test which showed reduced lung function.  You had improvement in the amount of air movement in the second set of tests.    Please do the following:  Continue your fluticasone 40mcg/salmeterol 90mcg (Advair HFA) inhaler 2 puffs into the lungs twice a day.  Continue taking your albuterol 108 mcg/act (Ventolin HFA) 2 puffs every 6 hours as needed for wheezing or shortness of  breath.   Follow-up in 4 weeks (June 23 at 3pm) with Dr. Valentina Lucks for reevaluation of breathing.

## 2020-07-22 NOTE — Assessment & Plan Note (Signed)
Patient has been experiencing shortness of breath and taking albuterol 108 mcg/act (Ventolin) inhaler 2 puffs BID, fluticasone-salmeterol 45-45mcg/act (Advair HFA) 2 puffs BID. Patient reports picking up Advair HFA yesterday (07/21/20) and took only 2 puffs. Today, patient reports not taking her Advair HFA. Medication adherence seems good. Spirometry evaluation reveals Moderate restrictive, non-reversible lung disease. Post nebulized albuterol tx revealed no significant change.  Pt reports feeling albuterol has helped her chest feel less tight. Plan to make no medication changes today and have her come back in 4 weeks to assess lung function on Advair. Consider titrating up Advair or adding on montelukast (Singulair) in the future if dyspnea persists.  -Continued fluticasone-salmeterol 45-55mcg/act (Advair HFA) 2 puffs BID -Continued albuterol 108 mcg/act (Ventolin) inhaler 2 puffs BID -Educated patient on proper technique for using MDI.  -Educated patient on purpose, proper use, potential adverse effects including risk of esophageal candidiasis and need to rinse mouth after each use.  -Reviewed results of pulmonary function tests.

## 2020-07-22 NOTE — Progress Notes (Signed)
   S:    Patient arrives with low energy and appears fatigued. She ambulates without assistance.  Presents for lung function evaluation.   Patient was referred and last seen by Primary Care Provider, Dr. [name], on 07/09/2020, for shortness of breath.   Patient reports atopic sx consistent with allergic rhinitis year around.  Patient reports adherence to medications  Current respiratory medications: albuterol 108 mcg/act (Ventolin) inhaler 2 puffs BID, fluticasone-salmeterol 45-45mcg/act (Advair HFA) 2 puffs BID Rescue inhaler use frequency: albuterol inhaler 2 puffs BID  Level of respiratory sx control- in the last 4 weeks: Question Scoring Patient Score  Daytime sx more than twice a week Yes (1) No (0) 1  Any nighttime waking due to lung condition Yes (1) No (0) 1  Reliever needed >2x/week Yes (1) No (0) 1  Any activity limitation due to lung condition Yes (1) No (0) 1   Total Score 4  Well controlled - 0, partly controlled - 1-2, uncontrolled 3-4  O: Physical Exam Psychiatric:        Behavior: Behavior normal.     Review of Systems  Constitutional: Positive for malaise/fatigue.  Respiratory: Positive for shortness of breath.      Vitals:   07/22/20 1512  BP: 130/80  Pulse: 87  SpO2: 94%    See "scanned report" or Documentation Flowsheet (discrete results - PFTs) for  Spirometry results. Patient provided good effort while attempting spirometry.   Lung Age = 58 CAT = 34 Albuterol Neb  Lot# [id number]     Exp. 10/27/2021  A/P: Patient has been experiencing shortness of breath and taking albuterol 108 mcg/act (Ventolin) inhaler 2 puffs BID, fluticasone-salmeterol 45-69mcg/act (Advair HFA) 2 puffs BID. Patient reports picking up Advair HFA yesterday (07/21/20) and took only 2 puffs. Today, patient reports not taking her Advair HFA. Medication adherence seems good. Spirometry evaluation reveals Moderate restrictive, non-reversible lung disease. Post nebulized  albuterol tx revealed no significant change.  Pt reports feeling albuterol has helped her chest feel less tight. Plan to make no medication changes today and have her come back in 4 weeks to assess lung function on Advair. Consider titrating up Advair or adding on montelukast (Singulair) in the future if dyspnea persists.  -Continued fluticasone-salmeterol 45-6mcg/act (Advair HFA) 2 puffs BID -Continued albuterol 108 mcg/act (Ventolin) inhaler 2 puffs BID -Educated patient on proper technique for using MDI.  -Educated patient on purpose, proper use, potential adverse effects including risk of esophageal candidiasis and need to rinse mouth after each use.  -Reviewed results of pulmonary function tests.   Patient verbalized understanding of results and education.  Written pt instructions provided.   F/U Rx Clinic visit in 4 weeks on June 23 to f/u on shortness of breath.   Total time in face to face counseling 45 minutes.  Patient seen with Marlowe Alt PharmD Candidate, and Lorel Monaco, PharmD, BCPS - PGY2 Pharmacy Resident.  [name]

## 2020-07-23 NOTE — Progress Notes (Signed)
Reviewed: I agree with Dr. Koval's documentation and management. 

## 2020-07-25 ENCOUNTER — Telehealth: Payer: Self-pay | Admitting: Family Medicine

## 2020-07-25 NOTE — Telephone Encounter (Signed)
Chest xray discussed.   No infection.  Small thoracic aorta dilation/Ecstasia - Unclear what the significance is.  Will reach out to radiology if further evaluation is required or just monitor for now.

## 2020-08-03 ENCOUNTER — Ambulatory Visit (INDEPENDENT_AMBULATORY_CARE_PROVIDER_SITE_OTHER): Payer: 59

## 2020-08-03 ENCOUNTER — Ambulatory Visit (INDEPENDENT_AMBULATORY_CARE_PROVIDER_SITE_OTHER): Payer: 59 | Admitting: Family Medicine

## 2020-08-03 ENCOUNTER — Other Ambulatory Visit: Payer: Self-pay

## 2020-08-03 ENCOUNTER — Encounter: Payer: Self-pay | Admitting: Family Medicine

## 2020-08-03 VITALS — BP 140/80 | HR 98 | Ht 64.0 in | Wt 270.8 lb

## 2020-08-03 DIAGNOSIS — Z1231 Encounter for screening mammogram for malignant neoplasm of breast: Secondary | ICD-10-CM

## 2020-08-03 DIAGNOSIS — Z23 Encounter for immunization: Secondary | ICD-10-CM

## 2020-08-03 DIAGNOSIS — E039 Hypothyroidism, unspecified: Secondary | ICD-10-CM

## 2020-08-03 DIAGNOSIS — I10 Essential (primary) hypertension: Secondary | ICD-10-CM

## 2020-08-03 DIAGNOSIS — E785 Hyperlipidemia, unspecified: Secondary | ICD-10-CM

## 2020-08-03 DIAGNOSIS — R0600 Dyspnea, unspecified: Secondary | ICD-10-CM

## 2020-08-03 DIAGNOSIS — I509 Heart failure, unspecified: Secondary | ICD-10-CM | POA: Insufficient documentation

## 2020-08-03 DIAGNOSIS — F331 Major depressive disorder, recurrent, moderate: Secondary | ICD-10-CM

## 2020-08-03 MED ORDER — FLUTICASONE-SALMETEROL 45-21 MCG/ACT IN AERO: 2.0000 | INHALATION_SPRAY | Freq: Two times a day (BID) | RESPIRATORY_TRACT | 5 refills | Status: DC

## 2020-08-03 MED ORDER — ZOSTER VAC RECOMB ADJUVANTED 50 MCG/0.5ML IM SUSR: 0.5000 mL | Freq: Once | INTRAMUSCULAR | 1 refills | Status: AC

## 2020-08-03 NOTE — Progress Notes (Signed)
    SUBJECTIVE:   CHIEF COMPLAINT / HPI:   SOB: Here for follow up. She is doing well on albuterol prn and Advair. She need a refill of her Advair.  CHF: No new concerns. She is doing well.  Hypothyroidism: Here for f/u. She is compliant with Synthroid 135 mgc QD.  HLD/HTN: She sometimes forgets to take her Synthroid 2-3 times per week on and off. Otherwise, she is compliant.  MDD: Compliant and doing well with her Trazodone.  Diarrhea: C/O intermittent loose bowel after meal 1-2 times per month intermittently. No other GI symptoms.   PERTINENT  PMH / PSH: PMX reviewed.  OBJECTIVE:   Vitals:   08/03/20 1053  BP: 140/80  Pulse: 98  SpO2: 97%  Weight: 270 lb 12.8 oz (122.8 kg)  Height: 5\' 4"  (1.626 m)   Physical Exam Vitals and nursing note reviewed.  Constitutional:      Appearance: Normal appearance.  Cardiovascular:     Rate and Rhythm: Normal rate and regular rhythm.     Heart sounds: Normal heart sounds. No murmur heard.   Pulmonary:     Effort: Pulmonary effort is normal.     Breath sounds: Normal breath sounds. No wheezing.  Abdominal:     General: Bowel sounds are normal. There is no distension.     Palpations: Abdomen is soft. There is no mass.  Musculoskeletal:     Right lower leg: No edema.     Left lower leg: No edema.  Neurological:     Mental Status: She is oriented to person, place, and time.  Psychiatric:        Mood and Affect: Mood normal.        ASSESSMENT/PLAN:   HYPERTENSION, BENIGN SYSTEMIC BP looks good. No new concerns. Here for lab work.  Hyperlipidemia Bmet and FLP checked today.  Congestive heart failure (HCC) Stable. Monitor closely.  Dyspnea PFT inconclusive. Improved on Advair. Will continue for now. Med refilled.  Hypothyroidism TSH checked today.  Major depressive disorder, recurrent episode ([place]) Stable on current dose of trazodone.  Morbid obesity ([place]) Monitor closely.   Intermittent diarrhea: ??  IBS Up to date with cologuard colon cancer screen which is her preference. Monitor for now.   Vaccination updated - PCV20 and COVID-19 shots. Shingrix discussed and was escribed. Mammogram recommended. She will schedule appointment.  [name], MD [place]

## 2020-08-03 NOTE — Assessment & Plan Note (Signed)
TSH checked today.

## 2020-08-03 NOTE — Assessment & Plan Note (Signed)
Stable. Monitor closely.

## 2020-08-03 NOTE — Assessment & Plan Note (Addendum)
BP looks good. No new concerns. Here for lab work.

## 2020-08-03 NOTE — Assessment & Plan Note (Signed)
Monitor closely

## 2020-08-03 NOTE — Assessment & Plan Note (Signed)
Stable on current dose of trazodone.

## 2020-08-03 NOTE — Assessment & Plan Note (Signed)
Bmet and FLP checked today.

## 2020-08-03 NOTE — Assessment & Plan Note (Signed)
PFT inconclusive. Improved on Advair. Will continue for now. Med refilled.

## 2020-08-03 NOTE — Patient Instructions (Signed)
Zoster Vaccine, Recombinant injection What is this medicine? ZOSTER VACCINE (ZOS ter vak SEEN) is a vaccine used to reduce the risk of getting shingles. This vaccine is not used to treat shingles or nerve pain from shingles. This medicine may be used for other purposes; ask your health care provider or pharmacist if you have questions. COMMON BRAND NAME(S): [facility] What should I tell my health care provider before I take this medicine? They need to know if you have any of these conditions:  cancer  immune system problems  an unusual or allergic reaction to Zoster vaccine, other medications, foods, dyes, or preservatives  pregnant or trying to get pregnant  breast-feeding How should I use this medicine? This vaccine is injected into a muscle. It is given by a health care provider. A copy of Vaccine Information Statements will be given before each vaccination. Be sure to read this information carefully each time. This sheet may change often. Talk to your health care provider about the use of this vaccine in children. This vaccine is not approved for use in children. Overdosage: If you think you have taken too much of this medicine contact a poison control center or emergency room at once. NOTE: This medicine is only for you. Do not share this medicine with others. What if I miss a dose? Keep appointments for follow-up (booster) doses. It is important not to miss your dose. Call your health care provider if you are unable to keep an appointment. What may interact with this medicine?  medicines that suppress your immune system  medicines to treat cancer  steroid medicines like prednisone or cortisone This list may not describe all possible interactions. Give your health care provider a list of all the medicines, herbs, non-prescription drugs, or dietary supplements you use. Also tell them if you smoke, drink alcohol, or use illegal drugs. Some items may interact with your medicine. What  should I watch for while using this medicine? Visit your health care provider regularly. This vaccine, like all vaccines, may not fully protect everyone. What side effects may I notice from receiving this medicine? Side effects that you should report to your doctor or health care professional as soon as possible:  allergic reactions (skin rash, itching or hives; swelling of the face, lips, or tongue)  trouble breathing Side effects that usually do not require medical attention (report these to your doctor or health care professional if they continue or are bothersome):  chills  headache  fever  nausea  pain, redness, or irritation at site where injected  tiredness  vomiting This list may not describe all possible side effects. Call your doctor for medical advice about side effects. You may report side effects to FDA at 1-800-FDA-1088. Where should I keep my medicine? This vaccine is only given by a health care provider. It will not be stored at home. NOTE: This sheet is a summary. It may not cover all possible information. If you have questions about this medicine, talk to your doctor, pharmacist, or health care provider.  2021 Elsevier/Gold Standard (2019-03-21 16:23:07)

## 2020-08-04 ENCOUNTER — Other Ambulatory Visit: Payer: Self-pay | Admitting: Family Medicine

## 2020-08-04 ENCOUNTER — Telehealth: Payer: Self-pay | Admitting: Family Medicine

## 2020-08-04 DIAGNOSIS — N179 Acute kidney failure, unspecified: Secondary | ICD-10-CM

## 2020-08-04 DIAGNOSIS — E876 Hypokalemia: Secondary | ICD-10-CM

## 2020-08-04 LAB — BASIC METABOLIC PANEL
BUN/Creatinine Ratio: 16 (ref 12–28)
BUN: 18 mg/dL (ref 8–27)
CO2: 24 mmol/L (ref 20–29)
Calcium: 9.6 mg/dL (ref 8.7–10.3)
Chloride: 101 mmol/L (ref 96–106)
Creatinine, Ser: 1.15 mg/dL — ABNORMAL HIGH (ref 0.57–1.00)
Glucose: 89 mg/dL (ref 65–99)
Potassium: 3.3 mmol/L — ABNORMAL LOW (ref 3.5–5.2)
Sodium: 142 mmol/L (ref 134–144)
eGFR: 53 mL/min/{1.73_m2} — ABNORMAL LOW (ref >59)

## 2020-08-04 LAB — LIPID PANEL
Chol/HDL Ratio: 4.4 ratio (ref 0.0–4.4)
Cholesterol, Total: 158 mg/dL (ref 100–199)
HDL: 36 mg/dL — ABNORMAL LOW (ref >39)
LDL Chol Calc (NIH): 93 mg/dL (ref 0–99)
Triglycerides: 166 mg/dL — ABNORMAL HIGH (ref 0–149)
VLDL Cholesterol Cal: 29 mg/dL (ref 5–40)

## 2020-08-04 LAB — TSH: TSH: 0.08 u[IU]/mL — ABNORMAL LOW (ref 0.450–4.500)

## 2020-08-04 MED ORDER — FISH OIL 1000 MG PO CAPS: 1.0000 | ORAL_CAPSULE | Freq: Two times a day (BID) | ORAL | 99 refills | Status: DC

## 2020-08-04 MED ORDER — POTASSIUM CHLORIDE CRYS ER 10 MEQ PO TBCR: 10.0000 meq | EXTENDED_RELEASE_TABLET | ORAL | 0 refills | Status: DC

## 2020-08-04 MED ORDER — LEVOTHYROXINE SODIUM 125 MCG PO TABS: 137.0000 ug | ORAL_TABLET | Freq: Every day | ORAL | 1 refills | Status: DC

## 2020-08-04 NOTE — Telephone Encounter (Signed)
Test results discussed.  TSH low. Plan to reduce Synthroid to 125 cmg qd from 137. F/U in 4-8 weeks for repeat test. She agreed with the plan.  Mild AKI and mild hypokalemia. May be due to HCTZ although she is also on Triamtere.  Plan to supplement with KDUR 10 mEQ QOD. F/U in 1 week for repeat lab. Appointment offer, but she prefers to call back to reschedule.  Future lab order placed.  Triglyceride elevated. Recommended continue Simvastatin 40 mg qd and add Fish oil 1 tab BID for now.  She agreed with the plan.

## 2020-08-19 ENCOUNTER — Encounter: Payer: Self-pay | Admitting: Pharmacist

## 2020-08-19 ENCOUNTER — Ambulatory Visit (INDEPENDENT_AMBULATORY_CARE_PROVIDER_SITE_OTHER): Payer: 59 | Admitting: Pharmacist

## 2020-08-19 ENCOUNTER — Ambulatory Visit: Payer: 59 | Admitting: Licensed Clinical Social Worker

## 2020-08-19 ENCOUNTER — Other Ambulatory Visit: Payer: 59

## 2020-08-19 ENCOUNTER — Other Ambulatory Visit: Payer: Self-pay

## 2020-08-19 DIAGNOSIS — R0600 Dyspnea, unspecified: Secondary | ICD-10-CM

## 2020-08-19 DIAGNOSIS — Z7689 Persons encountering health services in other specified circumstances: Secondary | ICD-10-CM

## 2020-08-19 DIAGNOSIS — E876 Hypokalemia: Secondary | ICD-10-CM

## 2020-08-19 DIAGNOSIS — N179 Acute kidney failure, unspecified: Secondary | ICD-10-CM

## 2020-08-19 MED ORDER — ADVAIR HFA 115-21 MCG/ACT IN AERO: 2.0000 | INHALATION_SPRAY | Freq: Two times a day (BID) | RESPIRATORY_TRACT | 12 refills | Status: DC

## 2020-08-19 NOTE — Patient Instructions (Signed)
   The Care team member will call you to reschedule your phone appointment with Britt Bottom, [facility]  [phone]

## 2020-08-19 NOTE — Progress Notes (Signed)
Reviewed: I agree with Dr. Koval's documentation and management. 

## 2020-08-19 NOTE — Progress Notes (Signed)
   S:    Patient arrives ambulating independently and in good spirits but fatigued.    Presents for follow-up of breathing following asthma exacerbation .    Patient was referred and last seen by Primary Care Provider on June 7th, 2022.   Patient reports breathing has improved but not at goal.   Medication adherence reported below average due to running out of Advair Current asthma medications: Advair HFA 49-21 mcg/act 2 puffs taking 1-2 times per day.  Rescue inhaler use frequency: <1 times per week Patient exacerbation hx: asthma exacerbation ~3 weeks prior  O: Physical Exam Constitutional:      Appearance: Normal appearance.  Pulmonary:     Effort: Pulmonary effort is normal.  Neurological:     Mental Status: She is alert.  Psychiatric:        Mood and Affect: Mood normal.        Thought Content: Thought content normal.   Review of Systems  Respiratory:  Negative for cough, sputum production, shortness of breath and wheezing.    Vitals:   08/19/20 1532  BP: 135/80  Pulse: 74  SpO2: 95%    A/P: Patient has been experiencing shortness of breath for one year that has improved since starting fluticasone-salmeterol 45-21 mcg/act (Advair HFA) 2 puffs 1-2 times per day about 3 weeks ago. Patient reports taking albuterol 108 mcg/act (Ventolin) inhaler <1 time per week since starting Advair HFA. Patient with good to fair inhaler technique. Patient medication adherence suboptimal due to running out medication and only taking Advair HFA inhaler once daily.  - CONTINUE fluticasone-salmeterol 45-21 mcg/act (Advair HFA) 2 puffs twice daily until it runs out - START fluticasone-salmeterol 115-21 mcg/act (Advair HFA) 2 puffs twice daily when lower strength runs out - Educated patient on purpose, proper use, potential adverse effects including risk of esophageal candidiasis and need to rinse mouth after each use.  Pt verbalized understanding of education.   Consider switch to Symbicort for  maintenance and rescue inhaler and/or add on montelukast at next visit if symptoms not controlled   Written pt instructions provided.  F/U Clinic visit in 6 weeks to assess inhaler technique, adherence, and symptom control.    Total time in face to face counseling 30 minutes.  Patient seen with Romilda Garret, PharmD - PGY-1 Resident.

## 2020-08-19 NOTE — Chronic Care Management (AMB) (Signed)
    Clinical Social Work  Care Management   Phone Outreach    08/19/2020 Name: MAKAILYN MCCORMICK MRN: [id number] DOB: October 14, 1955  Aissatou A Bechtel is a 65 y.o. year old female who is a primary care patient of [name], [name], MD .   F/U phone appointment scheduled with CCM RN today to assess needs, and progress with care plan goals.  CCM LCSW providing coverage.  Patient reports no immediate needs or concerns for CCM RN today. Reminded patient of upcoming appointments.    Offered to reschedule phone appointment with  CCM RN, Tressia Miners.  Patient would like to be called at a later dated to schedule the phone appointment. She is on her way to another medical appointment.    Plan:Will route chart to Care Guide to see if patient would like to reschedule phone appointment   Review of patient status, including review of consultants reports, relevant laboratory and other test results, and collaboration with appropriate care team members and the patient's provider was performed as part of comprehensive patient evaluation and provision of care management services.     Casimer Lanius, [place] / [place]   [phone] 2:02 PM

## 2020-08-19 NOTE — Assessment & Plan Note (Signed)
Patient has been experiencing shortness of breath for one year that has improved since starting fluticasone-salmeterol 45-21 mcg/act (Advair HFA) 2 puffs 1-2 times per day about 3 weeks ago. Patient reports taking albuterol 108 mcg/act (Ventolin) inhaler <1 time per week since starting Advair HFA. Patient with good to fair inhaler technique. Patient medication adherence suboptimal due to running out medication and only taking Advair HFA inhaler once daily.  - CONTINUE fluticasone-salmeterol 45-21 mcg/act (Advair HFA) 2 puffs twice daily until it runs out - START fluticasone-salmeterol 115-21 mcg/act (Advair HFA) 2 puffs twice daily when lower strength runs out - Educated patient on purpose, proper use, potential adverse effects including risk of esophageal candidiasis and need to rinse mouth after each use.  Pt verbalized understanding of education.   Consider switch to Symbicort for maintenance and rescue inhaler and/or add on montelukast at next visit if symptoms not controlled

## 2020-08-19 NOTE — Patient Instructions (Addendum)
It was great meeting you today!  Your breathing is still not quite at your goal.  - CONTINUE Advair 2 puffs twice daily until it runs out and pick up the higher strength of Advair at your pharmacy - CONTINUE albuterol but only take as needed for shortness of breath or wheezing  We will follow-up with you in six weeks.

## 2020-08-20 ENCOUNTER — Telehealth: Payer: Self-pay | Admitting: Pharmacist

## 2020-08-20 LAB — BASIC METABOLIC PANEL
BUN/Creatinine Ratio: 13 (ref 12–28)
BUN: 14 mg/dL (ref 8–27)
CO2: 26 mmol/L (ref 20–29)
Calcium: 9 mg/dL (ref 8.7–10.3)
Chloride: 97 mmol/L (ref 96–106)
Creatinine, Ser: 1.06 mg/dL — ABNORMAL HIGH (ref 0.57–1.00)
Glucose: 90 mg/dL (ref 65–99)
Potassium: 3.1 mmol/L — ABNORMAL LOW (ref 3.5–5.2)
Sodium: 139 mmol/L (ref 134–144)
eGFR: 59 mL/min/{1.73_m2} — ABNORMAL LOW (ref >59)

## 2020-08-20 MED ORDER — POTASSIUM CHLORIDE CRYS ER 20 MEQ PO TBCR: 20.0000 meq | EXTENDED_RELEASE_TABLET | Freq: Every day | ORAL | 0 refills | Status: DC

## 2020-08-20 MED ORDER — SPIRONOLACTONE 25 MG PO TABS: 25.0000 mg | ORAL_TABLET | Freq: Every day | ORAL | 1 refills | Status: DC

## 2020-08-20 NOTE — Telephone Encounter (Signed)
Noted and agree. 

## 2020-08-20 NOTE — Telephone Encounter (Signed)
-----   Message from [name], MD sent at 08/20/2020  8:40 AM EDT -----  ----- Message ----- From: Lavone Neri Lab Results In Sent: 08/20/2020   8:19 AM EDT To: [name], MD

## 2020-08-20 NOTE — Telephone Encounter (Addendum)
Left voicemail for patient to call back to discuss BMET results. Potassium is 3.1 which is down from 3.3 two weeks prior after taking Klor-Con 10 mEq every other day for 6 days. Of note, patient is taking triamterene-HCTZ 37.5-25 mg (Maxzide) 1 tablet by mouth once daily.   After discussing with Dr. Andria Frames, will stop Maxzide and switch to aldosterone antagonist. Eplerenone preferred due to fewer side effects such as gynecomastia however is not on patient's formulary and cash price ~$80. Will send in for spironolactone 25 mg once daily and Kdur 20 mEq 1 tablet by mouth x3 days.   Will try again to call patient to counsel on spironolactone side effects such as breast tenderness or changes. Will follow-up BMET at next visit with Dr. [name] July 8.    Patient returned call at 10:52 AM Phone communication handled by Romilda Garret, PharmD - PGY-1 Resident.   Shared result of lab work.  Patient educated on purpose, proper use and potential adverse effects of gynecomastia including breat tissue changes sx..  Following instruction patient verbalized understanding of treatment plan.

## 2020-09-03 ENCOUNTER — Ambulatory Visit (INDEPENDENT_AMBULATORY_CARE_PROVIDER_SITE_OTHER): Payer: 59 | Admitting: Family Medicine

## 2020-09-03 ENCOUNTER — Other Ambulatory Visit: Payer: Self-pay

## 2020-09-03 ENCOUNTER — Encounter: Payer: Self-pay | Admitting: Family Medicine

## 2020-09-03 VITALS — BP 136/80 | HR 76 | Ht 64.0 in | Wt 266.4 lb

## 2020-09-03 DIAGNOSIS — F331 Major depressive disorder, recurrent, moderate: Secondary | ICD-10-CM | POA: Diagnosis not present

## 2020-09-03 DIAGNOSIS — E039 Hypothyroidism, unspecified: Secondary | ICD-10-CM | POA: Diagnosis not present

## 2020-09-03 DIAGNOSIS — I10 Essential (primary) hypertension: Secondary | ICD-10-CM

## 2020-09-03 DIAGNOSIS — E876 Hypokalemia: Secondary | ICD-10-CM

## 2020-09-03 DIAGNOSIS — K219 Gastro-esophageal reflux disease without esophagitis: Secondary | ICD-10-CM

## 2020-09-03 MED ORDER — FAMOTIDINE 20 MG PO TABS: 20.0000 mg | ORAL_TABLET | Freq: Every day | ORAL | 1 refills | Status: DC | PRN

## 2020-09-03 NOTE — Assessment & Plan Note (Signed)
Discussed use of Pepcid prn. She agreed with the plan.

## 2020-09-03 NOTE — Progress Notes (Signed)
    SUBJECTIVE:   CHIEF COMPLAINT / HPI:   HTN/Hypokalemia: maxzide d/ced and switched to Aldactone 25 mg qd due to persistent hypokalemia. Compliant with her new med. She is here for lab check up. On ASA, but no hx of MI or CVA.  Hypothyroidism: She started a lower dose of her Synthroid of 125 mcg QD. Here for recheck.  Obese:Working mostly on diet control. No new concerns.   GERD: On Pepcid daily.    PERTINENT  PMH / PSH: PMX reviewed  OBJECTIVE:   BP 136/80   Pulse 76   Ht 5\' 4"  (1.626 m)   Wt 266 lb 6.4 oz (120.8 kg)   SpO2 95%   BMI 45.73 kg/m   Physical Exam Vitals and nursing note reviewed.  Constitutional:      Appearance: She is obese.  Cardiovascular:     Rate and Rhythm: Normal rate and regular rhythm.     Heart sounds: Normal heart sounds. No murmur heard. Pulmonary:     Effort: Pulmonary effort is normal. No respiratory distress.     Breath sounds: Normal breath sounds. No wheezing.  Abdominal:     General: Bowel sounds are normal. There is no distension.     Palpations: Abdomen is soft. There is no mass.     Tenderness: There is no abdominal tenderness.  Musculoskeletal:     Right lower leg: No edema.     Left lower leg: No edema.     ASSESSMENT/PLAN:   HYPERTENSION, BENIGN SYSTEMIC BP looks good on her current regimen. I advised her to D/C ASA since there is no secondary indication for this agent. She agreed with the plan. Bmet checked. Continue Aldactone 25 mg qd.   Hypokalemia S/P supplementation. Bmet to recheck. I will contact her with the result soon.   Hypothyroidism TSH rechecked today. Will contact her soon with her result.   Major depressive disorder, recurrent episode ([place]) Weight trending down. I commended her on this. Continue diet and exercise as tolerated for weight management.   Esophageal reflux Discussed use of Pepcid prn. She agreed with the plan.    NB: She stated she got her Shingrix at [facility]. I called  and confirmed via her pharmacy. Record updated.   [name], MD [place]

## 2020-09-03 NOTE — Patient Instructions (Signed)
Hypokalemia Hypokalemia means that the amount of potassium in the blood is lower than normal. Potassium is a chemical (electrolyte) that helps regulate the amount of fluid in the body. It also stimulates muscle tightening (contraction) and helps nerves work properly. Normally, most of the body's potassium is inside cells, and only a very small amount is in the blood. Because the amount in the blood is so small, minorchanges to potassium levels in the blood can be life-threatening. What are the causes? This condition may be caused by: Antibiotic medicine. Diarrhea or vomiting. Taking too much of a medicine that helps you have a bowel movement (laxative) can cause diarrhea and lead to hypokalemia. Chronic kidney disease (CKD). Medicines that help the body get rid of excess fluid (diuretics). Eating disorders, such as bulimia. Low magnesium levels in the body. Sweating a lot. What are the signs or symptoms? Symptoms of this condition include: Weakness. Constipation. Fatigue. Muscle cramps. Mental confusion. Skipped heartbeats or irregular heartbeat (palpitations). Tingling or numbness. How is this diagnosed? This condition is diagnosed with a blood test. How is this treated? This condition may be treated by: Taking potassium supplements by mouth. Adjusting the medicines that you take. Eating more foods that contain a lot of potassium. If your potassium level is very low, you may need to get potassium through anIV and be monitored in the hospital. Follow these instructions at home:  Take over-the-counter and prescription medicines only as told by your health care provider. This includes vitamins and supplements. Eat a healthy diet. A healthy diet includes fresh fruits and vegetables, whole grains, healthy fats, and lean proteins. If instructed, eat more foods that contain a lot of potassium. This includes: Nuts, such as peanuts and pistachios. Seeds, such as sunflower seeds and pumpkin  seeds. Peas, lentils, and lima beans. Whole grain and bran cereals and breads. Fresh fruits and vegetables, such as apricots, avocado, bananas, cantaloupe, kiwi, oranges, tomatoes, asparagus, and potatoes. Orange juice. Tomato juice. Red meats. Yogurt. Keep all follow-up visits as told by your health care provider. This is important. Contact a health care provider if you: Have weakness that gets worse. Feel your heart pounding or racing. Vomit. Have diarrhea. Have diabetes (diabetes mellitus) and you have trouble keeping your blood sugar (glucose) in your target range. Get help right away if you: Have chest pain. Have shortness of breath. Have vomiting or diarrhea that lasts for more than 2 days. Faint. Summary Hypokalemia means that the amount of potassium in the blood is lower than normal. This condition is diagnosed with a blood test. Hypokalemia may be treated by taking potassium supplements, adjusting the medicines that you take, or eating more foods that are high in potassium. If your potassium level is very low, you may need to get potassium through an IV and be monitored in the hospital. This information is not intended to replace advice given to you by your health care provider. Make sure you discuss any questions you have with your healthcare provider. Document Revised: 09/26/2017 Document Reviewed: 09/26/2017 Elsevier Patient Education  [place].

## 2020-09-03 NOTE — Assessment & Plan Note (Signed)
TSH rechecked today. Will contact her soon with her result.

## 2020-09-03 NOTE — Assessment & Plan Note (Signed)
BP looks good on her current regimen. I advised her to D/C ASA since there is no secondary indication for this agent. She agreed with the plan. Bmet checked. Continue Aldactone 25 mg qd.

## 2020-09-03 NOTE — Assessment & Plan Note (Signed)
Weight trending down. I commended her on this. Continue diet and exercise as tolerated for weight management.

## 2020-09-03 NOTE — Assessment & Plan Note (Signed)
S/P supplementation. Bmet to recheck. I will contact her with the result soon.

## 2020-09-04 ENCOUNTER — Telehealth: Payer: Self-pay | Admitting: Family Medicine

## 2020-09-04 LAB — BASIC METABOLIC PANEL
BUN/Creatinine Ratio: 14 (ref 12–28)
BUN: 15 mg/dL (ref 8–27)
CO2: 23 mmol/L (ref 20–29)
Calcium: 9.3 mg/dL (ref 8.7–10.3)
Chloride: 102 mmol/L (ref 96–106)
Creatinine, Ser: 1.04 mg/dL — ABNORMAL HIGH (ref 0.57–1.00)
Glucose: 80 mg/dL (ref 65–99)
Potassium: 3.7 mmol/L (ref 3.5–5.2)
Sodium: 141 mmol/L (ref 134–144)
eGFR: 60 mL/min/{1.73_m2} (ref >59)

## 2020-09-04 LAB — TSH: TSH: 0.484 u[IU]/mL (ref 0.450–4.500)

## 2020-09-04 NOTE — Telephone Encounter (Signed)
Result discussed.  Potassium is back to normal. Creatinine improved. TSH back to normal, may recheck in 6 months.

## 2020-09-07 ENCOUNTER — Encounter: Payer: Self-pay | Admitting: Family Medicine

## 2020-09-07 DIAGNOSIS — I77810 Thoracic aortic ectasia: Secondary | ICD-10-CM | POA: Insufficient documentation

## 2020-09-14 ENCOUNTER — Telehealth: Payer: Self-pay | Admitting: *Deleted

## 2020-09-14 NOTE — Chronic Care Management (AMB) (Signed)
  Care Management   Note  09/14/2020 Name: [name] MRN: [id number] DOB: 1955-04-27  Andrea [name] is a 65 y.o. year old female who is a primary care patient of [name], MD and is actively engaged with the care management team. I reached out to [place] by phone today to assist with scheduling a follow up visit with the RN Case Manager  Follow up plan: Unsuccessful telephone outreach attempt made. A HIPAA compliant phone message was left for the patient providing contact information and requesting a return call.  The care management team will reach out to the patient again over the next 7 days.  If patient returns call to provider office, please advise to call [place] at [phone].  [place] [facility]

## 2020-09-15 NOTE — Chronic Care Management (AMB) (Signed)
  Care Management   Note  09/15/2020 Name: Andrea [name] MRN: [id number] DOB: Oct 28, 1955  [name] is a 65 y.o. year old female who is a primary care patient of [name], MD and is actively engaged with the care management team. I reached out to [place] by phone today to assist with re-scheduling a follow up visit with the RN Case Manager  Follow up plan: Telephone appointment with care management team member scheduled for:09/30/2020  [place] [facility]

## 2020-09-16 ENCOUNTER — Other Ambulatory Visit: Payer: Self-pay | Admitting: Family Medicine

## 2020-09-23 ENCOUNTER — Encounter: Payer: Self-pay | Admitting: Pharmacist

## 2020-09-23 ENCOUNTER — Other Ambulatory Visit: Payer: Self-pay

## 2020-09-23 ENCOUNTER — Ambulatory Visit (INDEPENDENT_AMBULATORY_CARE_PROVIDER_SITE_OTHER): Payer: 59 | Admitting: Pharmacist

## 2020-09-23 DIAGNOSIS — I10 Essential (primary) hypertension: Secondary | ICD-10-CM | POA: Diagnosis not present

## 2020-09-23 DIAGNOSIS — R0600 Dyspnea, unspecified: Secondary | ICD-10-CM

## 2020-09-23 MED ORDER — SPIRONOLACTONE 25 MG PO TABS: 12.5000 mg | ORAL_TABLET | Freq: Every day | ORAL | 2 refills | Status: DC

## 2020-09-23 NOTE — Progress Notes (Addendum)
S:    Patient arrives ambulating independently and in good spirits but fatigued. Presents for follow-up of breathing following asthma exacerbation. Patient was referred and last seen by Primary Care Provider on June 7th, 2022.   At last visit with pharmacy clinic on 08/19/20, Advair was increased to 115/21 mcg/act 2 puffs BID and triamterene-HCTZ was switched to spironolactone 25 mg due to hypokalemia. Potassium improved to normal range after ~2 weeks of starting spironolactone. Today, patient reports breathing has improved since last visit. Reports she has been taking Advair every morning but is only taking the evening dose about two times per week. Has required albuterol inhaler only once since last visit. Reports that her BP wrist cuff is no longer working so she has not been able to check at home. Reports dizziness that worsens with standing and extended activity like walking and is made worse by the heat. Also reports feeling numbness in her arm occasionally. Denies chest pain but says she feels some pressure when she is laying down that resolves when she sits up.   Medication adherence reported below average due to forgetting evening dose of Advair Current asthma medications: Advair HFA 115-21 mcg/act 2 puffs taking 1-2 times per day.  Rescue inhaler use frequency: <1 time in past month Patient exacerbation hx: none since last visit  O: Physical Exam Constitutional:      Appearance: Normal appearance.  Pulmonary:     Effort: Pulmonary effort is normal.  Neurological:     Mental Status: She is alert.  Psychiatric:        Mood and Affect: Mood normal.        Thought Content: Thought content normal.   Review of Systems  Respiratory:  Negative for cough, sputum production, shortness of breath and wheezing.   Neurological:  Positive for dizziness.       Numbness in left arm  All other systems reviewed and are negative.  Vitals:   09/23/20 1330  BP: 112/82  Pulse: 80  SpO2: 97%     A/P: Patient has been experiencing shortness of breath for one year that has improved since increasing fluticasone-salmeterol 115-21 mcg/act (Advair HFA) 2 puffs 1-2 times per day about 1 month ago. Use of albuterol has decreased to only one use since last visit 1 month ago. Patient medication adherence suboptimal due to only taking Advair HFA inhaler once daily on most days of the week due to forgetfulness.  - CONTINUE fluticasone-salmeterol 115-21 mcg/act (Advair HFA) 2 puffs twice daily - Encouraged improved adherence to take Advair twice daily every day. Suggested use of alarms or incorporating her inhaler into her evening medication routine to improve adherence as this will help to improve her asthma control.  - Educated patient on purpose, proper use, potential adverse effects including risk of esophageal candidiasis and need to rinse mouth after each use.  Pt verbalized understanding of education.    Her blood pressure has improved since switching triamterene-HCTZ to spironolactone at last visit with reading in office today of 112/82. Patient does report dizziness in office and at home. 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 Will decrease dose due to symptomatic hypotension.  - Decrease spironolactone to 12.5 mg (half tablet) once daily - Checking BMET today to monitor potassium  Written pt instructions provided.  F/U PCP visit in 6 weeks to assess BP and symptoms reported including arm numbness and dizziness.     Total time  in face to face counseling 30 minutes.  Patient seen with Rebbeca Paul, PharmD - PGY2 Pharmacy Resident.   BMET result - returned 7/29 Stable - unchanged Potassium 3.6 on spironolactone '25mg'$  daily.  As plans is to decrease dose of spironolactone, consider follow-up BMET at next PCP visit.

## 2020-09-23 NOTE — Assessment & Plan Note (Signed)
Her blood pressure has improved since switching triamterene-HCTZ to spironolactone at last visit with reading in office today of 112/82. Patient does report dizziness in office and at home. 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 Will decrease dose due to symptomatic hypotension.  - Decrease spironolactone to 12.5 mg (half tablet) once daily - Checking BMET today to monitor potassium

## 2020-09-23 NOTE — Assessment & Plan Note (Signed)
Patient has been experiencing shortness of breath for one year that has improved since increasing fluticasone-salmeterol 115-21 mcg/act (Advair HFA) 2 puffs 1-2 times per day about 1 month ago. Use of albuterol has decreased to only one use since last visit 1 month ago. Patient medication adherence suboptimal due to only taking Advair HFA inhaler once daily on most days of the week due to forgetfulness.  - CONTINUE fluticasone-salmeterol 115-21 mcg/act (Advair HFA) 2 puffs twice daily - Encouraged improved adherence to take Advair twice daily every day. Suggested use of alarms or incorporating her inhaler into her evening medication routine to improve adherence as this will help to improve her asthma control.  - Educated patient on purpose, proper use, potential adverse effects including risk of esophageal candidiasis and need to rinse mouth after each use.  Pt verbalized understanding of education.

## 2020-09-23 NOTE — Patient Instructions (Signed)
Nice to see you today!!  DECREASE spironolactone to 12.5 mg (half of one tablet) daily  Continue Advair 2 puffs TWICE daily  Follow up with Dr. [name] in ~6 weeks for blood pressure check and to discuss symptoms of arm numbness and dizziness

## 2020-09-24 LAB — BASIC METABOLIC PANEL
BUN/Creatinine Ratio: 15 (ref 12–28)
BUN: 16 mg/dL (ref 8–27)
CO2: 24 mmol/L (ref 20–29)
Calcium: 9.2 mg/dL (ref 8.7–10.3)
Chloride: 101 mmol/L (ref 96–106)
Creatinine, Ser: 1.06 mg/dL — ABNORMAL HIGH (ref 0.57–1.00)
Glucose: 86 mg/dL (ref 65–99)
Potassium: 3.6 mmol/L (ref 3.5–5.2)
Sodium: 141 mmol/L (ref 134–144)
eGFR: 59 mL/min/{1.73_m2} — ABNORMAL LOW (ref >59)

## 2020-09-24 NOTE — Progress Notes (Signed)
Reviewed: I agree with Dr. Koval's documentation and management. 

## 2020-09-30 ENCOUNTER — Ambulatory Visit: Payer: 59

## 2020-09-30 NOTE — Chronic Care Management (AMB) (Signed)
Chronic Care Management   CCM RN Visit Note  09/30/2020 Name: Andrea [name] MRN: [id number] DOB: 1955/07/06  Subjective: Andrea [name] is a 65 y.o. year old female who is a primary care patient of [name], MD. The care management team was consulted for assistance with disease management and care coordination needs.    Engaged with patient by telephone for follow up visit in response to provider referral for case management and/or care coordination services.   Consent to Services:  The patient was given information about Chronic Care Management services, agreed to services, and gave verbal consent prior to initiation of services.  Please see initial visit note for detailed documentation.   Patient agreed to services and verbal consent obtained.    Assessment: Patient is making progress with her BP.  She is taking a new medications that seems to be helping . See Care Plan below for interventions and patient self-care actives. Follow up Plan: Patient would like continued follow-up.  CCM RNCM will outreach the patient within the next 4 weeks.  Patient will call office if needed prior to next encounter Review of patient past medical history, allergies, medications, health status, including review of consultants reports, laboratory and other test data, was performed as part of comprehensive evaluation and provision of chronic care management services.   SDOH (Social Determinants of Health) assessments and interventions performed:    CCM Care Plan  No Known Allergies  Outpatient Encounter Medications as of 09/30/2020  Medication Sig Note   albuterol (VENTOLIN HFA) 108 (90 Base) MCG/ACT inhaler Inhale 2 puffs into the lungs every 6 (six) hours as needed for wheezing or shortness of breath. 08/19/2020: <1 time per week   Blood Pressure Monitoring (BLOOD PRESSURE MONITOR/L CUFF) MISC Check blood pressure 3 times per week.    carvedilol (COREG) 6.25 MG tablet TAKE 1 TABLET BY  MOUTH TWICE DAILY WITH A MEAL    famotidine (PEPCID) 20 MG tablet Take 1 tablet (20 mg total) by mouth daily as needed for heartburn or indigestion.    fluticasone-salmeterol (ADVAIR HFA) 115-21 MCG/ACT inhaler Inhale 2 puffs into the lungs 2 (two) times daily.    levothyroxine (EUTHYROX) 125 MCG tablet Take 1 tablet (125 mcg total) by mouth daily before breakfast.    loratadine (CLARITIN) 10 MG tablet Take 1 tablet (10 mg total) by mouth daily.    Omega-3 Fatty Acids (FISH OIL) 1000 MG CAPS Take 1 capsule (1,000 mg total) by mouth in the morning and at bedtime. (Patient taking differently: Take 1 capsule by mouth daily.)    sertraline (ZOLOFT) 50 MG tablet Take 3 tablets by mouth once daily    simvastatin (ZOCOR) 40 MG tablet TAKE 1 TABLET BY MOUTH AT BEDTIME    spironolactone (ALDACTONE) 25 MG tablet Take 0.5 tablets (12.5 mg total) by mouth at bedtime.    Vitamin D, Cholecalciferol, 50 MCG (2000 UT) CAPS Take 2,000 Units by mouth daily.    fluticasone (FLONASE) 50 MCG/ACT nasal spray Place 1 spray into both nostrils daily. 1 spray in each nostril every day (Patient not taking: Reported on 09/30/2020)    No facility-administered encounter medications on file as of 09/30/2020.    Patient Active Problem List   Diagnosis Date Noted   Thoracic aortic ectasia ([place]) 09/07/2020   Congestive heart failure ([place]) 08/03/2020   Dyspnea 07/22/2020   Esophageal reflux 08/14/2017   Vitamin D deficiency 01/12/2017   Hypokalemia 05/27/2014   Cataract 09/19/2013   Hypothyroidism 04/26/2006  Hyperlipidemia 04/26/2006   Morbid obesity ([place]) 04/26/2006   Major depressive disorder, recurrent episode ([place]) 04/26/2006   HYPERTENSION, BENIGN SYSTEMIC 04/26/2006    Conditions to be addressed/monitored:HTN  Care Plan : RN Case Management  Updates made by Lazaro Arms, RN since 09/30/2020 12:00 AM     Problem: Hypertension Management   Priority: High  Onset Date: 01/06/2019  Note:   Current Barriers:   Knowledge Deficits related to basic understanding of hypertension pathophysiology and self-care management   Nurse Case Manager Clinical Goal(s):    Over the next 90 days, patient will demonstrate improved adherence to prescribed treatment plan for hypertension as evidenced by taking all medications as prescribed, monitoring and recording blood pressure as directed, adhering to low sodium/DASH diet   Interventions:  Evaluation of current treatment plan related to hypertension self-management and patient's adherence to plan as established by provider. Provided education to patient re: stroke prevention, s/s of heart attack and stroke, DASH diet, complications of uncontrolled blood pressure Reviewed medications with patient and discussed importance of compliance Discussed plans with patient for ongoing care management follow up and provided patient with direct contact information for care management team Advised patient, providing education and rationale, to monitor blood pressure daily and record, calling PCP for findings outside established parameters. -  Reviewed scheduled/upcoming provider appointments including:  healthy diet promoted -still monitor sodium in her food choices,  medication adherence assessment completed patient is taking her medications as prescribed  support and encouragement provided-  Spoke with the patient and she is taking her medications as prescribed. She states that she has been started on new medication spironolactone 25 mg taking 0.5 mg by mouth at bedtime.  She states that her blood pressure has come down. When last checked on 09/23/20 with her visit to Nicholas Lose PharmD it was 112/82.  She has not been able to check at home because she is going to get a new Bp monitor.  She states that she plans to have a visit with Dr. [name] in September. Advised the patient to continue with her regimen of taking her medications, monitoring her diet, and trying to exercise as  tolerated.  Also to get her BP when she can to keep track of her BP.  If she needs assistance let me know.   Patient Goals/Self Care Activities:  Self-administers medications as prescribed Attends all scheduled provider appointments Calls provider office for new concerns, questions, or BP outside discussed parameters Checks BP and records as discussed Follows a low sodium diet/DASH diet Find her arm BP monitor and scale and start to record here values agree to work together to make changes ask questions to understand check blood pressure 3 times per week         Lazaro Arms RN, BSN, [place] Phone: [phone] I Fax: [phone]

## 2020-09-30 NOTE — Patient Instructions (Signed)
Visit Information  Andrea [name]  it was nice speaking with you. Please call me directly [phone] if you have questions about the goals we discussed.   Goals Addressed               This Visit's Progress     Monitor My BP (pt-stated)        /Timeframe:  Long-Range Goal Priority:  High Start Date:    01/06/19                         /Expected End Date: 11/26/20       Patient Goals/Self Care Activities:  Self administers medications as prescribed Attends all scheduled provider appointments Calls provider office for new concerns, questions, or BP outside discussed parameters Checks BP and records as discussed Follows a low sodium diet/DASH diet Patient will choose one BP cuff and use it to check her BP        The patient verbalized understanding of instructions, educational materials, and care plan provided today and declined offer to receive copy of patient instructions, educational materials, and care plan.   Follow up Plan: Patient would like continued follow-up.  CCM RNCM will outreach the patient within the next 4 weeks  Patient will call office if needed prior to next encounter  Lazaro Arms, RN  [phone]

## 2020-10-13 ENCOUNTER — Other Ambulatory Visit: Payer: Self-pay | Admitting: Family Medicine

## 2020-10-17 IMAGING — CR DG CHEST 2V
2 series · 2 of 2 positions shown · non-contrast
Comparison: 12/19/2018

CLINICAL DATA: Shortness of breath and wheezing

EXAM:
CHEST - 2 VIEW

[chest pa]
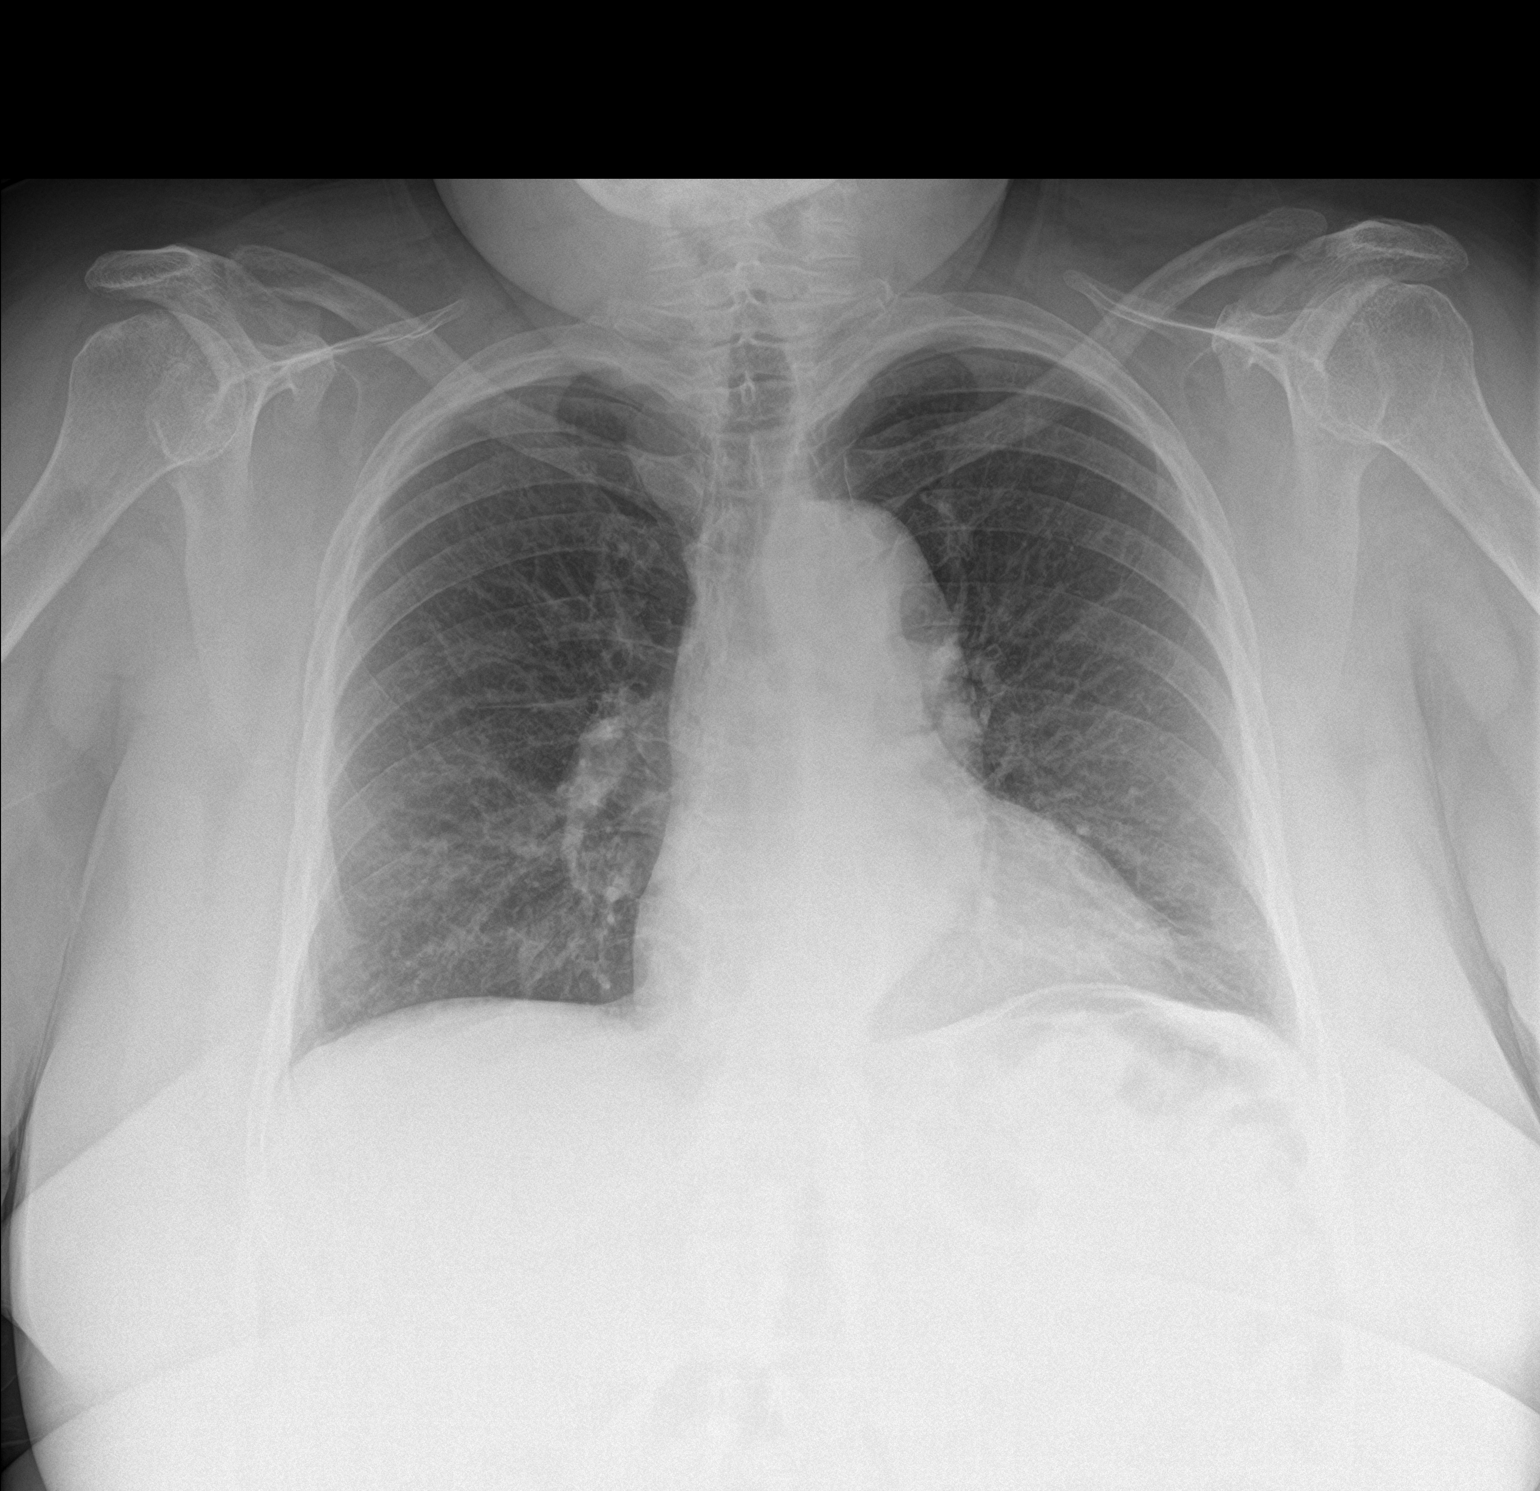

[chest lat]
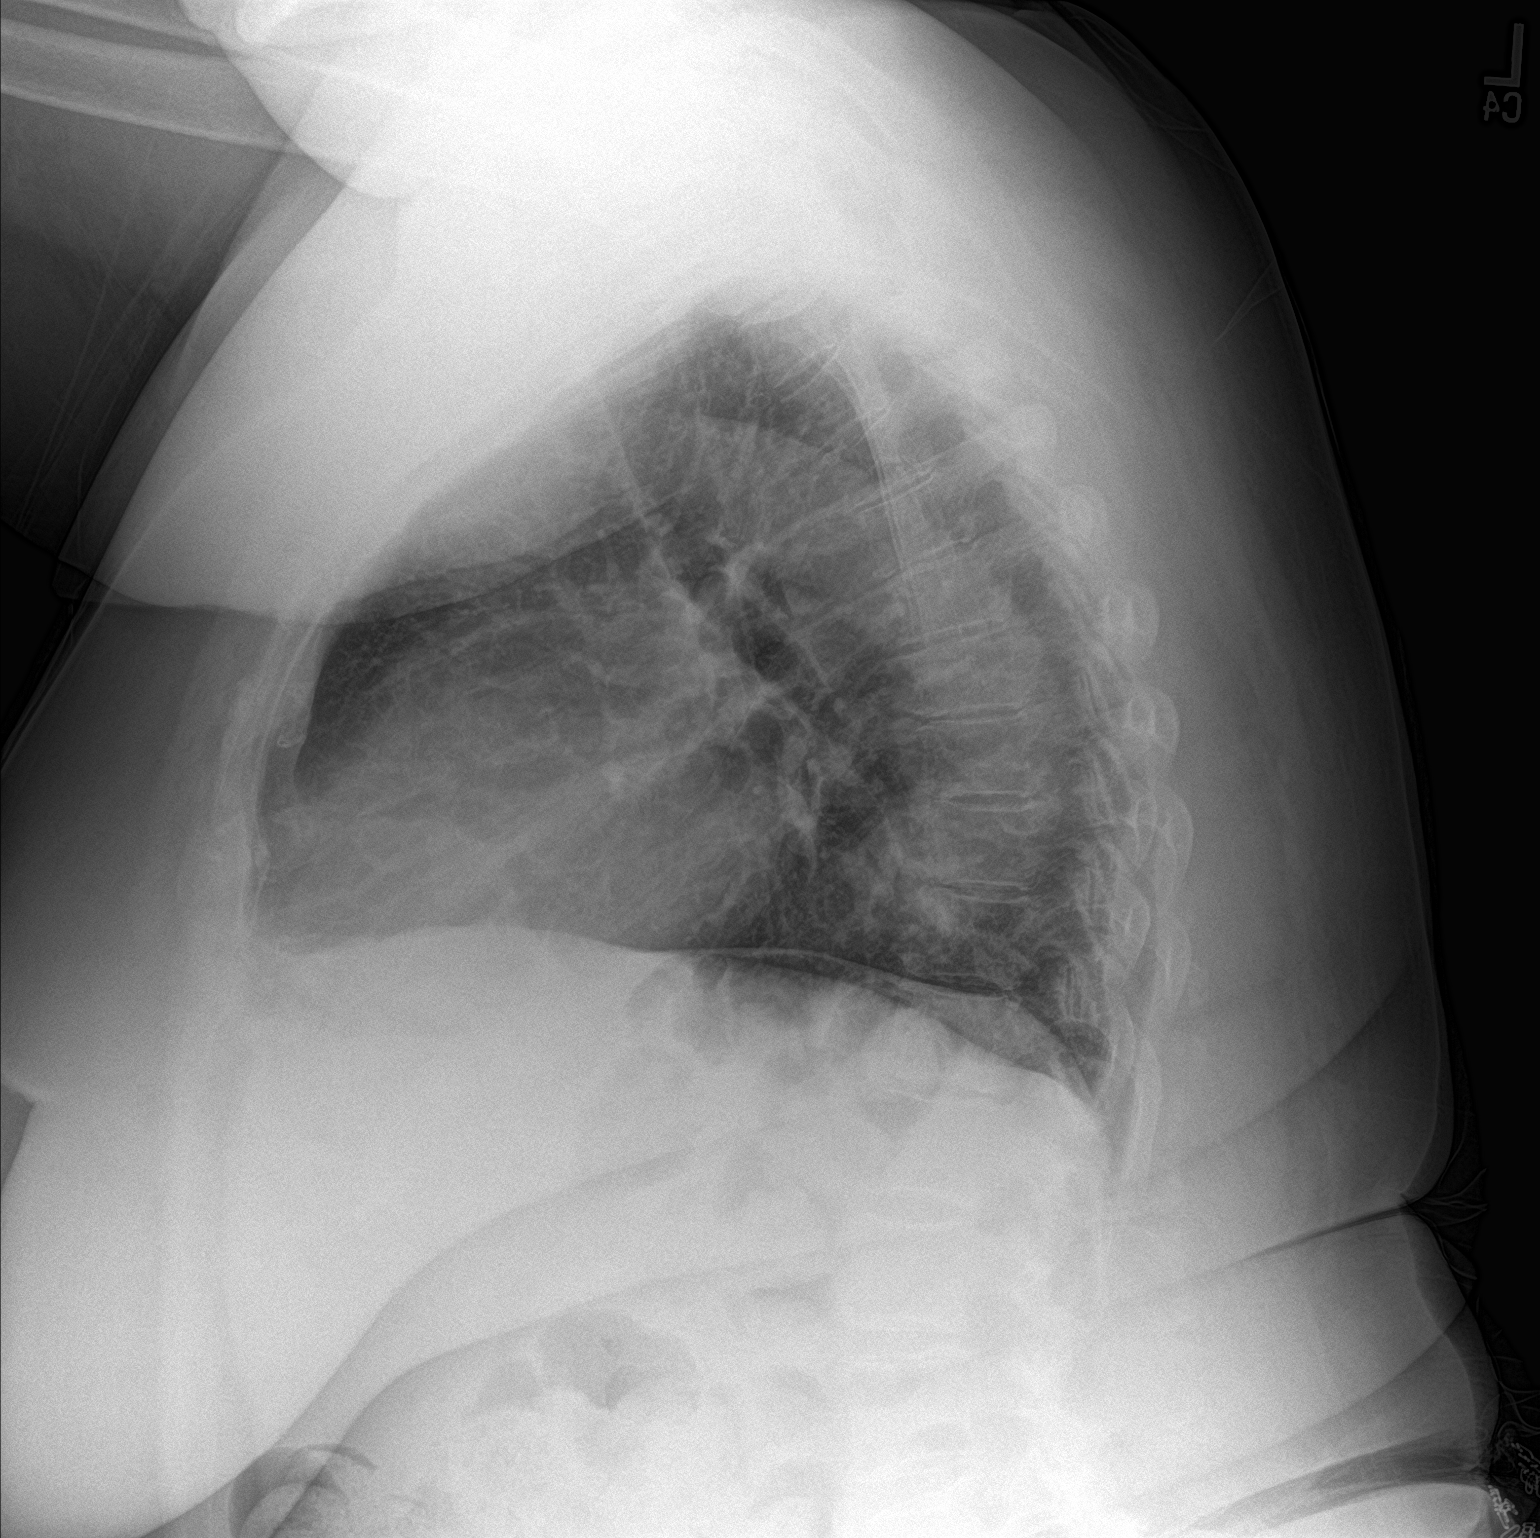

[2 of 2 positions shown; findings below may reference images not displayed]

FINDINGS: The heart size and mediastinal contours are within normal limits.
Both lungs are clear. The visualized skeletal structures are
unremarkable.
IMPRESSION: No active cardiopulmonary disease.

## 2020-10-21 ENCOUNTER — Ambulatory Visit
Admission: RE | Admit: 2020-10-21 | Discharge: 2020-10-21 | Disposition: A | Discharge status: Home / Self Care | Payer: 59 | Source: Ambulatory Visit | Attending: Family Medicine | Admitting: Family Medicine

## 2020-10-21 ENCOUNTER — Other Ambulatory Visit: Payer: Self-pay

## 2020-10-21 ENCOUNTER — Telehealth: Payer: Self-pay | Admitting: Family Medicine

## 2020-10-21 DIAGNOSIS — Z1231 Encounter for screening mammogram for malignant neoplasm of breast: Secondary | ICD-10-CM

## 2020-10-21 NOTE — Telephone Encounter (Signed)
Patient calling regarding this paperwork. She said she forgot to give the correct number to call when forms are complete. When completed, please call [phone] as number in chart is not working at the time.

## 2020-10-21 NOTE — Telephone Encounter (Signed)
[employer] [place] Application form dropped off for at front desk for completion.  Verified that patient section of form has been completed.  Last DOS/WCC with PCP was 09/03/20.  Placed form in team folder to be completed by clinical staff.  Andrea [name]

## 2020-10-21 NOTE — Telephone Encounter (Signed)
Routed message to PCP. Naomi Fitton, CMA  

## 2020-10-25 NOTE — Telephone Encounter (Signed)
Called patient and she voiced understanding.  She will plan to come by on Thursday to sign her part of the form.  Zainah Steven,CMA

## 2020-10-28 ENCOUNTER — Telehealth: Payer: Self-pay

## 2020-10-28 ENCOUNTER — Telehealth: Payer: 59

## 2020-10-28 ENCOUNTER — Telehealth: Payer: Self-pay | Admitting: Family Medicine

## 2020-10-28 NOTE — Telephone Encounter (Signed)
   RN Case Manager Care Management   Phone Outreach    10/28/2020 Name: EMERII ESCANDON MRN: [id number] DOB: December 28, 1955  Shaunice A Cubillas is a 65 y.o. year old female who is a primary care patient of [name], [name], MD .   Telephone outreach was unsuccessful Unable to leave a HIPPA compliant phone message due to phone message stated that the call could not be completed as dialed.  Plan:Will route chart to Care Guide to see if patient would like to reschedule phone appointment   Review of patient status, including review of consultants reports, relevant laboratory and other test results, and collaboration with appropriate care team members and the patient's provider was performed as part of comprehensive patient evaluation and provision of care management services.    Lazaro Arms RN, BSN, [facility] Care Management Coordinator [place] Phone: [phone] I Fax: [phone]

## 2020-10-28 NOTE — Telephone Encounter (Signed)
Patient came in and signed the highlighted portion on the form.  Put in blue folder

## 2020-10-29 ENCOUNTER — Telehealth: Payer: Self-pay | Admitting: *Deleted

## 2020-10-29 NOTE — Chronic Care Management (AMB) (Signed)
  Care Management   Note  10/29/2020 Name: RAMATA GENAO MRN: [id number] DOB: 10-Oct-1955  Kalen A Popal is a 65 y.o. year old female who is a primary care patient of [name], MD and is actively engaged with the care management team. I reached out to [place] by phone today to assist with re-scheduling a follow up visit with the RN Case Manager  Follow up plan: Unsuccessful telephone outreach attempt made. The care management team will reach out to the patient again over the next 7 days. If patient returns call to provider office, please advise to call [place] at [phone].  [place] Management  Direct Dial: [phone]

## 2020-11-03 NOTE — Telephone Encounter (Signed)
I have the forms at my desk. Waiting to see Dr. McDiarmid in clinic to see if he is ok with signing for Dr. [name] since she is on vacation. Salvatore Marvel, CMA

## 2020-11-03 NOTE — Chronic Care Management (AMB) (Signed)
  Care Management   Note  11/03/2020 Name: Andrea [name] MRN: [id number] DOB: [birth year]  Andrea [name] is a 65 y.o. year old female who is a primary care patient of [name], MD and is actively engaged with the care management team. I reached out to [place] by phone today to assist with re-scheduling a follow up visit with the RN Case Manager  Follow up plan: Unsuccessful telephone outreach attempt made. The care management team will reach out to the patient again over the next 7 days. If patient returns call to provider office, please advise to call [place] at [phone].  [place] Management  Direct Dial: [phone]

## 2020-11-04 ENCOUNTER — Telehealth: Payer: Self-pay

## 2020-11-04 NOTE — Progress Notes (Signed)
Patient returned call to [facility] and fu was scheduled for 11/11/20  [place] Management  Direct Dial: [phone]

## 2020-11-04 NOTE — Telephone Encounter (Signed)
Spoke with patient informed that form is ready for pickup and will be at the front desk. Patient understood.  Salvatore Marvel, CMA

## 2020-11-05 NOTE — Telephone Encounter (Signed)
Forms signed and placed up front. Called patient to inform forms are ready. No answer. LVM. Salvatore Marvel, CMA

## 2020-11-11 ENCOUNTER — Ambulatory Visit: Payer: 59

## 2020-11-12 ENCOUNTER — Other Ambulatory Visit (HOSPITAL_COMMUNITY): Payer: Self-pay

## 2020-11-12 NOTE — Patient Instructions (Signed)
Visit Information  Ms. Carmosino  it was nice speaking with you. Please call me directly [phone] if you have questions about the goals we discussed.   Goals Addressed               This Visit's Progress     Monitor My BP (pt-stated)        /Timeframe:  Long-Range Goal Priority:  High Start Date:    01/06/19                         /Expected End Date: 12/27/20     Patient Goals/Self Care Activities:  Self administers medications as prescribed Attends all scheduled provider appointments Calls provider office for new concerns, questions, or BP outside discussed parameters Checks BP and records as discussed Follows a low sodium diet/DASH diet Patient will choose one BP cuff and use it to check her BP       The patient verbalizes understanding of the information and instructions discussed today.  Our next appointment is scheduled for  12/17/20.   Please feel free to call me or the office if you have any questions or concerns.  Lazaro Arms RN, BSN, [facility] Care Management Coordinator [place] Phone: [phone] I Fax: [phone]

## 2020-11-12 NOTE — Chronic Care Management (AMB) (Signed)
Chronic Care Management   CCM RN Visit Note  11/12/2020 Name: Andrea [name] MRN: [id number] DOB: Jul 21, 1955  Subjective: Andrea [name] is a 65 y.o. year old female who is a primary care patient of [name], MD. The care management team was consulted for assistance with disease management and care coordination needs.    Engaged with patient by telephone for follow up visit in response to provider referral for case management and/or care coordination services.   Consent to Services:  The patient was given information about Chronic Care Management services, agreed to services, and gave verbal consent prior to initiation of services.  Please see initial visit note for detailed documentation.   Patient agreed to services and verbal consent obtained.    Assessment:  The patient continues to maintain positive progress with care plan goals.. See Care Plan below for interventions and patient self-care actives. Follow up Plan: Patient would like continued follow-up.  CCM RNCM will outreach the patient within the next 5 weeks.  Patient will call office if needed prior to next encounter  Review of patient past medical history, allergies, medications, health status, including review of consultants reports, laboratory and other test data, was performed as part of comprehensive evaluation and provision of chronic care management services.   SDOH (Social Determinants of Health) assessments and interventions performed:    CCM Care Plan  No Known Allergies  Outpatient Encounter Medications as of 11/11/2020  Medication Sig Note   albuterol (VENTOLIN HFA) 108 (90 Base) MCG/ACT inhaler Inhale 2 puffs into the lungs every 6 (six) hours as needed for wheezing or shortness of breath. 08/19/2020: <1 time per week   Blood Pressure Monitoring (BLOOD PRESSURE MONITOR/L CUFF) MISC Check blood pressure 3 times per week.    carvedilol (COREG) 6.25 MG tablet TAKE 1 TABLET BY MOUTH TWICE DAILY WITH  A MEAL    famotidine (PEPCID) 20 MG tablet Take 1 tablet (20 mg total) by mouth daily as needed for heartburn or indigestion.    fluticasone (FLONASE) 50 MCG/ACT nasal spray Place 1 spray into both nostrils daily. 1 spray in each nostril every day (Patient not taking: Reported on 09/30/2020)    fluticasone-salmeterol (ADVAIR HFA) 115-21 MCG/ACT inhaler Inhale 2 puffs into the lungs 2 (two) times daily.    levothyroxine (SYNTHROID) 125 MCG tablet TAKE 1 TABLET BY MOUTH ONCE DAILY BEFORE BREAKFAST    loratadine (CLARITIN) 10 MG tablet Take 1 tablet (10 mg total) by mouth daily.    Omega-3 Fatty Acids (FISH OIL) 1000 MG CAPS Take 1 capsule (1,000 mg total) by mouth in the morning and at bedtime. (Patient taking differently: Take 1 capsule by mouth daily.)    sertraline (ZOLOFT) 50 MG tablet Take 3 tablets by mouth once daily    simvastatin (ZOCOR) 40 MG tablet TAKE 1 TABLET BY MOUTH AT BEDTIME    spironolactone (ALDACTONE) 25 MG tablet Take 0.5 tablets (12.5 mg total) by mouth at bedtime.    Vitamin D, Cholecalciferol, 50 MCG (2000 UT) CAPS Take 2,000 Units by mouth daily.    No facility-administered encounter medications on file as of 11/11/2020.    Patient Active Problem List   Diagnosis Date Noted   Thoracic aortic ectasia ([place]) 09/07/2020   Congestive heart failure ([place]) 08/03/2020   Dyspnea 07/22/2020   Esophageal reflux 08/14/2017   Vitamin D deficiency 01/12/2017   Hypokalemia 05/27/2014   Cataract 09/19/2013   Hypothyroidism 04/26/2006   Hyperlipidemia 04/26/2006   Morbid obesity ([place]) 04/26/2006  Major depressive disorder, recurrent episode ([place]) 04/26/2006   HYPERTENSION, BENIGN SYSTEMIC 04/26/2006    Conditions to be addressed/monitored:HTN  Care Plan : RN Case Management  Updates made by Lazaro Arms, RN since 11/12/2020 12:00 AM     Problem: Hypertension Management   Priority: High  Onset Date: 01/06/2019  Note:   Current Barriers:  Knowledge Deficits related to basic  understanding of hypertension pathophysiology and self-care management   Nurse Case Manager Clinical Goal(s):    Over the next 90 days, patient will demonstrate improved adherence to prescribed treatment plan for hypertension as evidenced by taking all medications as prescribed, monitoring and recording blood pressure as directed, adhering to low sodium/DASH diet   Interventions:  Evaluation of current treatment plan related to hypertension self-management and patient's adherence to plan as established by provider. Provided education to patient re: stroke prevention, s/s of heart attack and stroke, DASH diet, complications of uncontrolled blood pressure Reviewed medications with patient and discussed importance of compliance Discussed plans with patient for ongoing care management follow up and provided patient with direct contact information for care management team Advised patient, providing education and rationale, to monitor blood pressure daily and record, calling PCP for findings outside established parameters. -  Reviewed scheduled/upcoming provider appointments including:  healthy diet promoted -still monitor sodium in her food choices,  medication adherence assessment completed patient is taking her medications as prescribed  support and encouragement provided-  11/11/20: Spoke with the patient and she states that she is doing well.  She had not checked her blood pressure today. She denies any headaches, flushing, or chest pain. She is taking her medications as prescribed and she states that she is walking more.  Her breathing is better and hewer inhalers are helping. She needs to have her Advair inhaler refilled.  She has a follow-up appointment with Dr. [name] on 12/03/20. Advised her to continue to check her BP daily,  exercise, take her medications, and monitor her diet.   Patient Goals/Self Care Activities:  Self-administers medications as prescribed Attends all scheduled provider  appointments Calls provider office for new concerns, questions, or BP outside discussed parameters Checks BP and records as discussed Follows a low sodium diet/DASH diet Find her arm BP monitor and scale and start to record here values agree to work together to make changes ask questions to understand check blood pressure 3 times per week         Lazaro Arms RN, BSN, [place] Phone: [phone] I Fax: [phone]

## 2020-12-03 ENCOUNTER — Ambulatory Visit: Payer: 59 | Admitting: Family Medicine

## 2020-12-17 ENCOUNTER — Ambulatory Visit: Payer: 59

## 2020-12-17 NOTE — Patient Instructions (Signed)
Visit Information  Ms. Goerke  it was nice speaking with you. Please call me directly [phone] if you have questions about the goals we discussed.   Goals Addressed               This Visit's Progress     Monitor My BP (pt-stated)        /Timeframe:  Long-Range Goal Priority:  High Start Date:    01/06/19                         /Expected End Date: 01/26/21    Patient Goals/Self Care Activities:  Self administers medications as prescribed Attends all scheduled provider appointments Calls provider office for new concerns, questions, or BP outside discussed parameters Checks BP and records as discussed Follows a low sodium diet/DASH diet Patient will choose one BP cuff and use it to check her BP        The patient verbalized understanding of instructions, educational materials, and care plan provided today and declined offer to receive copy of patient instructions, educational materials, and care plan.   Follow up Plan: Patient would like continued follow-up.  CCM RNCM will outreach the patient within the next 4 weeks patient will call office if needed prior to next encounter  Lazaro Arms, RN  [phone]

## 2020-12-17 NOTE — Chronic Care Management (AMB) (Signed)
Chronic Care Management   CCM RN Visit Note  12/17/2020 Name: Andrea [name] MRN: [id number] DOB: 1955/06/13  Subjective: Andrea [name] is a 65 y.o. year old female who is a primary care patient of [name], MD. The care management team was consulted for assistance with disease management and care coordination needs.    Engaged with patient by telephone for follow up visit in response to provider referral for case management and/or care coordination services.   Consent to Services:  The patient was given information about Chronic Care Management services, agreed to services, and gave verbal consent prior to initiation of services.  Please see initial visit note for detailed documentation.   Patient agreed to services and verbal consent obtained.    Assessment:  The patient is not checking her blood pressures . See Care Plan below for interventions and patient self-care actives. Follow up Plan: Patient would like continued follow-up.  CCM RNCM will outreach the patient within the next 4 weeks.  Patient will call office if needed prior to next encounter Review of patient past medical history, allergies, medications, health status, including review of consultants reports, laboratory and other test data, was performed as part of comprehensive evaluation and provision of chronic care management services.   SDOH (Social Determinants of Health) assessments and interventions performed:    CCM Care Plan  No Known Allergies  Outpatient Encounter Medications as of 12/17/2020  Medication Sig Note   albuterol (VENTOLIN HFA) 108 (90 Base) MCG/ACT inhaler Inhale 2 puffs into the lungs every 6 (six) hours as needed for wheezing or shortness of breath. 08/19/2020: <1 time per week   Blood Pressure Monitoring (BLOOD PRESSURE MONITOR/L CUFF) MISC Check blood pressure 3 times per week.    carvedilol (COREG) 6.25 MG tablet TAKE 1 TABLET BY MOUTH TWICE DAILY WITH A MEAL    famotidine  (PEPCID) 20 MG tablet Take 1 tablet (20 mg total) by mouth daily as needed for heartburn or indigestion.    fluticasone (FLONASE) 50 MCG/ACT nasal spray Place 1 spray into both nostrils daily. 1 spray in each nostril every day (Patient not taking: Reported on 09/30/2020)    fluticasone-salmeterol (ADVAIR HFA) 115-21 MCG/ACT inhaler Inhale 2 puffs into the lungs 2 (two) times daily.    levothyroxine (SYNTHROID) 125 MCG tablet TAKE 1 TABLET BY MOUTH ONCE DAILY BEFORE BREAKFAST    loratadine (CLARITIN) 10 MG tablet Take 1 tablet (10 mg total) by mouth daily.    Omega-3 Fatty Acids (FISH OIL) 1000 MG CAPS Take 1 capsule (1,000 mg total) by mouth in the morning and at bedtime. (Patient taking differently: Take 1 capsule by mouth daily.)    sertraline (ZOLOFT) 50 MG tablet Take 3 tablets by mouth once daily    simvastatin (ZOCOR) 40 MG tablet TAKE 1 TABLET BY MOUTH AT BEDTIME    spironolactone (ALDACTONE) 25 MG tablet Take 0.5 tablets (12.5 mg total) by mouth at bedtime.    Vitamin D, Cholecalciferol, 50 MCG (2000 UT) CAPS Take 2,000 Units by mouth daily.    No facility-administered encounter medications on file as of 12/17/2020.    Patient Active Problem List   Diagnosis Date Noted   Thoracic aortic ectasia ([place]) 09/07/2020   Congestive heart failure ([place]) 08/03/2020   Dyspnea 07/22/2020   Esophageal reflux 08/14/2017   Vitamin D deficiency 01/12/2017   Hypokalemia 05/27/2014   Cataract 09/19/2013   Hypothyroidism 04/26/2006   Hyperlipidemia 04/26/2006   Morbid obesity ([place]) 04/26/2006   Major  depressive disorder, recurrent episode ([place]) 04/26/2006   HYPERTENSION, BENIGN SYSTEMIC 04/26/2006    Conditions to be addressed/monitored:HTN  Care Plan : RN Case Management  Updates made by Lazaro Arms, RN since 12/17/2020 12:00 AM     Problem: Hypertension Management   Priority: High  Onset Date: 01/06/2019  Note:   Current Barriers:  Knowledge Deficits related to basic understanding of  hypertension pathophysiology and self-care management   Nurse Case Manager Clinical Goal(s):    Over the next 90 days, patient will demonstrate improved adherence to prescribed treatment plan for hypertension as evidenced by taking all medications as prescribed, monitoring and recording blood pressure as directed, adhering to low sodium/DASH diet  BP Readings from Last 3 Encounters:  09/23/20 112/82  09/03/20 136/80  08/19/20 135/80      Interventions:  Evaluation of current treatment plan related to hypertension self-management and patient's adherence to plan as established by provider. Provided education to patient re: stroke prevention, s/s of heart attack and stroke, DASH diet, complications of uncontrolled blood pressure Reviewed medications with patient and discussed importance of compliance Discussed plans with patient for ongoing care management follow up and provided patient with direct contact information for care management team Advised patient, providing education and rationale, to monitor blood pressure daily and record, calling PCP for findings outside established parameters. -  Reviewed scheduled/upcoming provider appointments including:  healthy diet promoted -still monitor sodium in her food choices,  medication adherence assessment completed patient is taking her medications as prescribed  support and encouragement provided-  12/17/20:  I spoke with Andrea [name] today, and she stated that she has not been checking her blood pressure. The last blood pressure she checked was 120/60.   She denies any headaches, flushing, chest pain, or swelling. She states that she is always on the go and gets exercise from walking. She continues to monitor her diet. I advised her to continue taking her pills as prescribed. Monitor diet and stay hydrated. The patient had her Pneumonia Covid and Zoster shot in June of this year, and I advised her to get her Flu shot. She said that she will. She  states that she rarely uses her inhaler, maybe twice a week--no problems with shortness of breath.  Patient Goals/Self Care Activities:  Self-administers medications as prescribed Attends all scheduled provider appointments Calls provider office for new concerns, questions, or BP outside discussed parameters Checks BP and records as discussed Follows a low sodium diet/DASH diet Find her arm BP monitor and scale and start to record here values agree to work together to make changes ask questions to understand check blood pressure 3 times per week         Lazaro Arms RN, BSN, [place] Phone: [phone] I Fax: [phone]

## 2021-01-14 ENCOUNTER — Ambulatory Visit: Payer: 59

## 2021-01-14 NOTE — Chronic Care Management (AMB) (Signed)
Chronic Care Management   CCM RN Visit Note  01/14/2021 Name: Andrea [name] MRN: [id number] DOB: [birth year]  Subjective: Andrea [name] is a 65 y.o. year old female who is a primary care patient of [name], MD. The care management team was consulted for assistance with disease management and care coordination needs.    Engaged with patient by telephone for follow up visit in response to provider referral for case management and/or care coordination services.   Consent to Services:  The patient was given information about Chronic Care Management services, agreed to services, and gave verbal consent prior to initiation of services.  Please see initial visit note for detailed documentation.   Patient agreed to services and verbal consent obtained.    Assessment:  The patient continues to maintain positive progress with care plan goals.. See Care Plan below for interventions and patient self-care actives. Follow up Plan: Patient would like continued follow-up.  CCM RNCM will outreach the patient within the next 5 weeks.  Patient will call office if needed prior to next encounter  Review of patient past medical history, allergies, medications, health status, including review of consultants reports, laboratory and other test data, was performed as part of comprehensive evaluation and provision of chronic care management services.   SDOH (Social Determinants of Health) assessments and interventions performed:    CCM Care Plan  No Known Allergies  Outpatient Encounter Medications as of 01/14/2021  Medication Sig Note   albuterol (VENTOLIN HFA) 108 (90 Base) MCG/ACT inhaler Inhale 2 puffs into the lungs every 6 (six) hours as needed for wheezing or shortness of breath. 08/19/2020: <1 time per week   Blood Pressure Monitoring (BLOOD PRESSURE MONITOR/L CUFF) MISC Check blood pressure 3 times per week.    carvedilol (COREG) 6.25 MG tablet TAKE 1 TABLET BY MOUTH TWICE DAILY  WITH A MEAL    famotidine (PEPCID) 20 MG tablet Take 1 tablet (20 mg total) by mouth daily as needed for heartburn or indigestion.    fluticasone (FLONASE) 50 MCG/ACT nasal spray Place 1 spray into both nostrils daily. 1 spray in each nostril every day (Patient not taking: Reported on 09/30/2020)    fluticasone-salmeterol (ADVAIR HFA) 115-21 MCG/ACT inhaler Inhale 2 puffs into the lungs 2 (two) times daily.    levothyroxine (SYNTHROID) 125 MCG tablet TAKE 1 TABLET BY MOUTH ONCE DAILY BEFORE BREAKFAST    loratadine (CLARITIN) 10 MG tablet Take 1 tablet (10 mg total) by mouth daily.    Omega-3 Fatty Acids (FISH OIL) 1000 MG CAPS Take 1 capsule (1,000 mg total) by mouth in the morning and at bedtime. (Patient taking differently: Take 1 capsule by mouth daily.)    sertraline (ZOLOFT) 50 MG tablet Take 3 tablets by mouth once daily    simvastatin (ZOCOR) 40 MG tablet TAKE 1 TABLET BY MOUTH AT BEDTIME    spironolactone (ALDACTONE) 25 MG tablet Take 0.5 tablets (12.5 mg total) by mouth at bedtime.    Vitamin D, Cholecalciferol, 50 MCG (2000 UT) CAPS Take 2,000 Units by mouth daily.    No facility-administered encounter medications on file as of 01/14/2021.    Patient Active Problem List   Diagnosis Date Noted   Thoracic aortic ectasia ([place]) 09/07/2020   Congestive heart failure ([place]) 08/03/2020   Dyspnea 07/22/2020   Esophageal reflux 08/14/2017   Vitamin D deficiency 01/12/2017   Hypokalemia 05/27/2014   Cataract 09/19/2013   Hypothyroidism 04/26/2006   Hyperlipidemia 04/26/2006   Morbid obesity ([place]) 04/26/2006  Major depressive disorder, recurrent episode ([place]) 04/26/2006   HYPERTENSION, BENIGN SYSTEMIC 04/26/2006    Conditions to be addressed/monitored:HTN  Care Plan : RN Case Management  Updates made by Lazaro Arms, RN since 01/14/2021 12:00 AM     Problem: Hypertension Management   Priority: High  Onset Date: 01/06/2019  Note:   Current Barriers:  Knowledge Deficits related to  basic understanding of hypertension pathophysiology and self-care management   Nurse Case Manager Clinical Goal(s):    Over the next 90 days, patient will demonstrate improved adherence to prescribed treatment plan for hypertension as evidenced by taking all medications as prescribed, monitoring and recording blood pressure as directed, adhering to low sodium/DASH diet  BP Readings from Last 3 Encounters:  09/23/20 112/82  09/03/20 136/80  08/19/20 135/80       Interventions:  Evaluation of current treatment plan related to hypertension self-management and patient's adherence to plan as established by provider. Provided education to patient re: stroke prevention, s/s of heart attack and stroke, DASH diet, complications of uncontrolled blood pressure Reviewed medications with patient and discussed importance of compliance Discussed plans with patient for ongoing care management follow up and provided patient with direct contact information for care management team Advised patient, providing education and rationale, to monitor blood pressure daily and record, calling PCP for findings outside established parameters. -  Reviewed scheduled/upcoming provider appointments including:  healthy diet promoted -still monitor sodium in her food choices,  medication adherence assessment completed patient is taking her medications as prescribed  support and encouragement provided-  01/14/21:  [name] is doing well; she is checking her blood pressure regularly. Today her Pressure was 139/67. She denies having any headaches, shortness of breath, or chest pain. Flushing. Walking is her form of exercise, and she gets to walk a lot. She is taking her medications as prescribed and monitoring her diet.  Patient Goals/Self Care Activities: -Patient/Caregiver will self-administer medications as prescribed as evidenced by self-report/primary caregiver report ,  -Patient/Caregiver will attend all scheduled  provider appointments as evidenced by clinician review of documented attendance to scheduled appointments and patient/caregiver report,  -Patient/Caregiver will call pharmacy for medication refills as evidenced by patient report and review of pharmacy fill history as appropriate,  -Patient/Caregiver will call provider office for new concerns or questions as evidenced by review of documented incoming telephone call notes and patient report,  -Patient/Caregiver verbalizes understanding of plan, and -Patient/Caregiver will focus on medication adherence by taking medication as prescribed -Calls provider office for new concerns, questions, or BP outside discussed parameters -Checks BP and records as discussed -Follows a low sodium diet/DASH diet          Lazaro Arms RN, BSN, [place] Management Coordinator [place] Phone: [phone] I Fax: [phone]

## 2021-01-14 NOTE — Patient Instructions (Signed)
Visit Information  Ms. Kunst  it was nice speaking with you. Please call me directly [phone] if you have questions about the goals we discussed.  Patient/Caregiver will self-administer medications as prescribed as evidenced by self-report/primary caregiver report ,  -Patient/Caregiver will attend all scheduled provider appointments as evidenced by clinician review of documented attendance to scheduled appointments and patient/caregiver report,  -Patient/Caregiver will call pharmacy for medication refills as evidenced by patient report and review of pharmacy fill history as appropriate,  -Patient/Caregiver will call provider office for new concerns or questions as evidenced by review of documented incoming telephone call notes and patient report,  -Patient/Caregiver verbalizes understanding of plan, and -Patient/Caregiver will focus on medication adherence by taking medication as prescribed -Calls provider office for new concerns, questions, or BP outside discussed parameters -Checks BP and records as discussed -Follows a low sodium diet/DASH diet   The patient verbalized understanding of instructions, educational materials, and care plan provided today and declined offer to receive copy of patient instructions, educational materials, and care plan.   Follow up Plan: Patient would like continued follow-up.  CCM RNCM will outreach the patient within the next 5 weeks.  Patient will call office if needed prior to next encounter  Lazaro Arms, RN  [phone]

## 2021-02-16 ENCOUNTER — Other Ambulatory Visit: Payer: Self-pay | Admitting: Pharmacist

## 2021-02-16 DIAGNOSIS — I10 Essential (primary) hypertension: Secondary | ICD-10-CM

## 2021-02-18 ENCOUNTER — Ambulatory Visit: Payer: 59

## 2021-02-18 NOTE — Chronic Care Management (AMB) (Signed)
Chronic Care Management   CCM RN Visit Note  02/18/2021 Name: Andrea [name] MRN: [id number] DOB: [birth year]  Subjective: Andrea [name] is a 65 y.o. year old female who is a primary care patient of [name], MD. The care management team was consulted for assistance with disease management and care coordination needs.    Engaged with patient by telephone for follow up visit in response to provider referral for case management and/or care coordination services.   Consent to Services:  The patient was given information about Chronic Care Management services, agreed to services, and gave verbal consent prior to initiation of services.  Please see initial visit note for detailed documentation.   Patient agreed to services and verbal consent obtained.    Assessment: Patient is making progress with her Hypertension , but continues to have difficulty with taking her medication regularly and checking and recording her vitals . See Care Plan below for interventions and patient self-care actives. Follow up Plan: Patient would like continued follow-up.  CCM RNCM will outreach the patient within the next 4 weeks.  Patient will call office if needed prior to next encounter Review of patient past medical history, allergies, medications, health status, including review of consultants reports, laboratory and other test data, was performed as part of comprehensive evaluation and provision of chronic care management services.   SDOH (Social Determinants of Health) assessments and interventions performed:    CCM Care Plan  No Known Allergies  Outpatient Encounter Medications as of 02/18/2021  Medication Sig Note   albuterol (VENTOLIN HFA) 108 (90 Base) MCG/ACT inhaler Inhale 2 puffs into the lungs every 6 (six) hours as needed for wheezing or shortness of breath. 08/19/2020: <1 time per week   Blood Pressure Monitoring (BLOOD PRESSURE MONITOR/L CUFF) MISC Check blood pressure 3 times  per week.    carvedilol (COREG) 6.25 MG tablet TAKE 1 TABLET BY MOUTH TWICE DAILY WITH A MEAL    famotidine (PEPCID) 20 MG tablet Take 1 tablet (20 mg total) by mouth daily as needed for heartburn or indigestion.    fluticasone (FLONASE) 50 MCG/ACT nasal spray Place 1 spray into both nostrils daily. 1 spray in each nostril every day (Patient not taking: Reported on 09/30/2020)    fluticasone-salmeterol (ADVAIR HFA) 115-21 MCG/ACT inhaler Inhale 2 puffs into the lungs 2 (two) times daily.    levothyroxine (SYNTHROID) 125 MCG tablet TAKE 1 TABLET BY MOUTH ONCE DAILY BEFORE BREAKFAST    loratadine (CLARITIN) 10 MG tablet Take 1 tablet (10 mg total) by mouth daily.    Omega-3 Fatty Acids (FISH OIL) 1000 MG CAPS Take 1 capsule (1,000 mg total) by mouth in the morning and at bedtime. (Patient taking differently: Take 1 capsule by mouth daily.)    sertraline (ZOLOFT) 50 MG tablet Take 3 tablets by mouth once daily    simvastatin (ZOCOR) 40 MG tablet TAKE 1 TABLET BY MOUTH AT BEDTIME    spironolactone (ALDACTONE) 25 MG tablet TAKE 1 TABLET BY MOUTH AT BEDTIME    Vitamin D, Cholecalciferol, 50 MCG (2000 UT) CAPS Take 2,000 Units by mouth daily.    No facility-administered encounter medications on file as of 02/18/2021.    Patient Active Problem List   Diagnosis Date Noted   Thoracic aortic ectasia ([place]) 09/07/2020   Congestive heart failure ([place]) 08/03/2020   Dyspnea 07/22/2020   Esophageal reflux 08/14/2017   Vitamin D deficiency 01/12/2017   Hypokalemia 05/27/2014   Cataract 09/19/2013   Hypothyroidism 04/26/2006  Hyperlipidemia 04/26/2006   Morbid obesity ([place]) 04/26/2006   Major depressive disorder, recurrent episode ([place]) 04/26/2006   HYPERTENSION, BENIGN SYSTEMIC 04/26/2006    Conditions to be addressed/monitored:HTN  Care Plan : RN Case Management  Updates made by Lazaro Arms, RN since 02/18/2021 12:00 AM     Problem: Hypertension Management   Priority: High  Onset Date:  01/06/2019  Note:   Current Barriers:  Knowledge Deficits related to basic understanding of hypertension pathophysiology and self-care management   Nurse Case Manager Clinical Goal(s):    Over the next 90 days, patient will demonstrate improved adherence to prescribed treatment plan for hypertension as evidenced by taking all medications as prescribed, monitoring and recording blood pressure as directed, adhering to low sodium/DASH diet  Interventions: 1:1 collaboration with primary care provider regarding development and update of comprehensive plan of care as evidenced by provider attestation and co-signature Inter-disciplinary care team collaboration (see longitudinal plan of care) Evaluation of current treatment plan related to  self management and patient's adherence to plan as established by provider   Hypertension: (Status: Goal on track: NO.) [place] Last practice recorded BP readings:  BP Readings from Last 3 Encounters:  09/23/20 112/82  09/03/20 136/80  08/19/20 135/80  Most recent eGFR/CrCl:  Lab Results  Component Value Date   EGFR 59 (L) 09/23/2020    No components found for: CRCL   Evaluation of current treatment plan related to hypertension self-management and patient's adherence to plan as established by provider. Provided education to patient re: stroke prevention, s/s of heart attack and stroke, DASH diet, complications of uncontrolled blood pressure Reviewed medications with patient and discussed importance of compliance Discussed plans with patient for ongoing care management follow up and provided patient with direct contact information for care management team Advised patient, providing education and rationale, to monitor blood pressure daily and record, calling PCP for findings outside established parameters. -  Reviewed scheduled/upcoming provider appointments including:  healthy diet promoted -still monitor sodium in her food choices,  medication  adherence assessment completed patient is taking her medications as prescribed  support and encouragement provided-  02/18/21: Speaking with Mrs. [name] states that she has been having some family issues, and she has not been taking her medications or checking her blood pressure regularly. She has just started back. She is going to start trying to focus on her. We discussed writing and recording her blood pressure and its importance. She verbalized understanding.   Patient Goals/Self Care Activities: -Patient/Caregiver will self-administer medications as prescribed as evidenced by self-report/primary caregiver report  -Patient/Caregiver will attend all scheduled provider appointments as evidenced by clinician review of documented attendance to scheduled appointments and patient/caregiver report -Patient/Caregiver will call pharmacy for medication refills as evidenced by patient report and review of pharmacy fill history as appropriate -Patient/Caregiver will call provider office for new concerns or questions as evidenced by review of documented incoming telephone call notes and patient report -Patient/Caregiver verbalizes understanding of plan -Patient/Caregiver will focus on medication adherence by taking medications as prescribed -Calls provider office for new concerns, questions, or BP outside discussed parameters -Checks BP and records as discussed -Follows a low sodium diet/DASH diet      Lazaro Arms RN, BSN, [place] Management Coordinator [place] Phone: [phone] I Fax: [phone]

## 2021-02-18 NOTE — Patient Instructions (Signed)
Visit Information  Andrea [name]  it was nice speaking with you. Please call me directly [phone] if you have questions about the goals we discussed.   Patient Goals/Self Care Activities: -Patient/Caregiver will self-administer medications as prescribed as evidenced by self-report/primary caregiver report  -Patient/Caregiver will attend all scheduled provider appointments as evidenced by clinician review of documented attendance to scheduled appointments and patient/caregiver report -Patient/Caregiver will call pharmacy for medication refills as evidenced by patient report and review of pharmacy fill history as appropriate -Patient/Caregiver will call provider office for new concerns or questions as evidenced by review of documented incoming telephone call notes and patient report -Patient/Caregiver verbalizes understanding of plan -Patient/Caregiver will focus on medication adherence by taking medications as prescribed -Calls provider office for new concerns, questions, or BP outside discussed parameters -Checks BP and records as discussed -Follows a low sodium diet/DASH diet  The patient verbalized understanding of instructions, educational materials, and care plan provided today and declined offer to receive copy of patient instructions, educational materials, and care plan.   Follow up Plan: Patient would like continued follow-up.  CCM RNCM will outreach the patient within the next 4 weeks.  Patient will call office if needed prior to next encounter  Lazaro Arms, RN  [phone]

## 2021-03-18 ENCOUNTER — Telehealth: Payer: Medicare HMO

## 2021-03-21 ENCOUNTER — Other Ambulatory Visit: Payer: Self-pay | Admitting: Family Medicine

## 2021-03-22 ENCOUNTER — Ambulatory Visit: Payer: 59

## 2021-03-22 NOTE — Patient Instructions (Signed)
Visit Information  Ms. Slovacek  it was nice speaking with you. Please call me directly [phone] if you have questions about the goals we discussed.  Patient Goals/Self Care Activities: -Patient/Caregiver will self-administer medications as prescribed as evidenced by self-report/primary caregiver report  -Patient/Caregiver will attend all scheduled provider appointments as evidenced by clinician review of documented attendance to scheduled appointments and patient/caregiver report -Patient/Caregiver will call pharmacy for medication refills as evidenced by patient report and review of pharmacy fill history as appropriate -Patient/Caregiver will call provider office for new concerns or questions as evidenced by review of documented incoming telephone call notes and patient report -Patient/Caregiver verbalizes understanding of plan -Patient/Caregiver will focus on medication adherence by taking medications as prescribed -Calls provider office for new concerns, questions, or BP outside discussed parameters -Checks BP and records as discussed -Follows a low sodium diet/DASH diet -Patient will continue to cook healthy meals at home -Patient will look into the silver sneakers program -Patient will do her research for a new blood pressure machine          The patient verbalized understanding of instructions, educational materials, and care plan provided today and agreed to receive a mailed copy of patient instructions, educational materials, and care plan.   Follow up Plan: Patient would like continued follow-up.  CCM RNCM will outreach the patient within the next 5 weeks.  Patient will call office if needed prior to next encounter  Lazaro Arms, RN  [phone]

## 2021-03-22 NOTE — Chronic Care Management (AMB) (Signed)
Chronic Care Management   CCM RN Visit Note  03/22/2021 Name: Andrea [name] MRN: [id number] DOB: [birth year]  Subjective: Andrea [name] is a 66 y.o. year old female who is a primary care patient of [name], MD. The care management team was consulted for assistance with disease management and care coordination needs.    Engaged with patient by telephone for follow up visit in response to provider referral for case management and/or care coordination services.   Consent to Services:  The patient was given information about Chronic Care Management services, agreed to services, and gave verbal consent prior to initiation of services.  Please see initial visit note for detailed documentation.   Patient agreed to services and verbal consent obtained.    Summary:  The patient continues to maintain positive progress with care plan goals. See Care Plan below for interventions and patient self-care actives.  Recommendation: The patient may benefit from obtaining a new blood pressure monitor, continuing cooking health meals at home and looking into the silver sneaker program.  The patient agrees.  Follow up Plan: Patient would like continued follow-up.  CCM RNCM will outreach the patient within the next 5 weeks.  Patient will call office if needed prior to next encounter   Assessment: Review of patient past medical history, allergies, medications, health status, including review of consultants reports, laboratory and other test data, was performed as part of comprehensive evaluation and provision of chronic care management services.   SDOH (Social Determinants of Health) assessments and interventions performed:  No  CCM Care Plan    Conditions to be addressed/monitored:HTN  Care Plan : RN Case Management  Updates made by Lazaro Arms, RN since 03/22/2021 12:00 AM     Problem: Hypertension Management   Priority: High  Onset Date: 01/06/2019  Note:   Current Barriers:   Knowledge Deficits related to basic understanding of hypertension pathophysiology and self-care management   Nurse Case Manager Clinical Goal(s):    Over the next 90 days, patient will demonstrate improved adherence to prescribed treatment plan for hypertension as evidenced by taking all medications as prescribed, monitoring and recording blood pressure as directed, adhering to low sodium/DASH diet  Interventions: 1:1 collaboration with primary care provider regarding development and update of comprehensive plan of care as evidenced by provider attestation and co-signature Inter-disciplinary care team collaboration (see longitudinal plan of care) Evaluation of current treatment plan related to  self management and patient's adherence to plan as established by provider   Hypertension: (Status: Goal on track: NO.) [place] Last practice recorded BP readings:  BP Readings from Last 3 Encounters:  09/23/20 112/82  09/03/20 136/80  08/19/20 135/80  Most recent eGFR/CrCl:  Lab Results  Component Value Date   EGFR 59 (L) 09/23/2020    No components found for: CRCL   Evaluation of current treatment plan related to hypertension self-management and patient's adherence to plan as established by provider. Provided education to patient re: stroke prevention, s/s of heart attack and stroke, DASH diet, complications of uncontrolled blood pressure Reviewed medications with patient and discussed importance of compliance Discussed plans with patient for ongoing care management follow up and provided patient with direct contact information for care management team Advised patient, providing education and rationale, to monitor blood pressure daily and record, calling PCP for findings outside established parameters. -  healthy diet promoted -still monitor sodium in her food choices,  medication adherence assessment completed patient is taking her medications as prescribed  support  and encouragement  provided-  03/22/21: I spoke with [name] today, and she is feeling better. She has been unable to check her blood pressure because her machine broke. The insurance company will help her get a new one, but she will do some research to find the correct one to fit her arm. She denies any headaches, chest pain, shortness of breath, or flushing. Regarding her eating habits, she eats more at home, makes different recipes, and goes out now and then. She still takes her meds as prescribed. For exercise, she is going to look into the silver sneakers program. I congratulated her on the change.   Patient Goals/Self Care Activities: -Patient/Caregiver will self-administer medications as prescribed as evidenced by self-report/primary caregiver report  -Patient/Caregiver will attend all scheduled provider appointments as evidenced by clinician review of documented attendance to scheduled appointments and patient/caregiver report -Patient/Caregiver will call pharmacy for medication refills as evidenced by patient report and review of pharmacy fill history as appropriate -Patient/Caregiver will call provider office for new concerns or questions as evidenced by review of documented incoming telephone call notes and patient report -Patient/Caregiver verbalizes understanding of plan -Patient/Caregiver will focus on medication adherence by taking medications as prescribed -Calls provider office for new concerns, questions, or BP outside discussed parameters -Checks BP and records as discussed -Follows a low sodium diet/DASH diet -Patient will continue to cook healthy meals at home -Patient will look into the silver sneakers program -Patient will do her research for a new blood pressure machine     Lazaro Arms RN, BSN, [place] Management Coordinator [facility]  Phone: [phone]

## 2021-03-25 ENCOUNTER — Ambulatory Visit (INDEPENDENT_AMBULATORY_CARE_PROVIDER_SITE_OTHER): Payer: Medicare HMO

## 2021-03-25 ENCOUNTER — Ambulatory Visit (INDEPENDENT_AMBULATORY_CARE_PROVIDER_SITE_OTHER): Payer: Medicare HMO | Admitting: Family Medicine

## 2021-03-25 ENCOUNTER — Encounter: Payer: Self-pay | Admitting: Family Medicine

## 2021-03-25 ENCOUNTER — Other Ambulatory Visit: Payer: Self-pay

## 2021-03-25 VITALS — BP 177/122 | HR 70 | Ht 64.0 in | Wt 271.4 lb

## 2021-03-25 DIAGNOSIS — N289 Disorder of kidney and ureter, unspecified: Secondary | ICD-10-CM | POA: Diagnosis not present

## 2021-03-25 DIAGNOSIS — I10 Essential (primary) hypertension: Secondary | ICD-10-CM | POA: Diagnosis not present

## 2021-03-25 DIAGNOSIS — J984 Other disorders of lung: Secondary | ICD-10-CM | POA: Diagnosis not present

## 2021-03-25 DIAGNOSIS — E785 Hyperlipidemia, unspecified: Secondary | ICD-10-CM | POA: Diagnosis not present

## 2021-03-25 DIAGNOSIS — R69 Illness, unspecified: Secondary | ICD-10-CM | POA: Diagnosis not present

## 2021-03-25 DIAGNOSIS — Z23 Encounter for immunization: Secondary | ICD-10-CM

## 2021-03-25 DIAGNOSIS — I509 Heart failure, unspecified: Secondary | ICD-10-CM

## 2021-03-25 DIAGNOSIS — E038 Other specified hypothyroidism: Secondary | ICD-10-CM

## 2021-03-25 DIAGNOSIS — R7309 Other abnormal glucose: Secondary | ICD-10-CM | POA: Diagnosis not present

## 2021-03-25 DIAGNOSIS — R7303 Prediabetes: Secondary | ICD-10-CM | POA: Diagnosis not present

## 2021-03-25 DIAGNOSIS — F331 Major depressive disorder, recurrent, moderate: Secondary | ICD-10-CM

## 2021-03-25 LAB — POCT GLYCOSYLATED HEMOGLOBIN (HGB A1C): Hemoglobin A1C: 5.9 % — AB (ref 4.0–5.6)

## 2021-03-25 NOTE — Assessment & Plan Note (Signed)
She is yet to take her medication today. There might also be some confusion about her dose. She could not tell me her medication dose. The pharmacy team apparently reduced her Aldactone to 12.5 mg in Nov. However. She might still be taking the 25 mg per order. After discussing with her, we agreed not to adjust her medications today. I advised that she switch her BP meds time to 6 PM every day so that she would have already taken her meds when she comes to see me the next time. She will return to RN's clinic in 3 days for a BP check. Red flags and ED precautions were discussed. Monitor BP at home closely. An appointment was made for her to see me in 2 weeks. I will contact her before then to come in with all her medication bottles for reconciliation.

## 2021-03-25 NOTE — Patient Instructions (Signed)
Heart-Healthy Eating Plan Heart-healthy meal planning includes: Eating less unhealthy fats. Eating more healthy fats. Making other changes in your diet. Talk with your doctor or a diet specialist (dietitian) to create an eating plan that is right for you. What is my plan? Your doctor may recommend an eating plan that includes: Total fat: ______% or less of total calories a day. Saturated fat: ______% or less of total calories a day. Cholesterol: less than _________mg a day. What are tips for following this plan? Cooking Avoid frying your food. Try to bake, boil, grill, or broil it instead. You can also reduce fat by: Removing the skin from poultry. Removing all visible fats from meats. Steaming vegetables in water or broth. Meal planning  At meals, divide your plate into four equal parts: Fill one-half of your plate with vegetables and green salads. Fill one-fourth of your plate with whole grains. Fill one-fourth of your plate with lean protein foods. Eat 4-5 servings of vegetables per day. A serving of vegetables is: 1 cup of raw or cooked vegetables. 2 cups of raw leafy greens. Eat 4-5 servings of fruit per day. A serving of fruit is: 1 medium whole fruit.  cup of dried fruit.  cup of fresh, frozen, or canned fruit.  cup of 100% fruit juice. Eat more foods that have soluble fiber. These are apples, broccoli, carrots, beans, peas, and barley. Try to get 20-30 g of fiber per day. Eat 4-5 servings of nuts, legumes, and seeds per week: 1 serving of dried beans or legumes equals  cup after being cooked. 1 serving of nuts is  cup. 1 serving of seeds equals 1 tablespoon. General information Eat more home-cooked food. Eat less restaurant, buffet, and fast food. Limit or avoid alcohol. Limit foods that are high in starch and sugar. Avoid fried foods. Lose weight if you are overweight. Keep track of how much salt (sodium) you eat. This is important if you have high blood  pressure. Ask your doctor to tell you more about this. Try to add vegetarian meals each week. Fats Choose healthy fats. These include olive oil and canola oil, flaxseeds, walnuts, almonds, and seeds. Eat more omega-3 fats. These include salmon, mackerel, sardines, tuna, flaxseed oil, and ground flaxseeds. Try to eat fish at least 2 times each week. Check food labels. Avoid foods with trans fats or high amounts of saturated fat. Limit saturated fats. These are often found in animal products, such as meats, butter, and cream. These are also found in plant foods, such as palm oil, palm kernel oil, and coconut oil. Avoid foods with partially hydrogenated oils in them. These have trans fats. Examples are stick margarine, some tub margarines, cookies, crackers, and other baked goods. What foods can I eat? Fruits All fresh, canned (in natural juice), or frozen fruits. Vegetables Fresh or frozen vegetables (raw, steamed, roasted, or grilled). Green salads. Grains Most grains. Choose whole wheat and whole grains most of the time. Rice and pasta, including brown rice and pastas made with whole wheat. Meats and other proteins Lean, well-trimmed beef, veal, pork, and lamb. Chicken and turkey without skin. All fish and shellfish. Wild duck, rabbit, pheasant, and venison. Egg whites or low-cholesterol egg substitutes. Dried beans, peas, lentils, and tofu. Seeds and most nuts. Dairy Low-fat or nonfat cheeses, including ricotta and mozzarella. Skim or 1% milk that is liquid, powdered, or evaporated. Buttermilk that is made with low-fat milk. Nonfat or low-fat yogurt. Fats and oils Non-hydrogenated (trans-free) margarines. Vegetable oils, including   soybean, sesame, sunflower, olive, peanut, safflower, corn, canola, and cottonseed. Salad dressings or mayonnaise made with a vegetable oil. Beverages Mineral water. Coffee and tea. Diet carbonated beverages. Sweets and desserts Sherbet, gelatin, and fruit ice.  Small amounts of dark chocolate. Limit all sweets and desserts. Seasonings and condiments All seasonings and condiments. The items listed above may not be a complete list of foods and drinks you can eat. Contact a dietitian for more options. What foods should I avoid? Fruits Canned fruit in heavy syrup. Fruit in cream or butter sauce. Fried fruit. Limit coconut. Vegetables Vegetables cooked in cheese, cream, or butter sauce. Fried vegetables. Grains Breads that are made with saturated or trans fats, oils, or whole milk. Croissants. Sweet rolls. Donuts. High-fat crackers, such as cheese crackers. Meats and other proteins Fatty meats, such as hot dogs, ribs, sausage, bacon, rib-eye roast or steak. High-fat deli meats, such as salami and bologna. Caviar. Domestic duck and goose. Organ meats, such as liver. Dairy Cream, sour cream, cream cheese, and creamed cottage cheese. Whole-milk cheeses. Whole or 2% milk that is liquid, evaporated, or condensed. Whole buttermilk. Cream sauce or high-fat cheese sauce. Yogurt that is made from whole milk. Fats and oils Meat fat, or shortening. Cocoa butter, hydrogenated oils, palm oil, coconut oil, palm kernel oil. Solid fats and shortenings, including bacon fat, salt pork, lard, and butter. Nondairy cream substitutes. Salad dressings with cheese or sour cream. Beverages Regular sodas and juice drinks with added sugar. Sweets and desserts Frosting. Pudding. Cookies. Cakes. Pies. Milk chocolate or white chocolate. Buttered syrups. Full-fat ice cream or ice cream drinks. The items listed above may not be a complete list of foods and drinks to avoid. Contact a dietitian for more information. Summary Heart-healthy meal planning includes eating less unhealthy fats, eating more healthy fats, and making other changes in your diet. Eat a balanced diet. This includes fruits and vegetables, low-fat or nonfat dairy, lean protein, nuts and legumes, whole grains, and  heart-healthy oils and fats. This information is not intended to replace advice given to you by your health care provider. Make sure you discuss any questions you have with your health care provider. Document Revised: 06/24/2020 Document Reviewed: 06/24/2020 Elsevier Patient Education  2022 Elsevier Inc.  

## 2021-03-25 NOTE — Progress Notes (Signed)
SUBJECTIVE:   CHIEF COMPLAINT / HPI:   CHF/restrictive lung dx: She denies any SOB, chest pain, or leg swelling. She takes Advair as needed. Here for f/u.  HTN: She did not take her medication this morning as she has yet to have breakfast. She is inconsistent with what time of the day she takes her meds. Her home BP meds include Coreg 6.25 mg BID and Aldactone 25 mg QD. She denies headaches, blurry vision, chest pain, or dizziness. She feels well in general.  Hypothyroid: She is compliant with Synthroid 125 mcg qd. No new concerns.   HLD/Obesity: She is compliant with Simvastatin  40 mg QD. She is here for her follow-up. She gets as much exercise as tolerated for weight management.  Prediabetis: Here for f/u.  Impaired renal: Previous test with elevated creatinine level. She has no renal concerns.   PERTINENT  PMH / PSH: PMHx reviewed  OBJECTIVE:   Vitals:   03/25/21 0951 03/25/21 1006  BP: (!) 177/109 (!) 177/122  Pulse: 70   SpO2: 100%   Weight: 271 lb 6.4 oz (123.1 kg)   Height: 5\' 4"  (1.626 m)     Physical Exam Vitals and nursing note reviewed.  Cardiovascular:     Rate and Rhythm: Normal rate and regular rhythm.     Heart sounds: Normal heart sounds. No murmur heard. Pulmonary:     Effort: Pulmonary effort is normal. No respiratory distress.     Breath sounds: Normal breath sounds. No wheezing.  Musculoskeletal:     Right lower leg: No edema.     Left lower leg: No edema.  Neurological:     General: No focal deficit present.     Mental Status: She is oriented to person, place, and time.     ASSESSMENT/PLAN:   Congestive heart failure (HCC) No acute exacerbation. Monitor closely for now. Continue Coreg at current dose.  Restrictive lung disease Recent PFT shows moderate restrictive lung dx. Good response to Advair in the past. I encouraged her to use this BID as instructed instead of prn. She agreed with the plan.   HYPERTENSION, BENIGN  SYSTEMIC She is yet to take her medication today. There might also be some confusion about her dose. She could not tell me her medication dose. The pharmacy team apparently reduced her Aldactone to 12.5 mg in Nov. However. She might still be taking the 25 mg per order. After discussing with her, we agreed not to adjust her medications today. I advised that she switch her BP meds time to 6 PM every day so that she would have already taken her meds when she comes to see me the next time. She will return to RN's clinic in 3 days for a BP check. Red flags and ED precautions were discussed. Monitor BP at home closely. An appointment was made for her to see me in 2 weeks. I will contact her before then to come in with all her medication bottles for reconciliation.   Hypothyroidism She is stable on her current regimen.   Hyperlipidemia Unable to obtain repeat lab today. We will recheck FLP at follow-up.  Need LDL goal < 70 given her preDM status.  Major depressive disorder, recurrent episode ([place]) Continue diet and exercise. We will discuss SGLT2 in the future for weight loss.  Renal impairment/PreDM A1C increased to 5.9 Counseling provided. Monitor closely. F/U in 2-3 weeks for labs.     NB: Lab tech unavailable for lab drawn today. We will  obtain lab at her next visit with me.  [name], MD [place]

## 2021-03-25 NOTE — Assessment & Plan Note (Signed)
She is stable on her current regimen.

## 2021-03-25 NOTE — Assessment & Plan Note (Signed)
F/U in 2-3 weeks for labs.

## 2021-03-25 NOTE — Assessment & Plan Note (Signed)
No acute exacerbation. Monitor closely for now. Continue Coreg at current dose.

## 2021-03-25 NOTE — Assessment & Plan Note (Signed)
Continue diet and exercise. We will discuss SGLT2 in the future for weight loss.

## 2021-03-25 NOTE — Assessment & Plan Note (Signed)
Recent PFT shows moderate restrictive lung dx. Good response to Advair in the past. I encouraged her to use this BID as instructed instead of prn. She agreed with the plan.

## 2021-03-25 NOTE — Assessment & Plan Note (Signed)
A1C increased to 5.9 Counseling provided. Monitor closely.

## 2021-03-25 NOTE — Assessment & Plan Note (Addendum)
Unable to obtain repeat lab today. We will recheck FLP at follow-up.  Need LDL goal < 70 given her preDM status.

## 2021-03-28 ENCOUNTER — Other Ambulatory Visit: Payer: Self-pay

## 2021-03-28 ENCOUNTER — Ambulatory Visit (INDEPENDENT_AMBULATORY_CARE_PROVIDER_SITE_OTHER): Payer: Medicare HMO

## 2021-03-28 ENCOUNTER — Ambulatory Visit: Payer: Medicare HMO

## 2021-03-28 VITALS — BP 132/94 | HR 78

## 2021-03-28 DIAGNOSIS — Z0130 Encounter for examination of blood pressure without abnormal findings: Secondary | ICD-10-CM

## 2021-03-28 NOTE — Progress Notes (Signed)
Patient here today for BP check.      Last BP was on 03/25/2021 and was 177/122.  BP today is 132/94 with a pulse of 78.    Checked BP in right arm with regular adult cuff.    Symptoms present: none.   Patient has been taking BP medications as instructed by Dr. [name].    Precepted with Dr. Wendy Poet, agreed with plan for patient to continue current medications and to follow up with PCP at visit on 04/08/21.   Talbot Grumbling, RN

## 2021-04-08 ENCOUNTER — Ambulatory Visit (INDEPENDENT_AMBULATORY_CARE_PROVIDER_SITE_OTHER): Payer: Medicare HMO | Admitting: Family Medicine

## 2021-04-08 ENCOUNTER — Other Ambulatory Visit: Payer: Self-pay

## 2021-04-08 ENCOUNTER — Encounter: Payer: Self-pay | Admitting: Family Medicine

## 2021-04-08 VITALS — BP 136/108 | HR 72 | Ht 64.0 in | Wt 267.2 lb

## 2021-04-08 DIAGNOSIS — E2839 Other primary ovarian failure: Secondary | ICD-10-CM

## 2021-04-08 DIAGNOSIS — K219 Gastro-esophageal reflux disease without esophagitis: Secondary | ICD-10-CM

## 2021-04-08 DIAGNOSIS — J302 Other seasonal allergic rhinitis: Secondary | ICD-10-CM | POA: Diagnosis not present

## 2021-04-08 DIAGNOSIS — I10 Essential (primary) hypertension: Secondary | ICD-10-CM

## 2021-04-08 DIAGNOSIS — F331 Major depressive disorder, recurrent, moderate: Secondary | ICD-10-CM | POA: Diagnosis not present

## 2021-04-08 DIAGNOSIS — R69 Illness, unspecified: Secondary | ICD-10-CM | POA: Diagnosis not present

## 2021-04-08 MED ORDER — FAMOTIDINE 20 MG PO TABS
20.0000 mg | ORAL_TABLET | Freq: Every day | ORAL | 1 refills | Status: DC
Start: 2021-04-08 — End: 2022-05-05

## 2021-04-08 MED ORDER — LORATADINE 10 MG PO TABS
10.0000 mg | ORAL_TABLET | Freq: Every day | ORAL | 2 refills | Status: DC
Start: 2021-04-08 — End: 2023-02-06

## 2021-04-08 MED ORDER — SERTRALINE HCL 50 MG PO TABS: 100.0000 mg | ORAL_TABLET | Freq: Every day | ORAL | 3 refills | Status: DC

## 2021-04-08 NOTE — Assessment & Plan Note (Signed)
BP still elevated, but improved after recheck. Diastolic remains elevated. Plan to increase her Aldactone to 50 mg QD from 25 mg. F/U in 1-2 weeks for BP monitoring and Bmet for K+ check. Appointment made. BP parameters provided. F/U soon if needed.

## 2021-04-08 NOTE — Assessment & Plan Note (Signed)
I am unclear of the issue with her Famotidine as this is a preferred medication for her insurance. Med refilled. She will contact her insurance for clarification.

## 2021-04-08 NOTE — Assessment & Plan Note (Signed)
She is willing to trial Sertaline 50 mg (2 tab daily), I.e 100 mg daily. This is a reduction from 150 mg QD. Med refill sent to her pharmacy. Reassess in 2-4 weeks.

## 2021-04-08 NOTE — Addendum Note (Signed)
Addended by: [name] T on: 04/08/2021 02:51 PM   Modules accepted: Orders

## 2021-04-08 NOTE — Patient Instructions (Signed)
It was nice seeing you today.  Your BP is still elevated. Continue Coreg 6.25 mg BID. Increase Aldactone to 50 mg daily. Come back and see me in 1-2 weeks for BP check and blood test for your potassium. Call if your blood pressure less than 100/60 or higher than 150/90

## 2021-04-08 NOTE — Assessment & Plan Note (Signed)
She has lost about 4 lbs since her last visit. She is excited about this. I commended her on a job well done. She will continue to work on this.

## 2021-04-08 NOTE — Progress Notes (Addendum)
° ° °  SUBJECTIVE:   CHIEF COMPLAINT / HPI:   HTN/Weight management: She is here to f/u for her BP. She is compliant with Coreg 6.25 BID and Aldactone 25 mg nightly. She forgot to come in with her BP log from home. No other BP-related concerns today. She is doing well on diet and exercise for weight loss.  MDD: She denies any depression symptoms. She received a letter from her insurance regarding her Sertraline. Per the letter content, she will no longer be receiving 90 tablets per month. The maximum monthly supply allowed is 60 tablets per month. She is currently on Zoloft 50 mg 3 tabs QD.   GERD: She mentioned that someone from her insurance requested that her Famotidine be switched for 150 mg capsule instead as her current dose and regimen is not covered by her insurance.  Seasonal allergy: Need a refill of her Loratadine.  PERTINENT  PMH / PSH: PHx reviewed   OBJECTIVE:   Vitals:   04/08/21 1124 04/08/21 1145  BP: (!) 161/105 (!) 136/108  Pulse: 75 72  SpO2: 99%   Weight: 267 lb 3.2 oz (121.2 kg)   Height: 5\' 4"  (1.626 m)     Physical Exam Vitals and nursing note reviewed.  Cardiovascular:     Rate and Rhythm: Normal rate and regular rhythm.     Heart sounds: Normal heart sounds. No murmur heard. Pulmonary:     Effort: Pulmonary effort is normal. No respiratory distress.     Breath sounds: Normal breath sounds. No wheezing.  Abdominal:     General: Abdomen is flat. Bowel sounds are normal. There is no distension.     Palpations: Abdomen is soft.     Tenderness: There is no abdominal tenderness.  Neurological:     Mental Status: She is oriented to person, place, and time.     ASSESSMENT/PLAN:   HYPERTENSION, BENIGN SYSTEMIC BP still elevated, but improved after recheck. Diastolic remains elevated. Plan to increase her Aldactone to 50 mg QD from 25 mg. F/U in 1-2 weeks for BP monitoring and Bmet for K+ check. Appointment made. BP parameters provided. F/U soon  if needed.  Morbid obesity ([place]) She has lost about 4 lbs since her last visit. She is excited about this. I commended her on a job well done. She will continue to work on this.  Major depressive disorder, recurrent episode ([place]) She is willing to trial Sertaline 50 mg (2 tab daily), I.e 100 mg daily. This is a reduction from 150 mg QD. Med refill sent to her pharmacy. Reassess in 2-4 weeks.  Esophageal reflux I am unclear of the issue with her Famotidine as this is a preferred medication for her insurance. Med refilled. She will contact her insurance for clarification.  Seasonal allergies Refilled Loratadine.    HM: Dexa scan discussed and was ordered.  [name], MD [place]

## 2021-04-08 NOTE — Assessment & Plan Note (Signed)
Refilled Loratadine.

## 2021-04-19 ENCOUNTER — Ambulatory Visit: Payer: Medicare HMO | Admitting: Family Medicine

## 2021-04-22 ENCOUNTER — Ambulatory Visit (INDEPENDENT_AMBULATORY_CARE_PROVIDER_SITE_OTHER): Payer: Medicare HMO | Admitting: Family Medicine

## 2021-04-22 ENCOUNTER — Encounter: Payer: Self-pay | Admitting: Family Medicine

## 2021-04-22 ENCOUNTER — Other Ambulatory Visit: Payer: Self-pay

## 2021-04-22 DIAGNOSIS — I10 Essential (primary) hypertension: Secondary | ICD-10-CM

## 2021-04-22 DIAGNOSIS — J984 Other disorders of lung: Secondary | ICD-10-CM | POA: Diagnosis not present

## 2021-04-22 DIAGNOSIS — R0600 Dyspnea, unspecified: Secondary | ICD-10-CM

## 2021-04-22 MED ORDER — SPIRONOLACTONE 50 MG PO TABS: 50.0000 mg | ORAL_TABLET | Freq: Every day | ORAL | 1 refills | Status: DC

## 2021-04-22 MED ORDER — AMLODIPINE BESYLATE 5 MG PO TABS: 5.0000 mg | ORAL_TABLET | Freq: Every day | ORAL | 1 refills | Status: DC

## 2021-04-22 MED ORDER — ADVAIR HFA 115-21 MCG/ACT IN AERO: 2.0000 | INHALATION_SPRAY | Freq: Two times a day (BID) | RESPIRATORY_TRACT | 12 refills | Status: DC

## 2021-04-22 NOTE — Patient Instructions (Signed)
Your BP is still elevated. I added Norvasc 5 mg daily to your BP regimen. Continue Aldactone (Spironolactone 50mg ) daily, I.e 2 of your 25 mg tables. Continue Coreg 6.25 mg BID. I will see you back in 4 weeks.

## 2021-04-22 NOTE — Assessment & Plan Note (Addendum)
BP uncontrolled despite current regimen and good compliance. Will not go up on Coreg since her HR is low normal. She was on Norvasc in the past and self d/ced it. We discussed resuming this medication. Refill sent to her pharmacy. Bmet checked. Continue home BP monitoring and return in 4 weeks for reassessment. F/U sooner if BP worsens, or she becomes symptomatic. She agreed with the plan.

## 2021-04-22 NOTE — Progress Notes (Signed)
° ° °  SUBJECTIVE:   CHIEF COMPLAINT / HPI:   HTN: She is here for f/u. She is compliant with Aldactone 50 mg qd and has been taking her Coreg 6.25 mg, two tablets at night time instead of BID. She did not come in with her home BP log or medication bottles.  Restrictive lung disease: She is out of her Advair. Has not needed to use her rescue inhaler often. She is doing well otherwise.  PERTINENT  PMH / PSH: PMHx reviewed  OBJECTIVE:   Vitals:   04/22/21 1105 04/22/21 1116  BP: (!) 162/104 (!) 168/113  Pulse: 65   SpO2: 99%   Weight: 266 lb 9.6 oz (120.9 kg)   Height: 5\' 4"  (1.626 m)     Physical Exam Vitals and nursing note reviewed.  Cardiovascular:     Rate and Rhythm: Normal rate and regular rhythm.     Heart sounds: Normal heart sounds. No murmur heard. Pulmonary:     Effort: Pulmonary effort is normal. No respiratory distress.     Breath sounds: Normal breath sounds.  Abdominal:     General: Abdomen is flat. Bowel sounds are normal. There is no distension.     Palpations: Abdomen is soft.     Tenderness: There is no abdominal tenderness.  Musculoskeletal:     Right lower leg: No edema.     Left lower leg: No edema.     ASSESSMENT/PLAN:   HYPERTENSION, BENIGN SYSTEMIC BP uncontrolled despite current regimen and good compliance. Will not go up on Coreg since her HR is low normal. She was on Norvasc in the past and self d/ced it. We discussed resuming this medication. Refill sent to her pharmacy. Bmet checked. Continue home BP monitoring and return in 4 weeks for reassessment. F/U sooner if BP worsens, or she becomes symptomatic. She agreed with the plan.  Restrictive lung disease Advair refilled. Continue Albuterol prn.    [name], MD [place]

## 2021-04-22 NOTE — Assessment & Plan Note (Signed)
Advair refilled. Continue Albuterol prn.

## 2021-04-23 LAB — BASIC METABOLIC PANEL
BUN/Creatinine Ratio: 12 (ref 12–28)
BUN: 13 mg/dL (ref 8–27)
CO2: 28 mmol/L (ref 20–29)
Calcium: 9.3 mg/dL (ref 8.7–10.3)
Chloride: 101 mmol/L (ref 96–106)
Creatinine, Ser: 1.05 mg/dL — ABNORMAL HIGH (ref 0.57–1.00)
Glucose: 87 mg/dL (ref 70–99)
Potassium: 5.3 mmol/L — ABNORMAL HIGH (ref 3.5–5.2)
Sodium: 142 mmol/L (ref 134–144)
eGFR: 59 mL/min/{1.73_m2} — ABNORMAL LOW (ref >59)

## 2021-04-26 ENCOUNTER — Other Ambulatory Visit: Payer: Self-pay | Admitting: Family Medicine

## 2021-04-26 ENCOUNTER — Telehealth: Payer: Self-pay | Admitting: Family Medicine

## 2021-04-26 ENCOUNTER — Ambulatory Visit: Payer: Medicare HMO

## 2021-04-26 DIAGNOSIS — E875 Hyperkalemia: Secondary | ICD-10-CM

## 2021-04-26 NOTE — Patient Instructions (Signed)
Visit Information  Andrea [name]  it was nice speaking with you. Please call me directly [phone] if you have questions about the goals we discussed.  Patient Goals/Self Care Activities: -Patient/Caregiver will self-administer medications as prescribed as evidenced by self-report/primary caregiver report  -Patient/Caregiver will attend all scheduled provider appointments as evidenced by clinician review of documented attendance to scheduled appointments and patient/caregiver report -Patient/Caregiver will call pharmacy for medication refills as evidenced by patient report and review of pharmacy fill history as appropriate -Patient/Caregiver will call provider office for new concerns or questions as evidenced by review of documented incoming telephone call notes and patient report -Patient/Caregiver verbalizes understanding of plan -Patient/Caregiver will focus on medication adherence by taking medications as prescribed -Calls provider office for new concerns, questions, or BP outside discussed parameters -Checks BP and records as discussed -Follows a low sodium diet/DASH diet -Patient will continue to cook healthy meals at home -Patient will follow up with the gym she has chosen   The patient verbalized understanding of instructions, educational materials, and care plan provided today and agreed to receive a mailed copy of patient instructions, educational materials, and care plan.   Follow up Plan: Patient would like continued follow-up.  CCM RNCM will outreach the patient within the next 4 weeks.  Patient will call office if needed prior to next encounter  Lazaro Arms, RN  [phone]

## 2021-04-26 NOTE — Telephone Encounter (Addendum)
Lab test collected on 04/22/21. Was not resulted until today 04/26/21.   Kidney function remains at her baseline. Potassium is mildly elevated. She denies chest pain, palpitation or SOB. I offered repeat lab today or tomorrow. She prefers to come in tomorrow. Lab appointment made. Future order placed. She denies use of OTC potassium supplement. She is on Aldactone which could cause hyperkalemia.

## 2021-04-26 NOTE — Chronic Care Management (AMB) (Signed)
Chronic Care Management   CCM RN Visit Note  04/26/2021 Name: Andrea [name] MRN: [id number] DOB: April 30, 1955  Subjective: Andrea [name] is a 66 y.o. year old female who is a primary care patient of [name], MD. The care management team was consulted for assistance with disease management and care coordination needs.    Engaged with patient by telephone for follow up visit in response to provider referral for case management and/or care coordination services.   Consent to Services:  The patient was given information about Chronic Care Management services, agreed to services, and gave verbal consent prior to initiation of services.  Please see initial visit note for detailed documentation.   Patient agreed to services and verbal consent obtained.    Summary: The patient is making significant efforts toward positive changes in her chronic conditions.. See Care Plan below for interventions and patient self-care actives.  Recommendation: The patient may benefit from continuing to her medications as prescribed, follow up with his gym membership and exercise as tolerated, check and record her blood pressures, monitor her diet and the patient agree.  Follow up Plan: Patient would like continued follow-up.  CCM RNCM will outreach the patient within the next 4 weeks  Patient will call office if needed prior to next encounter   Assessment: Review of patient past medical history, allergies, medications, health status, including review of consultants reports, laboratory and other test data, was performed as part of comprehensive evaluation and provision of chronic care management services.   SDOH (Social Determinants of Health) assessments and interventions performed:  No  CCM Care Plan  Conditions to be addressed/monitored:HTN  Care Plan : RN Case Management  Updates made by Lazaro Arms, RN since 04/26/2021 12:00 AM     Problem: Hypertension Management   Priority: High   Onset Date: 01/06/2019  Note:   Current Barriers:  Knowledge Deficits related to basic understanding of hypertension pathophysiology and self-care management   Nurse Case Manager Clinical Goal(s):    Over the next 90 days, patient will demonstrate improved adherence to prescribed treatment plan for hypertension as evidenced by taking all medications as prescribed, monitoring and recording blood pressure as directed, adhering to low sodium/DASH diet  Interventions: 1:1 collaboration with primary care provider regarding development and update of comprehensive plan of care as evidenced by provider attestation and co-signature Inter-disciplinary care team collaboration (see longitudinal plan of care) Evaluation of current treatment plan related to  self management and patient's adherence to plan as established by provider   Hypertension: (Status: Goal on track: NO.) [place] Last practice recorded BP readings:  BP Readings from Last 3 Encounters:  04/22/21 (!) 168/113  04/08/21 (!) 136/108  03/28/21 (!) 132/94  Most recent eGFR/CrCl:  Lab Results  Component Value Date   EGFR 59 (L) 04/22/2021    No components found for: CRCL   Evaluation of current treatment plan related to hypertension self-management and patient's adherence to plan as established by provider. Provided education to patient re: stroke prevention, s/s of heart attack and stroke, DASH diet, complications of uncontrolled blood pressure Reviewed medications with patient and discussed importance of compliance Discussed plans with patient for ongoing care management follow up and provided patient with direct contact information for care management team Advised patient, providing education and rationale, to monitor blood pressure daily and record, calling PCP for findings outside established parameters. -  healthy diet promoted -still monitor sodium in her food choices,  medication adherence assessment completed patient  is  taking her medications as prescribed  support and encouragement provided-  03/2821: Mrs. Twiggs states that her blood pressure has elevated, and she is unsure why. Her appointment with Dr. [name] on 04/22/21 was 168/113. Dr. [name] started her by adding Norvasc 5 mg daily to her BP regimen. She will continue Spironolactone 63m daily, two of your 25 mg tablets, Coreg 6.25 mg twice daily, and follow up with Dr. EGwendlyn Deutscherin 4 weeks 3,24/23. She has a blood pressure monitor and checks her blood pressure daily. We reviewed the proper way to check your pressure, and I will send her some educational material. The blood pressure today was 161/114. I advised her to continue her meds, drink water, and exercise as tolerated. She said she has joined the gym and is calling them today to see when to come in to go over the equipment.  Patient Goals/Self Care Activities: -Patient/Caregiver will self-administer medications as prescribed as evidenced by self-report/primary caregiver report  -Patient/Caregiver will attend all scheduled provider appointments as evidenced by clinician review of documented attendance to scheduled appointments and patient/caregiver report -Patient/Caregiver will call pharmacy for medication refills as evidenced by patient report and review of pharmacy fill history as appropriate -Patient/Caregiver will call provider office for new concerns or questions as evidenced by review of documented incoming telephone call notes and patient report -Patient/Caregiver verbalizes understanding of plan -Patient/Caregiver will focus on medication adherence by taking medications as prescribed -Calls provider office for new concerns, questions, or BP outside discussed parameters -Checks BP and records as discussed -Follows a low sodium diet/DASH diet -Patient will continue to cook healthy meals at home -Patient will follow up with the gym she has chosen      TLazaro ArmsRN, BSN, CMammothManagement  Coordinator CMartinez Phone: [phone]

## 2021-04-27 ENCOUNTER — Other Ambulatory Visit: Payer: Medicare HMO

## 2021-04-27 ENCOUNTER — Other Ambulatory Visit: Payer: Self-pay

## 2021-04-27 DIAGNOSIS — E875 Hyperkalemia: Secondary | ICD-10-CM | POA: Diagnosis not present

## 2021-04-28 ENCOUNTER — Telehealth: Payer: Self-pay | Admitting: Family Medicine

## 2021-04-28 LAB — BASIC METABOLIC PANEL
BUN/Creatinine Ratio: 12 (ref 12–28)
BUN: 12 mg/dL (ref 8–27)
CO2: 27 mmol/L (ref 20–29)
Calcium: 9.4 mg/dL (ref 8.7–10.3)
Chloride: 99 mmol/L (ref 96–106)
Creatinine, Ser: 1.01 mg/dL — ABNORMAL HIGH (ref 0.57–1.00)
Glucose: 90 mg/dL (ref 70–99)
Potassium: 4.5 mmol/L (ref 3.5–5.2)
Sodium: 137 mmol/L (ref 134–144)
eGFR: 62 mL/min/{1.73_m2} (ref >59)

## 2021-04-28 NOTE — Telephone Encounter (Signed)
Potassium level normalized. ?Result discussed with her. ?She was appreciative of the call. ?

## 2021-05-14 ENCOUNTER — Emergency Department (HOSPITAL_COMMUNITY): Payer: Medicare HMO

## 2021-05-14 ENCOUNTER — Other Ambulatory Visit: Payer: Self-pay

## 2021-05-14 ENCOUNTER — Encounter (HOSPITAL_COMMUNITY): Payer: Self-pay | Admitting: *Deleted

## 2021-05-14 ENCOUNTER — Encounter (HOSPITAL_COMMUNITY)
Admission: EM | Disposition: A | Discharge status: Skilled Nursing Facility | Payer: Self-pay | Source: Home / Self Care | Attending: Internal Medicine

## 2021-05-14 ENCOUNTER — Emergency Department (HOSPITAL_COMMUNITY): Payer: Medicare HMO | Admitting: Anesthesiology

## 2021-05-14 ENCOUNTER — Inpatient Hospital Stay (HOSPITAL_COMMUNITY)
Admission: EM | Admit: 2021-05-14 | Discharge: 2021-05-21 | DRG: 481 | Disposition: A | Discharge status: Skilled Nursing Facility | Payer: Medicare HMO | Attending: Internal Medicine | Admitting: Internal Medicine

## 2021-05-14 ENCOUNTER — Inpatient Hospital Stay (HOSPITAL_COMMUNITY): Payer: Medicare HMO

## 2021-05-14 DIAGNOSIS — E785 Hyperlipidemia, unspecified: Secondary | ICD-10-CM | POA: Diagnosis present

## 2021-05-14 DIAGNOSIS — D62 Acute posthemorrhagic anemia: Secondary | ICD-10-CM | POA: Diagnosis not present

## 2021-05-14 DIAGNOSIS — N182 Chronic kidney disease, stage 2 (mild): Secondary | ICD-10-CM | POA: Diagnosis present

## 2021-05-14 DIAGNOSIS — S7291XA Unspecified fracture of right femur, initial encounter for closed fracture: Secondary | ICD-10-CM

## 2021-05-14 DIAGNOSIS — S72491A Other fracture of lower end of right femur, initial encounter for closed fracture: Secondary | ICD-10-CM

## 2021-05-14 DIAGNOSIS — I129 Hypertensive chronic kidney disease with stage 1 through stage 4 chronic kidney disease, or unspecified chronic kidney disease: Secondary | ICD-10-CM | POA: Diagnosis not present

## 2021-05-14 DIAGNOSIS — J449 Chronic obstructive pulmonary disease, unspecified: Secondary | ICD-10-CM

## 2021-05-14 DIAGNOSIS — J984 Other disorders of lung: Secondary | ICD-10-CM | POA: Diagnosis present

## 2021-05-14 DIAGNOSIS — S72401A Unspecified fracture of lower end of right femur, initial encounter for closed fracture: Secondary | ICD-10-CM | POA: Diagnosis not present

## 2021-05-14 DIAGNOSIS — Z6841 Body Mass Index (BMI) 40.0 and over, adult: Secondary | ICD-10-CM | POA: Diagnosis not present

## 2021-05-14 DIAGNOSIS — R339 Retention of urine, unspecified: Secondary | ICD-10-CM | POA: Diagnosis not present

## 2021-05-14 DIAGNOSIS — E039 Hypothyroidism, unspecified: Secondary | ICD-10-CM | POA: Diagnosis not present

## 2021-05-14 DIAGNOSIS — I10 Essential (primary) hypertension: Secondary | ICD-10-CM | POA: Diagnosis present

## 2021-05-14 DIAGNOSIS — S7290XA Unspecified fracture of unspecified femur, initial encounter for closed fracture: Secondary | ICD-10-CM | POA: Diagnosis present

## 2021-05-14 DIAGNOSIS — R338 Other retention of urine: Secondary | ICD-10-CM | POA: Diagnosis not present

## 2021-05-14 DIAGNOSIS — Z8249 Family history of ischemic heart disease and other diseases of the circulatory system: Secondary | ICD-10-CM

## 2021-05-14 DIAGNOSIS — S7291XD Unspecified fracture of right femur, subsequent encounter for closed fracture with routine healing: Secondary | ICD-10-CM | POA: Diagnosis not present

## 2021-05-14 DIAGNOSIS — K5900 Constipation, unspecified: Secondary | ICD-10-CM | POA: Diagnosis not present

## 2021-05-14 DIAGNOSIS — S72351A Displaced comminuted fracture of shaft of right femur, initial encounter for closed fracture: Secondary | ICD-10-CM | POA: Diagnosis not present

## 2021-05-14 DIAGNOSIS — N289 Disorder of kidney and ureter, unspecified: Secondary | ICD-10-CM | POA: Diagnosis not present

## 2021-05-14 DIAGNOSIS — Y9234 Swimming pool (public) as the place of occurrence of the external cause: Secondary | ICD-10-CM | POA: Diagnosis not present

## 2021-05-14 DIAGNOSIS — S80919A Unspecified superficial injury of unspecified knee, initial encounter: Secondary | ICD-10-CM | POA: Diagnosis not present

## 2021-05-14 DIAGNOSIS — Z79899 Other long term (current) drug therapy: Secondary | ICD-10-CM | POA: Diagnosis not present

## 2021-05-14 DIAGNOSIS — Z743 Need for continuous supervision: Secondary | ICD-10-CM | POA: Diagnosis not present

## 2021-05-14 DIAGNOSIS — Z20822 Contact with and (suspected) exposure to covid-19: Secondary | ICD-10-CM | POA: Diagnosis present

## 2021-05-14 DIAGNOSIS — W108XXA Fall (on) (from) other stairs and steps, initial encounter: Secondary | ICD-10-CM | POA: Diagnosis present

## 2021-05-14 DIAGNOSIS — M7989 Other specified soft tissue disorders: Secondary | ICD-10-CM | POA: Diagnosis not present

## 2021-05-14 DIAGNOSIS — Z801 Family history of malignant neoplasm of trachea, bronchus and lung: Secondary | ICD-10-CM

## 2021-05-14 HISTORY — DX: Unspecified fracture of right femur, initial encounter for closed fracture: S72.91XA

## 2021-05-14 HISTORY — DX: Unspecified fracture of unspecified femur, initial encounter for closed fracture: S72.90XA

## 2021-05-14 HISTORY — PX: FEMUR IM NAIL: SHX1597

## 2021-05-14 LAB — BASIC METABOLIC PANEL
Anion gap: 6 (ref 5–15)
BUN: 33 mg/dL — ABNORMAL HIGH (ref 8–23)
CO2: 27 mmol/L (ref 22–32)
Calcium: 9 mg/dL (ref 8.9–10.3)
Chloride: 102 mmol/L (ref 98–111)
Creatinine, Ser: 1.08 mg/dL — ABNORMAL HIGH (ref 0.44–1.00)
GFR, Estimated: 57 mL/min — ABNORMAL LOW (ref >60)
Glucose, Bld: 112 mg/dL — ABNORMAL HIGH (ref 70–99)
Potassium: 4.6 mmol/L (ref 3.5–5.1)
Sodium: 135 mmol/L (ref 135–145)

## 2021-05-14 LAB — CBC WITH DIFFERENTIAL/PLATELET
Abs Immature Granulocytes: 0.05 10*3/uL (ref 0.00–0.07)
Basophils Absolute: 0 10*3/uL (ref 0.0–0.1)
Basophils Relative: 0 %
Eosinophils Absolute: 0 10*3/uL (ref 0.0–0.5)
Eosinophils Relative: 0 %
HCT: 39.6 % (ref 36.0–46.0)
Hemoglobin: 12.7 g/dL (ref 12.0–15.0)
Immature Granulocytes: 1 %
Lymphocytes Relative: 22 %
Lymphs Abs: 1.8 10*3/uL (ref 0.7–4.0)
MCH: 29.1 pg (ref 26.0–34.0)
MCHC: 32.1 g/dL (ref 30.0–36.0)
MCV: 90.6 fL (ref 80.0–100.0)
Monocytes Absolute: 0.5 10*3/uL (ref 0.1–1.0)
Monocytes Relative: 6 %
Neutro Abs: 5.8 10*3/uL (ref 1.7–7.7)
Neutrophils Relative %: 71 %
Platelets: 202 10*3/uL (ref 150–400)
RBC: 4.37 MIL/uL (ref 3.87–5.11)
RDW: 14.7 % (ref 11.5–15.5)
WBC: 8.2 10*3/uL (ref 4.0–10.5)
nRBC: 0 % (ref 0.0–0.2)

## 2021-05-14 LAB — RESP PANEL BY RT-PCR (FLU A&B, COVID) ARPGX2
Influenza A by PCR: NEGATIVE
Influenza B by PCR: NEGATIVE
SARS Coronavirus 2 by RT PCR: NEGATIVE

## 2021-05-14 SURGERY — INSERTION, INTRAMEDULLARY ROD, FEMUR, RETROGRADE
Anesthesia: General | Laterality: Right

## 2021-05-14 MED ORDER — METHOCARBAMOL 500 MG PO TABS
ORAL_TABLET | ORAL | Status: AC
  Filled 2021-05-14: qty 1

## 2021-05-14 MED ORDER — LACTATED RINGERS IV SOLN: INTRAVENOUS | Status: DC | PRN

## 2021-05-14 MED ORDER — OXYCODONE HCL 5 MG/5ML PO SOLN: 5.0000 mg | Freq: Once | ORAL | Status: AC | PRN

## 2021-05-14 MED ORDER — MORPHINE SULFATE (PF) 4 MG/ML IV SOLN
4.0000 mg | Freq: Once | INTRAVENOUS | Status: AC
  Administered 2021-05-14: 4 mg via INTRAVENOUS
  Filled 2021-05-14: qty 1

## 2021-05-14 MED ORDER — ROCURONIUM BROMIDE 10 MG/ML (PF) SYRINGE
PREFILLED_SYRINGE | INTRAVENOUS | Status: DC | PRN
  Administered 2021-05-14: 40 mg via INTRAVENOUS

## 2021-05-14 MED ORDER — LIDOCAINE 2% (20 MG/ML) 5 ML SYRINGE
INTRAMUSCULAR | Status: DC | PRN
Start: 2021-05-14 — End: 2021-05-14
  Administered 2021-05-14: 60 mg via INTRAVENOUS

## 2021-05-14 MED ORDER — BUPIVACAINE-EPINEPHRINE (PF) 0.25% -1:200000 IJ SOLN
INTRAMUSCULAR | Status: AC
  Filled 2021-05-14: qty 30

## 2021-05-14 MED ORDER — CEFAZOLIN IN SODIUM CHLORIDE 3-0.9 GM/100ML-% IV SOLN
INTRAVENOUS | Status: AC
  Filled 2021-05-14: qty 100

## 2021-05-14 MED ORDER — SIMVASTATIN 40 MG PO TABS: 40.0000 mg | ORAL_TABLET | Freq: Every day | ORAL | Status: DC

## 2021-05-14 MED ORDER — OXYCODONE HCL 5 MG PO TABS
ORAL_TABLET | ORAL | Status: AC
  Filled 2021-05-14: qty 1

## 2021-05-14 MED ORDER — DEXTROSE 5 % IV SOLN
INTRAVENOUS | Status: DC | PRN
  Administered 2021-05-14: 3 g via INTRAVENOUS

## 2021-05-14 MED ORDER — PHENYLEPHRINE HCL-NACL 20-0.9 MG/250ML-% IV SOLN
INTRAVENOUS | Status: DC | PRN
  Administered 2021-05-14: 15 ug/min via INTRAVENOUS

## 2021-05-14 MED ORDER — FENTANYL CITRATE (PF) 250 MCG/5ML IJ SOLN
INTRAMUSCULAR | Status: DC | PRN
  Administered 2021-05-14: 100 ug via INTRAVENOUS
  Administered 2021-05-14: 50 ug via INTRAVENOUS
  Administered 2021-05-14: 100 ug via INTRAVENOUS
  Administered 2021-05-14: 50 ug via INTRAVENOUS

## 2021-05-14 MED ORDER — LEVOTHYROXINE SODIUM 125 MCG PO TABS
125.0000 ug | ORAL_TABLET | Freq: Every day | ORAL | Status: DC
  Administered 2021-05-15 – 2021-05-21 (×7): 125 ug via ORAL
  Filled 2021-05-14 (×7): qty 1

## 2021-05-14 MED ORDER — ONDANSETRON HCL 4 MG/2ML IJ SOLN
INTRAMUSCULAR | Status: DC | PRN
  Administered 2021-05-14: 4 mg via INTRAVENOUS

## 2021-05-14 MED ORDER — SODIUM CHLORIDE 0.9 % IR SOLN
Status: DC | PRN
  Administered 2021-05-14: 1000 mL

## 2021-05-14 MED ORDER — SCOPOLAMINE 1 MG/3DAYS TD PT72
MEDICATED_PATCH | TRANSDERMAL | Status: AC
  Filled 2021-05-14: qty 1

## 2021-05-14 MED ORDER — MEPERIDINE HCL 50 MG/ML IJ SOLN: 6.2500 mg | INTRAMUSCULAR | Status: DC | PRN

## 2021-05-14 MED ORDER — MIDAZOLAM HCL 2 MG/2ML IJ SOLN: 0.5000 mg | Freq: Once | INTRAMUSCULAR | Status: DC | PRN

## 2021-05-14 MED ORDER — ACETAMINOPHEN 500 MG PO TABS
1000.0000 mg | ORAL_TABLET | Freq: Once | ORAL | Status: AC
  Administered 2021-05-14: 1000 mg via ORAL

## 2021-05-14 MED ORDER — FENTANYL CITRATE (PF) 250 MCG/5ML IJ SOLN
INTRAMUSCULAR | Status: AC
  Filled 2021-05-14: qty 5

## 2021-05-14 MED ORDER — SERTRALINE HCL 100 MG PO TABS
100.0000 mg | ORAL_TABLET | Freq: Every day | ORAL | Status: DC
  Administered 2021-05-15 – 2021-05-21 (×7): 100 mg via ORAL
  Filled 2021-05-14 (×7): qty 1

## 2021-05-14 MED ORDER — AMLODIPINE BESYLATE 5 MG PO TABS
5.0000 mg | ORAL_TABLET | Freq: Every day | ORAL | Status: DC
  Administered 2021-05-15: 5 mg via ORAL
  Filled 2021-05-14: qty 1

## 2021-05-14 MED ORDER — HYDROMORPHONE HCL 1 MG/ML IJ SOLN: 0.2500 mg | INTRAMUSCULAR | Status: DC | PRN

## 2021-05-14 MED ORDER — MIDAZOLAM HCL 5 MG/5ML IJ SOLN
INTRAMUSCULAR | Status: DC | PRN
  Administered 2021-05-14: 1 mg via INTRAVENOUS

## 2021-05-14 MED ORDER — OMEGA-3-ACID ETHYL ESTERS 1 G PO CAPS
1.0000 g | ORAL_CAPSULE | Freq: Two times a day (BID) | ORAL | Status: DC
  Administered 2021-05-15 – 2021-05-21 (×13): 1 g via ORAL
  Filled 2021-05-14 (×13): qty 1

## 2021-05-14 MED ORDER — ACETAMINOPHEN 500 MG PO TABS
ORAL_TABLET | ORAL | Status: AC
  Filled 2021-05-14: qty 2

## 2021-05-14 MED ORDER — OXYCODONE HCL 5 MG PO TABS
5.0000 mg | ORAL_TABLET | Freq: Once | ORAL | Status: AC | PRN
  Administered 2021-05-14: 5 mg via ORAL

## 2021-05-14 MED ORDER — METHOCARBAMOL 500 MG PO TABS
500.0000 mg | ORAL_TABLET | Freq: Four times a day (QID) | ORAL | Status: DC
  Administered 2021-05-14 – 2021-05-15 (×3): 500 mg via ORAL
  Administered 2021-05-15: 1000 mg via ORAL
  Administered 2021-05-16 – 2021-05-19 (×15): 500 mg via ORAL
  Administered 2021-05-19 – 2021-05-20 (×2): 1000 mg via ORAL
  Administered 2021-05-20 (×2): 500 mg via ORAL
  Administered 2021-05-21 (×4): 1000 mg via ORAL
  Filled 2021-05-14 (×2): qty 1
  Filled 2021-05-14: qty 2
  Filled 2021-05-14: qty 1
  Filled 2021-05-14: qty 2
  Filled 2021-05-14 (×2): qty 1
  Filled 2021-05-14 (×3): qty 2
  Filled 2021-05-14: qty 1
  Filled 2021-05-14: qty 2
  Filled 2021-05-14 (×8): qty 1
  Filled 2021-05-14: qty 2
  Filled 2021-05-14 (×6): qty 1

## 2021-05-14 MED ORDER — SCOPOLAMINE 1 MG/3DAYS TD PT72
1.0000 | MEDICATED_PATCH | Freq: Once | TRANSDERMAL | Status: AC
  Administered 2021-05-14: 1.5 mg via TRANSDERMAL

## 2021-05-14 MED ORDER — SPIRONOLACTONE 25 MG PO TABS
50.0000 mg | ORAL_TABLET | Freq: Every day | ORAL | Status: DC
  Administered 2021-05-15: 50 mg via ORAL
  Filled 2021-05-14: qty 2

## 2021-05-14 MED ORDER — SUCCINYLCHOLINE CHLORIDE 200 MG/10ML IV SOSY
PREFILLED_SYRINGE | INTRAVENOUS | Status: DC | PRN
  Administered 2021-05-14: 200 mg via INTRAVENOUS

## 2021-05-14 MED ORDER — DEXAMETHASONE SODIUM PHOSPHATE 10 MG/ML IJ SOLN
INTRAMUSCULAR | Status: DC | PRN
  Administered 2021-05-14: 10 mg via INTRAVENOUS

## 2021-05-14 MED ORDER — MIDAZOLAM HCL 2 MG/2ML IJ SOLN
INTRAMUSCULAR | Status: AC
  Filled 2021-05-14: qty 2

## 2021-05-14 MED ORDER — PHENYLEPHRINE 40 MCG/ML (10ML) SYRINGE FOR IV PUSH (FOR BLOOD PRESSURE SUPPORT)
PREFILLED_SYRINGE | INTRAVENOUS | Status: DC | PRN
Start: 2021-05-14 — End: 2021-05-14
  Administered 2021-05-14 (×3): 160 ug via INTRAVENOUS

## 2021-05-14 MED ORDER — ENOXAPARIN SODIUM 40 MG/0.4ML IJ SOSY
40.0000 mg | PREFILLED_SYRINGE | INTRAMUSCULAR | Status: DC
  Administered 2021-05-15 – 2021-05-19 (×5): 40 mg via SUBCUTANEOUS
  Filled 2021-05-14 (×6): qty 0.4

## 2021-05-14 MED ORDER — MOMETASONE FURO-FORMOTEROL FUM 200-5 MCG/ACT IN AERO
2.0000 | INHALATION_SPRAY | Freq: Two times a day (BID) | RESPIRATORY_TRACT | Status: DC
  Administered 2021-05-15 – 2021-05-21 (×13): 2 via RESPIRATORY_TRACT
  Filled 2021-05-14: qty 8.8

## 2021-05-14 MED ORDER — PHENYLEPHRINE HCL (PRESSORS) 10 MG/ML IV SOLN
INTRAVENOUS | Status: AC
  Filled 2021-05-14: qty 1

## 2021-05-14 MED ORDER — FAMOTIDINE 20 MG PO TABS
20.0000 mg | ORAL_TABLET | Freq: Every day | ORAL | Status: DC
  Administered 2021-05-15 – 2021-05-21 (×7): 20 mg via ORAL
  Filled 2021-05-14 (×7): qty 1

## 2021-05-14 MED ORDER — SUGAMMADEX SODIUM 500 MG/5ML IV SOLN
INTRAVENOUS | Status: DC | PRN
  Administered 2021-05-14: 400 mg via INTRAVENOUS

## 2021-05-14 MED ORDER — EPHEDRINE SULFATE-NACL 50-0.9 MG/10ML-% IV SOSY
PREFILLED_SYRINGE | INTRAVENOUS | Status: DC | PRN
  Administered 2021-05-14: 15 mg via INTRAVENOUS
  Administered 2021-05-14: 10 mg via INTRAVENOUS

## 2021-05-14 MED ORDER — BUPIVACAINE-EPINEPHRINE 0.25% -1:200000 IJ SOLN
INTRAMUSCULAR | Status: DC | PRN
  Administered 2021-05-14: 30 mL

## 2021-05-14 MED ORDER — CARVEDILOL 6.25 MG PO TABS
6.2500 mg | ORAL_TABLET | Freq: Two times a day (BID) | ORAL | Status: DC
Start: 2021-05-15 — End: 2021-05-21
  Administered 2021-05-15 – 2021-05-21 (×14): 6.25 mg via ORAL
  Filled 2021-05-14 (×14): qty 1

## 2021-05-14 MED ORDER — HYDROCODONE-ACETAMINOPHEN 5-325 MG PO TABS
1.0000 | ORAL_TABLET | ORAL | Status: DC | PRN
  Administered 2021-05-15 (×3): 1 via ORAL
  Administered 2021-05-16 – 2021-05-19 (×8): 2 via ORAL
  Administered 2021-05-19: 1 via ORAL
  Administered 2021-05-19 – 2021-05-20 (×2): 2 via ORAL
  Administered 2021-05-21: 1 via ORAL
  Filled 2021-05-14 (×4): qty 2
  Filled 2021-05-14 (×2): qty 1
  Filled 2021-05-14 (×2): qty 2
  Filled 2021-05-14: qty 1
  Filled 2021-05-14 (×4): qty 2
  Filled 2021-05-14: qty 1
  Filled 2021-05-14: qty 2
  Filled 2021-05-14: qty 1

## 2021-05-14 MED ORDER — FENTANYL CITRATE (PF) 250 MCG/5ML IJ SOLN
INTRAMUSCULAR | Status: AC
Start: 2021-05-14 — End: ?
  Filled 2021-05-14: qty 5

## 2021-05-14 MED ORDER — OXYCODONE-ACETAMINOPHEN 5-325 MG PO TABS
2.0000 | ORAL_TABLET | Freq: Once | ORAL | Status: DC
  Filled 2021-05-14: qty 2

## 2021-05-14 MED ORDER — PROPOFOL 10 MG/ML IV BOLUS
INTRAVENOUS | Status: AC
  Filled 2021-05-14: qty 20

## 2021-05-14 MED ORDER — PROPOFOL 10 MG/ML IV BOLUS
INTRAVENOUS | Status: DC | PRN
  Administered 2021-05-14: 200 mg via INTRAVENOUS

## 2021-05-14 SURGICAL SUPPLY — 65 items
BAG COUNTER SPONGE SURGICOUNT (BAG) IMPLANT
BAG ZIPLOCK 12X15 (MISCELLANEOUS) ×2 IMPLANT
BENZOIN TINCTURE PRP APPL 2/3 (GAUZE/BANDAGES/DRESSINGS) ×1 IMPLANT
BIT DRILL CALIBRATED 4.3MMX365 (DRILL) IMPLANT
BIT DRILL CROWE PNT TWST 4.5MM (DRILL) IMPLANT
BLADE SURG 15 STRL LF DISP TIS (BLADE) ×1 IMPLANT
BLADE SURG 15 STRL SS (BLADE) ×4
BNDG ELASTIC 6X10 VLCR STRL LF (GAUZE/BANDAGES/DRESSINGS) ×1 IMPLANT
COVER BACK TABLE 60X90IN (DRAPES) ×2 IMPLANT
COVER SURGICAL LIGHT HANDLE (MISCELLANEOUS) ×2 IMPLANT
DRAPE ORTHO SPLIT 77X108 STRL (DRAPES) ×4
DRAPE STERI IOBAN 125X83 (DRAPES) ×2 IMPLANT
DRAPE SURG ORHT 6 SPLT 77X108 (DRAPES) ×2 IMPLANT
DRILL CALIBRATED 4.3MMX365 (DRILL) ×2
DRILL CROWE POINT TWIST 4.5MM (DRILL) ×2
DRSG PAD ABDOMINAL 8X10 ST (GAUZE/BANDAGES/DRESSINGS) ×2 IMPLANT
DURAPREP 26ML APPLICATOR (WOUND CARE) ×4 IMPLANT
ELECT REM PT RETURN 15FT ADLT (MISCELLANEOUS) ×2 IMPLANT
GAUZE SPONGE 4X4 12PLY STRL (GAUZE/BANDAGES/DRESSINGS) ×3 IMPLANT
GAUZE XEROFORM 5X9 LF (GAUZE/BANDAGES/DRESSINGS) ×1 IMPLANT
GLOVE SRG 8 PF TXTR STRL LF DI (GLOVE) ×1 IMPLANT
GLOVE SURG NEOPR MICRO LF SZ8 (GLOVE) IMPLANT
GLOVE SURG ORTHO LTX SZ7.5 (GLOVE) ×4 IMPLANT
GLOVE SURG ORTHO LTX SZ8 (GLOVE) ×2 IMPLANT
GLOVE SURG POLYISO LF SZ6.5 (GLOVE) ×2 IMPLANT
GLOVE SURG UNDER POLY LF SZ6.5 (GLOVE) ×2 IMPLANT
GLOVE SURG UNDER POLY LF SZ7 (GLOVE) ×1 IMPLANT
GLOVE SURG UNDER POLY LF SZ7.5 (GLOVE) ×2 IMPLANT
GLOVE SURG UNDER POLY LF SZ8 (GLOVE) ×2
GOWN SPEC L3 XXLG W/TWL (GOWN DISPOSABLE) ×4 IMPLANT
GOWN STRL REUS W/ TWL LRG LVL3 (GOWN DISPOSABLE) IMPLANT
GOWN STRL REUS W/ TWL XL LVL3 (GOWN DISPOSABLE) ×1 IMPLANT
GOWN STRL REUS W/TWL LRG LVL3 (GOWN DISPOSABLE) ×2
GOWN STRL REUS W/TWL XL LVL3 (GOWN DISPOSABLE) ×2
GUIDEPIN VERSANAIL DSP 3.2X444 (ORTHOPEDIC DISPOSABLE SUPPLIES) ×1 IMPLANT
GUIDEWIRE BEAD TIP (WIRE) ×1 IMPLANT
KIT BASIN OR (CUSTOM PROCEDURE TRAY) ×2 IMPLANT
KIT TURNOVER KIT A (KITS) IMPLANT
MANIFOLD NEPTUNE II (INSTRUMENTS) ×2 IMPLANT
NAIL FEM RETRO 12X360 (Nail) ×1 IMPLANT
NDL SAFETY ECLIPSE 18X1.5 (NEEDLE) IMPLANT
NEEDLE HYPO 18GX1.5 SHARP (NEEDLE) ×2
NEEDLE HYPO 22GX1.5 SAFETY (NEEDLE) ×1 IMPLANT
NS IRRIG 1000ML POUR BTL (IV SOLUTION) ×2 IMPLANT
PACK GENERAL/GYN (CUSTOM PROCEDURE TRAY) ×2 IMPLANT
PROTECTOR NERVE ULNAR (MISCELLANEOUS) ×2 IMPLANT
SCREW BONE CORTICAL 5.0X32 (Screw) ×1 IMPLANT
SCREW CORT TI DBL LEAD 5X32 (Screw) ×1 IMPLANT
SCREW CORT TI DBL LEAD 5X70 (Screw) ×1 IMPLANT
SCREW CORT TI DBL LEAD 5X75 (Screw) ×1 IMPLANT
SCREW CORT TI DBLE LEAD 5X54 (Screw) ×1 IMPLANT
SET PAD KNEE POSITIONER (MISCELLANEOUS) ×2 IMPLANT
SPIKE FLUID TRANSFER (MISCELLANEOUS) ×1 IMPLANT
STAPLER VISISTAT 35W (STAPLE) ×3 IMPLANT
STRIP CLOSURE SKIN 1/2X4 (GAUZE/BANDAGES/DRESSINGS) ×1 IMPLANT
SUT MNCRL AB 4-0 PS2 18 (SUTURE) ×1 IMPLANT
SUT VIC AB 0 CT1 27 (SUTURE) ×6
SUT VIC AB 0 CT1 27XBRD ANTBC (SUTURE) ×1 IMPLANT
SUT VIC AB 2-0 CT1 18 (SUTURE) ×1 IMPLANT
SUT VIC AB 2-0 CT1 27 (SUTURE) ×6
SUT VIC AB 2-0 CT1 TAPERPNT 27 (SUTURE) ×2 IMPLANT
SYR CONTROL 10ML LL (SYRINGE) ×1 IMPLANT
TOWEL OR 17X26 10 PK STRL BLUE (TOWEL DISPOSABLE) ×4 IMPLANT
TRAY FOLEY MTR SLVR 16FR STAT (SET/KITS/TRAYS/PACK) ×1 IMPLANT
WATER STERILE IRR 1000ML POUR (IV SOLUTION) ×2 IMPLANT

## 2021-05-14 NOTE — ED Triage Notes (Signed)
BIB EMS going down steps in pool, twisted rt knee, has swelling and shortening with external rotation noted in triage, good pedal pulse, cap refills ?

## 2021-05-14 NOTE — Anesthesia Procedure Notes (Signed)
Procedure Name: Intubation ?Date/Time: 05/14/2021 6:56 PM ?Performed by: Cynda Familia, CRNA ?Pre-anesthesia Checklist: Patient identified, Emergency Drugs available, Suction available and Patient being monitored ?Patient Re-evaluated:Patient Re-evaluated prior to induction ?Oxygen Delivery Method: Circle System Utilized ?Preoxygenation: Pre-oxygenation with 100% oxygen ?Induction Type: IV induction and Rapid sequence ?Grade View: Grade I ?Tube type: Oral ?Number of attempts: 1 ?Airway Equipment and Method: Stylet and Video-laryngoscopy ?Placement Confirmation: ETT inserted through vocal cords under direct vision, positive ETCO2 and breath sounds checked- equal and bilateral ?Secured at: 21 cm ?Tube secured with: Tape ?Dental Injury: Teeth and Oropharynx as per pre-operative assessment  ?Difficulty Due To: Difficult Airway- due to dentition, Difficult Airway- due to reduced neck mobility, Difficult Airway- due to large tongue and Difficulty was anticipated ?Future Recommendations: Recommend- induction with short-acting agent, and alternative techniques readily available ?Comments: DIFFICULT INTUBATION-- IV induction Jackson- intubation AM CRNA atraumatic-- teeth as preop large chip on left front tooth- all teeth unchanged with laryngoscopy- bilat BS ? ? ? ? ?

## 2021-05-14 NOTE — Consult Note (Addendum)
Reason for Consult: Right leg pain ?Referring Physician: ED ? ?Andrea [name] is an 66 y.o. female.  ?HPI: Patient is a 66 year old female with a history of hypothyroidism, hyperlipidemia, morbid obesity, hypertension, CHF, who presented to the emergency department today S/P fall.  Patient states that she was going down steps in her local swimming pool and caught her right leg on the metal railing causing it to twist.  She reports exquisite pain around the right knee.  No numbness.  Denies any head trauma or LOC.  Denies being anticoagulated. Was found to have femur fracture and ortho was consulted.  She describes her pain as an aching sensation.  She does state that the pain had improved since her arrival to the emergency department. ? ?Past Medical History:  ?Diagnosis Date  ? Abnormal mammogram 08/28/2014  ? 08/28/14 - Probable benign microcalcifications over the outer mid to lower left breast. Bi-Rads 3. F/U 6 months   ? Allergic rhinitis 06/22/2014  ? Arm pain, lateral, left 04/28/2016  ? Carpal tunnel syndrome 04/26/2006  ? Qualifier: Diagnosis of  By: Dorathy Daft MD, Marjory Lies    ? First degree AV block 09/22/2013  ? EKG 09/22/13   ? GANGLION CYST, WRIST, RIGHT 10/06/2008  ? Qualifier: Diagnosis of  By: Drue Flirt  MD, Merrily Brittle    ? Imbalance 01/12/2017  ? Palpitations 09/22/2013  ? Right wrist pain 04/28/2016  ? ? ?Past Surgical History:  ?Procedure Laterality Date  ? ABDOMINAL HYSTERECTOMY    ? fibroids  ? ? ?Family History  ?Problem Relation Age of Onset  ? Cancer Mother   ?     cervial, pituitary, bone?  ? Hypertension Mother   ? Cancer Father   ? Alcohol abuse Father   ? Hypertension Sister   ? Hypothyroidism Sister   ? Cancer Brother   ?     lung  ? Hypertension Sister   ? Hypothyroidism Sister   ? Hypertension Sister   ? Hypothyroidism Sister   ? Hypertension Sister   ? Hypothyroidism Sister   ? Hypertension Sister   ? Hypothyroidism Sister   ? ? ?Social History:  reports that she has never smoked. She has never used  smokeless tobacco. She reports that she does not drink alcohol and does not use drugs. ? ?Allergies: No Known Allergies ? ?Medications:  ?Current Facility-Administered Medications:  ?  acetaminophen (TYLENOL) 500 MG tablet, , , ,  ?  scopolamine (TRANSDERM-SCOP) 1 MG/3DAYS 1.5 mg, 1 patch, Transdermal, Once, Annye Asa, MD, 1.5 mg at 05/14/21 1824 ?  scopolamine (TRANSDERM-SCOP) 1 MG/3DAYS, , , ,  ? ? ?Results for orders placed or performed during the hospital encounter of 05/14/21 (from the past 48 hour(s))  ?Basic metabolic panel     Status: Abnormal  ? Collection Time: 05/14/21  3:08 PM  ?Result Value Ref Range  ? Sodium 135 135 - 145 mmol/L  ? Potassium 4.6 3.5 - 5.1 mmol/L  ? Chloride 102 98 - 111 mmol/L  ? CO2 27 22 - 32 mmol/L  ? Glucose, Bld 112 (H) 70 - 99 mg/dL  ?  Comment: Glucose reference range applies only to samples taken after fasting for at least 8 hours.  ? BUN 33 (H) 8 - 23 mg/dL  ? Creatinine, Ser 1.08 (H) 0.44 - 1.00 mg/dL  ? Calcium 9.0 8.9 - 10.3 mg/dL  ? GFR, Estimated 57 (L) >60 mL/min  ?  Comment: (NOTE) ?Calculated using the CKD-EPI Creatinine Equation (2021) ?  ? Anion gap  6 5 - 15  ?  Comment: Performed at [facility], [place] [address], [place], [place] [address]  ?CBC with Differential     Status: None  ? Collection Time: 05/14/21  3:08 PM  ?Result Value Ref Range  ? WBC 8.2 4.0 - 10.5 K/uL  ? RBC 4.37 3.87 - 5.11 MIL/uL  ? Hemoglobin 12.7 12.0 - 15.0 g/dL  ? HCT 39.6 36.0 - 46.0 %  ? MCV 90.6 80.0 - 100.0 fL  ? MCH 29.1 26.0 - 34.0 pg  ? MCHC 32.1 30.0 - 36.0 g/dL  ? RDW 14.7 11.5 - 15.5 %  ? Platelets 202 150 - 400 K/uL  ? nRBC 0.0 0.0 - 0.2 %  ? Neutrophils Relative % 71 %  ? Neutro Abs 5.8 1.7 - 7.7 K/uL  ? Lymphocytes Relative 22 %  ? Lymphs Abs 1.8 0.7 - 4.0 K/uL  ? Monocytes Relative 6 %  ? Monocytes Absolute 0.5 0.1 - 1.0 K/uL  ? Eosinophils Relative 0 %  ? Eosinophils Absolute 0.0 0.0 - 0.5 K/uL  ? Basophils Relative 0 %  ? Basophils Absolute 0.0 0.0 -  0.1 K/uL  ? Immature Granulocytes 1 %  ? Abs Immature Granulocytes 0.05 0.00 - 0.07 K/uL  ?  Comment: Performed at [facility], [place] [address], [place], [place] [address]  ?Resp Panel by RT-PCR (Flu A&B, Covid) Nasopharyngeal Swab     Status: None  ? Collection Time: 05/14/21  3:08 PM  ? Specimen: Nasopharyngeal Swab; Nasopharyngeal(NP) swabs in vial transport medium  ?Result Value Ref Range  ? SARS Coronavirus 2 by RT PCR NEGATIVE NEGATIVE  ?  Comment: (NOTE) ?SARS-CoV-2 target nucleic acids are NOT DETECTED. ? ?The SARS-CoV-2 RNA is generally detectable in upper respiratory ?specimens during the acute phase of infection. The lowest ?concentration of SARS-CoV-2 viral copies this assay can detect is ?138 copies/mL. A negative result does not preclude SARS-Cov-2 ?infection and should not be used as the sole basis for treatment or ?other patient management decisions. A negative result may occur with  ?improper specimen collection/handling, submission of specimen other ?than nasopharyngeal swab, presence of viral mutation(s) within the ?areas targeted by this assay, and inadequate number of viral ?copies(<138 copies/mL). A negative result must be combined with ?clinical observations, patient history, and epidemiological ?information. The expected result is Negative. ? ?Fact Sheet for Patients:  ?EntrepreneurPulse.com.au ? ?Fact Sheet for Healthcare Providers:  ?IncredibleEmployment.be ? ?This test is no t yet approved or cleared by the [place] FDA and  ?has been authorized for detection and/or diagnosis of SARS-CoV-2 by ?FDA under an Emergency Use Authorization (EUA). This EUA will remain  ?in effect (meaning this test can be used) for the duration of the ?COVID-19 declaration under Section 564(b)(1) of the Act, 21 ?U.S.C.section 360bbb-3(b)(1), unless the authorization is terminated  ?or revoked sooner.  ? ? ?  ? Influenza A by PCR NEGATIVE NEGATIVE  ?  Influenza B by PCR NEGATIVE NEGATIVE  ?  Comment: (NOTE) ?The Xpert Xpress SARS-CoV-2/FLU/RSV plus assay is intended as an aid ?in the diagnosis of influenza from Nasopharyngeal swab specimens and ?should not be used as a sole basis for treatment. Nasal washings and ?aspirates are unacceptable for Xpert Xpress SARS-CoV-2/FLU/RSV ?testing. ? ?Fact Sheet for Patients: ?EntrepreneurPulse.com.au ? ?Fact Sheet for Healthcare Providers: ?IncredibleEmployment.be ? ?This test is not yet approved or cleared by the [place] FDA and ?has been authorized for detection and/or diagnosis of SARS-CoV-2 by ?FDA under  an Emergency Use Authorization (EUA). This EUA will remain ?in effect (meaning this test can be used) for the duration of the ?COVID-19 declaration under Section 564(b)(1) of the Act, 21 U.S.C. ?section 360bbb-3(b)(1), unless the authorization is terminated or ?revoked. ? ?Performed at [facility], [address] ?[place], [place] [address] ?  ? ? ?DG Knee 1-2 Views Right ? ?Result Date: 05/14/2021 ?CLINICAL DATA:  Pain and swelling after injury. EXAM: RIGHT KNEE - 1-2 VIEW COMPARISON:  None. FINDINGS: There is a comminuted displaced fracture through the distal femoral diaphysis, incompletely evaluated on frontal imaging. No other fractures are identified. No joint effusion. Degenerative changes. IMPRESSION: Comminuted displaced fracture through the distal femoral diaphysis, incompletely evaluated on frontal imaging. Recommend dedicated images of the femur. Electronically Signed   By: Dorise Bullion III M.D.   On: 05/14/2021 14:42  ? ?DG Femur Min 2 Views Right ? ?Result Date: 05/14/2021 ?CLINICAL DATA:  Trauma, pain EXAM: RIGHT FEMUR 2 VIEWS COMPARISON:  None. FINDINGS: There is comminuted fracture in the distal shaft of right femur. There is medial and posterior angulation at the fracture site. There is some overriding of fracture fragments. There is  posterior displacement of distal major fracture fragment in relation to the shaft. There is another large fracture fragment which is displaced anteriorly in relation to the mid shaft. IMPRESSION: Comminuted, angulat

## 2021-05-14 NOTE — H&P (Signed)
?History and Physical  ? ? ?[name]:[id number] DOB: 1955/09/01 DOA: 05/14/2021 ? ?PCP: [name], MD  ?Patient coming from: Home. ? ?Chief Complaint: Fall. ? ?HPI: Andrea [name] is a 66 y.o. female with history of hypertension, hyperlipidemia, hypothyroidism had a fall while trying to swim.  She did not hit her head.  But did hurt her right leg and was brought to the ER. ? ?ED Course: X-rays in the ER showed right distal femur fracture and was taken to the OR.  I examined the patient after patient came back from the OR.  Denies any chest pain or shortness of breath.  COVID test was negative.  Labs appear to be at baseline. ? ?Review of Systems: As per HPI, rest all negative. ? ? ?Past Medical History:  ?Diagnosis Date  ? Abnormal mammogram 08/28/2014  ? 08/28/14 - Probable benign microcalcifications over the outer mid to lower left breast. Bi-Rads 3. F/U 6 months   ? Allergic rhinitis 06/22/2014  ? Arm pain, lateral, left 04/28/2016  ? Carpal tunnel syndrome 04/26/2006  ? Qualifier: Diagnosis of  By: Dorathy Daft MD, Marjory Lies    ? First degree AV block 09/22/2013  ? EKG 09/22/13   ? GANGLION CYST, WRIST, RIGHT 10/06/2008  ? Qualifier: Diagnosis of  By: Drue Flirt  MD, Merrily Brittle    ? Imbalance 01/12/2017  ? Palpitations 09/22/2013  ? Right wrist pain 04/28/2016  ? ? ?Past Surgical History:  ?Procedure Laterality Date  ? ABDOMINAL HYSTERECTOMY    ? fibroids  ? ? ? reports that she has never smoked. She has never used smokeless tobacco. She reports that she does not drink alcohol and does not use drugs. ? ?No Known Allergies ? ?Family History  ?Problem Relation Age of Onset  ? Cancer Mother   ?     cervial, pituitary, bone?  ? Hypertension Mother   ? Cancer Father   ? Alcohol abuse Father   ? Hypertension Sister   ? Hypothyroidism Sister   ? Cancer Brother   ?     lung  ? Hypertension Sister   ? Hypothyroidism Sister   ? Hypertension Sister   ? Hypothyroidism Sister   ? Hypertension Sister   ? Hypothyroidism Sister    ? Hypertension Sister   ? Hypothyroidism Sister   ? ? ?Prior to Admission medications   ?Medication Sig Start Date End Date Taking? Authorizing Provider  ?albuterol (VENTOLIN HFA) 108 (90 Base) MCG/ACT inhaler Inhale 2 puffs into the lungs every 6 (six) hours as needed for wheezing or shortness of breath. ?Patient not taking: Reported on 03/25/2021 06/10/20   [name], MD  ?amLODipine (NORVASC) 5 MG tablet Take 1 tablet (5 mg total) by mouth at bedtime. 04/22/21   [name], MD  ?Blood Pressure Monitoring (BLOOD PRESSURE MONITOR/L CUFF) MISC Check blood pressure 3 times per week. 04/27/16   Lupita Dawn, MD  ?carvedilol (COREG) 6.25 MG tablet TAKE 1 TABLET BY MOUTH TWICE DAILY WITH A MEAL 09/16/20   [name], MD  ?famotidine (PEPCID) 20 MG tablet Take 1 tablet (20 mg total) by mouth daily. 04/08/21   [name], MD  ?fluticasone (FLONASE) 50 MCG/ACT nasal spray Place 1 spray into both nostrils daily. 1 spray in each nostril every day 09/10/19   Benay Pike, MD  ?fluticasone-salmeterol (ADVAIR [facility]) [phone] MCG/ACT inhaler Inhale 2 puffs into the lungs 2 (two) times daily. 04/22/21   [name], MD  ?levothyroxine (  SYNTHROID) 125 MCG tablet TAKE 1 TABLET BY MOUTH ONCE DAILY BEFORE BREAKFAST 10/13/20   [name], MD  ?loratadine (CLARITIN) 10 MG tablet Take 1 tablet (10 mg total) by mouth daily. 04/08/21   [name], MD  ?Omega-3 Fatty Acids (FISH OIL) 1000 MG CAPS Take 1 capsule (1,000 mg total) by mouth in the morning and at bedtime. ?Patient taking differently: Take 1 capsule by mouth daily. 08/04/20   [name], MD  ?sertraline (ZOLOFT) 50 MG tablet Take 2 tablets (100 mg total) by mouth daily. 04/08/21   [name], MD  ?simvastatin (ZOCOR) 40 MG tablet TAKE 1 TABLET BY MOUTH AT BEDTIME 06/25/20   [name], MD  ?spironolactone (ALDACTONE) 50 MG tablet Take 1 tablet (50 mg total) by mouth at bedtime. 04/22/21   [name], MD  ?Vitamin D,  Cholecalciferol, 50 MCG (2000 UT) CAPS Take 2,000 Units by mouth daily.    [provider]  ? ? ?Physical Exam: ?Constitutional: Moderately built and nourished. ?Vitals:  ? 05/14/21 2159 05/14/21 2200 05/14/21 2206 05/14/21 2235  ?BP:  105/82 116/81 (!) 129/92  ?Pulse: 70 70 67 64  ?Resp: '12 12 11 16  '$ ?Temp:   97.6 ?F (36.4 ?C) 97.9 ?F (36.6 ?C)  ?TempSrc:    Oral  ?SpO2: 99% 99% 100% 100%  ?Weight:      ?Height:      ? ?Eyes: Anicteric no pallor. ?ENMT: No discharge from the ears eyes nose and mouth. ?Neck: No mass felt.  No neck rigidity. ?Respiratory: No rhonchi or crepitations. ?Cardiovascular: S1-S2 heard. ?Abdomen: Soft nontender bowel sound present. ?Musculoskeletal: No edema.  Right leg is in dressing. ?Skin: No rash. ?Neurologic: Alert awake oriented time place and person.  Moves all extremities. ?Psychiatric: Appears normal.  Normal affect. ? ? ?Labs on Admission: I have personally reviewed following labs and imaging studies ? ?CBC: ?Recent Labs  ?Lab 05/14/21 ?1508  ?WBC 8.2  ?NEUTROABS 5.8  ?HGB 12.7  ?HCT 39.6  ?MCV 90.6  ?PLT 202  ? ?Basic Metabolic Panel: ?Recent Labs  ?Lab 05/14/21 ?1508  ?NA 135  ?K 4.6  ?CL 102  ?CO2 27  ?GLUCOSE 112*  ?BUN 33*  ?CREATININE 1.08*  ?CALCIUM 9.0  ? ?GFR: ?Estimated Creatinine Clearance: 66.5 mL/min (A) (by C-G formula based on SCr of 1.08 mg/dL (H)). ?Liver Function Tests: ?No results for input(s): AST, ALT, ALKPHOS, BILITOT, PROT, ALBUMIN in the last 168 hours. ?No results for input(s): LIPASE, AMYLASE in the last 168 hours. ?No results for input(s): AMMONIA in the last 168 hours. ?Coagulation Profile: ?No results for input(s): INR, PROTIME in the last 168 hours. ?Cardiac Enzymes: ?No results for input(s): CKTOTAL, CKMB, CKMBINDEX, TROPONINI in the last 168 hours. ?BNP (last 3 results) ?No results for input(s): PROBNP in the last 8760 hours. ?HbA1C: ?No results for input(s): HGBA1C in the last 72 hours. ?CBG: ?No results for input(s): GLUCAP in the last 168  hours. ?Lipid Profile: ?No results for input(s): CHOL, HDL, LDLCALC, TRIG, CHOLHDL, LDLDIRECT in the last 72 hours. ?Thyroid Function Tests: ?No results for input(s): TSH, T4TOTAL, FREET4, T3FREE, THYROIDAB in the last 72 hours. ?Anemia Panel: ?No results for input(s): VITAMINB12, FOLATE, FERRITIN, TIBC, IRON, RETICCTPCT in the last 72 hours. ?Urine analysis: ?No results found for: COLORURINE, APPEARANCEUR, [place], [place], [place], [place], [place], KETONESUR, PROTEINUR, [place], NITRITE, LEUKOCYTESUR ?Sepsis Labs: ?'@LABRCNTIP'$ (procalcitonin:4,lacticidven:4) ?) ?Recent Results (from the past 240 hour(s))  ?Resp Panel by RT-PCR (Flu A&B, Covid) Nasopharyngeal Swab  Status: None  ? Collection Time: 05/14/21  3:08 PM  ? Specimen: Nasopharyngeal Swab; Nasopharyngeal(NP) swabs in vial transport medium  ?Result Value Ref Range Status  ? SARS Coronavirus 2 by RT PCR NEGATIVE NEGATIVE Final  ?  Comment: (NOTE) ?SARS-CoV-2 target nucleic acids are NOT DETECTED. ? ?The SARS-CoV-2 RNA is generally detectable in upper respiratory ?specimens during the acute phase of infection. The lowest ?concentration of SARS-CoV-2 viral copies this assay can detect is ?138 copies/mL. A negative result does not preclude SARS-Cov-2 ?infection and should not be used as the sole basis for treatment or ?other patient management decisions. A negative result may occur with  ?improper specimen collection/handling, submission of specimen other ?than nasopharyngeal swab, presence of viral mutation(s) within the ?areas targeted by this assay, and inadequate number of viral ?copies(<138 copies/mL). A negative result must be combined with ?clinical observations, patient history, and epidemiological ?information. The expected result is Negative. ? ?Fact Sheet for Patients:  ?EntrepreneurPulse.com.au ? ?Fact Sheet for Healthcare Providers:  ?IncredibleEmployment.be ? ?This test is no t yet approved or cleared  by the [place] FDA and  ?has been authorized for detection and/or diagnosis of SARS-CoV-2 by ?FDA under an Emergency Use Authorization (EUA). This EUA will remain  ?in effect (meaning this tes

## 2021-05-14 NOTE — Anesthesia Postprocedure Evaluation (Signed)
Anesthesia Post Note ? ?Patient: Andrea [name] ? ?Procedure(s) Performed: INTRAMEDULLARY (IM) RETROGRADE FEMORAL NAILING (Right) ? ?  ? ?Patient location during evaluation: PACU ?Anesthesia Type: General ?Level of consciousness: awake and alert, oriented and patient cooperative ?Pain management: pain level controlled ?Vital Signs Assessment: post-procedure vital signs reviewed and stable ?Respiratory status: spontaneous breathing, nonlabored ventilation, respiratory function stable and patient connected to nasal cannula oxygen ?Cardiovascular status: blood pressure returned to baseline and stable ?Postop Assessment: no apparent nausea or vomiting ?Anesthetic complications: no ? ? ?No notable events documented. ? ?Last Vitals:  ?Vitals:  ? 05/14/21 2159 05/14/21 2200  ?BP:  105/82  ?Pulse: 70 70  ?Resp: 12 12  ?Temp:    ?SpO2: 99% 99%  ?  ?Last Pain:  ?Vitals:  ? 05/14/21 2159  ?TempSrc:   ?PainSc: 3   ? ? ?  ?  ?  ?  ?  ?  ? ?Rikki Smestad,E. Solae Norling ? ? ? ? ?

## 2021-05-14 NOTE — Anesthesia Preprocedure Evaluation (Addendum)
Anesthesia Evaluation  ?Patient identified by MRN, date of birth, ID band ?Patient awake ? ? ? ?Reviewed: ?Allergy & Precautions, NPO status , Patient's Chart, lab work & pertinent test results, reviewed documented beta blocker date and time  ? ?History of Anesthesia Complications ?Negative for: history of anesthetic complications ? ?Airway ?Mallampati: IV ? ?TM Distance: >3 FB ?Neck ROM: Full ? ? ? Dental ? ?(+) Dental Advisory Given, Poor Dentition, Missing, Chipped ?  ?Pulmonary ?asthma , COPD,  COPD inhaler,  ?  ?breath sounds clear to auscultation ? ? ? ? ? ? Cardiovascular ?hypertension, Pt. on medications and Pt. on home beta blockers ?(-) angina ?Rhythm:Regular Rate:Normal ? ?'21 ECHO: EF 55-60%. The LV has normal function, no regional wall motion abnormalities. Normal RVF, no significant valvular abnormalities ?  ?Neuro/Psych ?Depression negative neurological ROS ?   ? GI/Hepatic ?Neg liver ROS, GERD  Medicated and Controlled,  ?Endo/Other  ?Hypothyroidism Morbid obesity ? Renal/GU ?Renal InsufficiencyRenal disease  ? ?  ?Musculoskeletal ? ? Abdominal ?(+) + obese,   ?Peds ? Hematology ?negative hematology ROS ?(+)   ?Anesthesia Other Findings ? ? Reproductive/Obstetrics ? ?  ? ? ? ? ? ? ? ? ? ? ? ? ? ?  ?  ? ? ? ? ? ? ? ?Anesthesia Physical ?Anesthesia Plan ? ?ASA: 3 ? ?Anesthesia Plan: General  ? ?Post-op Pain Management: Tylenol PO (pre-op)*  ? ?Induction: Intravenous ? ?PONV Risk Score and Plan: 3 and Ondansetron, Dexamethasone and Scopolamine patch - Pre-op ? ?Airway Management Planned: Oral ETT and Video Laryngoscope Planned ? ?Additional Equipment: None ? ?Intra-op Plan:  ? ?Post-operative Plan: Extubation in OR ? ?Informed Consent: I have reviewed the patients History and Physical, chart, labs and discussed the procedure including the risks, benefits and alternatives for the proposed anesthesia with the patient or authorized representative who has indicated his/her  understanding and acceptance.  ? ? ? ?Dental advisory given ? ?Plan Discussed with: CRNA and Surgeon ? ?Anesthesia Plan Comments:   ? ? ? ? ? ?Anesthesia Quick Evaluation ? ?

## 2021-05-14 NOTE — ED Provider Notes (Signed)
?[facility] DEPT ?Provider Note ? ? ?CSN: [id number] ?Arrival date & time: 05/14/21  1339 ? ?  ? ?History ? ?Chief Complaint  ?Patient presents with  ? Knee Pain  ? ? ?Andrea [name] is a 66 y.o. female. ? ?HPI ?Patient is a 66 year old female with a history of hypothyroidism, hyperlipidemia, morbid obesity, hypertension, CHF, who presents to the emergency department due to a fall.  Patient states that she was going down steps in her local swimming pool and caught her right leg on the metal railing causing it to twist.  She reports exquisite pain around the right knee.  No numbness.  Denies any head trauma or LOC.  Denies being anticoagulated. ?  ? ?Home Medications ?Prior to Admission medications   ?Medication Sig Start Date End Date Taking? Authorizing Provider  ?albuterol (VENTOLIN HFA) 108 (90 Base) MCG/ACT inhaler Inhale 2 puffs into the lungs every 6 (six) hours as needed for wheezing or shortness of breath. ?Patient not taking: Reported on 03/25/2021 06/10/20   [name], MD  ?amLODipine (NORVASC) 5 MG tablet Take 1 tablet (5 mg total) by mouth at bedtime. 04/22/21   [name], MD  ?Blood Pressure Monitoring (BLOOD PRESSURE MONITOR/L CUFF) MISC Check blood pressure 3 times per week. 04/27/16   Lupita Dawn, MD  ?carvedilol (COREG) 6.25 MG tablet TAKE 1 TABLET BY MOUTH TWICE DAILY WITH A MEAL 09/16/20   [name], MD  ?famotidine (PEPCID) 20 MG tablet Take 1 tablet (20 mg total) by mouth daily. 04/08/21   [name], MD  ?fluticasone (FLONASE) 50 MCG/ACT nasal spray Place 1 spray into both nostrils daily. 1 spray in each nostril every day 09/10/19   Benay Pike, MD  ?fluticasone-salmeterol (ADVAIR [facility]) [phone] MCG/ACT inhaler Inhale 2 puffs into the lungs 2 (two) times daily. 04/22/21   [name], MD  ?levothyroxine (SYNTHROID) 125 MCG tablet TAKE 1 TABLET BY MOUTH ONCE DAILY BEFORE BREAKFAST 10/13/20   [name], MD  ?loratadine  (CLARITIN) 10 MG tablet Take 1 tablet (10 mg total) by mouth daily. 04/08/21   [name], MD  ?Omega-3 Fatty Acids (FISH OIL) 1000 MG CAPS Take 1 capsule (1,000 mg total) by mouth in the morning and at bedtime. ?Patient taking differently: Take 1 capsule by mouth daily. 08/04/20   [name], MD  ?sertraline (ZOLOFT) 50 MG tablet Take 2 tablets (100 mg total) by mouth daily. 04/08/21   [name], MD  ?simvastatin (ZOCOR) 40 MG tablet TAKE 1 TABLET BY MOUTH AT BEDTIME 06/25/20   [name], MD  ?spironolactone (ALDACTONE) 50 MG tablet Take 1 tablet (50 mg total) by mouth at bedtime. 04/22/21   [name], MD  ?Vitamin D, Cholecalciferol, 50 MCG (2000 UT) CAPS Take 2,000 Units by mouth daily.    [provider]  ?   ? ?Allergies    ?Patient has no known allergies.   ? ?Review of Systems   ?Review of Systems ?Ten systems reviewed and are negative for acute change, except as noted in the HPI.   ?Physical Exam ?Updated Vital Signs ?BP 125/86   Pulse 61   Temp 97.8 ?F (36.6 ?C) (Oral)   Resp 19   Ht '5\' 4"'$  (1.626 m)   Wt 120.7 kg   SpO2 100%   BMI 45.66 kg/m?  ?Physical Exam ?Vitals and nursing note reviewed.  ?Constitutional:   ?   General: She is not  in acute distress. ?   Appearance: Normal appearance. She is not ill-appearing, toxic-appearing or diaphoretic.  ?HENT:  ?   Head: Normocephalic and atraumatic.  ?   Right Ear: External ear normal.  ?   Left Ear: External ear normal.  ?   Nose: Nose normal.  ?   Mouth/Throat:  ?   Mouth: Mucous membranes are moist.  ?   Pharynx: Oropharynx is clear. No oropharyngeal exudate or posterior oropharyngeal erythema.  ?Eyes:  ?   Extraocular Movements: Extraocular movements intact.  ?Cardiovascular:  ?   Rate and Rhythm: Normal rate and regular rhythm.  ?   Pulses: Normal pulses.  ?   Heart sounds: Normal heart sounds. No murmur heard. ?  No friction rub. No gallop.  ?Pulmonary:  ?   Effort: Pulmonary effort is normal. No respiratory  distress.  ?   Breath sounds: Normal breath sounds. No stridor. No wheezing, rhonchi or rales.  ?Abdominal:  ?   General: Abdomen is flat.  ?   Palpations: Abdomen is soft.  ?   Tenderness: There is no abdominal tenderness.  ?Musculoskeletal:     ?   General: Tenderness, deformity and signs of injury present. Normal range of motion.  ?   Cervical back: Normal range of motion and neck supple. No tenderness.  ?   Comments: Right leg: leg is shortened and externally rotated.  Exquisite tenderness noted along the right anterior knee as well as just proximal to the knee.  Difficult to assess due to patient's pain as well as body habitus.  Distal sensation is intact.  2+ pedal pulses.  Wiggling the toes of the right foot without difficulty.  No tenderness appreciated in the right ankle or pelvis.  ?Skin: ?   General: Skin is warm and dry.  ?Neurological:  ?   General: No focal deficit present.  ?   Mental Status: She is alert and oriented to person, place, and time.  ?Psychiatric:     ?   Mood and Affect: Mood normal.     ?   Behavior: Behavior normal.  ? ?ED Results / Procedures / Treatments   ?Labs ?(all labs ordered are listed, but only abnormal results are displayed) ?Labs Reviewed  ?BASIC METABOLIC PANEL - Abnormal; Notable for the following components:  ?    Result Value  ? Glucose, Bld 112 (*)   ? BUN 33 (*)   ? Creatinine, Ser 1.08 (*)   ? GFR, Estimated 57 (*)   ? All other components within normal limits  ?RESP PANEL BY RT-PCR (FLU A&B, COVID) ARPGX2  ?CBC WITH DIFFERENTIAL/PLATELET  ? ? ?EKG ?None ? ?Radiology ?DG Knee 1-2 Views Right ? ?Result Date: 05/14/2021 ?CLINICAL DATA:  Pain and swelling after injury. EXAM: RIGHT KNEE - 1-2 VIEW COMPARISON:  None. FINDINGS: There is a comminuted displaced fracture through the distal femoral diaphysis, incompletely evaluated on frontal imaging. No other fractures are identified. No joint effusion. Degenerative changes. IMPRESSION: Comminuted displaced fracture through the  distal femoral diaphysis, incompletely evaluated on frontal imaging. Recommend dedicated images of the femur. Electronically Signed   By: Dorise Bullion III M.D.   On: 05/14/2021 14:42  ? ?DG Femur Min 2 Views Right ? ?Result Date: 05/14/2021 ?CLINICAL DATA:  Trauma, pain EXAM: RIGHT FEMUR 2 VIEWS COMPARISON:  None. FINDINGS: There is comminuted fracture in the distal shaft of right femur. There is medial and posterior angulation at the fracture site. There is some overriding of fracture fragments.  There is posterior displacement of distal major fracture fragment in relation to the shaft. There is another large fracture fragment which is displaced anteriorly in relation to the mid shaft. IMPRESSION: Comminuted, angulated and displaced fracture is seen in the distal shaft of right femur. Electronically Signed   By: Elmer Picker M.D.   On: 05/14/2021 16:10   ? ?Procedures ?Procedures  ? ?Medications Ordered in ED ?Medications  ?morphine (PF) 4 MG/ML injection 4 mg (4 mg Intravenous Given 05/14/21 1532)  ? ? ?ED Course/ Medical Decision Making/ A&P ?Clinical Course as of 05/14/21 1737  ?Sat May 14, 2021  ?1506 DG Knee 1-2 Views Right [LJ]  ?  ?Clinical Course User Index ?[LJ] Rayna Sexton, PA-C  ? ?                        ?Medical Decision Making ?Amount and/or Complexity of Data Reviewed ?Labs: ordered. ?Radiology: ordered. Decision-making details documented in ED Course. ? ?Risk ?Prescription drug management. ?Decision regarding hospitalization. ? ? ?Pt is a 66 y.o. female who presents to the emergency department due to a fall that occurred prior to arrival while getting into a swimming pool. ? ?Labs: ?CBC without abnormalities. ?BMP with a glucose of 112, BUN of 33, creatinine 1.08, GFR 57. ?Respiratory panel is pending. ? ?Imaging: ?X-ray of the right knee shows a comminuted displaced fracture through the distal femoral diaphysis, incompletely evaluated on frontal imaging.  They recommend dedicated images  of the femur. ? ?X-rays of the right femur show a comminuted, angulated, and displaced fracture along the distal shaft of the right femur. ? ?I, Rayna Sexton, PA-C, personally reviewed and evaluated these ima

## 2021-05-14 NOTE — ED Provider Triage Note (Signed)
Emergency Medicine Provider Triage Evaluation Note ? ?Andrea [name] , a 66 y.o. female  was evaluated in triage.  Pt complains of knee injury. ? ?Review of Systems  ?Positive: R knee injury ?Negative: Head injury, neck pain, hip or ankle pain, numbness ? ?Physical Exam  ?BP (!) 149/97 (BP Location: Right Arm)   Pulse (!) 58   Temp 97.8 ?F (36.6 ?C) (Oral)   Resp 18   Ht '5\' 4"'$  (1.626 m)   Wt 120.7 kg   SpO2 96%   BMI 45.66 kg/m?  ?Gen:   Awake, no distress   ?Resp:  Normal effort  ?MSK:   Moves extremities without difficulty  ?Other:   ? ?Medical Decision Making  ?Medically screening exam initiated at 2:12 PM.  Appropriate orders placed.  Andrea [name] was informed that the remainder of the evaluation will be completed by another provider, this initial triage assessment does not replace that evaluation, and the importance of remaining in the ED until their evaluation is complete. ? ?Pt was trying to get into swimming pool when her leg got caught and she twisted her R knee.  Felt a pop follows with severe pain about the knee.  ?  ?Domenic Moras, PA-C ?05/14/21 1414 ? ?

## 2021-05-14 NOTE — Anesthesia Procedure Notes (Signed)
Date/Time: 05/14/2021 9:22 PM ?Performed by: Cynda Familia, CRNA ?Oxygen Delivery Method: Simple face mask ?Placement Confirmation: breath sounds checked- equal and bilateral and positive ETCO2 ?Dental Injury: Teeth and Oropharynx as per pre-operative assessment  ? ? ? ? ?

## 2021-05-14 NOTE — Op Note (Signed)
PATIENT NAME: Andrea [name]  ? ?MEDICAL RECORD NO.:   [id number]  ?  ?DATE OF BIRTH: 07-17-1955  ? ?DATE OF PROCEDURE: 05/14/2021  ? ? ?OPERATIVE REPORT ? ? ?PREOPERATIVE DIAGNOSES: ?1.  Comminuted, angled, displaced right distal femur fracture ? ?POSTOPERATIVE DIAGNOSES: ?1.  Comminuted, angled, displaced right distal femur fracture ? ?PROCEDURE:  ? ?Closed reduction and retrograde intramedullary fixation of right distal femur fracture ? ?SURGEON:  Phylliss Bob, MD. ? ?ASSISTANT:  Pricilla Holm, PA-C. ? ?ANESTHESIA:  General endotracheal anesthesia. ? ?COMPLICATIONS:  None. ? ?DISPOSITION:  Stable. ? ?ESTIMATED BLOOD LOSS:  Minimal. ? ?INDICATIONS FOR SURGERY:  Briefly, Andrea [name] is a very pleasant 66 y.o.  ?year-old female, who unfortunately fell while twisting her right leg when at the pool earlier today.  She sustained a rather significant right distal femur fracture, which was noted to be comminuted, and significantly angled and significantly displaced.  Given this, I did evaluate her and discuss the utility of operative intervention.  She did understand that without operative intervention, she would not be able to functionally bear weight on her right leg.  I did have an extensive discussion with the patient regarding the risks of surgery, and she did wish to proceed. ? ?OPERATIVE DETAILS:  On 05/14/2021, the patient was brought to surgery ?and general endotracheal anesthesia was administered.  The patient was placed supine on a radiolucent table.  A bump was placed under her right buttock.  The right lower extremity was prepped and draped in the usual sterile fashion, and a timeout procedure was performed.  At this point, an incision was made over the region of the right patellar tendon.  The tendon was split in line with its fibers at the mid aspect of the tendon.  At this point, a guidewire was advanced through the distal aspect of the femur, with an entry point at the intercondylar notch.  The entry  point was anterior and lateral to the femoral attachment of the posterior cruciate ligament.  I was very pleased with the entry point on AP and lateral views.  The guidewire was then advanced proximally, and then used the entry reamer over the guidewire.  I then used a ball-tipped guidewire through the entry hole, which was advanced approximately, across the fracture site, to the region of the lesser trochanter.  Reducing the fracture was rather difficult, given the patient's large body habitus, and the displacement and angulation of the fracture fragments.  I was however able to gain adequate reduction of the fracture fragments, after which point a reamer was advanced approximately, across the fracture site, to the lesser trochanter.  I did ream up to 13.5 mm, and did ultimately elect to utilize a 12 x 360 length rod.  The guidewire was then removed, and the rod was then advanced across the fracture site, to just proximal to the lesser trochanter.  I was pleased with the alignment of the fracture fragments and the length of the femur.  Some displacement of the comminuted fragments did remain, however, again, I was very pleased with the overall alignment.  I then advanced 3 interlocking screws at the distal aspect of the rod, from lateral to medial, and 2 interlocking screws at the proximal aspect of the rod, anterior to posterior.  The rod was statically locked proximally.  The wounds were then copiously irrigated.  The peritenon was closed using 0 Vicryl, and the subcutaneous layer and the knee wound was closed using 2-0 Vicryl and 4-0 Monocryl.  The stab incisions were closed using staples. All instrument counts were correct at the termination ?of the procedure. ? ?Of note, Pricilla Holm, PA-C, was my assistant throughout surgery, and ?did aid in retraction, suctioning, and closure from start to finish. ? ? ?Phylliss Bob, MD  ?

## 2021-05-14 NOTE — Transfer of Care (Signed)
Immediate Anesthesia Transfer of Care Note ? ?Patient: Andrea [name] ? ?Procedure(s) Performed: INTRAMEDULLARY (IM) RETROGRADE FEMORAL NAILING (Right) ? ?Patient Location: PACU ? ?Anesthesia Type:General ? ?Level of Consciousness: awake ? ?Airway & Oxygen Therapy: Patient Spontanous Breathing and Patient connected to face mask oxygen ? ?Post-op Assessment: Report given to RN and Post -op Vital signs reviewed and stable ? ?Post vital signs: Reviewed and stable ? ?Last Vitals:  ?Vitals Value Taken Time  ?BP 123/92 05/14/21 2130  ?Temp    ?Pulse 75 05/14/21 2132  ?Resp 11 05/14/21 2132  ?SpO2 100 % 05/14/21 2132  ?Vitals shown include unvalidated device data. ? ?Last Pain:  ?Vitals:  ? 05/14/21 1401  ?TempSrc:   ?PainSc: 10-Worst pain ever  ?   ? ?  ? ?Complications: No notable events documented. ?

## 2021-05-15 DIAGNOSIS — R338 Other retention of urine: Secondary | ICD-10-CM | POA: Diagnosis not present

## 2021-05-15 LAB — BASIC METABOLIC PANEL
Anion gap: 9 (ref 5–15)
BUN: 36 mg/dL — ABNORMAL HIGH (ref 8–23)
CO2: 24 mmol/L (ref 22–32)
Calcium: 8.7 mg/dL — ABNORMAL LOW (ref 8.9–10.3)
Chloride: 105 mmol/L (ref 98–111)
Creatinine, Ser: 1.08 mg/dL — ABNORMAL HIGH (ref 0.44–1.00)
GFR, Estimated: 57 mL/min — ABNORMAL LOW (ref >60)
Glucose, Bld: 147 mg/dL — ABNORMAL HIGH (ref 70–99)
Potassium: 5 mmol/L (ref 3.5–5.1)
Sodium: 138 mmol/L (ref 135–145)

## 2021-05-15 LAB — CBC
HCT: 34.4 % — ABNORMAL LOW (ref 36.0–46.0)
Hemoglobin: 11.2 g/dL — ABNORMAL LOW (ref 12.0–15.0)
MCH: 29.6 pg (ref 26.0–34.0)
MCHC: 32.6 g/dL (ref 30.0–36.0)
MCV: 90.8 fL (ref 80.0–100.0)
Platelets: 191 10*3/uL (ref 150–400)
RBC: 3.79 MIL/uL — ABNORMAL LOW (ref 3.87–5.11)
RDW: 15 % (ref 11.5–15.5)
WBC: 10.7 10*3/uL — ABNORMAL HIGH (ref 4.0–10.5)
nRBC: 0 % (ref 0.0–0.2)

## 2021-05-15 LAB — HIV ANTIBODY (ROUTINE TESTING W REFLEX): HIV Screen 4th Generation wRfx: NONREACTIVE

## 2021-05-15 MED ORDER — PHENYLEPHRINE 40 MCG/ML (10ML) SYRINGE FOR IV PUSH (FOR BLOOD PRESSURE SUPPORT)
PREFILLED_SYRINGE | INTRAVENOUS | Status: AC
  Filled 2021-05-15: qty 10

## 2021-05-15 MED ORDER — ATORVASTATIN CALCIUM 20 MG PO TABS
20.0000 mg | ORAL_TABLET | Freq: Every day | ORAL | Status: DC
  Administered 2021-05-15 – 2021-05-21 (×7): 20 mg via ORAL
  Filled 2021-05-15 (×7): qty 1

## 2021-05-15 MED ORDER — EPHEDRINE 5 MG/ML INJ
INTRAVENOUS | Status: AC
  Filled 2021-05-15: qty 5

## 2021-05-15 MED ORDER — ETOMIDATE 2 MG/ML IV SOLN
INTRAVENOUS | Status: AC
  Filled 2021-05-15: qty 10

## 2021-05-15 MED ORDER — ROCURONIUM BROMIDE 10 MG/ML (PF) SYRINGE
PREFILLED_SYRINGE | INTRAVENOUS | Status: AC
  Filled 2021-05-15: qty 10

## 2021-05-15 MED ORDER — SUCCINYLCHOLINE CHLORIDE 200 MG/10ML IV SOSY
PREFILLED_SYRINGE | INTRAVENOUS | Status: AC
  Filled 2021-05-15: qty 10

## 2021-05-15 NOTE — Plan of Care (Signed)
  Problem: Education: Goal: Knowledge of General Education information will improve Description: Including pain rating scale, medication(s)/side effects and non-pharmacologic comfort measures Outcome: Progressing   Problem: Pain Managment: Goal: General experience of comfort will improve Outcome: Progressing   Problem: Safety: Goal: Ability to remain free from injury will improve Outcome: Progressing   

## 2021-05-15 NOTE — Plan of Care (Signed)
  Problem: Education: Goal: Knowledge of General Education information will improve Description Including pain rating scale, medication(s)/side effects and non-pharmacologic comfort measures Outcome: Progressing   

## 2021-05-15 NOTE — Evaluation (Signed)
Occupational Therapy Evaluation ?Patient Details ?Name: Andrea [name] ?MRN: [id number] ?DOB: [birth year] ?Today's Date: 05/15/2021 ? ? ?History of Present Illness Pt admitted s/p swimming pool injury with R distal femur fx and now s/p IM fixation.  Pt with hx of htn and restrictive lung disease  ? ?Clinical Impression ?  ?Patient is a 66 year old female who was admitted for above. Patient was noted to have had a significant decline in ability to engage in ADLs. Patient was noted to have increased pain, decreased WB status on RLE, decreased activity tolerance, decreased endurance, decreased standing balance, decreased knowledge of AE/AD imapcting participation in ADLs. Patient would continue to benefit from skilled OT services at this time while admitted and after d/c to address noted deficits in order to improve overall safety and independence in ADLs.  ?  ?   ? ?Recommendations for follow up therapy are one component of a multi-disciplinary discharge planning process, led by the attending physician.  Recommendations may be updated based on patient status, additional functional criteria and insurance authorization.  ? ?Follow Up Recommendations ? Skilled nursing-short term rehab (<3 hours/day)  ?  ?Assistance Recommended at Discharge Frequent or constant Supervision/Assistance  ?Patient can return home with the following Two people to help with walking and/or transfers;Two people to help with bathing/dressing/bathroom;Direct supervision/assist for medications management;Help with stairs or ramp for entrance;Assist for transportation;Direct supervision/assist for financial management;Assistance with cooking/housework ? ?  ?Functional Status Assessment ? Patient has had a recent decline in their functional status and demonstrates the ability to make significant improvements in function in a reasonable and predictable amount of time.  ?Equipment Recommendations ? Other (comment) (RW and drop arm bedside commode)  ?   ?Recommendations for Other Services   ? ? ?  ?Precautions / Restrictions Precautions ?Precautions: Fall ?Restrictions ?Weight Bearing Restrictions: Yes ?RLE Weight Bearing: Touchdown weight bearing  ? ?  ? ?Mobility Bed Mobility ?Overal bed mobility: Needs Assistance ?Bed Mobility: Supine to Sit, Sit to Supine ?  ?  ?Supine to sit: Mod assist, Max assist, +2 for physical assistance, +2 for safety/equipment ?Sit to supine: Mod assist, Max assist, +2 for physical assistance, +2 for safety/equipment ?  ?General bed mobility comments: Increased time, use of bedrails, physical assist to manage L LE, control trunk and rotate to/from EOB with use of bed pad ?  ? ?Transfers ?  ?  ?  ?  ?  ?  ?  ?  ?  ?  ?  ? ?  ?Balance Overall balance assessment: Needs assistance ?Sitting-balance support: Feet supported, No upper extremity supported ?Sitting balance-Leahy Scale: Good ?  ?  ?Standing balance support: Bilateral upper extremity supported ?Standing balance-Leahy Scale: Poor ?  ?  ?  ?  ?  ?  ?  ?  ?  ?  ?  ?  ?   ? ?ADL either performed or assessed with clinical judgement  ? ?ADL Overall ADL's : Needs assistance/impaired ?Eating/Feeding: Modified independent;Sitting ?Eating/Feeding Details (indicate cue type and reason): in bed ?Grooming: Dance movement psychotherapist;Set up;Sitting ?Grooming Details (indicate cue type and reason): EOB ?Upper Body Bathing: Set up;Sitting ?Upper Body Bathing Details (indicate cue type and reason): EOB ?Lower Body Bathing: Total assistance;Sit to/from stand;Sitting/lateral leans ?  ?Upper Body Dressing : Set up;Sitting ?Upper Body Dressing Details (indicate cue type and reason): EOB ?Lower Body Dressing: Total assistance;Sit to/from stand;Sitting/lateral leans;Bed level ?  ?Toilet Transfer: +2 for physical assistance;+2 for safety/equipment ?Toilet Transfer Details (indicate cue type and reason):  patient is TDWB with attempts at standing with increased cues to keep weight off RLE. unable to attempt steps  today ?Toileting- Clothing Manipulation and Hygiene: Total assistance;Sitting/lateral lean;Sit to/from stand ?  ?  ?  ?Functional mobility during ADLs: +2 for physical assistance;+2 for safety/equipment ?   ? ? ? ?Vision Baseline Vision/History: 1 Wears glasses ?Patient Visual Report: No change from baseline ?   ?   ?Perception   ?  ?Praxis   ?  ? ?Pertinent Vitals/Pain Pain Assessment ?Pain Assessment: Faces ?Faces Pain Scale: Hurts whole lot ?Pain Location: R knee ?Pain Descriptors / Indicators: Aching, Grimacing, Guarding, Sore ?Pain Intervention(s): Limited activity within patient's tolerance, Monitored during session, Repositioned  ? ? ? ?Hand Dominance Right ?  ?Extremity/Trunk Assessment Upper Extremity Assessment ?Upper Extremity Assessment: Overall WFL for tasks assessed ?  ?Lower Extremity Assessment ?Lower Extremity Assessment: Defer to PT evaluation ?RLE: Unable to fully assess due to pain ?  ?Cervical / Trunk Assessment ?Cervical / Trunk Assessment: Normal ?  ?Communication Communication ?Communication: No difficulties ?  ?Cognition Arousal/Alertness: Awake/alert ?Behavior During Therapy: [facility] for tasks assessed/performed ?Overall Cognitive Status: Within Functional Limits for tasks assessed ?  ?  ?  ?  ?  ?  ?  ?  ?  ?  ?  ?  ?  ?  ?  ?  ?  ?  ?  ?General Comments    ? ?  ?Exercises   ?  ?Shoulder Instructions    ? ? ?Home Living Family/patient expects to be discharged to:: Private residence ?Living Arrangements: Other relatives (grandson lives there but works during the day) ?Available Help at Discharge: Available PRN/intermittently;Family ?Type of Home: Apartment ?Home Access: Stairs to enter ?Entrance Stairs-Number of Steps: 3 ?  ?Home Layout: One level ?  ?  ?  ?  ?Bathroom Toilet: Standard ?  ?  ?Home Equipment: None ?  ?  ?  ? ?  ?Prior Functioning/Environment Prior Level of Function : Independent/Modified Independent ?  ?  ?  ?  ?  ?  ?  ?  ?  ? ?  ?  ?OT Problem List: Decreased activity  tolerance;Impaired balance (sitting and/or standing);Decreased safety awareness;Decreased knowledge of precautions;Decreased knowledge of use of DME or AE;Pain;Obesity ?  ?   ?OT Treatment/Interventions: Self-care/ADL training;Neuromuscular education;Therapeutic exercise;DME and/or AE instruction;Energy conservation;Therapeutic activities;Balance training;Patient/family education  ?  ?OT Goals(Current goals can be found in the care plan section) Acute Rehab OT Goals ?Patient Stated Goal: to get better ?OT Goal Formulation: With patient ?Time For Goal Achievement: 05/29/21 ?Potential to Achieve Goals: Good  ?OT Frequency: Min 2X/week ?  ? ?Co-evaluation PT/OT/SLP Co-Evaluation/Treatment: Yes ?Reason for Co-Treatment: For patient/therapist safety;To address functional/ADL transfers ?PT goals addressed during session: Mobility/safety with mobility ?OT goals addressed during session: ADL's and self-care ?  ? ?  ?AM-PAC OT "6 Clicks" Daily Activity     ?Outcome Measure Help from another person eating meals?: A Little ?Help from another person taking care of personal grooming?: A Little ?Help from another person toileting, which includes using toliet, bedpan, or urinal?: Total ?Help from another person bathing (including washing, rinsing, drying)?: A Lot ?Help from another person to put on and taking off regular upper body clothing?: A Little ?Help from another person to put on and taking off regular lower body clothing?: A Lot ?6 Click Score: 14 ?  ?End of Session Equipment Utilized During Treatment: Gait belt;Rolling walker (2 wheels) ?Nurse Communication: Other (comment) (need for pure wic) ? ?  Activity Tolerance: Patient tolerated treatment well ?Patient left: in bed;with call bell/phone within reach;with bed alarm set ? ?OT Visit Diagnosis: Unsteadiness on feet (R26.81);Muscle weakness (generalized) (M62.81);History of falling (Z91.81)  ?              ?Time: 1975-8832 ?OT Time Calculation (min): 33 min ?Charges:  OT  General Charges ?$OT Visit: 1 Visit ?OT Evaluation ?$OT Eval Moderate Complexity: 1 Mod ? ?Sammie Schermerhorn OTR/L, MS ?Acute Rehabilitation Department ?Office# [phone] ?Pager# [phone] ? ? ?[place] ?3/19/202

## 2021-05-15 NOTE — Progress Notes (Signed)
?PROGRESS NOTE ? ? ? ?Andrea [name]  TMA:[id number] DOB: 05-25-1955 DOA: 05/14/2021 ?PCP: [name], MD  ? ? ? ?Brief Narrative:  ? ? ?Andrea [name] is a 66 y.o. female with history of hypertension, hyperlipidemia, hypothyroidism had a fall while trying to swim, found to have right distal femur fracture ? ?Subjective: ? ?POD#1, has urinary retention, no other acute complaints  ? ? ?Assessment & Plan: ? Principal Problem: ?  Femur fracture, right ([place]) ?Active Problems: ?  Hypothyroidism ?  Hyperlipidemia ?  HYPERTENSION, BENIGN SYSTEMIC ?  Restrictive lung disease ?  Femur fracture ([place]) ? ? ? ?Assessment and Plan: ? ?Comminuted, angled, displaced right distal femur fracture ?-s/p IMN right distal femur on 3/18 ?-Postop pain management, wound care, weight bearing status, DVT prophylaxis per Ortho ?-Appreciate Ortho input ? ?Acute urinary retention ?Insert foley on 3/19 ?Need voiding trial prior to discharge  ? ? ?Hypertension on Coreg , hold amlodipine and spironolactone.  Follow blood pressure trends. ?Hyperlipidemia on statins. ?History of restrictive lung disease takes inhalers. No wheezing, no hypoxia ?Hypothyroidism on Synthroid. ?Chronic kidney disease stage II creatinine appears to be at baseline ? ?Class III obesity : Body mass index is 45.66 kg/m?[name]. ?   ? ? ? ?I have Reviewed nursing notes, Vitals, pain scores, I/o's, Lab results and  imaging results since pt's last encounter, details please see discussion above  ?I ordered the following labs:  ?Unresulted Labs (From admission, onward)  ? ?  Start     Ordered  ? 05/16/21 0500  CBC  Tomorrow morning,   R       ? 05/15/21 2309  ? 05/16/21 2563  Basic metabolic panel  Tomorrow morning,   R       ? 05/15/21 2309  ? ?  ?  ? ?  ? ? ? ?DVT prophylaxis: enoxaparin (LOVENOX) injection 40 mg Start: 05/15/21 0800 ?SCDs Start: 05/14/21 2335 ? ? ?Code Status:   Code Status: Full Code ? ?Family Communication: patient ?Disposition:  ? ?Status is:  Inpatient ? ?Dispo: The patient is from: home ?             Anticipated d/c is to: SNF ?             Anticipated d/c date is: medically stable to discharge, awaiting for bed ? ?Antimicrobials:   ? ?Anti-infectives (From admission, onward)  ? ? Start     Dose/Rate Route Frequency Ordered Stop  ? 05/14/21 1833  ceFAZolin (ANCEF) 3-0.9 GM/100ML-% IVPB       ?Note to Pharmacy: Herbie Drape J: cabinet override  ?    05/14/21 1833 05/15/21 0644  ? ?  ? ? ? ? ? ?Objective: ?Vitals:  ? 05/15/21 0953 05/15/21 1430 05/15/21 1932 05/15/21 2105  ?BP: 121/83 114/75  94/60  ?Pulse: 78 74  86  ?Resp: '16 17  17  '$ ?Temp: 98.2 ?F (36.8 ?C) 98.2 ?F (36.8 ?C)  98.6 ?F (37 ?C)  ?TempSrc:      ?SpO2: 100% 96% 94% 92%  ?Weight:      ?Height:      ? ? ?Intake/Output Summary (Last 24 hours) at 05/15/2021 2311 ?Last data filed at 05/15/2021 2200 ?Gross per 24 hour  ?Intake 600 ml  ?Output 800 ml  ?Net -200 ml  ? ?Filed Weights  ? 05/14/21 1400  ?Weight: 120.7 kg  ? ? ?Examination: ? ?General exam: alert, awake, communicative,calm, NAD ?Respiratory system: Clear to auscultation. Respiratory effort normal. ?Cardiovascular  system:  RRR.  ?Gastrointestinal system: Abdomen is nondistended, soft and nontender.  Normal bowel sounds heard. ?Central nervous system: Alert and oriented. No focal neurological deficits. ?Extremities:  right lower extremity post op changes , + dressing/ACE wrap ?Skin: No rashes, lesions or ulcers ?Psychiatry: Judgement and insight appear normal. Mood & affect appropriate.  ? ? ? ?Data Reviewed: I have personally reviewed  labs and visualized  imaging studies since the last encounter and formulate the plan  ? ? ? ? ? ? ?Scheduled Meds: ? atorvastatin  20 mg Oral q1800  ? carvedilol  6.25 mg Oral BID WC  ? enoxaparin (LOVENOX) injection  40 mg Subcutaneous Q24H  ? famotidine  20 mg Oral Daily  ? levothyroxine  125 mcg Oral QAC breakfast  ? methocarbamol  500-1,000 mg Oral Q6H  ? mometasone-formoterol  2 puff Inhalation BID   ? omega-3 acid ethyl esters  1 g Oral BID  ? scopolamine  1 patch Transdermal Once  ? sertraline  100 mg Oral Daily  ? ?Continuous Infusions: ? ? LOS: 1 day  ? ? ? ? ?Florencia Reasons, MD PhD FACP ?[facility] ? ?Available via Epic secure chat 7am-7pm for nonurgent issues ?Please page for urgent issues ?To page the attending provider between 7A-7P or the covering provider during after hours 7P-7A, please log into the web site [email or web address] and access using universal [place] password for that web site. If you do not have the password, please call the hospital operator. ? ? ? ?05/15/2021, 11:11 PM  ? ? ?

## 2021-05-15 NOTE — Evaluation (Signed)
Physical Therapy Evaluation ?Patient Details ?Name: Andrea [name] ?MRN: [id number] ?DOB: 1956-01-13 ?Today's Date: 05/15/2021 ? ?History of Present Illness ? Pt admitted s/p swimming pool injury with R distal femur fx and now s/p IM fixation.  Pt with hx of htn and restrictive lung disease  ?Clinical Impression ? Pt admitted as above and presents with functional mobility limitations 2* decreased R LE strength/ROM, post op pain, TDWB on R LE and obesity.  Pt is currently requiring significant assist of 2 for performance of limited mobility tasks and would benefit from follow up rehab at SNF level to maximize IND and safety prior to return home with limited assist. ?   ? ?Recommendations for follow up therapy are one component of a multi-disciplinary discharge planning process, led by the attending physician.  Recommendations may be updated based on patient status, additional functional criteria and insurance authorization. ? ?Follow Up Recommendations Skilled nursing-short term rehab (<3 hours/day) ? ?  ?Assistance Recommended at Discharge Frequent or constant Supervision/Assistance  ?Patient can return home with the following ? Two people to help with walking and/or transfers;A lot of help with bathing/dressing/bathroom;Assistance with cooking/housework;Assist for transportation;Help with stairs or ramp for entrance ? ?  ?Equipment Recommendations Rolling walker (2 wheels);Wheelchair cushion (measurements PT);BSC/3in1 (wide equipment 2* body habitus)  ?Recommendations for Other Services ?    ?  ?Functional Status Assessment Patient has had a recent decline in their functional status and demonstrates the ability to make significant improvements in function in a reasonable and predictable amount of time.  ? ?  ?Precautions / Restrictions Precautions ?Precautions: Fall ?Restrictions ?Weight Bearing Restrictions: Yes ?RLE Weight Bearing: Touchdown weight bearing  ? ?  ? ?Mobility ? Bed Mobility ?Overal bed mobility:  Needs Assistance ?Bed Mobility: Supine to Sit, Sit to Supine ?  ?  ?Supine to sit: Mod assist, Max assist, +2 for physical assistance, +2 for safety/equipment ?Sit to supine: Mod assist, Max assist, +2 for physical assistance, +2 for safety/equipment ?  ?General bed mobility comments: Increased time, use of bedrails, physical assist to manage L LE, control trunk and rotate to/from EOB with use of bed pad ?  ? ?Transfers ?Overall transfer level: Needs assistance ?Equipment used: Rolling walker (2 wheels) ?Transfers: Sit to/from Stand ?Sit to Stand: Mod assist, Max assist, From elevated surface ?  ?  ?  ?  ?  ?General transfer comment: Extensive use of bed to lift pt, cues for LE management, TDWB on R LE, and use of UEs to self assist, physical assist to bring wt up and fwd and to balance in standing with RW ?  ? ?Ambulation/Gait ?  ?  ?  ?  ?  ?  ?  ?General Gait Details: Pt stood with RW and assist of 2 but unable to take step and comply with TDWB; Pt stood ~ 1 min to allow bed change and reposition and pt returned to sitting ? ?Stairs ?  ?  ?  ?  ?  ? ?Wheelchair Mobility ?  ? ?Modified Rankin (Stroke Patients Only) ?  ? ?  ? ?Balance Overall balance assessment: Needs assistance ?Sitting-balance support: Feet supported, No upper extremity supported ?Sitting balance-Leahy Scale: Good ?  ?  ?Standing balance support: Bilateral upper extremity supported ?Standing balance-Leahy Scale: Poor ?  ?  ?  ?  ?  ?  ?  ?  ?  ?  ?  ?  ?   ? ? ? ?Pertinent Vitals/Pain Pain Assessment ?Pain Assessment: Faces ?Faces  Pain Scale: Hurts whole lot ?Pain Location: R knee ?Pain Descriptors / Indicators: Aching, Grimacing, Guarding, Sore ?Pain Intervention(s): Limited activity within patient's tolerance, Monitored during session, Premedicated before session (pt declines)  ? ? ?Home Living Family/patient expects to be discharged to:: Private residence ?Living Arrangements: Other relatives (grandson lives with pt but works days) ?Available  Help at Discharge: Available PRN/intermittently;Family ?Type of Home: Apartment ?Home Access: Stairs to enter ?  ?Entrance Stairs-Number of Steps: 3 ?  ?Home Layout: One level ?Home Equipment: None ?   ?  ?Prior Function Prior Level of Function : Independent/Modified Independent ?  ?  ?  ?  ?  ?  ?  ?  ?  ? ? ?Hand Dominance  ?   ? ?  ?Extremity/Trunk Assessment  ? Upper Extremity Assessment ?Upper Extremity Assessment: Overall WFL for tasks assessed ?  ? ?Lower Extremity Assessment ?Lower Extremity Assessment: RLE deficits/detail ?RLE: Unable to fully assess due to pain ?  ? ?Cervical / Trunk Assessment ?Cervical / Trunk Assessment: Normal  ?Communication  ? Communication: No difficulties  ?Cognition Arousal/Alertness: Awake/alert ?Behavior During Therapy: [facility] for tasks assessed/performed ?Overall Cognitive Status: Within Functional Limits for tasks assessed ?  ?  ?  ?  ?  ?  ?  ?  ?  ?  ?  ?  ?  ?  ?  ?  ?  ?  ?  ? ?  ?General Comments   ? ?  ?Exercises General Exercises - Lower Extremity ?Ankle Circles/Pumps: AROM, Both, 15 reps, Supine  ? ?Assessment/Plan  ?  ?PT Assessment Patient needs continued PT services  ?PT Problem List Decreased strength;Decreased range of motion;Decreased activity tolerance;Decreased balance;Decreased mobility;Decreased knowledge of use of DME;Obesity;Pain ? ?   ?  ?PT Treatment Interventions DME instruction;Gait training;Stair training;Functional mobility training;Therapeutic activities;Therapeutic exercise;Patient/family education   ? ?PT Goals (Current goals can be found in the Care Plan section)  ?Acute Rehab PT Goals ?Patient Stated Goal: Regain IND ?PT Goal Formulation: With patient ?Time For Goal Achievement: 05/29/21 ?Potential to Achieve Goals: Fair ? ?  ?Frequency Min 5X/week ?  ? ? ?Co-evaluation PT/OT/SLP Co-Evaluation/Treatment: Yes ?Reason for Co-Treatment: For patient/therapist safety;To address functional/ADL transfers ?PT goals addressed during session: Mobility/safety  with mobility ?OT goals addressed during session: ADL's and self-care ?  ? ? ?  ?AM-PAC PT "6 Clicks" Mobility  ?Outcome Measure Help needed turning from your back to your side while in a flat bed without using bedrails?: A Lot ?Help needed moving from lying on your back to sitting on the side of a flat bed without using bedrails?: A Lot ?Help needed moving to and from a bed to a chair (including a wheelchair)?: A Lot ?Help needed standing up from a chair using your arms (e.g., wheelchair or bedside chair)?: A Lot ?Help needed to walk in hospital room?: Total ?Help needed climbing 3-5 steps with a railing? : Total ?6 Click Score: 10 ? ?  ?End of Session Equipment Utilized During Treatment: Gait belt ?Activity Tolerance: Patient limited by fatigue;Patient limited by pain ?Patient left: in bed;with call bell/phone within reach;with bed alarm set ?Nurse Communication: Mobility status ?PT Visit Diagnosis: Difficulty in walking, not elsewhere classified (R26.2);Pain ?Pain - Right/Left: Right ?Pain - part of body: Knee ?  ? ?Time: 0102-7253 ?PT Time Calculation (min) (ACUTE ONLY): 41 min ? ? ?Charges:   PT Evaluation ?$PT Eval Low Complexity: 1 Low ?PT Treatments ?$Therapeutic Activity: 8-22 mins ?  ?   ? ? ?Susen Haskew  Wendi Snipes PT ?Acute Rehabilitation Services ?Pager [phone] ?Office [phone] ? ? ?Laurita Peron ?05/15/2021, 12:53 PM ? ?

## 2021-05-15 NOTE — Progress Notes (Addendum)
Subjective: ?1 Day Post-Op Procedure(s) (LRB): ?INTRAMEDULLARY (IM) RETROGRADE FEMORAL NAILING (Right) ?Patient reports pain as mild.  Patient reports she feels much better than preop.  Taking by mouth and voiding okay.  No dizziness, chest pain, or shortness of breath.  Slow progress with first visit of PT. ? ?Objective: ?Vital signs in last 24 hours: ?Temp:  [97.5 ?F (36.4 ?C)-98.2 ?F (36.8 ?C)] 98.2 ?F (36.8 ?C) (03/19 1884) ?Pulse Rate:  [58-78] 78 (03/19 0953) ?Resp:  [11-19] 16 (03/19 0953) ?BP: (105-134)/(73-103) 121/83 (03/19 1660) ?SpO2:  [98 %-100 %] 100 % (03/19 0953) ?FiO2 (%):  [28 %] 28 % (03/19 0755) ?Weight:  [120.7 kg] 120.7 kg (03/18 1400) ? ?Intake/Output from previous day: ?03/18 0701 - 03/19 0700 ?In: 2570 [P.O.:570; I.V.:1900] ?Out: 50 [Blood:50] ?Intake/Output this shift: ?No intake/output data recorded. ? ?Recent Labs  ?  05/14/21 ?1508 05/15/21 ?0330  ?HGB 12.7 11.2*  ? ?Recent Labs  ?  05/14/21 ?1508 05/15/21 ?0330  ?WBC 8.2 10.7*  ?RBC 4.37 3.79*  ?HCT 39.6 34.4*  ?PLT 202 191  ? ?Recent Labs  ?  05/14/21 ?1508 05/15/21 ?0330  ?NA 135 138  ?K 4.6 5.0  ?CL 102 105  ?CO2 27 24  ?BUN 33* 36*  ?CREATININE 1.08* 1.08*  ?GLUCOSE 112* 147*  ?CALCIUM 9.0 8.7*  ? ?No results for input(s): LABPT, INR in the last 72 hours. ?Right lower extremity exam: ?Neurovascular intact ?Intact pulses distally ?Dorsiflexion/Plantar flexion intact ?Compartment soft ?Ace wrap intact to the right lower extremity.  Dressings are clean and dry. ?Patient is alert and oriented and in no acute distress. ? ? ?Assessment/Plan: ?1 Day Post-Op Procedure(s) (LRB): ?INTRAMEDULLARY (IM) RETROGRADE FEMORAL NAILING (Right) ?Obesity. ?Plan: ?Up with therapy touchdown weightbearing on right lower extremity. ?Lovenox 40 mg subcu daily for DVT prophylaxis.  ?Check CBC in a.m. ?Most likely will need skilled nursing facility.  We will make sure that social services consult has been done (it has been done). ? ? ?Andrea [name] ?05/15/2021, 1:55 PM ? ?

## 2021-05-15 NOTE — TOC Initial Note (Signed)
Transition of Care (TOC) - Initial/Assessment Note  ? ? ?Patient Details  ?Name: Andrea [name] ?MRN: [id number] ?Date of Birth: February 24, 1956 ? ?Transition of Care (TOC) CM/SW Contact:    ?Tawanna Cooler, RN ?Phone Number: ?05/15/2021, 10:28 AM ? ?Clinical Narrative:                 ? ?Patient from home, lives with children.  Pending PT eval for recommendations.  TOC CM following for discharge needs.  ? ?Expected Discharge Plan: [place] ?Barriers to Discharge: Continued Medical Work up ? ? ?Expected Discharge Plan and Services ?Expected Discharge Plan: [place] ?  ? Prior Living Arrangements/Services ?  ?Lives with:: Adult Children ?Patient language and need for interpreter reviewed:: Yes ?       ?Need for Family Participation in Patient Care: Yes (Comment) ?Care giver support system in place?: Yes (comment) ?  ?Criminal Activity/Legal Involvement Pertinent to Current Situation/Hospitalization: No - Comment as needed ? ?Activities of Daily Living ?Home Assistive Devices/Equipment: None ?ADL Screening (condition at time of admission) ?Patient's cognitive ability adequate to safely complete daily activities?: Yes ?Is the patient deaf or have difficulty hearing?: No ?Does the patient have difficulty seeing, even when wearing glasses/contacts?: No ?Does the patient have difficulty concentrating, remembering, or making decisions?: No ?Patient able to express need for assistance with ADLs?: Yes ?Does the patient have difficulty dressing or bathing?: No ?Independently performs ADLs?: Yes (appropriate for developmental age) ?Does the patient have difficulty walking or climbing stairs?: No ?Weakness of Legs: Right ?Weakness of Arms/Hands: None ? ?Admission diagnosis:  Closed fracture of right femur, unspecified fracture morphology, unspecified portion of femur, initial encounter ([place]) [S72.91XA] ?Femur fracture, right ([place]) [S72.91XA] ?Femur fracture ([place]) [S72.90XA] ?Patient Active  Problem List  ? Diagnosis Date Noted  ? Femur fracture, right ([place]) 05/14/2021  ? Femur fracture ([place]) 05/14/2021  ? Seasonal allergies 04/08/2021  ? Restrictive lung disease 03/25/2021  ? Renal impairment 03/25/2021  ? Prediabetes 03/25/2021  ? Thoracic aortic ectasia ([place]) 09/07/2020  ? Congestive heart failure ([place]) 08/03/2020  ? Esophageal reflux 08/14/2017  ? Vitamin D deficiency 01/12/2017  ? Cataract 09/19/2013  ? Hypothyroidism 04/26/2006  ? Hyperlipidemia 04/26/2006  ? Morbid obesity ([place]) 04/26/2006  ? Major depressive disorder, recurrent episode ([place]) 04/26/2006  ? HYPERTENSION, BENIGN SYSTEMIC 04/26/2006  ? ?PCP:  [name], MD ?Pharmacy:   ?[facility] [place] [facility] [place] ?[place] ?[place] ([place]) [place] [address] ?Phone: [phone] Fax: [phone] ? ? ?Readmission Risk Interventions ?No flowsheet data found. ? ? ?

## 2021-05-15 NOTE — Progress Notes (Signed)
Orthopedic Tech Progress Note ?Patient Details:  ?Andrea [name] ?01/07/1956 ?[id number] ? ?Ortho Devices ?Type of Ortho Device: Crutches ?Ortho Device/Splint Interventions: Adjustment ?  ?Post Interventions ?Patient Tolerated: Well ?Instructions Provided: Care of device ? ?Andrea [name] ?05/15/2021, 1:03 PM ? ?

## 2021-05-16 ENCOUNTER — Ambulatory Visit: Payer: Self-pay

## 2021-05-16 ENCOUNTER — Encounter (HOSPITAL_COMMUNITY): Payer: Self-pay | Admitting: Orthopedic Surgery

## 2021-05-16 DIAGNOSIS — S7291XD Unspecified fracture of right femur, subsequent encounter for closed fracture with routine healing: Secondary | ICD-10-CM

## 2021-05-16 DIAGNOSIS — D62 Acute posthemorrhagic anemia: Secondary | ICD-10-CM | POA: Diagnosis not present

## 2021-05-16 DIAGNOSIS — R338 Other retention of urine: Secondary | ICD-10-CM | POA: Diagnosis not present

## 2021-05-16 LAB — CBC
HCT: 27 % — ABNORMAL LOW (ref 36.0–46.0)
HCT: 26.8 % — ABNORMAL LOW (ref 36.0–46.0)
Hemoglobin: 8.7 g/dL — ABNORMAL LOW (ref 12.0–15.0)
Hemoglobin: 8.7 g/dL — ABNORMAL LOW (ref 12.0–15.0)
MCH: 29.2 pg (ref 26.0–34.0)
MCH: 29.6 pg (ref 26.0–34.0)
MCHC: 32.2 g/dL (ref 30.0–36.0)
MCHC: 32.5 g/dL (ref 30.0–36.0)
MCV: 90.6 fL (ref 80.0–100.0)
MCV: 91.2 fL (ref 80.0–100.0)
Platelets: 167 10*3/uL (ref 150–400)
Platelets: 165 10*3/uL (ref 150–400)
RBC: 2.98 MIL/uL — ABNORMAL LOW (ref 3.87–5.11)
RBC: 2.94 MIL/uL — ABNORMAL LOW (ref 3.87–5.11)
RDW: 15.1 % (ref 11.5–15.5)
RDW: 15.1 % (ref 11.5–15.5)
WBC: 8.6 10*3/uL (ref 4.0–10.5)
WBC: 8.6 10*3/uL (ref 4.0–10.5)
nRBC: 0 % (ref 0.0–0.2)
nRBC: 0 % (ref 0.0–0.2)

## 2021-05-16 LAB — BASIC METABOLIC PANEL
Anion gap: 7 (ref 5–15)
BUN: 42 mg/dL — ABNORMAL HIGH (ref 8–23)
CO2: 25 mmol/L (ref 22–32)
Calcium: 8.4 mg/dL — ABNORMAL LOW (ref 8.9–10.3)
Chloride: 100 mmol/L (ref 98–111)
Creatinine, Ser: 1.16 mg/dL — ABNORMAL HIGH (ref 0.44–1.00)
GFR, Estimated: 52 mL/min — ABNORMAL LOW (ref >60)
Glucose, Bld: 112 mg/dL — ABNORMAL HIGH (ref 70–99)
Potassium: 3.9 mmol/L (ref 3.5–5.1)
Sodium: 132 mmol/L — ABNORMAL LOW (ref 135–145)

## 2021-05-16 MED ORDER — ACETAMINOPHEN 325 MG PO TABS: 650.0000 mg | ORAL_TABLET | Freq: Four times a day (QID) | ORAL | Status: DC | PRN

## 2021-05-16 NOTE — TOC PASRR Note (Signed)
RE: Andrea [name] ?Date of Birth: [birth year] ?Date: 05/16/2021 ?MUST ID: [id number] ? ? ? ?To Whom It May Concern: ? ? ?Please be advised the above name patient will require a short-term nursing home stay--anticipated 30 days or less rehabilitation and strengthening. The plan is for return home. ?

## 2021-05-16 NOTE — Progress Notes (Signed)
?PROGRESS NOTE ? ? ? ?Andrea [name]  IRS:[id number] DOB: 10-27-1955 DOA: 05/14/2021 ?PCP: [name], MD  ? ? ? ?Brief Narrative:  ? ? ?Andrea [name] is a 66 y.o. female with history of hypertension, hyperlipidemia, hypothyroidism had a fall while trying to swim, found to have comminuted, angulated and displaced right distal femur fracture.  Orthopedics consulted and s/p closed reduction and retrograde IM fixation of right distal femur fracture.  Hospital course complicated by acute urinary retention and postop acute blood loss anemia.  Awaiting SNF bed and level 2 PASSR for STR. ? ?Subjective: ? ?Reports aches and pains generally but more so in right knee.  As per RN, Foley catheter was discontinued 3/19 and she has been voiding spontaneously.  Ate 100% of breakfast.  Denies any other complaints. ? ?Assessment & Plan: ? Principal Problem: ?  Femur fracture, right ([place]) ?Active Problems: ?  Hypothyroidism ?  Hyperlipidemia ?  HYPERTENSION, BENIGN SYSTEMIC ?  Restrictive lung disease ?  Femur fracture ([place]) ? ?Assessment and Plan: ? ?Comminuted, angled, displaced right distal femur fracture ?-s/p IMN right distal femur on 3/18 by Dr. Lynann Bologna. ?-Postop pain management, wound care, weight bearing status, DVT prophylaxis per Ortho ?-As per orthopedic follow-up on 3/19, touchdown weightbearing on right lower extremity, Lovenox for DVT prophylaxis and likely SNF for STR. ? ?Acute urinary retention ?Foley catheter removed on 3/19 and patient voiding spontaneously. ?Continue to monitor. ?Likely related to pain and pain meds ? ?Postop acute blood loss anemia ?Hemoglobin on 3/18: 12.7 which has drifted down to 8.7 on 3/20. ?Check hemoglobin now and CBC in AM. ?Transfuse for hemoglobin 7 g or less. ? ?Hypertension  ?on Coreg , hold amlodipine and spironolactone.  ?Controlled. ? ?Hyperlipidemia  ?on statins, continue ?[name] ?History of restrictive lung disease  ?takes inhalers.  No clinical bronchospasm and no  hypoxia ? ?Hypothyroidism  ?on Synthroid, continue. ? ?Chronic kidney disease stage II  ?Creatinine on 3/1 was 1.01.  This has slightly increased to 1.16. ?Avoid nephrotoxic's and follow BMP ? ?Class III obesity : Body mass index is 45.66 kg/m?[name]. ?   ? ? ?DVT prophylaxis: enoxaparin (LOVENOX) injection 40 mg Start: 05/15/21 0800 ?SCDs Start: 05/14/21 2335 ?  Code Status: Full Code ?Family Communication: None at bedside. ?Disposition:  ? ?Status is: Inpatient ? ?Dispo: The patient is from: home ?             Anticipated d/c is to: SNF ?             Anticipated d/c date is: medically stable to discharge, awaiting for bed and level 2 PASSR. ? ?Antimicrobials:   ?Intra-Op antibiotics but otherwise none. ? ?Consultants ?Orthopedics ? ?Procedures ?As noted above ? ? ?Objective: ?Vitals:  ? 05/16/21 0535 05/16/21 0746 05/16/21 0830 05/16/21 1313  ?BP: 110/63  126/75 (!) 118/56  ?Pulse: 74  70 78  ?Resp: '17  17 17  '$ ?Temp: 98.7 ?F (37.1 ?C)  97.8 ?F (36.6 ?C) 98.1 ?F (36.7 ?C)  ?TempSrc:   Oral Oral  ?SpO2: 100% 93% 93% 100%  ?Weight:      ?Height:      ? ? ?Intake/Output Summary (Last 24 hours) at 05/16/2021 1729 ?Last data filed at 05/16/2021 1545 ?Gross per 24 hour  ?Intake 960 ml  ?Output 1300 ml  ?Net -340 ml  ? ?Filed Weights  ? 05/14/21 1400  ?Weight: 120.7 kg  ? ? ?Examination: ? ?General exam: Middle-age female, moderately built and morbidly obese lying propped  up in bed without distress.  Oral mucosa moist. ?Respiratory system: Clear to auscultation. Respiratory effort normal. ?Cardiovascular system: S1 and S2 heard, RRR.  No JVD, murmurs or pedal edema. ?Gastrointestinal system: Abdomen is nondistended, soft and nontender.  Normal bowel sounds heard. ?Central nervous system: Alert and oriented. No focal neurological deficits. ?Extremities: Right lower thigh/knee area postop Ace wrap dressing clean and dry.  Has ice pack on top. ?Skin: No rashes, lesions or ulcers ?Psychiatry: Judgement and insight appear normal.  Mood & affect appropriate.  ? ? ? ?Data Reviewed: I have personally reviewed  labs and visualized  imaging studies since the last encounter and formulate the plan  ? ?Lab work from 3/20 significant for sodium 132, BUN 46, creatinine 1.16 up from 1.08 the day prior and hemoglobin of 8.7. ? ? ? ? ?Scheduled Meds: ? atorvastatin  20 mg Oral q1800  ? carvedilol  6.25 mg Oral BID WC  ? enoxaparin (LOVENOX) injection  40 mg Subcutaneous Q24H  ? famotidine  20 mg Oral Daily  ? levothyroxine  125 mcg Oral QAC breakfast  ? methocarbamol  500-1,000 mg Oral Q6H  ? mometasone-formoterol  2 puff Inhalation BID  ? omega-3 acid ethyl esters  1 g Oral BID  ? scopolamine  1 patch Transdermal Once  ? sertraline  100 mg Oral Daily  ? ?Continuous Infusions: ? ? LOS: 2 days  ? ? ?Vernell Leep, MD,  ?FACP, [facility], [facility], [facility] (Care Management Physician Certified) ?[facility]ician Advisor ?[place] ? ?To contact the attending provider between 7A-7P or the covering provider during after hours 7P-7A, please log into the web site [email or web address] and access using universal [place] password for that web site. If you do not have the password, please call the hospital operator. ? ? ? ?05/16/2021, 5:29 PM  ? ? ?

## 2021-05-16 NOTE — NC FL2 (Signed)
?[place] MEDICAID FL2 LEVEL OF CARE SCREENING TOOL  ?  ? ?IDENTIFICATION  ?Patient Name: ?Andrea [name] Birthdate: 11/25/1955 Sex: female Admission Date (Current Location): ?05/14/2021  ?[place] and [place] Number: ? [place] ?  Facility and Address:  ?[facility],  [place], [place] ?     Provider Number: ?[id number]  ?Attending Physician Name and Address:  ?Modena Jansky, MD ? Relative Name and Phone Number:  ?Emelda Fear (sister) Ph: [phone] ?   ?Current Level of Care: ?Hospital Recommended Level of Care: ?[place] Prior Approval Number: ?  ? ?Date Approved/Denied: ?  PASRR Number: ?Pending ? ?Discharge Plan: ?SNF ?  ? ?Current Diagnoses: ?Patient Active Problem List  ? Diagnosis Date Noted  ? Femur fracture, right ([place]) 05/14/2021  ? Femur fracture ([place]) 05/14/2021  ? Seasonal allergies 04/08/2021  ? Restrictive lung disease 03/25/2021  ? Renal impairment 03/25/2021  ? Prediabetes 03/25/2021  ? Thoracic aortic ectasia ([place]) 09/07/2020  ? Congestive heart failure ([place]) 08/03/2020  ? Esophageal reflux 08/14/2017  ? Vitamin D deficiency 01/12/2017  ? Cataract 09/19/2013  ? Hypothyroidism 04/26/2006  ? Hyperlipidemia 04/26/2006  ? Morbid obesity ([place]) 04/26/2006  ? Major depressive disorder, recurrent episode ([place]) 04/26/2006  ? HYPERTENSION, BENIGN SYSTEMIC 04/26/2006  ? ? ?Orientation RESPIRATION BLADDER Height & Weight   ?  ?Self, Time, Situation, Place ? Normal Incontinent Weight: 266 lb (120.7 kg) ?Height:  '5\' 4"'$  (162.6 cm)  ?BEHAVIORAL SYMPTOMS/MOOD NEUROLOGICAL BOWEL NUTRITION STATUS  ? (N/A)  (N/A) Continent Diet (Heart healthy)  ?AMBULATORY STATUS COMMUNICATION OF NEEDS Skin   ?Extensive Assist Verbally Surgical wounds ?  ?  ?  ?    ?     ?     ? ? ?Personal Care Assistance Level of Assistance  ?Bathing, Feeding, Dressing Bathing Assistance: Maximum assistance ?Feeding assistance: Limited assistance ?Dressing Assistance: Maximum assistance ?    ? ?Functional Limitations Info  ?Sight, Hearing, Speech Sight Info: Impaired ?Hearing Info: Adequate ?Speech Info: Adequate  ? ? ?SPECIAL CARE FACTORS FREQUENCY  ?PT (By licensed PT), OT (By licensed OT)   ?  ?PT Frequency: 5x's/week ?OT Frequency: 5x's/week ?  ?  ?  ?   ? ? ?Contractures Contractures Info: Not present  ? ? ?Additional Factors Info  ?Code Status, Allergies, Psychotropic Code Status Info: Full ?Allergies Info: NKA ?Psychotropic Info: Zoloft ?  ?  ?   ? ?Current Medications (05/16/2021):  This is the current hospital active medication list ?Current Facility-Administered Medications  ?Medication Dose Route Frequency Provider Last Rate Last Admin  ? atorvastatin (LIPITOR) tablet 20 mg  20 mg Oral q1800 Adrian Saran, RPH   20 mg at 05/15/21 1844  ? carvedilol (COREG) tablet 6.25 mg  6.25 mg Oral BID WC Rise Patience, MD   6.25 mg at 05/16/21 8185  ? enoxaparin (LOVENOX) injection 40 mg  40 mg Subcutaneous Q24H Rise Patience, MD   40 mg at 05/16/21 6314  ? famotidine (PEPCID) tablet 20 mg  20 mg Oral Daily Rise Patience, MD   20 mg at 05/16/21 9702  ? HYDROcodone-acetaminophen (NORCO/VICODIN) 5-325 MG per tablet 1-2 tablet  1-2 tablet Oral Q4H PRN Rise Patience, MD   2 tablet at 05/16/21 1121  ? levothyroxine (SYNTHROID) tablet 125 mcg  125 mcg Oral QAC breakfast Rise Patience, MD   125 mcg at 05/16/21 0536  ? methocarbamol (ROBAXIN) tablet 500-1,000 mg  500-1,000 mg Oral Q6H Gean Birchwood  N, MD   500 mg at 05/16/21 1121  ? mometasone-formoterol (DULERA) 200-5 MCG/ACT inhaler 2 puff  2 puff Inhalation BID Rise Patience, MD   2 puff at 05/16/21 0746  ? omega-3 acid ethyl esters (LOVAZA) capsule 1 g  1 g Oral BID Rise Patience, MD   1 g at 05/16/21 8350  ? scopolamine (TRANSDERM-SCOP) 1 MG/3DAYS 1.5 mg  1 patch Transdermal Once Annye Asa, MD   1.5 mg at 05/14/21 1824  ? sertraline (ZOLOFT) tablet 100 mg  100 mg Oral Daily Rise Patience, MD   100 mg at 05/16/21 7573  ? ? ? ?Discharge Medications: ?Please see discharge summary for a list of discharge medications. ? ?Relevant Imaging Results: ? ?Relevant Lab Results: ? ? ?Additional Information ?SSN: [id number] ? ?Sherie Don, LCSW ? ? ? ? ?

## 2021-05-16 NOTE — TOC Progression Note (Addendum)
Transition of Care (TOC) - Progression Note  ? ?Patient Details  ?Name: Andrea [name] ?MRN: [id number] ?Date of Birth: [birth year] ? ?Transition of Care (TOC) CM/SW Contact  ?Sherie Don, LCSW ?Phone Number: ?05/16/2021, 12:53 PM ? ?Clinical Narrative: PT and OT evaluations recommended SNF. CSW spoke with patient and her sister, [name], to discuss recommendations. Patient and sister were initially interested in patient returning home with [facility], but patient's grandson that lives with her is usually there at night so patient would be alone most of the day. Patient is requiring significant assistance with ambulation and ADLs. Patient agreeable to being faxed out. ? ?FL2 done; PASRR pending as it will be a level 2 PASRR. Clinicals uploaded to [facility] MUST for review. Initial referral faxed out. TOC awaiting bed offers and PASRR number. ? ?Addendum: CSW received level 2 PASRR: [id number]. ? ?Expected Discharge Plan: [place] ?Barriers to Discharge: Continued Medical Work up ? ?Expected Discharge Plan and Services ?Expected Discharge Plan: [place] ? ?Readmission Risk Interventions ?No flowsheet data found. ? ?

## 2021-05-16 NOTE — Progress Notes (Addendum)
Physical Therapy Treatment ?Patient Details ?Name: Andrea [name] ?MRN: [id number] ?DOB: [birth year] ?Today's Date: 05/16/2021 ? ? ?History of Present Illness Pt admitted s/p swimming pool injury with R distal femur fx and now s/p IM fixation.  Pt with hx of htn and restrictive lung disease ? ?  ?PT Comments  ? ? Pt is progressing slowly. Able to perform sit<>stand with extensive assist of 2 people. Unable to maintain TDWBing  with attempt to wt shift/pivot d/t UE weakness and body habitus. Continue to strongly recommend SNF  ?Recommendations for follow up therapy are one component of a multi-disciplinary discharge planning process, led by the attending physician.  Recommendations may be updated based on patient status, additional functional criteria and insurance authorization. ? ?Follow Up Recommendations ? Skilled nursing-short term rehab (<3 hours/day) ?  ?  ?Assistance Recommended at Discharge Frequent or constant Supervision/Assistance  ?Patient can return home with the following Two people to help with walking and/or transfers;A lot of help with bathing/dressing/bathroom;Assistance with cooking/housework;Assist for transportation;Help with stairs or ramp for entrance ?  ?Equipment Recommendations ? Rolling walker (2 wheels);Wheelchair cushion (measurements PT);BSC/3in1  ?  ?Recommendations for Other Services   ? ? ?  ?Precautions / Restrictions Precautions ?Precautions: Fall ?Restrictions ?Weight Bearing Restrictions: Yes ?RLE Weight Bearing: Touchdown weight bearing  ?  ? ?Mobility ? Bed Mobility ?Overal bed mobility: Needs Assistance ?Bed Mobility: Supine to Sit, Sit to Supine ?  ?  ?Supine to sit: Mod assist, Max assist, +2 for physical assistance, +2 for safety/equipment ?Sit to supine: Mod assist, Max assist, +2 for physical assistance, +2 for safety/equipment ?  ?General bed mobility comments: Increased time, use of bedrails, physical assist to manage L LE, control trunk and rotate to/from EOB with use  of bed pad; pt is able to scoot up in supine with use rails, LLE and assist of 2 ?  ? ?Transfers ?Overall transfer level: Needs assistance ?Equipment used: Rolling walker (2 wheels) ?Transfers: Sit to/from Stand ?Sit to Stand: Max assist, Mod assist, +2 physical assistance, +2 safety/equipment, From elevated surface ?  ?  ?  ?  ?  ?General transfer comment: Extensive use of bed to lift pt, cues for LE management, TDWB on R LE, and use of UEs to self assist, physical assist to bring wt up and fwd and to balance in standing with RW; attempted wt shifting, pt is unable to maintain TDWB with stand pivot transfer attempt, environment manipulated to brign bed to pt ?  ? ?Ambulation/Gait ?  ?  ?  ?  ?  ?  ?  ?  ? ? ?Stairs ?  ?  ?  ?  ?  ? ? ?Wheelchair Mobility ?  ? ?Modified Rankin (Stroke Patients Only) ?  ? ? ?  ?Balance Overall balance assessment: Needs assistance ?Sitting-balance support: Feet supported, No upper extremity supported ?Sitting balance-Leahy Scale: Good ?  ?  ?Standing balance support: Bilateral upper extremity supported, Reliant on assistive device for balance ?Standing balance-Leahy Scale: Poor ?  ?  ?  ?  ?  ?  ?  ?  ?  ?  ?  ?  ?  ? ?  ?Cognition Arousal/Alertness: Awake/alert ?Behavior During Therapy: [facility] for tasks assessed/performed ?Overall Cognitive Status: Within Functional Limits for tasks assessed ?  ?  ?  ?  ?  ?  ?  ?  ?  ?  ?  ?  ?  ?  ?  ?  ?  ?  ?  ? ?  ?  Exercises  Bil ankle pumps x 10 ?Encouraged gentle heel slides with use of gait belt to self assist  ? ?  ?General Comments   ?  ?  ? ?Pertinent Vitals/Pain Pain Assessment ?Pain Assessment: 0-10 ?Pain Score: 4  ?Pain Location: R knee ?Pain Descriptors / Indicators: Aching, Grimacing, Guarding, Sore ?Pain Intervention(s): Limited activity within patient's tolerance, Monitored during session, Repositioned  ? ? ?Home Living   ?  ?  ?  ?  ?  ?  ?  ?  ?  ?   ?  ?Prior Function    ?  ?  ?   ? ?PT Goals (current goals can now be found in the  care plan section) Acute Rehab PT Goals ?Patient Stated Goal: Regain IND ?PT Goal Formulation: With patient ?Time For Goal Achievement: 05/29/21 ?Potential to Achieve Goals: Fair ?Progress towards PT goals: Progressing toward goals ? ?  ?Frequency ? ? ? Min 3X/week ? ? ? ?  ?PT Plan Current plan remains appropriate  ? ? ?Co-evaluation   ?  ?  ?  ?  ? ?  ?AM-PAC PT "6 Clicks" Mobility   ?Outcome Measure ? Help needed turning from your back to your side while in a flat bed without using bedrails?: Total ?Help needed moving from lying on your back to sitting on the side of a flat bed without using bedrails?: Total ?Help needed moving to and from a bed to a chair (including a wheelchair)?: Total ?Help needed standing up from a chair using your arms (e.g., wheelchair or bedside chair)?: Total ?Help needed to walk in hospital room?: Total ?Help needed climbing 3-5 steps with a railing? : Total ?6 Click Score: 6 ? ?  ?End of Session Equipment Utilized During Treatment: Gait belt ?Activity Tolerance: Patient limited by fatigue;Patient limited by pain ?Patient left: in bed;with call bell/phone within reach;with bed alarm set;with family/visitor present ?Nurse Communication: Mobility status ?PT Visit Diagnosis: Difficulty in walking, not elsewhere classified (R26.2);Pain ?Pain - Right/Left: Right ?Pain - part of body: Knee ?  ? ? ?Time: 0300-9233 ?PT Time Calculation (min) (ACUTE ONLY): 19 min ? ?Charges:  $Therapeutic Activity: 8-22 mins          ?          ? Baxter Flattery, PT ? ?Acute Rehab Dept [facility]) [phone] ?Pager [phone] ? ?05/16/2021 ? ? ? ?Abbott Jasinski ?05/16/2021, 11:40 AM ? ?

## 2021-05-16 NOTE — Chronic Care Management (AMB) (Signed)
? ?  RN Case Manager ?Care Management  ? Phone Outreach  ? ? ?05/16/2021 ?Name: [name] MRN: [id number] DOB: 04/20/1955 ? ?Andrea [name] is a 66 y.o. year old female who is a primary care patient of [name], MD .  ? ?CM RN received a call from the patient to cancel the appointment we had scheduled to talk on 05/24/21. She said she missed a step while at [facility] swimming and hurt her right leg. She is in the hospital and has a rod placed in her leg. [name] said she would be placed in rehab once she was ready to leave the hospital. ? ?Follow Up Plan:  CM RN  will monitor the patient's progress.    ? ?Review of patient status, including review of consultants reports, relevant laboratory and other test results, and collaboration with appropriate care team members and the patient's provider was performed as part of comprehensive patient evaluation and provision of care management services.   ? ?Lazaro Arms RN, BSN, [facility] ?Care Management Coordinator ?[place] ?Phone: [phone] Fax: [phone] ?  ? ? ? ? ? ? ? ? ? ? ? ?

## 2021-05-16 NOTE — Plan of Care (Signed)

## 2021-05-17 DIAGNOSIS — S7291XD Unspecified fracture of right femur, subsequent encounter for closed fracture with routine healing: Secondary | ICD-10-CM | POA: Diagnosis not present

## 2021-05-17 LAB — BASIC METABOLIC PANEL
Anion gap: 5 (ref 5–15)
BUN: 34 mg/dL — ABNORMAL HIGH (ref 8–23)
CO2: 26 mmol/L (ref 22–32)
Calcium: 8.4 mg/dL — ABNORMAL LOW (ref 8.9–10.3)
Chloride: 101 mmol/L (ref 98–111)
Creatinine, Ser: 0.96 mg/dL (ref 0.44–1.00)
GFR, Estimated: >60 mL/min (ref >60)
Glucose, Bld: 112 mg/dL — ABNORMAL HIGH (ref 70–99)
Potassium: 4.2 mmol/L (ref 3.5–5.1)
Sodium: 132 mmol/L — ABNORMAL LOW (ref 135–145)

## 2021-05-17 LAB — CBC
HCT: 26.5 % — ABNORMAL LOW (ref 36.0–46.0)
Hemoglobin: 8.5 g/dL — ABNORMAL LOW (ref 12.0–15.0)
MCH: 29.3 pg (ref 26.0–34.0)
MCHC: 32.1 g/dL (ref 30.0–36.0)
MCV: 91.4 fL (ref 80.0–100.0)
Platelets: 175 10*3/uL (ref 150–400)
RBC: 2.9 MIL/uL — ABNORMAL LOW (ref 3.87–5.11)
RDW: 15 % (ref 11.5–15.5)
WBC: 9.7 10*3/uL (ref 4.0–10.5)
nRBC: 0 % (ref 0.0–0.2)

## 2021-05-17 NOTE — Progress Notes (Signed)
?PROGRESS NOTE ? ? ? ?Andrea [name]  GGE:[id number] DOB: 1955/06/11 DOA: 05/14/2021 ?PCP: [name], MD  ? ? ? ?Brief Narrative:  ? ? ?Andrea [name] is a 66 y.o. female with history of hypertension, hyperlipidemia, hypothyroidism had a fall while trying to swim, found to have comminuted, angulated and displaced right distal femur fracture.  Orthopedics consulted and s/p closed reduction and retrograde IM fixation of right distal femur fracture.  Hospital course complicated by acute urinary retention and postop acute blood loss anemia.  Awaiting SNF bed and level 2 PASSR for STR. ? ?Subjective: ? ?Evaluated along with nurse tech at bedside.  Denies complaints.  States that she does not feel achy today.  Mild and appropriate right knee pain.  Reports having a BM on 3/18, normal color without melena or blood.  Denies any other bleeding.  No other complaints reported.  As per RN, no acute issues noted. ? ?Assessment & Plan: ? Principal Problem: ?  Femur fracture, right ([place]) ?Active Problems: ?  Hypothyroidism ?  Hyperlipidemia ?  HYPERTENSION, BENIGN SYSTEMIC ?  Restrictive lung disease ?  Femur fracture ([place]) ? ?Assessment and Plan: ? ?Comminuted, angled, displaced right distal femur fracture ?-s/p IMN right distal femur on 3/18 by Dr. Lynann Bologna. ?-Postop pain management, wound care, weight bearing status, DVT prophylaxis per Ortho ?-As per orthopedic follow-up on 3/19, touchdown weightbearing on right lower extremity, Lovenox for DVT prophylaxis and likely SNF for STR. ?-Is medically optimized for DC and awaiting SNF bed, level 2 PASSR and insurance authorization. ? ?Acute urinary retention ?Foley catheter removed on 3/19 and patient voiding spontaneously. ?Continue to monitor. ?Likely related to pain and pain meds ? ?Postop acute blood loss anemia ?Hemoglobin on 3/18: 12.7 which has drifted down to 8.7 on 3/20. ?Transfuse for hemoglobin 7 g or less. ?Monitor hemoglobin every 12 hours over the last  24 hours and has been stable, 8.7 > 8.7 > 8.5 ?Appears stable.  Follow CBC in a.m. ?Op report indicates EBL is minimal.  Sent a secure chat message with orthopedics to see if fracture site bleeding itself can cause this much hemoglobin drop.  Awaiting response ? ?Hypertension  ?on Coreg , hold amlodipine and spironolactone.  ?Controlled. ? ?Hyperlipidemia  ?on statins, continue ?[name] ?History of restrictive lung disease  ?takes inhalers.  No clinical bronchospasm and no hypoxia ? ?Hypothyroidism  ?on Synthroid, continue. ? ?Chronic kidney disease stage II  ?Creatinine on 3/1 was 1.01.  This had slightly increased to 1.16 but now has normalized/0.96. ?Avoid nephrotoxic's and follow BMP ? ?Class III obesity : Body mass index is 45.66 kg/m?[name]. ?   ? ? ?DVT prophylaxis: enoxaparin (LOVENOX) injection 40 mg Start: 05/15/21 0800 ?SCDs Start: 05/14/21 2335 ?  Code Status: Full Code ?Family Communication: None at bedside. ?Disposition:  ? ?Status is: Inpatient ? ?Dispo: The patient is from: home ?             Anticipated d/c is to: SNF ?             Anticipated d/c date is: medically stable to discharge, awaiting for bed, level 2 PASSR and insurance authorization. ? ?Antimicrobials:   ?Intra-Op antibiotics but otherwise none. ? ?Consultants ?Orthopedics ? ?Procedures ?As noted above ? ? ?Objective: ?Vitals:  ? 05/16/21 1929 05/16/21 2108 05/17/21 0540 05/17/21 4650  ?BP:  126/83 115/69   ?Pulse:  80 80   ?Resp:  16 16   ?Temp:  99.3 ?F (37.4 ?C) 98.8 ?F (37.1 ?  C)   ?TempSrc:  Oral Oral   ?SpO2: 91% 93% 96% 95%  ?Weight:      ?Height:      ? ? ?Intake/Output Summary (Last 24 hours) at 05/17/2021 1105 ?Last data filed at 05/17/2021 0900 ?Gross per 24 hour  ?Intake 480 ml  ?Output 1600 ml  ?Net -1120 ml  ? ?Filed Weights  ? 05/14/21 1400  ?Weight: 120.7 kg  ? ? ?Examination: ? ?General exam: Middle-age female, moderately built and morbidly obese lying propped up in bed without distress.  Oral mucosa moist.  Comfortable and in no  obvious distress. ?Respiratory system: Clear to auscultation.  No increased work of breathing. ?Cardiovascular system: S1 and S2 heard, RRR.  No JVD, murmurs or pedal edema. ?Gastrointestinal system: Abdomen is nondistended, soft and nontender.  Normal bowel sounds heard. ?Central nervous system: Alert and oriented. No focal neurological deficits. ?Extremities: Right lower thigh/knee area postop Ace wrap dressing clean and dry.  Able to move all limbs, right lower extremity with obvious limitations due to pain and recent surgery and dressing. ?Skin: No rashes, lesions or ulcers ?Psychiatry: Judgement and insight appear normal. Mood & affect appropriate.  ? ? ? ?Data Reviewed: I have personally reviewed  labs and visualized  imaging studies since the last encounter and formulate the plan  ? ?Lab work from 3/21 reviewed and significant for sodium 132, glucose 112, BUN 34, creatinine 0.96 and hemoglobin of 8.5. ? ? ? ? ?Scheduled Meds: ? atorvastatin  20 mg Oral q1800  ? carvedilol  6.25 mg Oral BID WC  ? enoxaparin (LOVENOX) injection  40 mg Subcutaneous Q24H  ? famotidine  20 mg Oral Daily  ? levothyroxine  125 mcg Oral QAC breakfast  ? methocarbamol  500-1,000 mg Oral Q6H  ? mometasone-formoterol  2 puff Inhalation BID  ? omega-3 acid ethyl esters  1 g Oral BID  ? scopolamine  1 patch Transdermal Once  ? sertraline  100 mg Oral Daily  ? ?Continuous Infusions: ? ? LOS: 3 days  ? ? ?Vernell Leep, MD,  ?FACP, [facility], [facility], [facility] (Care Management Physician Certified) ?[facility] & Physician Advisor ?[place] ? ?To contact the attending provider between 7A-7P or the covering provider during after hours 7P-7A, please log into the web site [email or web address] and access using universal [place] password for that web site. If you do not have the password, please call the hospital operator. ? ? ? ?05/17/2021, 11:05 AM  ? ? ?

## 2021-05-17 NOTE — Plan of Care (Signed)
Plan of care reviewed and discussed with the patient. 

## 2021-05-17 NOTE — Plan of Care (Signed)
  Problem: Activity: Goal: Risk for activity intolerance will decrease Outcome: Progressing   Problem: Pain Managment: Goal: General experience of comfort will improve Outcome: Progressing   Problem: Safety: Goal: Ability to remain free from injury will improve Outcome: Progressing   

## 2021-05-17 NOTE — TOC Progression Note (Signed)
Transition of Care (TOC) - Progression Note  ? ?Patient Details  ?Name: [name] ?MRN: [id number] ?Date of Birth: Nov 17, 1955 ? ?Transition of Care (TOC) CM/SW Contact  ?Sherie Don, LCSW ?Phone Number: ?05/17/2021, 2:17 PM ? ?Clinical Narrative: CSW provided patient and her sister, [name], with bed offers and explained the SNF referral was declined by most facilities as these are out of network with her [employer]. Patient and sister chose [facility]. CSW spoke with [place] in admissions at [facility] regarding accepting the bed offer. Facility to start insurance authorization. TOC awaiting insurance authorization. ? ?Expected Discharge Plan: [place] ?Barriers to Discharge: Insurance Authorization ? ?Expected Discharge Plan and Services ?Expected Discharge Plan: [place] ? ?Readmission Risk Interventions ?No flowsheet data found. ? ?

## 2021-05-18 DIAGNOSIS — S7291XD Unspecified fracture of right femur, subsequent encounter for closed fracture with routine healing: Secondary | ICD-10-CM | POA: Diagnosis not present

## 2021-05-18 LAB — CBC
HCT: 24.8 % — ABNORMAL LOW (ref 36.0–46.0)
Hemoglobin: 8 g/dL — ABNORMAL LOW (ref 12.0–15.0)
MCH: 29.6 pg (ref 26.0–34.0)
MCHC: 32.3 g/dL (ref 30.0–36.0)
MCV: 91.9 fL (ref 80.0–100.0)
Platelets: 176 10*3/uL (ref 150–400)
RBC: 2.7 MIL/uL — ABNORMAL LOW (ref 3.87–5.11)
RDW: 15.1 % (ref 11.5–15.5)
WBC: 9.5 10*3/uL (ref 4.0–10.5)
nRBC: 0.3 % — ABNORMAL HIGH (ref 0.0–0.2)

## 2021-05-18 MED ORDER — SENNOSIDES-DOCUSATE SODIUM 8.6-50 MG PO TABS
1.0000 | ORAL_TABLET | Freq: Two times a day (BID) | ORAL | Status: DC
  Administered 2021-05-18 – 2021-05-21 (×7): 1 via ORAL
  Filled 2021-05-18 (×7): qty 1

## 2021-05-18 MED ORDER — FERROUS SULFATE 325 (65 FE) MG PO TABS
325.0000 mg | ORAL_TABLET | Freq: Every day | ORAL | Status: DC
  Administered 2021-05-18 – 2021-05-19 (×2): 325 mg via ORAL
  Filled 2021-05-18 (×2): qty 1

## 2021-05-18 MED ORDER — POLYETHYLENE GLYCOL 3350 17 G PO PACK
17.0000 g | PACK | Freq: Two times a day (BID) | ORAL | Status: DC
  Administered 2021-05-18 – 2021-05-21 (×7): 17 g via ORAL
  Filled 2021-05-18 (×7): qty 1

## 2021-05-18 NOTE — Progress Notes (Signed)
? ? ?  Patient doing well S/P R femur fx with retrograde nail 05/14/2021. TDWB only, getting PT/OT awaiting SNF. Reports minimal pain. Eating and drinking NL, NL B/B habits. Denies fatigue/SOB.  ? ? ?Physical Exam: ?Vitals:  ? 05/18/21 0559 05/18/21 0739  ?BP: 137/90   ?Pulse: 79   ?Resp: 16   ?Temp: 98.7 ?F (37.1 ?C)   ?SpO2: 93% 95%  ? ? ?Dressing in place, CDI. Pt resting comfortably in bed. Distal compartment soft and 2+ DPP = BIL. Non-tender to palpation about thigh or knee.  ?NVI ? ?POD #4 s/p R femur fx with retrograde nail placement. Minimal PO pain. ? ?- up with PT/OT, encourage activity, TDWB only X6weks PO ?- Pain minimal and well controlled ?- Awaiting bed at SNF ?- Medicine is primary and following PO anemia and medical path ?- likely d/c to SNF when bed available f/u in office 2 weeks PO  ?- Can remove bulky knee dressing and leave steri strips at 5 days PO and re-wrap with ACE.  ?

## 2021-05-18 NOTE — Plan of Care (Signed)
?  Problem: Health Behavior/Discharge Planning: ?Goal: Ability to manage health-related needs will improve ?Outcome: Progressing ?  ?Problem: Pain Managment: ?Goal: General experience of comfort will improve ?Outcome: Progressing ?  ?Problem: Safety: ?Goal: Ability to remain free from injury will improve ?Outcome: Progressing ?  ?Problem: Activity: ?Goal: Ability to ambulate and perform ADLs will improve ?Outcome: Progressing ?  ?

## 2021-05-18 NOTE — TOC Progression Note (Signed)
Transition of Care (TOC) - Progression Note  ? ?Patient Details  ?Name: Andrea [name] ?MRN: [id number] ?Date of Birth: [birth year] ? ?Transition of Care (TOC) CM/SW Contact  ?Sherie Don, LCSW ?Phone Number: ?05/18/2021, 3:24 PM ? ?Clinical Narrative: CSW notified by [place] in admissions with [facility] that the [employer] will not approve the patient to go to the facility because the in-house physician is not in-network with the patient's specific plan. CSW updated patient that she will have to choose another bed offer and [name] and [name] are the only other offers. Patient is conferring with her sister, [name], to chose another facility. ? ?Expected Discharge Plan: [place] ?Barriers to Discharge: Insurance Authorization ? ?Expected Discharge Plan and Services ?Expected Discharge Plan: [place] ? ?Readmission Risk Interventions ?   ? View : No data to display.  ?  ?  ?  ? ? ?

## 2021-05-18 NOTE — Progress Notes (Signed)
?PROGRESS NOTE ? ? ? ?ADDASYN MCBREEN  XBM:[id number] DOB: 1955-03-30 DOA: 05/14/2021 ?PCP: [name], MD  ? ?Brief Narrative: ?66 year old with past medical history significant for hypertension, hyperlipidemia, hypothyroidism, who had a fall while trying to swim, found to have comminuted angulated and displaced right distal femoral fracture.  Orthopedic was consulted and she is status post reduction and retrograde IM fixation of  right distal femoral fracture. ? ?Hospital course complicated by urinary retention and postoperative acute blood loss anemia. ? ?Awaiting skilled nursing facility authorization ? ? ?Assessment & Plan: ?  ?Principal Problem: ?  Femur fracture, right ([place]) ?Active Problems: ?  Hypothyroidism ?  Hyperlipidemia ?  HYPERTENSION, BENIGN SYSTEMIC ?  Restrictive lung disease ?  Femur fracture ([place]) ? ? ?1-Comminuted, angulated displaced right distal femoral fracture: ?-S/p IMN right distal femur on 3/18 by Dr. Lynann Bologna ?-Post operative pain, DVT prophylaxis per ortho.  ?-Ortho recommended touchdown weightbearing on the right lower extremity, Lovenox for DVT prophylaxis. ?-Awaiting skilled nursing authorization ? ?2-Acute urinary retention; ?Foley catheter removed on 3/19, patient voiding spontaneously. ?Resolved. ? ?3-Acute blood loss anemia, postop:\Hemoglobin 04/1810, has been drifting down to 8.7--8.0. ?There is no appears to be acutely bleeding from incision. ?Orthopedic following. ?Patient denies melena. ?Start oral iron.  ? ?Hypertension: Continue with Coreg. ?Holding amlodipine and spironolactone, SBP well controlled.  ? ?Hyperlipidemia: Continue with the statins ? ?History restrictive lung disease: Continue with inhaler ? ?Hypothyroidism: Continue with Synthroid ? ?CKD stage II: ?Continue to monitor ? 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  Need lifestyle modification ? ?Constipation; start laxatives.  ? ? ? ?Estimated body mass index is 45.66 kg/m? as calculated from the following: ?  Height  as of this encounter: '5\' 4"'$  (1.626 m). ?  Weight as of this encounter: 120.7 kg. ? ? ?DVT prophylaxis: Lovenox ?Code Status: Full code ?Family Communication:care discussed with patient ?Disposition Plan:  ?Status is: Inpatient ?Remains inpatient appropriate because: awaiting SNF authorization, monitor Hb ? ? ? ?Consultants:  ?Ortho ?Procedures:  ?s/p IMN right distal femur on 3/18 by Dr. Lynann Bologna ? ?Antimicrobials:  ? ? ?Subjective: ?She is alert, conversant, denies worsening pain. No BM since Saturday.  ? ? ?Objective: ?Vitals:  ? 05/17/21 1958 05/17/21 2039 05/18/21 0559 05/18/21 0739  ?BP:  135/76 137/90   ?Pulse:  83 79   ?Resp:  16 16   ?Temp:  99.6 ?F (37.6 ?C) 98.7 ?F (37.1 ?C)   ?TempSrc:  Oral Oral   ?SpO2: 94% 96% 93% 95%  ?Weight:      ?Height:      ? ? ?Intake/Output Summary (Last 24 hours) at 05/18/2021 1442 ?Last data filed at 05/18/2021 1226 ?Gross per 24 hour  ?Intake --  ?Output 1400 ml  ?Net -1400 ml  ? ?Filed Weights  ? 05/14/21 1400  ?Weight: 120.7 kg  ? ? ?Examination: ? ?General exam: Appears calm and comfortable  ?Respiratory system: Clear to auscultation. Respiratory effort normal. ?Cardiovascular system: S1 & S2 heard, RRR. No JVD, murmurs, rubs, gallops or clicks. No pedal edema. ?Gastrointestinal system: Abdomen is nondistended, soft and nontender. No organomegaly or masses felt. Normal bowel sounds heard. ?Central nervous system: Alert and oriented. No focal neurological deficits. ?Extremities: Right LE with dressing.  ? ? ? ? ?Data Reviewed: I have personally reviewed following labs and imaging studies ? ?CBC: ?Recent Labs  ?Lab 05/14/21 ?1508 05/15/21 ?0330 05/16/21 ?0272 05/16/21 ?1750 05/17/21 ?0309 05/18/21 ?0324  ?WBC 8.2 10.7* 8.6 8.6 9.7 9.5  ?NEUTROABS 5.8  --   --   --   --   --   ?  HGB 12.7 11.2* 8.7* 8.7* 8.5* 8.0*  ?HCT 39.6 34.4* 27.0* 26.8* 26.5* 24.8*  ?MCV 90.6 90.8 90.6 91.2 91.4 91.9  ?PLT 202 191 167 165 175 176  ? ?Basic Metabolic Panel: ?Recent Labs  ?Lab 05/14/21 ?1508  05/15/21 ?0330 05/16/21 ?3154 05/17/21 ?0309  ?NA 135 138 132* 132*  ?K 4.6 5.0 3.9 4.2  ?CL 102 105 100 101  ?CO2 '27 24 25 26  '$ ?GLUCOSE 112* 147* 112* 112*  ?BUN 33* 36* 42* 34*  ?CREATININE 1.08* 1.08* 1.16* 0.96  ?CALCIUM 9.0 8.7* 8.4* 8.4*  ? ?GFR: ?Estimated Creatinine Clearance: 74.8 mL/min (by C-G formula based on SCr of 0.96 mg/dL). ?Liver Function Tests: ?No results for input(s): AST, ALT, ALKPHOS, BILITOT, PROT, ALBUMIN in the last 168 hours. ?No results for input(s): LIPASE, AMYLASE in the last 168 hours. ?No results for input(s): AMMONIA in the last 168 hours. ?Coagulation Profile: ?No results for input(s): INR, PROTIME in the last 168 hours. ?Cardiac Enzymes: ?No results for input(s): CKTOTAL, CKMB, CKMBINDEX, TROPONINI in the last 168 hours. ?BNP (last 3 results) ?No results for input(s): PROBNP in the last 8760 hours. ?HbA1C: ?No results for input(s): HGBA1C in the last 72 hours. ?CBG: ?No results for input(s): GLUCAP in the last 168 hours. ?Lipid Profile: ?No results for input(s): CHOL, HDL, LDLCALC, TRIG, CHOLHDL, LDLDIRECT in the last 72 hours. ?Thyroid Function Tests: ?No results for input(s): TSH, T4TOTAL, FREET4, T3FREE, THYROIDAB in the last 72 hours. ?Anemia Panel: ?No results for input(s): VITAMINB12, FOLATE, FERRITIN, TIBC, IRON, RETICCTPCT in the last 72 hours. ?Sepsis Labs: ?No results for input(s): PROCALCITON, LATICACIDVEN in the last 168 hours. ? ?Recent Results (from the past 240 hour(s))  ?Resp Panel by RT-PCR (Flu A&B, Covid) Nasopharyngeal Swab     Status: None  ? Collection Time: 05/14/21  3:08 PM  ? Specimen: Nasopharyngeal Swab; Nasopharyngeal(NP) swabs in vial transport medium  ?Result Value Ref Range Status  ? SARS Coronavirus 2 by RT PCR NEGATIVE NEGATIVE Final  ?  Comment: (NOTE) ?SARS-CoV-2 target nucleic acids are NOT DETECTED. ? ?The SARS-CoV-2 RNA is generally detectable in upper respiratory ?specimens during the acute phase of infection. The lowest ?concentration of  SARS-CoV-2 viral copies this assay can detect is ?138 copies/mL. A negative result does not preclude SARS-Cov-2 ?infection and should not be used as the sole basis for treatment or ?other patient management decisions. A negative result may occur with  ?improper specimen collection/handling, submission of specimen other ?than nasopharyngeal swab, presence of viral mutation(s) within the ?areas targeted by this assay, and inadequate number of viral ?copies(<138 copies/mL). A negative result must be combined with ?clinical observations, patient history, and epidemiological ?information. The expected result is Negative. ? ?Fact Sheet for Patients:  ?EntrepreneurPulse.com.au ? ?Fact Sheet for Healthcare Providers:  ?IncredibleEmployment.be ? ?This test is no t yet approved or cleared by the [place] FDA and  ?has been authorized for detection and/or diagnosis of SARS-CoV-2 by ?FDA under an Emergency Use Authorization (EUA). This EUA will remain  ?in effect (meaning this test can be used) for the duration of the ?COVID-19 declaration under Section 564(b)(1) of the Act, 21 ?U.S.C.section 360bbb-3(b)(1), unless the authorization is terminated  ?or revoked sooner.  ? ? ?  ? Influenza A by PCR NEGATIVE NEGATIVE Final  ? Influenza B by PCR NEGATIVE NEGATIVE Final  ?  Comment: (NOTE) ?The Xpert Xpress SARS-CoV-2/FLU/RSV plus assay is intended as an aid ?in the diagnosis of influenza from Nasopharyngeal swab specimens and ?should not  be used as a sole basis for treatment. Nasal washings and ?aspirates are unacceptable for Xpert Xpress SARS-CoV-2/FLU/RSV ?testing. ? ?Fact Sheet for Patients: ?EntrepreneurPulse.com.au ? ?Fact Sheet for Healthcare Providers: ?IncredibleEmployment.be ? ?This test is not yet approved or cleared by the [place] FDA and ?has been authorized for detection and/or diagnosis of SARS-CoV-2 by ?FDA under an Emergency Use  Authorization (EUA). This EUA will remain ?in effect (meaning this test can be used) for the duration of the ?COVID-19 declaration under Section 564(b)(1) of the Act, 21 U.S.C. ?section 360bbb-3(b)(1), unless th

## 2021-05-18 NOTE — Progress Notes (Signed)
Physical Therapy Treatment ?Patient Details ?Name: Andrea [name] ?MRN: [id number] ?DOB: 1955/11/30 ?Today's Date: 05/18/2021 ? ? ?History of Present Illness Pt admitted s/p swimming pool injury with R distal femur fx and now s/p IM fixation.  Pt with hx of htn and restrictive lung disease ? ?  ?PT Comments  ? ? POD # 4 am session ?General Comments: AxO x 3 flat effect but slowy warms ups, cooperative.  Retired from [employer]. ?Assisted OOB to recliner required + 2 asist and increased time.  General bed mobility comments: Increased time, use of bedrails, belt assist to manage L LE, control trunk and rotate to/from EOB with use of bed pad; pt is able to scoot up in supine with use rails,   Increased assist to complete scooting.General transfer comment: + 2 asisst to rise from elevated bed and MAX encouragement.  After 3 attempts, pt was able to stand upright with R LE TTWB and excessive support B UE's on walker.  Pt was able to pivot 1/4 turn to recliner with increased time and 50% VC's on proper walker to self distance to maintain R LE TTWB. ?Positioned in recliner and elevated R LE. ?  ?Recommendations for follow up therapy are one component of a multi-disciplinary discharge planning process, led by the attending physician.  Recommendations may be updated based on patient status, additional functional criteria and insurance authorization. ? ?Follow Up Recommendations ? Skilled nursing-short term rehab (<3 hours/day) ?  ?  ?Assistance Recommended at Discharge Frequent or constant Supervision/Assistance  ?Patient can return home with the following Two people to help with walking and/or transfers;A lot of help with bathing/dressing/bathroom;Assistance with cooking/housework;Assist for transportation;Help with stairs or ramp for entrance ?  ?Equipment Recommendations ? Rolling walker (2 wheels);Wheelchair cushion (measurements PT);BSC/3in1  ?  ?Recommendations for Other Services   ? ? ?  ?Precautions / Restrictions  Precautions ?Precautions: Fall ?Restrictions ?Weight Bearing Restrictions: Yes ?RLE Weight Bearing: Touchdown weight bearing  ?  ? ?Mobility ? Bed Mobility ?Overal bed mobility: Needs Assistance ?Bed Mobility: Supine to Sit ?  ?  ?Supine to sit: Mod assist, Max assist, +2 for physical assistance, +2 for safety/equipment ?  ?  ?General bed mobility comments: Increased time, use of bedrails, belt assist to manage L LE, control trunk and rotate to/from EOB with use of bed pad; pt is able to scoot up in supine with use rails,   Increased assist to complete scooting. ?  ? ?Transfers ?Overall transfer level: Needs assistance ?Equipment used: Rolling walker (2 wheels) ?Transfers: Sit to/from Stand ?Sit to Stand: Max assist, Mod assist, +2 physical assistance, +2 safety/equipment, From elevated surface ?  ?  ?  ?  ?  ?General transfer comment: + 2 asisst to rise from elevated bed and MAX encouragement.  After 3 attempts, pt was able to stand upright with R LE TTWB and excessive support B UE's on walker.  Pt was able to pivot 1/4 turn to recliner with increased time and 50% VC's on proper walker to self distance to maintain R LE TTWB. ?  ? ?Ambulation/Gait ?  ?  ?  ?  ?  ?  ?  ?General Gait Details: transfer only ? ? ?Stairs ?  ?  ?  ?  ?  ? ? ?Wheelchair Mobility ?  ? ?Modified Rankin (Stroke Patients Only) ?  ? ? ?  ?Balance   ?  ?  ?  ?  ?  ?  ?  ?  ?  ?  ?  ?  ?  ?  ?  ?  ?  ?  ?  ? ?  ?  Cognition Arousal/Alertness: Awake/alert ?Behavior During Therapy: [facility] for tasks assessed/performed ?Overall Cognitive Status: Within Functional Limits for tasks assessed ?  ?  ?  ?  ?  ?  ?  ?  ?  ?  ?  ?  ?  ?  ?  ?  ?General Comments: AxO x 3 flat effect but slowy warms ups, cooperative.  Retired from [employer]. ?  ?  ? ?  ?Exercises   ? ?  ?General Comments   ?  ?  ? ?Pertinent Vitals/Pain Pain Assessment ?Pain Assessment: 0-10 ?Pain Score: 4  ?Pain Location: R knee ?Pain Descriptors / Indicators: Aching, Grimacing, Guarding, Sore ?Pain  Intervention(s): Monitored during session, Premedicated before session, Repositioned, Ice applied  ? ? ?Home Living   ?  ?  ?  ?  ?  ?  ?  ?  ?  ?   ?  ?Prior Function    ?  ?  ?   ? ?PT Goals (current goals can now be found in the care plan section) Progress towards PT goals: Progressing toward goals ? ?  ?Frequency ? ? ? Min 3X/week ? ? ? ?  ?PT Plan Current plan remains appropriate  ? ? ?Co-evaluation   ?  ?  ?  ?  ? ?  ?AM-PAC PT "6 Clicks" Mobility   ?Outcome Measure ? Help needed turning from your back to your side while in a flat bed without using bedrails?: A Lot ?Help needed moving from lying on your back to sitting on the side of a flat bed without using bedrails?: A Lot ?Help needed moving to and from a bed to a chair (including a wheelchair)?: A Lot ?Help needed standing up from a chair using your arms (e.g., wheelchair or bedside chair)?: A Lot ?Help needed to walk in hospital room?: Total ?Help needed climbing 3-5 steps with a railing? : Total ?6 Click Score: 10 ? ?  ?End of Session Equipment Utilized During Treatment: Gait belt ?Activity Tolerance: Patient limited by fatigue;Patient limited by pain ?Patient left: in chair;with call bell/phone within reach;with chair alarm set ?Nurse Communication: Mobility status ?PT Visit Diagnosis: Difficulty in walking, not elsewhere classified (R26.2);Pain ?Pain - Right/Left: Right ?Pain - part of body: Knee ?  ? ? ?Time: 1135-1200 ?PT Time Calculation (min) (ACUTE ONLY): 25 min ? ?Charges:  $Therapeutic Activity: 23-37 mins          ?          ? ?{Stephane Junkins  PTA ?Acute  Rehabilitation Services ?Pager      [phone] ?Office      [phone] ? ? ? ?

## 2021-05-18 NOTE — Progress Notes (Signed)
Physical Therapy Treatment ?Patient Details ?Name: Andrea [name] ?MRN: [id number] ?DOB: 1955-08-03 ?Today's Date: 05/18/2021 ? ? ?History of Present Illness Pt admitted s/p swimming pool injury with R distal femur fx and now s/p IM fixation.  Pt with hx of htn and restrictive lung disease ? ?  ?PT Comments  ? ? POD # 4 pm session ?Assisted back to bed required + 2 assist and increased effort.  General transfer comment: + 2 asisst to rise from recliner and MAX encouragement.  Pt was able to stand upright with R LE TTWB and excessive support B UE's on walker.  Pt was able to pivot 1/4 turn to bed with increased time and 50% VC's on proper walker to self distance to maintain R LE TTWB. Pt not yet amb. ?Pt will need ST Rehab at SNF prior to returning home.   ?Recommendations for follow up therapy are one component of a multi-disciplinary discharge planning process, led by the attending physician.  Recommendations may be updated based on patient status, additional functional criteria and insurance authorization. ? ?Follow Up Recommendations ? Skilled nursing-short term rehab (<3 hours/day) ?  ?  ?Assistance Recommended at Discharge Frequent or constant Supervision/Assistance  ?Patient can return home with the following Two people to help with walking and/or transfers;A lot of help with bathing/dressing/bathroom;Assistance with cooking/housework;Assist for transportation;Help with stairs or ramp for entrance ?  ?Equipment Recommendations ? Rolling walker (2 wheels);Wheelchair cushion (measurements PT);BSC/3in1  ?  ?Recommendations for Other Services   ? ? ?  ?Precautions / Restrictions Precautions ?Precautions: Fall ?Restrictions ?Weight Bearing Restrictions: Yes ?RLE Weight Bearing: Touchdown weight bearing  ?  ? ?Mobility ? Bed Mobility ?Overal bed mobility: Needs Assistance ?Bed Mobility: Sit to Supine ?  ?  ?Supine to sit: Mod assist, Max assist, +2 for physical assistance, +2 for safety/equipment ?  ?  ?General  bed mobility comments: increased time and effort back to bed fully supporting R LE and positioning to comfort. ?  ? ?Transfers ?Overall transfer level: Needs assistance ?Equipment used: Rolling walker (2 wheels) ?Transfers: Sit to/from Stand ?Sit to Stand: Max assist, Mod assist, +2 physical assistance, +2 safety/equipment, From elevated surface ?  ?  ?  ?  ?  ?General transfer comment: + 2 asisst to rise from recliner and MAX encouragement.  Pt was able to stand upright with R LE TTWB and excessive support B UE's on walker.  Pt was able to pivot 1/4 turn to bed with increased time and 50% VC's on proper walker to self distance to maintain R LE TTWB. ?  ? ?Ambulation/Gait ?  ?  ?  ?  ?  ?  ?  ?General Gait Details: transfer only ? ? ?Stairs ?  ?  ?  ?  ?  ? ? ?Wheelchair Mobility ?  ? ?Modified Rankin (Stroke Patients Only) ?  ? ? ?  ?Balance   ?  ?  ?  ?  ?  ?  ?  ?  ?  ?  ?  ?  ?  ?  ?  ?  ?  ?  ?  ? ?  ?Cognition Arousal/Alertness: Awake/alert ?Behavior During Therapy: [facility] for tasks assessed/performed ?Overall Cognitive Status: Within Functional Limits for tasks assessed ?  ?  ?  ?  ?  ?  ?  ?  ?  ?  ?  ?  ?  ?  ?  ?  ?General Comments: AxO x 3 flat effect but slowy warms ups,  cooperative.  Retired from [employer]. ?  ?  ? ?  ?Exercises   ? ?  ?General Comments   ?  ?  ? ?Pertinent Vitals/Pain Pain Assessment ?Pain Assessment: 0-10 ?Pain Score: 4  ?Pain Location: R knee ?Pain Descriptors / Indicators: Aching, Grimacing, Guarding, Sore ?Pain Intervention(s): Monitored during session, Premedicated before session, Repositioned, Ice applied  ? ? ?Home Living   ?  ?  ?  ?  ?  ?  ?  ?  ?  ?   ?  ?Prior Function    ?  ?  ?   ? ?PT Goals (current goals can now be found in the care plan section) Progress towards PT goals: Progressing toward goals ? ?  ?Frequency ? ? ? Min 3X/week ? ? ? ?  ?PT Plan Current plan remains appropriate  ? ? ?Co-evaluation   ?  ?  ?  ?  ? ?  ?AM-PAC PT "6 Clicks" Mobility   ?Outcome Measure ? Help  needed turning from your back to your side while in a flat bed without using bedrails?: A Lot ?Help needed moving from lying on your back to sitting on the side of a flat bed without using bedrails?: A Lot ?Help needed moving to and from a bed to a chair (including a wheelchair)?: A Lot ?Help needed standing up from a chair using your arms (e.g., wheelchair or bedside chair)?: A Lot ?Help needed to walk in hospital room?: Total ?Help needed climbing 3-5 steps with a railing? : Total ?6 Click Score: 10 ? ?  ?End of Session Equipment Utilized During Treatment: Gait belt ?Activity Tolerance: Patient limited by fatigue;Patient limited by pain ?Patient left: in bed ?Nurse Communication: Mobility status ?PT Visit Diagnosis: Difficulty in walking, not elsewhere classified (R26.2);Pain ?Pain - Right/Left: Right ?Pain - part of body: Knee ?  ? ? ?Time: 5035-4656 ?PT Time Calculation (min) (ACUTE ONLY): 23 min ? ?Charges:  $Therapeutic Activity: 23-37 mins          ?          ? ?Rica Koyanagi  PTA ?Acute  Rehabilitation Services ?Pager      [phone] ?Office      [phone] ? ? ?

## 2021-05-19 DIAGNOSIS — S7291XD Unspecified fracture of right femur, subsequent encounter for closed fracture with routine healing: Secondary | ICD-10-CM | POA: Diagnosis not present

## 2021-05-19 LAB — CBC
HCT: 24.6 % — ABNORMAL LOW (ref 36.0–46.0)
Hemoglobin: 7.8 g/dL — ABNORMAL LOW (ref 12.0–15.0)
MCH: 29.4 pg (ref 26.0–34.0)
MCHC: 31.7 g/dL (ref 30.0–36.0)
MCV: 92.8 fL (ref 80.0–100.0)
Platelets: 183 10*3/uL (ref 150–400)
RBC: 2.65 MIL/uL — ABNORMAL LOW (ref 3.87–5.11)
RDW: 15.1 % (ref 11.5–15.5)
WBC: 8.4 10*3/uL (ref 4.0–10.5)
nRBC: 0.6 % — ABNORMAL HIGH (ref 0.0–0.2)

## 2021-05-19 LAB — IRON AND TIBC
Iron: 42 ug/dL (ref 28–170)
Saturation Ratios: 17 % (ref 10.4–31.8)
TIBC: 248 ug/dL — ABNORMAL LOW (ref 250–450)
UIBC: 206 ug/dL

## 2021-05-19 LAB — RETICULOCYTES
Immature Retic Fract: 38.3 % — ABNORMAL HIGH (ref 2.3–15.9)
RBC.: 2.64 MIL/uL — ABNORMAL LOW (ref 3.87–5.11)
Retic Count, Absolute: 68.6 10*3/uL (ref 19.0–186.0)
Retic Ct Pct: 2.6 % (ref 0.4–3.1)

## 2021-05-19 LAB — FERRITIN: Ferritin: 215 ng/mL (ref 11–307)

## 2021-05-19 LAB — VITAMIN B12: Vitamin B-12: 731 pg/mL (ref 180–914)

## 2021-05-19 LAB — FOLATE: Folate: 7.3 ng/mL (ref >5.9)

## 2021-05-19 MED ORDER — FOLIC ACID 1 MG PO TABS
1.0000 mg | ORAL_TABLET | Freq: Every day | ORAL | Status: DC
  Administered 2021-05-19 – 2021-05-21 (×3): 1 mg via ORAL
  Filled 2021-05-19 (×3): qty 1

## 2021-05-19 MED ORDER — LACTULOSE 10 GM/15ML PO SOLN
30.0000 g | Freq: Once | ORAL | Status: AC
  Administered 2021-05-19: 30 g via ORAL
  Filled 2021-05-19: qty 45

## 2021-05-19 NOTE — TOC Progression Note (Addendum)
Transition of Care (TOC) - Progression Note  ? ? ?Patient Details  ?Name: Andrea [name] ?MRN: [id number] ?Date of Birth: Jun 10, 1955 ? ?Transition of Care (TOC) CM/SW Contact  ?Servando Snare, LCSW ?Phone Number: ?05/19/2021, 10:52 AM ? ?Clinical Narrative:   Patient agreeable to [facility]. TOC left voicemail for admissions coordinator at [facility].Facility accepted and is starting [employer].  ? ? ? ?Expected Discharge Plan: [place] ?Barriers to Discharge: Insurance Authorization ? ?Expected Discharge Plan and Services ?Expected Discharge Plan: [place] ?  ?  ?  ?  ?                ?  ?  ?  ?  ?  ?  ?  ?  ?  ?  ? ? ?Social Determinants of Health (SDOH) Interventions ?  ? ?Readmission Risk Interventions ?   ? View : No data to display.  ?  ?  ?  ? ? ?

## 2021-05-19 NOTE — Progress Notes (Signed)
Occupational Therapy Treatment ?Patient Details ?Name: Andrea [name] ?MRN: [id number] ?DOB: 1955-11-20 ?Today's Date: 05/19/2021 ? ? ?History of present illness Pt admitted s/p swimming pool injury with R distal femur fx and now s/p IM fixation.  Pt with hx of htn and restrictive lung disease ?  ?OT comments ? First entered patient room to work with her however patient becomes tearful. Patient using gait belt to assist with R LE to edge of bed and was able to to bring L LE off edge of bed. RN arrive to provide pain medication and patient requesting to get back into bed. Patient stating not in much pain just feeling frustrated as she wants to go home. Returned ~50 mins later and patient in better spirits and needing significant time to mobilize. Overall patient was min G for bed mobility with head of bed significantly elevated, heavy use of bed rails, and use of gait belt to assist with R LE. Reviewed patient's WB status prior to standing however patient has max difficulty maintaining. Cues to push from bed however patient mod +2 to power up to standing, then min +2 to take few steps to recliner. Will continue to follow acutely.   ? ?Recommendations for follow up therapy are one component of a multi-disciplinary discharge planning process, led by the attending physician.  Recommendations may be updated based on patient status, additional functional criteria and insurance authorization. ?   ?Follow Up Recommendations ? Skilled nursing-short term rehab (<3 hours/day)  ?  ?Assistance Recommended at Discharge Frequent or constant Supervision/Assistance  ?Patient can return home with the following ? Two people to help with walking and/or transfers;Two people to help with bathing/dressing/bathroom;Direct supervision/assist for medications management;Help with stairs or ramp for entrance;Assist for transportation;Direct supervision/assist for financial management;Assistance with cooking/housework ?  ?Equipment  Recommendations ? Other (comment) (RW and drop arm bedside commode)  ?  ?   ?Precautions / Restrictions Precautions ?Precautions: Fall ?Restrictions ?Weight Bearing Restrictions: Yes ?RLE Weight Bearing: Touchdown weight bearing  ? ? ?  ? ?Mobility Bed Mobility ?Overal bed mobility: Needs Assistance ?Bed Mobility: Supine to Sit ?  ?  ?Supine to sit: Min guard, HOB elevated ?  ?  ?General bed mobility comments: Significant time needed, use of gait belt to assist with R LE to edge of bed. Head of bed significantly elevated. ?  ? ? ?  ?Balance Overall balance assessment: Needs assistance ?Sitting-balance support: Feet supported, No upper extremity supported ?Sitting balance-Leahy Scale: Fair ?  ?  ?Standing balance support: Bilateral upper extremity supported, Reliant on assistive device for balance ?Standing balance-Leahy Scale: Poor ?  ?  ?  ?  ?  ?  ?  ?  ?  ?  ?  ?  ?   ? ?ADL either performed or assessed with clinical judgement  ? ?ADL Overall ADL's : Needs assistance/impaired ?  ?  ?  ?  ?  ?  ?  ?  ?  ?  ?  ?  ?Toilet Transfer: Moderate assistance;+2 for physical assistance;+2 for safety/equipment;Cueing for safety;Cueing for sequencing;Stand-pivot;Rolling walker (2 wheels) ?Toilet Transfer Details (indicate cue type and reason): To recliner chair. Patient needing mod +2 to power up to standing from edge of bed. Reviewed WB status with patient however unable to maintain. Min +2 to take few steps to recliner with increased time ?  ?  ?  ?  ?  ?  ?  ? ? ? ?Cognition Arousal/Alertness: Awake/alert ?Behavior During Therapy:  (emotionally labile) ?  Overall Cognitive Status: Within Functional Limits for tasks assessed ?  ?  ?  ?  ?  ?  ?  ?  ?  ?  ?  ?  ?  ?  ?  ?  ?General Comments: Patient initially tearful discouraged because she wants to go home. When re-entered room patient more emotionally appropriate, needing significant time to mobilize ?  ?  ?   ?   ?   ?   ? ? ?Pertinent Vitals/ Pain       Pain  Assessment ?Pain Assessment: Faces ?Faces Pain Scale: Hurts little more ?Pain Location: R LE ?Pain Descriptors / Indicators: Aching, Grimacing, Guarding, Sore ?Pain Intervention(s): Premedicated before session (patient grimacing but stating not much pain) ? ?   ?   ? ?Frequency ? Min 2X/week  ? ? ? ? ?  ?Progress Toward Goals ? ?OT Goals(current goals can now be found in the care plan section) ? Progress towards OT goals: Progressing toward goals ? ?Acute Rehab OT Goals ?Patient Stated Goal: wants to go home ?OT Goal Formulation: With patient ?Time For Goal Achievement: 05/29/21 ?Potential to Achieve Goals: Good ?ADL Goals ?Pt Will Perform Lower Body Dressing: with supervision;with adaptive equipment;sitting/lateral leans ?Pt Will Transfer to Toilet: with supervision;squat pivot transfer;bedside commode ?Pt Will Perform Toileting - Clothing Manipulation and hygiene: with supervision;sit to/from stand;sitting/lateral leans ?Additional ADL Goal #1: Patient will perform 10 min functional activity or exercise activity as evidence of improving activity tolerance  ?Plan Discharge plan remains appropriate   ? ?   ?AM-PAC OT "6 Clicks" Daily Activity     ?Outcome Measure ? ? Help from another person eating meals?: A Little ?Help from another person taking care of personal grooming?: A Little ?Help from another person toileting, which includes using toliet, bedpan, or urinal?: Total ?Help from another person bathing (including washing, rinsing, drying)?: A Lot ?Help from another person to put on and taking off regular upper body clothing?: A Little ?Help from another person to put on and taking off regular lower body clothing?: A Lot ?6 Click Score: 14 ? ?  ?End of Session Equipment Utilized During Treatment: Rolling walker (2 wheels);Gait belt ? ?OT Visit Diagnosis: Unsteadiness on feet (R26.81);Muscle weakness (generalized) (M62.81);History of falling (Z91.81) ?  ?Activity Tolerance Patient tolerated treatment well ?   ?Patient Left in bed;with call bell/phone within reach;with bed alarm set ?  ?Nurse Communication Mobility status ?  ? ?   ? ?Time: 8502-7741 ?OT Time Calculation (min): 42 min ? ?Charges: OT General Charges ?$OT Visit: 1 Visit ?OT Treatments ?$Self Care/Home Management : 23-37 mins ?$Therapeutic Activity: 8-22 mins ? ?Delbert Phenix OT ?OT pager: [phone] ? ? ?Rosemary Holms ?05/19/2021, 2:41 PM ?

## 2021-05-19 NOTE — Plan of Care (Signed)
  Problem: Nutrition: Goal: Adequate nutrition will be maintained Outcome: Progressing   Problem: Coping: Goal: Level of anxiety will decrease Outcome: Progressing   Problem: Pain Managment: Goal: General experience of comfort will improve Outcome: Progressing   

## 2021-05-19 NOTE — Progress Notes (Signed)
?PROGRESS NOTE ? ? ? ?Andrea [name]  LMB:[id number] DOB: October 29, 1955 DOA: 05/14/2021 ?PCP: [name], MD  ? ?Brief Narrative: ?66 year old with past medical history significant for hypertension, hyperlipidemia, hypothyroidism, who had a fall while trying to swim, found to have comminuted angulated and displaced right distal femoral fracture.  Orthopedic was consulted and she is status post reduction and retrograde IM fixation of  right distal femoral fracture. ? ?Hospital course complicated by urinary retention and postoperative acute blood loss anemia. ? ?Awaiting skilled nursing facility authorization ? ? ?Assessment & Plan: ?  ?Principal Problem: ?  Femur fracture, right ([place]) ?Active Problems: ?  Hypothyroidism ?  Hyperlipidemia ?  HYPERTENSION, BENIGN SYSTEMIC ?  Restrictive lung disease ?  Femur fracture ([place]) ? ? ?1-Comminuted, angulated displaced right distal femoral fracture: ?-S/p IMN right distal femur on 3/18 by Dr. Lynann Bologna ?-Post operative pain, DVT prophylaxis per ortho.  ?-Ortho recommended touchdown weightbearing on the right lower extremity, Lovenox for DVT prophylaxis. ?-Awaiting skilled nursing authorization and stabilization of hb.  ? ?2-Acute urinary retention: ?Foley catheter removed on 3/19, patient voiding spontaneously. ?Resolved. ? ?3-Acute blood loss anemia, postop:\Hemoglobin 04/1810, has been drifting down to 8.7--8.0. ?Does not appears to be acutely bleeding from incision. ?Orthopedic following. ?Patient denies melena. ?Started  oral iron.  ?Hb drop again  to 7.8. she has not had BM, will order occult blood, hold oral iron to prevent confusion with melena. Check anemia panel. Will discussed with orthopedic.  ? ? ?Hypertension: Continue with Coreg. ?Holding amlodipine and spironolactone, SBP well controlled.  ? ?Hyperlipidemia: Continue with the statins ? ?History restrictive lung disease: Continue with inhaler ? ?Hypothyroidism: Continue with Synthroid ? ?CKD stage  II: ?Continue to monitor ? 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  Need lifestyle modification ? ?Constipation; started  laxatives.  ? ? ? ?Estimated body mass index is 45.66 kg/m? as calculated from the following: ?  Height as of this encounter: '5\' 4"'$  (1.626 m). ?  Weight as of this encounter: 120.7 kg. ? ? ?DVT prophylaxis: will hold lovenox ?Code Status: Full code ?Family Communication:care discussed with patient ?Disposition Plan:  ?Status is: Inpatient ?Remains inpatient appropriate because: awaiting SNF authorization, monitor Hb ? ? ? ?Consultants:  ?Ortho ?Procedures:  ?s/p IMN right distal femur on 3/18 by Dr. Lynann Bologna ? ?Antimicrobials:  ? ? ?Subjective: ?She doesn't have any new complaints. She denies pain ? ?Objective: ?Vitals:  ? 05/18/21 1921 05/18/21 2210 05/19/21 0525 05/19/21 0734  ?BP:  128/82 130/81   ?Pulse:  76 75   ?Resp:  16 18   ?Temp:  99.5 ?F (37.5 ?C) 98.7 ?F (37.1 ?C)   ?TempSrc:  Oral Oral   ?SpO2: 98% 96% 96% 96%  ?Weight:      ?Height:      ? ? ?Intake/Output Summary (Last 24 hours) at 05/19/2021 1432 ?Last data filed at 05/19/2021 1007 ?Gross per 24 hour  ?Intake 60 ml  ?Output 500 ml  ?Net -440 ml  ? ? ?Filed Weights  ? 05/14/21 1400  ?Weight: 120.7 kg  ? ? ?Examination: ? ?General exam: NAD ?Respiratory system: CTA ?Cardiovascular system: S 1, S 2 RRR ?Gastrointestinal system: BS present, soft, NT ?Central nervous system: Alert ?Extremities: Right LE with edema.  ? ? ? ? ?Data Reviewed: I have personally reviewed following labs and imaging studies ? ?CBC: ?Recent Labs  ?Lab 05/14/21 ?1508 05/15/21 ?0330 05/16/21 ?1219 05/16/21 ?1750 05/17/21 ?0309 05/18/21 ?7588 05/19/21 ?3254  ?WBC 8.2   < > 8.6  8.6 9.7 9.5 8.4  ?NEUTROABS 5.8  --   --   --   --   --   --   ?HGB 12.7   < > 8.7* 8.7* 8.5* 8.0* 7.8*  ?HCT 39.6   < > 27.0* 26.8* 26.5* 24.8* 24.6*  ?MCV 90.6   < > 90.6 91.2 91.4 91.9 92.8  ?PLT 202   < > 167 165 175 176 183  ? < > = values in this interval not displayed.  ? ? ?Basic Metabolic  Panel: ?Recent Labs  ?Lab 05/14/21 ?1508 05/15/21 ?0330 05/16/21 ?9357 05/17/21 ?0309  ?NA 135 138 132* 132*  ?K 4.6 5.0 3.9 4.2  ?CL 102 105 100 101  ?CO2 '27 24 25 26  '$ ?GLUCOSE 112* 147* 112* 112*  ?BUN 33* 36* 42* 34*  ?CREATININE 1.08* 1.08* 1.16* 0.96  ?CALCIUM 9.0 8.7* 8.4* 8.4*  ? ? ?GFR: ?Estimated Creatinine Clearance: 74.8 mL/min (by C-G formula based on SCr of 0.96 mg/dL). ?Liver Function Tests: ?No results for input(s): AST, ALT, ALKPHOS, BILITOT, PROT, ALBUMIN in the last 168 hours. ?No results for input(s): LIPASE, AMYLASE in the last 168 hours. ?No results for input(s): AMMONIA in the last 168 hours. ?Coagulation Profile: ?No results for input(s): INR, PROTIME in the last 168 hours. ?Cardiac Enzymes: ?No results for input(s): CKTOTAL, CKMB, CKMBINDEX, TROPONINI in the last 168 hours. ?BNP (last 3 results) ?No results for input(s): PROBNP in the last 8760 hours. ?HbA1C: ?No results for input(s): HGBA1C in the last 72 hours. ?CBG: ?No results for input(s): GLUCAP in the last 168 hours. ?Lipid Profile: ?No results for input(s): CHOL, HDL, LDLCALC, TRIG, CHOLHDL, LDLDIRECT in the last 72 hours. ?Thyroid Function Tests: ?No results for input(s): TSH, T4TOTAL, FREET4, T3FREE, THYROIDAB in the last 72 hours. ?Anemia Panel: ?Recent Labs  ?  05/19/21 ?0706  ?VITAMINB12 731  ?FOLATE 7.3  ?FERRITIN 215  ?TIBC 248*  ?IRON 42  ?RETICCTPCT 2.6  ? ?Sepsis Labs: ?No results for input(s): PROCALCITON, LATICACIDVEN in the last 168 hours. ? ?Recent Results (from the past 240 hour(s))  ?Resp Panel by RT-PCR (Flu A&B, Covid) Nasopharyngeal Swab     Status: None  ? Collection Time: 05/14/21  3:08 PM  ? Specimen: Nasopharyngeal Swab; Nasopharyngeal(NP) swabs in vial transport medium  ?Result Value Ref Range Status  ? SARS Coronavirus 2 by RT PCR NEGATIVE NEGATIVE Final  ?  Comment: (NOTE) ?SARS-CoV-2 target nucleic acids are NOT DETECTED. ? ?The SARS-CoV-2 RNA is generally detectable in upper respiratory ?specimens during  the acute phase of infection. The lowest ?concentration of SARS-CoV-2 viral copies this assay can detect is ?138 copies/mL. A negative result does not preclude SARS-Cov-2 ?infection and should not be used as the sole basis for treatment or ?other patient management decisions. A negative result may occur with  ?improper specimen collection/handling, submission of specimen other ?than nasopharyngeal swab, presence of viral mutation(s) within the ?areas targeted by this assay, and inadequate number of viral ?copies(<138 copies/mL). A negative result must be combined with ?clinical observations, patient history, and epidemiological ?information. The expected result is Negative. ? ?Fact Sheet for Patients:  ?EntrepreneurPulse.com.au ? ?Fact Sheet for Healthcare Providers:  ?IncredibleEmployment.be ? ?This test is no t yet approved or cleared by the [place] FDA and  ?has been authorized for detection and/or diagnosis of SARS-CoV-2 by ?FDA under an Emergency Use Authorization (EUA). This EUA will remain  ?in effect (meaning this test can be used) for the duration of the ?COVID-19 declaration under  Section 564(b)(1) of the Act, 21 ?U.S.C.section 360bbb-3(b)(1), unless the authorization is terminated  ?or revoked sooner.  ? ? ?  ? Influenza A by PCR NEGATIVE NEGATIVE Final  ? Influenza B by PCR NEGATIVE NEGATIVE Final  ?  Comment: (NOTE) ?The Xpert Xpress SARS-CoV-2/FLU/RSV plus assay is intended as an aid ?in the diagnosis of influenza from Nasopharyngeal swab specimens and ?should not be used as a sole basis for treatment. Nasal washings and ?aspirates are unacceptable for Xpert Xpress SARS-CoV-2/FLU/RSV ?testing. ? ?Fact Sheet for Patients: ?EntrepreneurPulse.com.au ? ?Fact Sheet for Healthcare Providers: ?IncredibleEmployment.be ? ?This test is not yet approved or cleared by the [place] FDA and ?has been authorized for detection and/or  diagnosis of SARS-CoV-2 by ?FDA under an Emergency Use Authorization (EUA). This EUA will remain ?in effect (meaning this test can be used) for the duration of the ?COVID-19 declaration under Section 564(b)(1) of the Act, 21

## 2021-05-19 NOTE — Plan of Care (Signed)
  Problem: Health Behavior/Discharge Planning: Goal: Ability to manage health-related needs will improve Outcome: Progressing   Problem: Clinical Measurements: Goal: Ability to maintain clinical measurements within normal limits will improve Outcome: Progressing   Problem: Activity: Goal: Risk for activity intolerance will decrease Outcome: Progressing   Problem: Pain Managment: Goal: General experience of comfort will improve Outcome: Progressing   Problem: Safety: Goal: Ability to remain free from injury will improve Outcome: Progressing   

## 2021-05-20 ENCOUNTER — Ambulatory Visit: Payer: Medicare HMO | Admitting: Family Medicine

## 2021-05-20 DIAGNOSIS — S7291XD Unspecified fracture of right femur, subsequent encounter for closed fracture with routine healing: Secondary | ICD-10-CM | POA: Diagnosis not present

## 2021-05-20 LAB — CBC
HCT: 23.7 % — ABNORMAL LOW (ref 36.0–46.0)
Hemoglobin: 7.6 g/dL — ABNORMAL LOW (ref 12.0–15.0)
MCH: 29.7 pg (ref 26.0–34.0)
MCHC: 32.1 g/dL (ref 30.0–36.0)
MCV: 92.6 fL (ref 80.0–100.0)
Platelets: 197 10*3/uL (ref 150–400)
RBC: 2.56 MIL/uL — ABNORMAL LOW (ref 3.87–5.11)
RDW: 15.5 % (ref 11.5–15.5)
WBC: 7.4 10*3/uL (ref 4.0–10.5)
nRBC: 0.5 % — ABNORMAL HIGH (ref 0.0–0.2)

## 2021-05-20 LAB — ABO/RH: ABO/RH(D): B POS

## 2021-05-20 LAB — PREPARE RBC (CROSSMATCH)

## 2021-05-20 LAB — OCCULT BLOOD X 1 CARD TO LAB, STOOL: Fecal Occult Bld: NEGATIVE

## 2021-05-20 MED ORDER — FERROUS SULFATE 325 (65 FE) MG PO TABS
325.0000 mg | ORAL_TABLET | Freq: Every day | ORAL | Status: DC
Start: 2021-05-21 — End: 2021-05-21
  Administered 2021-05-21: 325 mg via ORAL
  Filled 2021-05-20: qty 1

## 2021-05-20 MED ORDER — SODIUM CHLORIDE 0.9% IV SOLUTION: Freq: Once | INTRAVENOUS | Status: DC

## 2021-05-20 MED ORDER — BISACODYL 10 MG RE SUPP
10.0000 mg | Freq: Once | RECTAL | Status: AC
  Administered 2021-05-20: 10 mg via RECTAL
  Filled 2021-05-20: qty 1

## 2021-05-20 MED ORDER — LACTULOSE 10 GM/15ML PO SOLN
30.0000 g | Freq: Once | ORAL | Status: AC
  Administered 2021-05-20: 30 g via ORAL
  Filled 2021-05-20: qty 45

## 2021-05-20 NOTE — Progress Notes (Signed)
Physical Therapy Treatment ?Patient Details ?Name: Andrea [name] ?MRN: [id number] ?DOB: [birth year] ?Today's Date: 05/20/2021 ? ? ?History of Present Illness Pt admitted s/p swimming pool injury with R distal femur fx and now s/p IM fixation.  Pt with hx of htn and restrictive lung disease ? ?  ?PT Comments  ? ? Pt seated in recliner upon entry and agreeable to be seen. Required min assist +2 for safety to complete sit to stand and step-pivot transfers, pt unable to maintain her weightbearing precautions even with multimodal cuing. Pt max assist+2 for sit to supine to return to bed. Reviewed exercises with pt for circulation and strengthening and encouraged her to complete them again today. We will continue to follow the pt acutely to promote safe discharge to the destination indicated below.  ?  ?Recommendations for follow up therapy are one component of a multi-disciplinary discharge planning process, led by the attending physician.  Recommendations may be updated based on patient status, additional functional criteria and insurance authorization. ? ?Follow Up Recommendations ? Skilled nursing-short term rehab (<3 hours/day) ?  ?  ?Assistance Recommended at Discharge Frequent or constant Supervision/Assistance  ?Patient can return home with the following Two people to help with walking and/or transfers;A lot of help with bathing/dressing/bathroom;Assistance with cooking/housework;Assist for transportation;Help with stairs or ramp for entrance ?  ?Equipment Recommendations ? Rolling walker (2 wheels);Wheelchair cushion (measurements PT);BSC/3in1  ?  ?Recommendations for Other Services   ? ? ?  ?Precautions / Restrictions Precautions ?Precautions: Fall ?Restrictions ?Weight Bearing Restrictions: Yes ?RLE Weight Bearing: Touchdown weight bearing  ?  ? ?Mobility ? Bed Mobility ?Overal bed mobility: Needs Assistance ?Bed Mobility: Sit to Supine ?  ?  ?  ?Sit to supine: Mod assist, Max assist, +2 for physical  assistance ?  ?General bed mobility comments: Pt required mod-max assist of +2 for bed mobility for lift assist of BLE into the bed and trunk lowering to supine. Once in bed pt able to use LLE and BUE to scoot up in bed with min assist +2 using bed pad. ?  ? ?Transfers ?Overall transfer level: Needs assistance ?Equipment used: Rolling walker (2 wheels) ?Transfers: Sit to/from Stand, Bed to chair/wheelchair/BSC ?Sit to Stand: Min assist, +2 safety/equipment ?  ?Step pivot transfers: Min assist, +2 safety/equipment ?  ?  ?  ?General transfer comment: Pt required min assist + 2 for safety to rise from recliner.  VC for RLE placement during transfer to observe precuations, pt unable to maintain. Upon standnig pt reliant on BUE support on RW and did not maintain precuations. Pt was able to pivot 1/4 turn to bed with increased time, but was bearing ~50% weight on her protected extremity. ?  ? ?Ambulation/Gait ?  ?  ?  ?  ?  ?  ?  ?General Gait Details: deferred ? ? ?Stairs ?  ?  ?  ?  ?  ? ? ?Wheelchair Mobility ?  ? ?Modified Rankin (Stroke Patients Only) ?  ? ? ?  ?Balance Overall balance assessment: Needs assistance ?Sitting-balance support: Feet supported, No upper extremity supported ?Sitting balance-Leahy Scale: Fair ?  ?  ?Standing balance support: Bilateral upper extremity supported, During functional activity, Reliant on assistive device for balance ?Standing balance-Leahy Scale: Poor ?  ?  ?  ?  ?  ?  ?  ?  ?  ?  ?  ?  ?  ? ?  ?Cognition Arousal/Alertness: Awake/alert ?Behavior During Therapy: [facility] for tasks assessed/performed ?Overall Cognitive Status:  Within Functional Limits for tasks assessed ?  ?  ?  ?  ?  ?  ?  ?  ?  ?  ?  ?  ?  ?  ?  ?  ?  ?  ?  ? ?  ?Exercises General Exercises - Lower Extremity ?Ankle Circles/Pumps: AROM, Both, 15 reps, Supine ?Quad Sets: AROM, Both, 5 reps ?Gluteal Sets: AROM, Both, 5 reps ? ?  ?General Comments General comments (skin integrity, edema, etc.): Pt reported dizziness at  times and was noted to have her eyes roll back but symptoms dissipated quickly. ?  ?  ? ?Pertinent Vitals/Pain Pain Assessment ?Pain Assessment: 0-10 ?Pain Score: 1  ?Pain Location: R LE ?Pain Descriptors / Indicators: Aching, Grimacing, Guarding, Sore ?Pain Intervention(s): Limited activity within patient's tolerance, Monitored during session, Repositioned  ? ? ?Home Living   ?  ?  ?  ?  ?  ?  ?  ?  ?  ?   ?  ?Prior Function    ?  ?  ?   ? ?PT Goals (current goals can now be found in the care plan section) Acute Rehab PT Goals ?Patient Stated Goal: regain IND ?PT Goal Formulation: With patient ?Time For Goal Achievement: 05/29/21 ?Potential to Achieve Goals: Fair ?Progress towards PT goals: Progressing toward goals ? ?  ?Frequency ? ? ? Min 3X/week ? ? ? ?  ?PT Plan Current plan remains appropriate  ? ? ?Co-evaluation   ?  ?  ?  ?  ? ?  ?AM-PAC PT "6 Clicks" Mobility   ?Outcome Measure ? Help needed turning from your back to your side while in a flat bed without using bedrails?: A Lot ?Help needed moving from lying on your back to sitting on the side of a flat bed without using bedrails?: A Lot ?Help needed moving to and from a bed to a chair (including a wheelchair)?: A Lot ?Help needed standing up from a chair using your arms (e.g., wheelchair or bedside chair)?: A Lot ?Help needed to walk in hospital room?: Total ?Help needed climbing 3-5 steps with a railing? : Total ?6 Click Score: 10 ? ?  ?End of Session Equipment Utilized During Treatment: Gait belt ?Activity Tolerance: Patient limited by fatigue;Patient limited by pain ?Patient left: in bed;with call bell/phone within reach;with bed alarm set ?Nurse Communication: Mobility status ?PT Visit Diagnosis: Difficulty in walking, not elsewhere classified (R26.2);Pain ?Pain - Right/Left: Right ?Pain - part of body: Hip ?  ? ? ?Time: 1324-4010 ?PT Time Calculation (min) (ACUTE ONLY): 25 min ? ?Charges:  $Therapeutic Activity: 8-22 mins          ?          ? ?Coolidge Breeze, PT, DPT ?[facility] ?Office: [phone] ?Pager: [phone] ? ? ? ?Coolidge Breeze ?05/20/2021, 12:18 PM ? ?

## 2021-05-20 NOTE — Progress Notes (Signed)
Occupational Therapy Progress Note ? ?Patient needing increased time for all mobility. Utilizes gait belt to assist moving R LE to edge of bed. Patient needing mod +2 to power up to standing from edge of bed. Once standing OT with foot underneath patient's R LE and educate to use upper extremities to try and maintain TTWB restrictions however patient unable. Also try and cue patient to pivot on L foot however max difficulty and keeps lifting heel trying to step with L LE. Set up with breakfast at end of session. Will continue with acute OT services to progress patient functional transfers and self care.  ? ? 05/20/21 1300  ?OT Visit Information  ?Last OT Received On 05/20/21  ?Assistance Needed +2  ?History of Present Illness Pt admitted s/p swimming pool injury with R distal femur fx and now s/p IM fixation.  Pt with hx of htn and restrictive lung disease  ?Precautions  ?Precautions Fall  ?Restrictions  ?Weight Bearing Restrictions Yes  ?RLE Weight Bearing TWB  ?Pain Assessment  ?Pain Assessment Faces  ?Faces Pain Scale 4  ?Pain Location R LE  ?Pain Descriptors / Indicators Aching;Grimacing;Guarding;Sore  ?Pain Intervention(s) Monitored during session;Premedicated before session  ?Cognition  ?Arousal/Alertness Awake/alert  ?Behavior During Therapy [facility] for tasks assessed/performed  ?Overall Cognitive Status No family/caregiver present to determine baseline cognitive functioning  ?General Comments Patient having difficulty following directions to try and pivot on L LE during transfer  ?ADL  ?Overall ADL's  Needs assistance/impaired  ?Toilet Transfer Moderate assistance;+2 for physical assistance;+2 for safety/equipment;Cueing for safety;Cueing for sequencing;Stand-pivot;Rolling walker (2 wheels)  ?Toilet Transfer Details (indicate cue type and reason) To recliner chair. OT with foot underneath patient's R LE and educate to use upper extremities to try and maintain TTWB restrictions however patient unable. Also try  and cue patient to pivot on L foot however max difficulty and keeps lifting heel trying to step with L LE. Takes significant time  ?Functional mobility during ADLs Moderate assistance;+2 for physical assistance;+2 for safety/equipment  ?Bed Mobility  ?Overal bed mobility Needs Assistance  ?Bed Mobility Supine to Sit  ?Sit to supine Supervision;HOB elevated  ?General bed mobility comments Significant time needed, use of gait belt to assist mobilizing R LE towards edge of bed  ?Balance  ?Overall balance assessment Needs assistance  ?Sitting-balance support Feet supported;No upper extremity supported  ?Sitting balance-Leahy Scale Fair  ?Standing balance support Bilateral upper extremity supported;During functional activity;Reliant on assistive device for balance  ?Standing balance-Leahy Scale Poor  ?OT - End of Session  ?Equipment Utilized During [employer] walker (2 wheels);Gait belt  ?Activity Tolerance Patient tolerated treatment well  ?Patient left in chair;with call bell/phone within reach;with chair alarm set  ?Nurse Communication Mobility status  ?OT Assessment/Plan  ?OT Plan Discharge plan remains appropriate  ?OT Visit Diagnosis Unsteadiness on feet (R26.81);Muscle weakness (generalized) (M62.81);History of falling (Z91.81)  ?OT Frequency (ACUTE ONLY) Min 2X/week  ?Follow Up Recommendations Skilled nursing-short term rehab (<3 hours/day)  ?Assistance recommended at discharge Frequent or constant Supervision/Assistance  ?Patient can return home with the following Two people to help with walking and/or transfers;Two people to help with bathing/dressing/bathroom;Direct supervision/assist for medications management;Help with stairs or ramp for entrance;Assist for transportation;Direct supervision/assist for financial management;Assistance with cooking/housework  ?OT Equipment Other (comment) ?(RW and drop arm commode)  ?AM-PAC OT "6 Clicks" Daily Activity Outcome Measure (Version 2)  ?Help from another  person eating meals? 3  ?Help from another person taking care of personal grooming? 3  ?Help from  another person toileting, which includes using toliet, bedpan, or urinal? 1  ?Help from another person bathing (including washing, rinsing, drying)? 2  ?Help from another person to put on and taking off regular upper body clothing? 3  ?Help from another person to put on and taking off regular lower body clothing? 2  ?6 Click Score 14  ?Progressive Mobility  ?What is the highest level of mobility based on the progressive mobility assessment? Level 3 (Stands with assist) - Balance while standing  and cannot march in place  ?Activity Transferred from bed to chair  ?OT Goal Progression  ?Progress towards OT goals Progressing toward goals  ?Acute Rehab OT Goals  ?Patient Stated Goal go home  ?OT Goal Formulation With patient  ?Time For Goal Achievement 05/29/21  ?Potential to Achieve Goals Good  ?ADL Goals  ?Pt Will Perform Lower Body Dressing with supervision;with adaptive equipment;sitting/lateral leans  ?Pt Will Transfer to Toilet with supervision;squat pivot transfer;bedside commode  ?Pt Will Perform Toileting - Clothing Manipulation and hygiene with supervision;sit to/from stand;sitting/lateral leans  ?Additional ADL Goal #1 Patient will perform 10 min functional activity or exercise activity as evidence of improving activity tolerance  ?OT Time Calculation  ?OT Start Time (ACUTE ONLY) 1610  ?OT Stop Time (ACUTE ONLY) 0935  ?OT Time Calculation (min) 31 min  ?OT General Charges  ?$OT Visit 1 Visit  ?OT Treatments  ?$Self Care/Home Management  23-37 mins  ? ?

## 2021-05-20 NOTE — TOC Progression Note (Signed)
Transition of Care (TOC) - Progression Note  ? ? ?Patient Details  ?Name: Andrea [name] ?MRN: [id number] ?Date of Birth: 09/01/1955 ? ?Transition of Care (TOC) CM/SW Contact  ?Servando Snare, LCSW ?Phone Number: ?05/20/2021, 9:46 AM ? ?Clinical Narrative:   Patient has a bed at [facility] and can dc to facility tomorrow.  ? ? ? ?Expected Discharge Plan: [place] ?Barriers to Discharge: Insurance Authorization ? ?Expected Discharge Plan and Services ?Expected Discharge Plan: [place] ?  ?  ?  ?  ?                ?  ?  ?  ?  ?  ?  ?  ?  ?  ?  ? ? ?Social Determinants of Health (SDOH) Interventions ?  ? ?Readmission Risk Interventions ?   ? View : No data to display.  ?  ?  ?  ? ? ?

## 2021-05-20 NOTE — Plan of Care (Signed)

## 2021-05-20 NOTE — Plan of Care (Signed)
  Problem: Health Behavior/Discharge Planning: Goal: Ability to manage health-related needs will improve Outcome: Progressing   Problem: Clinical Measurements: Goal: Ability to maintain clinical measurements within normal limits will improve Outcome: Progressing   Problem: Activity: Goal: Risk for activity intolerance will decrease Outcome: Progressing   Problem: Pain Managment: Goal: General experience of comfort will improve Outcome: Progressing   

## 2021-05-20 NOTE — Progress Notes (Signed)
?PROGRESS NOTE ? ? ? ?TEYA OTTERSON  DUK:[id number] DOB: 1955-10-06 DOA: 05/14/2021 ?PCP: [name], MD  ? ?Brief Narrative: ?66 year old with past medical history significant for hypertension, hyperlipidemia, hypothyroidism, who had a fall while trying to swim, found to have comminuted angulated and displaced right distal femoral fracture.  Orthopedic was consulted and she is status post reduction and retrograde IM fixation of  right distal femoral fracture. ? ?Hospital course complicated by urinary retention and postoperative acute blood loss anemia. ? ?Awaiting skilled nursing facility authorization ? ? ?Assessment & Plan: ?  ?Principal Problem: ?  Femur fracture, right ([place]) ?Active Problems: ?  Hypothyroidism ?  Hyperlipidemia ?  HYPERTENSION, BENIGN SYSTEMIC ?  Restrictive lung disease ?  Femur fracture ([place]) ? ? ?1-Comminuted, angulated displaced right distal femoral fracture: ?-S/p IMN right distal femur on 3/18 by Dr. Lynann Bologna ?-Post operative pain, DVT prophylaxis per ortho.  ?-Ortho recommended touchdown weightbearing on the right lower extremity, Lovenox for DVT prophylaxis. ?-Awaiting skilled nursing authorization and stabilization of hb.  ? ?2-Acute urinary retention: ?Foley catheter removed on 3/19, patient voiding spontaneously. ?Resolved. ? ?3-Acute blood loss anemia, postop:\Hemoglobin 04/1810, has been drifting down to 8.7--8.0. ?Does not appears to be acutely bleeding from incision. ?Orthopedic following. ?Patient denies melena. ?Started  oral iron.  ?Hb down to 7.6 form 7.8 yesterday. Rectal exam negative for melena, occult blood send.  ?Discussed with Dr Starla Link we can consider arterial bleed, he recommended vascular evaluation. I discussed with Dr Virl Cagey case, he relates drop from 11 to 8.7 likely post op blood loss. From 8.7 down to 7.6 in several days does not correlates with severe arterial bleed. Also patient would have more pain ad decreased pulse and would be more sick.  Recommend to hold on getting CT angio of leg for now, rule out GI bleed.  ?-I discussed care with patient, she would like to hold on getting CT angio at this time.  ?-She will get one unit PRBC today.  ? ?Hypertension: Continue with Coreg. ?Holding amlodipine and spironolactone, SBP well controlled.  ? ?Hyperlipidemia: Continue with the statins. ? ?History restrictive lung disease: Continue with inhaler. ? ?Hypothyroidism: Continue with Synthroid. ? ?CKD stage II: ?Continue to monitor ? 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  Need lifestyle modification ? ?Constipation; started  laxatives. Plan for suppository  ? ? ? ?Estimated body mass index is 45.66 kg/m? as calculated from the following: ?  Height as of this encounter: '5\' 4"'$  (1.626 m). ?  Weight as of this encounter: 120.7 kg. ? ? ?DVT prophylaxis: will hold lovenox ?Code Status: Full code ?Family Communication:care discussed with patient ?Disposition Plan:  ?Status is: Inpatient ?Remains inpatient appropriate because: awaiting SNF authorization, monitor Hb ? ? ? ?Consultants:  ?Ortho ?Procedures:  ?s/p IMN right distal femur on 3/18 by Dr. Lynann Bologna ? ?Antimicrobials:  ? ? ?Subjective: ?No BM yet, denies abdominal pain.  ? ? ?Objective: ?Vitals:  ? 05/19/21 2151 05/20/21 0603 05/20/21 0908 05/20/21 1357  ?BP: 124/77 138/80  114/85  ?Pulse: 77 68  69  ?Resp: '18 18  20  '$ ?Temp: 98.7 ?F (37.1 ?C) 97.8 ?F (36.6 ?C)  98.8 ?F (37.1 ?C)  ?TempSrc: Oral Oral  Oral  ?SpO2: 94% 99% 98% 100%  ?Weight:      ?Height:      ? ? ?Intake/Output Summary (Last 24 hours) at 05/20/2021 1433 ?Last data filed at 05/20/2021 [phone] ?Gross per 24 hour  ?Intake --  ?Output 400 ml  ?Net -400 ml  ? ? ?  Filed Weights  ? 05/14/21 1400  ?Weight: 120.7 kg  ? ? ?Examination: ? ?General exam: NAD ?Respiratory system: CTA ?Cardiovascular system: S 1, S 2 RRR ?Gastrointestinal system: BS present, soft, nt,.  ?Central nervous system: Alert ?Extremities: Right LE edema ? ? ? ? ?Data Reviewed: I have personally reviewed  following labs and imaging studies ? ?CBC: ?Recent Labs  ?Lab 05/14/21 ?1508 05/15/21 ?0330 05/16/21 ?1750 05/17/21 ?0309 05/18/21 ?4315 05/19/21 ?4008 05/20/21 ?0315  ?WBC 8.2   < > 8.6 9.7 9.5 8.4 7.4  ?NEUTROABS 5.8  --   --   --   --   --   --   ?HGB 12.7   < > 8.7* 8.5* 8.0* 7.8* 7.6*  ?HCT 39.6   < > 26.8* 26.5* 24.8* 24.6* 23.7*  ?MCV 90.6   < > 91.2 91.4 91.9 92.8 92.6  ?PLT 202   < > 165 175 176 183 197  ? < > = values in this interval not displayed.  ? ? ?Basic Metabolic Panel: ?Recent Labs  ?Lab 05/14/21 ?1508 05/15/21 ?0330 05/16/21 ?6761 05/17/21 ?0309  ?NA 135 138 132* 132*  ?K 4.6 5.0 3.9 4.2  ?CL 102 105 100 101  ?CO2 '27 24 25 26  '$ ?GLUCOSE 112* 147* 112* 112*  ?BUN 33* 36* 42* 34*  ?CREATININE 1.08* 1.08* 1.16* 0.96  ?CALCIUM 9.0 8.7* 8.4* 8.4*  ? ? ?GFR: ?Estimated Creatinine Clearance: 74.8 mL/min (by C-G formula based on SCr of 0.96 mg/dL). ?Liver Function Tests: ?No results for input(s): AST, ALT, ALKPHOS, BILITOT, PROT, ALBUMIN in the last 168 hours. ?No results for input(s): LIPASE, AMYLASE in the last 168 hours. ?No results for input(s): AMMONIA in the last 168 hours. ?Coagulation Profile: ?No results for input(s): INR, PROTIME in the last 168 hours. ?Cardiac Enzymes: ?No results for input(s): CKTOTAL, CKMB, CKMBINDEX, TROPONINI in the last 168 hours. ?BNP (last 3 results) ?No results for input(s): PROBNP in the last 8760 hours. ?HbA1C: ?No results for input(s): HGBA1C in the last 72 hours. ?CBG: ?No results for input(s): GLUCAP in the last 168 hours. ?Lipid Profile: ?No results for input(s): CHOL, HDL, LDLCALC, TRIG, CHOLHDL, LDLDIRECT in the last 72 hours. ?Thyroid Function Tests: ?No results for input(s): TSH, T4TOTAL, FREET4, T3FREE, THYROIDAB in the last 72 hours. ?Anemia Panel: ?Recent Labs  ?  05/19/21 ?0706  ?VITAMINB12 731  ?FOLATE 7.3  ?FERRITIN 215  ?TIBC 248*  ?IRON 42  ?RETICCTPCT 2.6  ? ? ?Sepsis Labs: ?No results for input(s): PROCALCITON, LATICACIDVEN in the last 168  hours. ? ?Recent Results (from the past 240 hour(s))  ?Resp Panel by RT-PCR (Flu A&B, Covid) Nasopharyngeal Swab     Status: None  ? Collection Time: 05/14/21  3:08 PM  ? Specimen: Nasopharyngeal Swab; Nasopharyngeal(NP) swabs in vial transport medium  ?Result Value Ref Range Status  ? SARS Coronavirus 2 by RT PCR NEGATIVE NEGATIVE Final  ?  Comment: (NOTE) ?SARS-CoV-2 target nucleic acids are NOT DETECTED. ? ?The SARS-CoV-2 RNA is generally detectable in upper respiratory ?specimens during the acute phase of infection. The lowest ?concentration of SARS-CoV-2 viral copies this assay can detect is ?138 copies/mL. A negative result does not preclude SARS-Cov-2 ?infection and should not be used as the sole basis for treatment or ?other patient management decisions. A negative result may occur with  ?improper specimen collection/handling, submission of specimen other ?than nasopharyngeal swab, presence of viral mutation(s) within the ?areas targeted by this assay, and inadequate number of viral ?copies(<138 copies/mL). A negative  result must be combined with ?clinical observations, patient history, and epidemiological ?information. The expected result is Negative. ? ?Fact Sheet for Patients:  ?EntrepreneurPulse.com.au ? ?Fact Sheet for Healthcare Providers:  ?IncredibleEmployment.be ? ?This test is no t yet approved or cleared by the [place] FDA and  ?has been authorized for detection and/or diagnosis of SARS-CoV-2 by ?FDA under an Emergency Use Authorization (EUA). This EUA will remain  ?in effect (meaning this test can be used) for the duration of the ?COVID-19 declaration under Section 564(b)(1) of the Act, 21 ?U.S.C.section 360bbb-3(b)(1), unless the authorization is terminated  ?or revoked sooner.  ? ? ?  ? Influenza A by PCR NEGATIVE NEGATIVE Final  ? Influenza B by PCR NEGATIVE NEGATIVE Final  ?  Comment: (NOTE) ?The Xpert Xpress SARS-CoV-2/FLU/RSV plus assay is intended  as an aid ?in the diagnosis of influenza from Nasopharyngeal swab specimens and ?should not be used as a sole basis for treatment. Nasal washings and ?aspirates are unacceptable for Xpert Xpress SARS-CoV-2/FLU/RSV ?

## 2021-05-21 DIAGNOSIS — Z6841 Body Mass Index (BMI) 40.0 and over, adult: Secondary | ICD-10-CM | POA: Diagnosis not present

## 2021-05-21 DIAGNOSIS — I150 Renovascular hypertension: Secondary | ICD-10-CM | POA: Diagnosis not present

## 2021-05-21 DIAGNOSIS — S72001D Fracture of unspecified part of neck of right femur, subsequent encounter for closed fracture with routine healing: Secondary | ICD-10-CM | POA: Diagnosis not present

## 2021-05-21 DIAGNOSIS — J984 Other disorders of lung: Secondary | ICD-10-CM | POA: Diagnosis not present

## 2021-05-21 DIAGNOSIS — S7291XB Unspecified fracture of right femur, initial encounter for open fracture type I or II: Secondary | ICD-10-CM | POA: Diagnosis not present

## 2021-05-21 DIAGNOSIS — N182 Chronic kidney disease, stage 2 (mild): Secondary | ICD-10-CM | POA: Diagnosis not present

## 2021-05-21 DIAGNOSIS — Z9889 Other specified postprocedural states: Secondary | ICD-10-CM | POA: Diagnosis not present

## 2021-05-21 DIAGNOSIS — E785 Hyperlipidemia, unspecified: Secondary | ICD-10-CM | POA: Diagnosis not present

## 2021-05-21 DIAGNOSIS — D62 Acute posthemorrhagic anemia: Secondary | ICD-10-CM | POA: Diagnosis not present

## 2021-05-21 DIAGNOSIS — R69 Illness, unspecified: Secondary | ICD-10-CM | POA: Diagnosis not present

## 2021-05-21 DIAGNOSIS — D649 Anemia, unspecified: Secondary | ICD-10-CM | POA: Diagnosis not present

## 2021-05-21 DIAGNOSIS — Z743 Need for continuous supervision: Secondary | ICD-10-CM | POA: Diagnosis not present

## 2021-05-21 DIAGNOSIS — F330 Major depressive disorder, recurrent, mild: Secondary | ICD-10-CM | POA: Diagnosis not present

## 2021-05-21 DIAGNOSIS — Z7401 Bed confinement status: Secondary | ICD-10-CM | POA: Diagnosis not present

## 2021-05-21 DIAGNOSIS — N289 Disorder of kidney and ureter, unspecified: Secondary | ICD-10-CM | POA: Diagnosis not present

## 2021-05-21 DIAGNOSIS — S7291XD Unspecified fracture of right femur, subsequent encounter for closed fracture with routine healing: Secondary | ICD-10-CM | POA: Diagnosis not present

## 2021-05-21 DIAGNOSIS — I509 Heart failure, unspecified: Secondary | ICD-10-CM | POA: Diagnosis not present

## 2021-05-21 DIAGNOSIS — S7291XA Unspecified fracture of right femur, initial encounter for closed fracture: Secondary | ICD-10-CM | POA: Diagnosis not present

## 2021-05-21 DIAGNOSIS — I129 Hypertensive chronic kidney disease with stage 1 through stage 4 chronic kidney disease, or unspecified chronic kidney disease: Secondary | ICD-10-CM | POA: Diagnosis not present

## 2021-05-21 DIAGNOSIS — Z789 Other specified health status: Secondary | ICD-10-CM | POA: Diagnosis not present

## 2021-05-21 DIAGNOSIS — J302 Other seasonal allergic rhinitis: Secondary | ICD-10-CM | POA: Diagnosis not present

## 2021-05-21 DIAGNOSIS — S72491A Other fracture of lower end of right femur, initial encounter for closed fracture: Secondary | ICD-10-CM | POA: Diagnosis not present

## 2021-05-21 DIAGNOSIS — E039 Hypothyroidism, unspecified: Secondary | ICD-10-CM | POA: Diagnosis not present

## 2021-05-21 DIAGNOSIS — F29 Unspecified psychosis not due to a substance or known physiological condition: Secondary | ICD-10-CM | POA: Diagnosis not present

## 2021-05-21 DIAGNOSIS — E559 Vitamin D deficiency, unspecified: Secondary | ICD-10-CM | POA: Diagnosis not present

## 2021-05-21 DIAGNOSIS — K5901 Slow transit constipation: Secondary | ICD-10-CM | POA: Diagnosis not present

## 2021-05-21 DIAGNOSIS — S7290XB Unspecified fracture of unspecified femur, initial encounter for open fracture type I or II: Secondary | ICD-10-CM | POA: Diagnosis not present

## 2021-05-21 DIAGNOSIS — I10 Essential (primary) hypertension: Secondary | ICD-10-CM | POA: Diagnosis not present

## 2021-05-21 DIAGNOSIS — Z7901 Long term (current) use of anticoagulants: Secondary | ICD-10-CM | POA: Diagnosis not present

## 2021-05-21 DIAGNOSIS — M79604 Pain in right leg: Secondary | ICD-10-CM | POA: Diagnosis not present

## 2021-05-21 LAB — BASIC METABOLIC PANEL
Anion gap: 5 (ref 5–15)
BUN: 18 mg/dL (ref 8–23)
CO2: 27 mmol/L (ref 22–32)
Calcium: 8.5 mg/dL — ABNORMAL LOW (ref 8.9–10.3)
Chloride: 100 mmol/L (ref 98–111)
Creatinine, Ser: 0.73 mg/dL (ref 0.44–1.00)
GFR, Estimated: >60 mL/min (ref >60)
Glucose, Bld: 91 mg/dL (ref 70–99)
Potassium: 4 mmol/L (ref 3.5–5.1)
Sodium: 132 mmol/L — ABNORMAL LOW (ref 135–145)

## 2021-05-21 LAB — CBC
HCT: 26.9 % — ABNORMAL LOW (ref 36.0–46.0)
Hemoglobin: 8.6 g/dL — ABNORMAL LOW (ref 12.0–15.0)
MCH: 29.5 pg (ref 26.0–34.0)
MCHC: 32 g/dL (ref 30.0–36.0)
MCV: 92.1 fL (ref 80.0–100.0)
Platelets: 225 10*3/uL (ref 150–400)
RBC: 2.92 MIL/uL — ABNORMAL LOW (ref 3.87–5.11)
RDW: 15 % (ref 11.5–15.5)
WBC: 9.2 10*3/uL (ref 4.0–10.5)
nRBC: 0.4 % — ABNORMAL HIGH (ref 0.0–0.2)

## 2021-05-21 LAB — TYPE AND SCREEN
ABO/RH(D): B POS
Antibody Screen: NEGATIVE
Unit division: 0

## 2021-05-21 LAB — BPAM RBC
Blood Product Expiration Date: 202304072359
ISSUE DATE / TIME: 202303242028
Unit Type and Rh: 7300

## 2021-05-21 MED ORDER — OXYCODONE-ACETAMINOPHEN 5-325 MG PO TABS: 1.0000 | ORAL_TABLET | ORAL | 0 refills | Status: DC | PRN

## 2021-05-21 MED ORDER — FOLIC ACID 1 MG PO TABS: 1.0000 mg | ORAL_TABLET | Freq: Every day | ORAL | 0 refills | Status: DC

## 2021-05-21 MED ORDER — POLYETHYLENE GLYCOL 3350 17 G PO PACK: 17.0000 g | PACK | Freq: Two times a day (BID) | ORAL | 0 refills | Status: DC

## 2021-05-21 MED ORDER — SENNOSIDES-DOCUSATE SODIUM 8.6-50 MG PO TABS: 1.0000 | ORAL_TABLET | Freq: Two times a day (BID) | ORAL | 0 refills | Status: DC

## 2021-05-21 MED ORDER — FERROUS SULFATE 325 (65 FE) MG PO TABS: 325.0000 mg | ORAL_TABLET | Freq: Every day | ORAL | 3 refills | Status: DC

## 2021-05-21 MED ORDER — ENOXAPARIN SODIUM 40 MG/0.4ML IJ SOSY: 40.0000 mg | PREFILLED_SYRINGE | INTRAMUSCULAR | 0 refills | Status: DC

## 2021-05-21 MED ORDER — METHOCARBAMOL 500 MG PO TABS: 500.0000 mg | ORAL_TABLET | Freq: Four times a day (QID) | ORAL | 0 refills | Status: DC | PRN

## 2021-05-21 NOTE — Plan of Care (Signed)
  Problem: Education: Goal: Knowledge of General Education information will improve Description: Including pain rating scale, medication(s)/side effects and non-pharmacologic comfort measures Outcome: Progressing   Problem: Activity: Goal: Risk for activity intolerance will decrease Outcome: Progressing   Problem: Pain Managment: Goal: General experience of comfort will improve Outcome: Progressing   

## 2021-05-21 NOTE — Discharge Summary (Signed)
?Physician Discharge Summary ?  ?Patient: Andrea [name] MRN: [id number] DOB: 04/24/1955  ?Admit date:     05/14/2021  ?Discharge date: 05/21/21  ?Discharge Physician: Jerald Kief A Macenzie Burford  ? ?PCP: [name], MD  ? ?Recommendations at discharge:  ? ? Needs CBC in 24-- hours hours.  ?Needs to follow up with Ortho in 1-2 weeks.  ? ?Discharge Diagnoses: ?Principal Problem: ?  Femur fracture, right ([place]) ?Active Problems: ?  Hypothyroidism ?  Hyperlipidemia ?  HYPERTENSION, BENIGN SYSTEMIC ?  Restrictive lung disease ?  Femur fracture ([place]) ? ?Resolved Problems: ?  * No resolved hospital problems. * ? ?Hospital Course: ?66 year old with past medical history significant for hypertension, hyperlipidemia, hypothyroidism, who had a fall while trying to swim, found to have comminuted angulated and displaced right distal femoral fracture.  Orthopedic was consulted and she is status post reduction and retrograde IM fixation of  right distal femoral fracture. ?  ?Hospital course complicated by urinary retention and postoperative acute blood loss anemia. ?  ?Plan to transfer to SNF>  ?  ? ?Assessment and Plan: ?  1-Comminuted, angulated displaced right distal femoral fracture: ?-S/p IMN right distal femur on 3/18 by Dr. Lynann Bologna ?-Post operative pain, DVT prophylaxis per ortho.  ?-Ortho recommended touchdown weightbearing on the right lower extremity, Lovenox for DVT prophylaxis. ?-Awaiting skilled nursing authorization and stabilization of hb.  ?  ?2-Acute urinary retention: ?Foley catheter removed on 3/19, patient voiding spontaneously. ?Resolved. ?  ?3-Acute blood loss anemia, postop:\Hemoglobin 04/1810, has been drifting down to 8.7--8.0. ?Does not appears to be acutely bleeding from incision. ?Orthopedic following. ?Patient denies melena. ?Started  oral iron.  ?Hb down to 7.6 form 7.8 yesterday. Rectal exam negative for melena, occult blood send.  ?Discussed with Dr Starla Link we can consider arterial bleed, he  recommended vascular evaluation. I discussed with Dr Virl Cagey case, he relates drop from 11 to 8.7 likely post op blood loss. From 8.7 down to 7.6 in several days does not correlates with severe arterial bleed. Also patient would have more pain and  decreased pulse and would be more sick. Recommend to hold on getting CT angio of leg for now.  ?-received one unit PRBC 3/24. Hb increase to 8.6 appropriately from 7.6  ?-no evidence of active bleeding. Plan to transfer to SNF after she is evaluated by Ortho today. Needs hb check in 24 hours.  ?  ?Hypertension: Continue with Coreg. ?Holding amlodipine and spironolactone, SBP well controlled.  ?  ?Hyperlipidemia: Continue with the statins. ?  ?History restrictive lung disease: Continue with inhaler. ?  ?Hypothyroidism: Continue with Synthroid. ?  ?CKD stage II: ?Continue to monitor ?  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  Need lifestyle modification ?  ?Constipation; started  laxatives. Plan for suppository  ?  ? ? ?  ? ? ?Consultants: Dr Starla Link ?Procedures performed: Right distal femur fracture.  ?Disposition: Skilled nursing facility ?Diet recommendation:  ?Discharge Diet Orders (From admission, onward)  ? ?  Start     Ordered  ? 05/21/21 0000  Diet - low sodium heart healthy       ? 05/21/21 0847  ? ?  ?  ? ?  ? ?Cardiac diet ?DISCHARGE MEDICATION: ?Allergies as of 05/21/2021   ?No Known Allergies ?  ? ?  ?Medication List  ?  ? ?STOP taking these medications   ? ?amLODipine 5 MG tablet ?Commonly known as: NORVASC ?  ?Blood Pressure Monitor/L Cuff Misc ?  ?fluticasone 50 MCG/ACT nasal spray ?  Commonly known as: FLONASE ?  ?spironolactone 50 MG tablet ?Commonly known as: ALDACTONE ?  ? ?  ? ?TAKE these medications   ? ?Advair HFA 115-21 MCG/ACT inhaler ?Generic drug: fluticasone-salmeterol ?Inhale 2 puffs into the lungs 2 (two) times daily. ?  ?albuterol 108 (90 Base) MCG/ACT inhaler ?Commonly known as: VENTOLIN HFA ?Inhale 2 puffs into the lungs every 6 (six) hours as needed for  wheezing or shortness of breath. ?  ?carvedilol 6.25 MG tablet ?Commonly known as: COREG ?TAKE 1 TABLET BY MOUTH TWICE DAILY WITH A MEAL ?  ?enoxaparin 40 MG/0.4ML injection ?Commonly known as: LOVENOX ?Inject 0.4 mLs (40 mg total) into the skin daily. ?  ?famotidine 20 MG tablet ?Commonly known as: PEPCID ?Take 1 tablet (20 mg total) by mouth daily. ?  ?ferrous sulfate 325 (65 FE) MG tablet ?Take 1 tablet (325 mg total) by mouth daily with breakfast. ?Start taking on: May 22, 2021 ?  ?Fish Oil 1000 MG Caps ?Take 1 capsule (1,000 mg total) by mouth in the morning and at bedtime. ?What changed: when to take this ?  ?folic acid 1 MG tablet ?Commonly known as: FOLVITE ?Take 1 tablet (1 mg total) by mouth daily. ?  ?levothyroxine 125 MCG tablet ?Commonly known as: SYNTHROID ?TAKE 1 TABLET BY MOUTH ONCE DAILY BEFORE BREAKFAST ?  ?loratadine 10 MG tablet ?Commonly known as: CLARITIN ?Take 1 tablet (10 mg total) by mouth daily. ?  ?methocarbamol 500 MG tablet ?Commonly known as: Robaxin ?Take 1 tablet (500 mg total) by mouth every 6 (six) hours as needed for muscle spasms. ?  ?oxyCODONE-acetaminophen 5-325 MG tablet ?Commonly known as: Percocet ?Take 1 tablet by mouth every 4 (four) hours as needed for severe pain. ?  ?polyethylene glycol 17 g packet ?Commonly known as: MIRALAX / GLYCOLAX ?Take 17 g by mouth 2 (two) times daily. ?  ?senna-docusate 8.6-50 MG tablet ?Commonly known as: Senokot-S ?Take 1 tablet by mouth 2 (two) times daily. ?  ?sertraline 50 MG tablet ?Commonly known as: ZOLOFT ?Take 2 tablets (100 mg total) by mouth daily. ?  ?simvastatin 40 MG tablet ?Commonly known as: ZOCOR ?TAKE 1 TABLET BY MOUTH AT BEDTIME ?What changed: when to take this ?  ?vitamin D3 50 MCG (2000 UT) Caps ?Take 2,000 Units by mouth daily. ?  ? ?  ? ? Contact information for after-discharge care   ? ? Destination   ? ? [facility] Preferred SNF .   ?Service: Skilled Nursing ?Contact information: ?[address] ?[place] ?[phone] ? ?  ?  ? ?  ?  ? ?  ?  ? ?  ? ?Discharge Exam: ?Filed Weights  ? 05/14/21 1400  ?Weight: 120.7 kg  ?General; NAD ?Lungs; CTA ?CVS; S 1, S 2 RRR ?Extremity right LE; mild edema surgical site ? ?Condition at discharge: stable ? ?The results of significant diagnostics from this hospitalization (including imaging, microbiology, ancillary and laboratory) are listed below for reference.  ? ?Imaging Studies: ?DG Knee 1-2 Views Right ? ?Result Date: 05/14/2021 ?CLINICAL DATA:  Pain and swelling after injury. EXAM: RIGHT KNEE - 1-2 VIEW COMPARISON:  None. FINDINGS: There is a comminuted displaced fracture through the distal femoral diaphysis, incompletely evaluated on frontal imaging. No other fractures are identified. No joint effusion. Degenerative changes. IMPRESSION: Comminuted displaced fracture through the distal femoral diaphysis, incompletely evaluated on frontal imaging. Recommend dedicated images of the femur. Electronically Signed   By: Dorise Bullion III M.D.  On: 05/14/2021 14:42  ? ?DG C-Arm 1-60 Min-No Report ? ?Result Date: 05/14/2021 ?Fluoroscopy was utilized by the requesting physician.  No radiographic interpretation.  ? ?DG C-Arm 1-60 Min-No Report ? ?Result Date: 05/14/2021 ?Fluoroscopy was utilized by the requesting physician.  No radiographic interpretation.  ? ?DG FEMUR, MIN 2 VIEWS RIGHT ? ?Result Date: 05/14/2021 ?CLINICAL DATA:  Intraoperative right intramedullary nail. EXAM: RIGHT FEMUR 2 VIEWS COMPARISON:  Right femur x-ray 05/14/2021. FINDINGS: Intraoperative right femur. 6 low resolution intraoperative spot views of the right femur were obtained. Right femoral intramedullary nail was placed fixating a comminuted mid femoral fracture. Alignment is anatomic. Total fluoroscopy time: 2 minutes 54 seconds IMPRESSION: Intraoperative right femoral intramedullary nail. Electronically Signed   By: Ronney Asters M.D.   On: 05/14/2021 21:16  ? ?DG Femur  Min 2 Views Right ? ?Result Date: 05/14/2021 ?CLINICAL DATA:  Trauma, pain EXAM: RIGHT FEMUR 2 VIEWS COMPARISON:  None. FINDINGS: There is comminuted fracture in the distal shaft of right femur. There is medial a

## 2021-05-21 NOTE — Progress Notes (Signed)
? ? ? ?  Andrea [name] is a 66 y.o. female  ? ?Orthopaedic diagnosis: Status post right femur fracture with retrograde nail fixation 05/14/2021 ? ?Subjective: ?Patient appears comfortable in bed.  She has been eating and drinking without issue.  She is looking forward to being discharged to a SNF.  She reports her pain is well controlled.  She has been experiencing postoperative anemia, and did receive 1 unit of packed RBC yesterday.  Hemoglobin improved from 7.6 to 8.6 overnight.  She endorses a history of iron deficiency anemia.  She denies any significant dizziness, lightheadedness, fatigue, or shortness of breath.  She has not experienced any significant bleeding from her surgical dressings.  Denies nausea or vomiting. ? ?Objectyive: ?Vitals:  ? 05/21/21 0530 05/21/21 0808  ?BP: (!) 141/89 122/90  ?Pulse: 78 76  ?Resp:    ?Temp: 98.9 ?F (37.2 ?C) 98.5 ?F (36.9 ?C)  ?SpO2: 98% 95%  ?  ? ?Exam: ?Awake and alert ?Respirations even and unlabored ?No acute distress ? ?Examination the right lower extremity demonstrates intact dressings.  They are clean and dry and there is no bleeding.  No significant tenderness to palpation throughout.  Swelling appropriate.  Ace wrap in place at the knee.  Calf is soft and nontender.  Compartments are soft.  Intact active range of motion at the ankle.  Palpable pedal pulses.  The right lower extremity is warm and well-perfused distally.  Sensation is grossly intact.  Exposed skin is benign throughout. ? ?Assessment: ?Postop day 7 status post the above, overall doing well and suitable for discharge to SNF from orthopedic standpoint ?Postoperative anemia, hemoglobin improved overnight ? ? ?Plan: ?Patient is suitable for discharge to SNF from an orthopedic standpoint.  Hemoglobin appears to be improving.  She does not appear to be overtly symptomatic in terms of anemia.  Recommend following CBC at SNF.  Discussed worsening symptoms.  Discussed postoperative plan with the patient.   All questions were answered. ? ?-Touchdown weightbearing right lower extremity x 6 weeks with use of walker or crutches.  Wheelchair for transportation if needed.  Encouraged ambulation when possible and safe.  Continue PT/OT at SNF. ?-Dressing changes when appropriate.  Ace wrap at the knee. ?-Percocet for pain control, Robaxin for spasms, Lovenox x1 month for DVT prophylaxis.  All 3 prescriptions are printed and signed and in the patient's chart. ?-Follow-up with Dr. Lynann Bologna at [facility] [phone]) 2 weeks from surgery for reevaluation.  Call the office with concerns in the interim. ? ? ?Andrea [name] J. Martin[place], PA-C ? ? ?

## 2021-05-21 NOTE — TOC Transition Note (Signed)
Transition of Care (TOC) - CM/SW Discharge Note ? ? ?Patient Details  ?Name: Andrea [name] ?MRN: [id number] ?Date of Birth: 07-15-1955 ? ?Transition of Care (TOC) CM/SW Contact:  ?Ross Ludwig, LCSW ?Phone Number: ?05/21/2021, 1:46 PM ? ? ?Clinical Narrative:    ? ?CSW spoke to [employer] at [facility].  She said patient can discharge today.  CSW updated bedside nurse and patient's sister Andrea [name].  Patient to be d/c'ed today to [facility].  Patient and family agreeable to plans will transport via ems RN to call report to [phone].  Patient's sister Andrea [name] would like to be called once EMS arrives.  Her number is [phone]. ? ? ? ?Final next level of care: [place] ?Barriers to Discharge: Barriers Resolved ? ? ?Patient Goals and CMS Choice ?Patient states their goals for this hospitalization and ongoing recovery are:: To go to SNF for short term rehab, then return back home. ?CMS Medicare.gov Compare Post Acute Care list provided to:: Patient Represenative (must comment) ?Choice offered to / list presented to : Adult Children ? ?Discharge Placement ?PASRR number recieved: 05/20/21 ?           ?Patient chooses bed at: [facility] ?Patient to be transferred to facility by: [facility] ?Name of family member notified: Patient's sister Andrea [name] ?Patient and family notified of of transfer: 05/21/21 ? ?Discharge Plan and Services ?  ?  ?           ?  ?  ?  ?  ?  ?  ?  ?  ?  ?  ? ?Social Determinants of Health (SDOH) Interventions ?  ? ? ?Readmission Risk Interventions ?   ? View : No data to display.  ?  ?  ?  ? ? ? ? ? ?

## 2021-05-23 DIAGNOSIS — K5901 Slow transit constipation: Secondary | ICD-10-CM | POA: Diagnosis not present

## 2021-05-23 DIAGNOSIS — I129 Hypertensive chronic kidney disease with stage 1 through stage 4 chronic kidney disease, or unspecified chronic kidney disease: Secondary | ICD-10-CM | POA: Diagnosis not present

## 2021-05-23 DIAGNOSIS — Z789 Other specified health status: Secondary | ICD-10-CM | POA: Diagnosis not present

## 2021-05-23 DIAGNOSIS — Z7901 Long term (current) use of anticoagulants: Secondary | ICD-10-CM | POA: Diagnosis not present

## 2021-05-23 DIAGNOSIS — E039 Hypothyroidism, unspecified: Secondary | ICD-10-CM | POA: Diagnosis not present

## 2021-05-23 DIAGNOSIS — E785 Hyperlipidemia, unspecified: Secondary | ICD-10-CM | POA: Diagnosis not present

## 2021-05-23 DIAGNOSIS — J984 Other disorders of lung: Secondary | ICD-10-CM | POA: Diagnosis not present

## 2021-05-23 DIAGNOSIS — D62 Acute posthemorrhagic anemia: Secondary | ICD-10-CM | POA: Diagnosis not present

## 2021-05-23 DIAGNOSIS — S7291XA Unspecified fracture of right femur, initial encounter for closed fracture: Secondary | ICD-10-CM | POA: Diagnosis not present

## 2021-05-24 ENCOUNTER — Telehealth: Payer: Medicare HMO

## 2021-05-26 DIAGNOSIS — Z9889 Other specified postprocedural states: Secondary | ICD-10-CM | POA: Diagnosis not present

## 2021-05-26 DIAGNOSIS — M79604 Pain in right leg: Secondary | ICD-10-CM | POA: Diagnosis not present

## 2021-06-02 DIAGNOSIS — R69 Illness, unspecified: Secondary | ICD-10-CM | POA: Diagnosis not present

## 2021-06-06 DIAGNOSIS — S72001D Fracture of unspecified part of neck of right femur, subsequent encounter for closed fracture with routine healing: Secondary | ICD-10-CM | POA: Diagnosis not present

## 2021-06-06 DIAGNOSIS — D649 Anemia, unspecified: Secondary | ICD-10-CM | POA: Diagnosis not present

## 2021-06-06 DIAGNOSIS — I150 Renovascular hypertension: Secondary | ICD-10-CM | POA: Diagnosis not present

## 2021-06-10 DIAGNOSIS — M6281 Muscle weakness (generalized): Secondary | ICD-10-CM | POA: Diagnosis not present

## 2021-06-10 DIAGNOSIS — S7291XB Unspecified fracture of right femur, initial encounter for open fracture type I or II: Secondary | ICD-10-CM | POA: Diagnosis not present

## 2021-06-10 DIAGNOSIS — E039 Hypothyroidism, unspecified: Secondary | ICD-10-CM | POA: Diagnosis not present

## 2021-06-10 DIAGNOSIS — R5381 Other malaise: Secondary | ICD-10-CM | POA: Diagnosis not present

## 2021-06-10 DIAGNOSIS — N182 Chronic kidney disease, stage 2 (mild): Secondary | ICD-10-CM | POA: Diagnosis not present

## 2021-06-10 DIAGNOSIS — E785 Hyperlipidemia, unspecified: Secondary | ICD-10-CM | POA: Diagnosis not present

## 2021-06-10 DIAGNOSIS — S72001D Fracture of unspecified part of neck of right femur, subsequent encounter for closed fracture with routine healing: Secondary | ICD-10-CM | POA: Diagnosis not present

## 2021-06-10 DIAGNOSIS — D62 Acute posthemorrhagic anemia: Secondary | ICD-10-CM | POA: Diagnosis not present

## 2021-06-10 DIAGNOSIS — R69 Illness, unspecified: Secondary | ICD-10-CM | POA: Diagnosis not present

## 2021-06-10 DIAGNOSIS — J984 Other disorders of lung: Secondary | ICD-10-CM | POA: Diagnosis not present

## 2021-06-10 DIAGNOSIS — Z7901 Long term (current) use of anticoagulants: Secondary | ICD-10-CM | POA: Diagnosis not present

## 2021-06-10 DIAGNOSIS — I129 Hypertensive chronic kidney disease with stage 1 through stage 4 chronic kidney disease, or unspecified chronic kidney disease: Secondary | ICD-10-CM | POA: Diagnosis not present

## 2021-06-10 DIAGNOSIS — F3342 Major depressive disorder, recurrent, in full remission: Secondary | ICD-10-CM | POA: Diagnosis not present

## 2021-06-14 ENCOUNTER — Ambulatory Visit: Payer: Medicare HMO

## 2021-06-14 DIAGNOSIS — E039 Hypothyroidism, unspecified: Secondary | ICD-10-CM | POA: Diagnosis not present

## 2021-06-14 DIAGNOSIS — Z9181 History of falling: Secondary | ICD-10-CM | POA: Diagnosis not present

## 2021-06-14 DIAGNOSIS — I129 Hypertensive chronic kidney disease with stage 1 through stage 4 chronic kidney disease, or unspecified chronic kidney disease: Secondary | ICD-10-CM | POA: Diagnosis not present

## 2021-06-14 DIAGNOSIS — N182 Chronic kidney disease, stage 2 (mild): Secondary | ICD-10-CM | POA: Diagnosis not present

## 2021-06-14 DIAGNOSIS — S7291XA Unspecified fracture of right femur, initial encounter for closed fracture: Secondary | ICD-10-CM | POA: Diagnosis not present

## 2021-06-14 DIAGNOSIS — J984 Other disorders of lung: Secondary | ICD-10-CM | POA: Diagnosis not present

## 2021-06-14 DIAGNOSIS — Z6841 Body Mass Index (BMI) 40.0 and over, adult: Secondary | ICD-10-CM | POA: Diagnosis not present

## 2021-06-14 DIAGNOSIS — S72401D Unspecified fracture of lower end of right femur, subsequent encounter for closed fracture with routine healing: Secondary | ICD-10-CM | POA: Diagnosis not present

## 2021-06-14 DIAGNOSIS — W19XXXD Unspecified fall, subsequent encounter: Secondary | ICD-10-CM | POA: Diagnosis not present

## 2021-06-14 NOTE — Chronic Care Management (AMB) (Signed)
? ?  RN Case Manager ?Care Management  ? Phone Outreach  ? ? ?06/14/2021 ?Name: Andrea [name] MRN: [id number] DOB: 1956/02/12 ? ?Andrea [name] is a 66 y.o. year old female who is a primary care patient of [name], MD .  ? ?Unable to keep phone appointment today and requested to reschedule. ? ?Follow Up Plan: Appointment was rescheduled with CCM RN on 06/15/21 at 11 am   ? ?Review of patient status, including review of consultants reports, relevant laboratory and other test results, and collaboration with appropriate care team members and the patient's provider was performed as part of comprehensive patient evaluation and provision of care management services.   ? ?Lazaro Arms RN, BSN, [facility] ?Care Management Coordinator ?[place] ?Phone: [phone] Fax: [phone] ?  ? ? ? ? ? ? ? ? ? ? ? ?

## 2021-06-15 ENCOUNTER — Telehealth: Payer: Self-pay

## 2021-06-15 ENCOUNTER — Ambulatory Visit: Payer: Medicare HMO

## 2021-06-15 NOTE — Telephone Encounter (Signed)
[facility] PT requests verbal orders for home health PT as follows.  ? ?2x a week for 3 weeks ?1x a week for 3 weeks  ? ?Verbal order given.  ?

## 2021-06-15 NOTE — Chronic Care Management (AMB) (Signed)
?Chronic Care Management  ? ?CCM RN Visit Note ? ?06/15/2021 ?Name: Andrea [name] MRN: [id number] DOB: 03-05-1955 ? ?Subjective: ?Andrea [name] is a 66 y.o. year old female who is a primary care patient of [name], [name], MD. The care management team was consulted for assistance with disease management and care coordination needs.   ? ?Engaged with patient by telephone for follow up visit in response to provider referral for case management and/or care coordination services.  ? ?Consent to Services:  ?The patient was given information about Chronic Care Management services, agreed to services, and gave verbal consent prior to initiation of services.  Please see initial visit note for detailed documentation.  ? ?Patient agreed to services and verbal consent obtained.  ? ? ?Summary: Patient is making progress with her blood pressure , but currently is experiencing difficulty with with her right leg that she fractured.  She is currently doing physical therapy . See Care Plan below for interventions and patient self-care actives. ? ?Recommendation: The patient may benefit from taking medications as prescribed, exercising as tolerated, checking blood pressures and record values, go to all appointments. and The patient agrees. ? ?Follow up Plan: Patient would like continued follow-up.  CCM RNCM will outreach the patient within the next 5 weeks.  Patient will call office if needed prior to next encounter ? ? ?Assessment: Review of patient past medical history, allergies, medications, health status, including review of consultants reports, laboratory and other test data, was performed as part of comprehensive evaluation and provision of chronic care management services.  ? ?SDOH (Social Determinants of Health) assessments and interventions performed:   ? ?CCM Care Plan ? ?Conditions to be addressed/monitored:HTN ? ?Care Plan : RN Case Management  ?Updates made by Lazaro Arms, RN since 06/15/2021 12:00 AM  ?   ? ?Problem: Hypertension Management   ?Priority: High  ?Onset Date: 01/06/2019  ?Note:   ?Current Barriers:  ?Knowledge Deficits related to basic understanding of hypertension pathophysiology and self-care management ?  ?Nurse Case Manager Clinical Goal(s):  ?  ?Over the next 90 days, patient will demonstrate improved adherence to prescribed treatment plan for hypertension as evidenced by taking all medications as prescribed, monitoring and recording blood pressure as directed, adhering to low sodium/DASH diet ? ?Interventions: ?1:1 collaboration with primary care provider regarding development and update of comprehensive plan of care as evidenced by provider attestation and co-signature ?Inter-disciplinary care team collaboration (see longitudinal plan of care) ?Evaluation of current treatment plan related to  self management and patient's adherence to plan as established by provider ? ? ?Hypertension: (Status: Goal on track: NO.) [place] ?Last practice recorded BP readings:  ?BP Readings from Last 3 Encounters:  ?05/21/21 122/90  ?04/22/21 (!) 168/113  ?04/08/21 (!) 136/108  ?Most recent eGFR/CrCl:  ?Lab Results  ?Component Value Date  ? EGFR 62 04/27/2021  ?  No components found for: CRCL ?  ?Evaluation of current treatment plan related to hypertension self-management and patient's adherence to plan as established by provider. ?Provided education to patient re: stroke prevention, s/s of heart attack and stroke, DASH diet, complications of uncontrolled blood pressure ?Reviewed medications with patient and discussed importance of compliance ?Discussed plans with patient for ongoing care management follow up and provided patient with direct contact information for care management team ?Advised patient, providing education and rationale, to monitor blood pressure daily and record, calling PCP for findings outside established parameters. -  ?healthy diet promoted -still monitor sodium in her food  choices,   ?medication adherence assessment completed patient is taking her medications as prescribed  ?support and encouragement provided-  ?06/15/21: The patient returns home from rehab on 06/10/21 and is in a wheelchair. She is unable to put any pressure on her right leg. Currently, a Physical therapist is coming to the home. She continued to check her blood pressure at home, which was 120/80. She has an appointment with Dr. [name] scheduled for  06/21/21 and Ortho on April 28th or 29th; she is still determining the date but hopes to find out if she can start walking on her leg. ? ? ?Patient Goals/Self Care Activities: ?-Patient/Caregiver will self-administer medications as prescribed as evidenced by self-report/primary caregiver report  ?-Patient/Caregiver will attend all scheduled provider appointments as evidenced by clinician review of documented attendance to scheduled appointments and patient/caregiver report ?-Patient/Caregiver will call pharmacy for medication refills as evidenced by patient report and review of pharmacy fill history as appropriate ?-Patient/Caregiver will call provider office for new concerns or questions as evidenced by review of documented incoming telephone call notes and patient report ?-Patient/Caregiver verbalizes understanding of plan ?-Patient/Caregiver will focus on medication adherence by taking medications as prescribed ?-Calls provider office for new concerns, questions, or BP outside discussed parameters ?-Checks BP and records as discussed ?-Follows a low sodium diet/DASH diet ?-Patient will continue to cook healthy meals at home ?-Patient will follow up with the gym she has chosen ? ? ?  ? ?Lazaro Arms RN, BSN, [place] ?Care Management Coordinator ?[place]  ?Phone: [phone]  ?  ? ? ? ? ? ? ? ?

## 2021-06-16 ENCOUNTER — Other Ambulatory Visit: Payer: Medicare HMO

## 2021-06-16 NOTE — Patient Instructions (Signed)
Visit Information ? ?Ms. Munce  it was nice speaking with you. Please call me directly [phone] if you have questions about the goals we discussed. ? ?  ?Patient Goals/Self Care Activities: ?-Patient/Caregiver will self-administer medications as prescribed as evidenced by self-report/primary caregiver report  ?-Patient/Caregiver will attend all scheduled provider appointments as evidenced by clinician review of documented attendance to scheduled appointments and patient/caregiver report ?-Patient/Caregiver will call pharmacy for medication refills as evidenced by patient report and review of pharmacy fill history as appropriate ?-Patient/Caregiver will call provider office for new concerns or questions as evidenced by review of documented incoming telephone call notes and patient report ?-Patient/Caregiver verbalizes understanding of plan ?-Patient/Caregiver will focus on medication adherence by taking medications as prescribed ?-Calls provider office for new concerns, questions, or BP outside discussed parameters ?-Checks BP and records as discussed ?-Follows a low sodium diet/DASH diet ?-Patient will continue to cook healthy meals at home ?-Patient will follow up with the gym she has chosen ?  ? ? ?Patient verbalizes understanding of instructions and care plan provided today and agrees to view in [place]. Active MyChart status confirmed with patient.   ? ?Follow up Plan: Patient would like continued follow-up.  CCM RNCM will outreach the patient within the next 5 weeks.  Patient will call office if needed prior to next encounter ? ?Lazaro Arms, RN ? ?[phone]  ?

## 2021-06-17 DIAGNOSIS — Z6841 Body Mass Index (BMI) 40.0 and over, adult: Secondary | ICD-10-CM | POA: Diagnosis not present

## 2021-06-17 DIAGNOSIS — I129 Hypertensive chronic kidney disease with stage 1 through stage 4 chronic kidney disease, or unspecified chronic kidney disease: Secondary | ICD-10-CM | POA: Diagnosis not present

## 2021-06-17 DIAGNOSIS — N182 Chronic kidney disease, stage 2 (mild): Secondary | ICD-10-CM | POA: Diagnosis not present

## 2021-06-17 DIAGNOSIS — E039 Hypothyroidism, unspecified: Secondary | ICD-10-CM | POA: Diagnosis not present

## 2021-06-17 DIAGNOSIS — W19XXXD Unspecified fall, subsequent encounter: Secondary | ICD-10-CM | POA: Diagnosis not present

## 2021-06-17 DIAGNOSIS — Z9181 History of falling: Secondary | ICD-10-CM | POA: Diagnosis not present

## 2021-06-17 DIAGNOSIS — J984 Other disorders of lung: Secondary | ICD-10-CM | POA: Diagnosis not present

## 2021-06-17 DIAGNOSIS — S72401D Unspecified fracture of lower end of right femur, subsequent encounter for closed fracture with routine healing: Secondary | ICD-10-CM | POA: Diagnosis not present

## 2021-06-20 DIAGNOSIS — J984 Other disorders of lung: Secondary | ICD-10-CM | POA: Diagnosis not present

## 2021-06-20 DIAGNOSIS — Z6841 Body Mass Index (BMI) 40.0 and over, adult: Secondary | ICD-10-CM | POA: Diagnosis not present

## 2021-06-20 DIAGNOSIS — I129 Hypertensive chronic kidney disease with stage 1 through stage 4 chronic kidney disease, or unspecified chronic kidney disease: Secondary | ICD-10-CM | POA: Diagnosis not present

## 2021-06-20 DIAGNOSIS — Z9181 History of falling: Secondary | ICD-10-CM | POA: Diagnosis not present

## 2021-06-20 DIAGNOSIS — W19XXXD Unspecified fall, subsequent encounter: Secondary | ICD-10-CM | POA: Diagnosis not present

## 2021-06-20 DIAGNOSIS — N182 Chronic kidney disease, stage 2 (mild): Secondary | ICD-10-CM | POA: Diagnosis not present

## 2021-06-20 DIAGNOSIS — E039 Hypothyroidism, unspecified: Secondary | ICD-10-CM | POA: Diagnosis not present

## 2021-06-20 DIAGNOSIS — S72401D Unspecified fracture of lower end of right femur, subsequent encounter for closed fracture with routine healing: Secondary | ICD-10-CM | POA: Diagnosis not present

## 2021-06-21 ENCOUNTER — Telehealth: Payer: Self-pay

## 2021-06-21 ENCOUNTER — Ambulatory Visit: Payer: Medicare HMO | Admitting: Family Medicine

## 2021-06-21 NOTE — Telephone Encounter (Signed)
Andrea [name] LVM on nurse line reporting abnormal BP yesterday 4/24. ? ?Andrea [name] reports elevated BP of 154/106. Patient reports the patient was asymptomatic and just finished eating a bag of chips.  ? ?I called patient to check on her. Patient reports she checked her BP this morning and 149/97. Patient reports she has not taken her BP medication yet.  ? ?Patient denies any headaches, vision changes, chest pains or SOB.  ? ?Patient advised to take her medication as prescribed and to continue to monitor her home BPs. ? ?Patient has PCP apt scheduled for 5/16. ?

## 2021-06-21 NOTE — Telephone Encounter (Signed)
[place] calls nurse line requesting verbal orders for social work evaluation.  ? ?Verbal order given.  ?

## 2021-06-22 DIAGNOSIS — Z9181 History of falling: Secondary | ICD-10-CM | POA: Diagnosis not present

## 2021-06-22 DIAGNOSIS — W19XXXD Unspecified fall, subsequent encounter: Secondary | ICD-10-CM | POA: Diagnosis not present

## 2021-06-22 DIAGNOSIS — Z6841 Body Mass Index (BMI) 40.0 and over, adult: Secondary | ICD-10-CM | POA: Diagnosis not present

## 2021-06-22 DIAGNOSIS — I129 Hypertensive chronic kidney disease with stage 1 through stage 4 chronic kidney disease, or unspecified chronic kidney disease: Secondary | ICD-10-CM | POA: Diagnosis not present

## 2021-06-22 DIAGNOSIS — S72401D Unspecified fracture of lower end of right femur, subsequent encounter for closed fracture with routine healing: Secondary | ICD-10-CM | POA: Diagnosis not present

## 2021-06-22 DIAGNOSIS — E039 Hypothyroidism, unspecified: Secondary | ICD-10-CM | POA: Diagnosis not present

## 2021-06-22 DIAGNOSIS — J984 Other disorders of lung: Secondary | ICD-10-CM | POA: Diagnosis not present

## 2021-06-22 DIAGNOSIS — N182 Chronic kidney disease, stage 2 (mild): Secondary | ICD-10-CM | POA: Diagnosis not present

## 2021-06-24 DIAGNOSIS — Z9889 Other specified postprocedural states: Secondary | ICD-10-CM | POA: Diagnosis not present

## 2021-06-24 DIAGNOSIS — M79604 Pain in right leg: Secondary | ICD-10-CM | POA: Diagnosis not present

## 2021-06-27 DIAGNOSIS — I129 Hypertensive chronic kidney disease with stage 1 through stage 4 chronic kidney disease, or unspecified chronic kidney disease: Secondary | ICD-10-CM | POA: Diagnosis not present

## 2021-06-27 DIAGNOSIS — W19XXXD Unspecified fall, subsequent encounter: Secondary | ICD-10-CM | POA: Diagnosis not present

## 2021-06-27 DIAGNOSIS — J984 Other disorders of lung: Secondary | ICD-10-CM | POA: Diagnosis not present

## 2021-06-27 DIAGNOSIS — S72401D Unspecified fracture of lower end of right femur, subsequent encounter for closed fracture with routine healing: Secondary | ICD-10-CM | POA: Diagnosis not present

## 2021-06-27 DIAGNOSIS — N182 Chronic kidney disease, stage 2 (mild): Secondary | ICD-10-CM | POA: Diagnosis not present

## 2021-06-27 DIAGNOSIS — E039 Hypothyroidism, unspecified: Secondary | ICD-10-CM | POA: Diagnosis not present

## 2021-06-27 DIAGNOSIS — Z9181 History of falling: Secondary | ICD-10-CM | POA: Diagnosis not present

## 2021-06-27 DIAGNOSIS — Z6841 Body Mass Index (BMI) 40.0 and over, adult: Secondary | ICD-10-CM | POA: Diagnosis not present

## 2021-06-29 DIAGNOSIS — Z6841 Body Mass Index (BMI) 40.0 and over, adult: Secondary | ICD-10-CM | POA: Diagnosis not present

## 2021-06-29 DIAGNOSIS — N182 Chronic kidney disease, stage 2 (mild): Secondary | ICD-10-CM | POA: Diagnosis not present

## 2021-06-29 DIAGNOSIS — E039 Hypothyroidism, unspecified: Secondary | ICD-10-CM | POA: Diagnosis not present

## 2021-06-29 DIAGNOSIS — I129 Hypertensive chronic kidney disease with stage 1 through stage 4 chronic kidney disease, or unspecified chronic kidney disease: Secondary | ICD-10-CM | POA: Diagnosis not present

## 2021-06-29 DIAGNOSIS — S7291XA Unspecified fracture of right femur, initial encounter for closed fracture: Secondary | ICD-10-CM | POA: Diagnosis not present

## 2021-06-29 DIAGNOSIS — S72401D Unspecified fracture of lower end of right femur, subsequent encounter for closed fracture with routine healing: Secondary | ICD-10-CM | POA: Diagnosis not present

## 2021-06-29 DIAGNOSIS — J984 Other disorders of lung: Secondary | ICD-10-CM | POA: Diagnosis not present

## 2021-06-29 DIAGNOSIS — Z9181 History of falling: Secondary | ICD-10-CM | POA: Diagnosis not present

## 2021-06-29 DIAGNOSIS — W19XXXD Unspecified fall, subsequent encounter: Secondary | ICD-10-CM | POA: Diagnosis not present

## 2021-06-30 DIAGNOSIS — S72401D Unspecified fracture of lower end of right femur, subsequent encounter for closed fracture with routine healing: Secondary | ICD-10-CM | POA: Diagnosis not present

## 2021-06-30 DIAGNOSIS — K219 Gastro-esophageal reflux disease without esophagitis: Secondary | ICD-10-CM | POA: Diagnosis not present

## 2021-06-30 DIAGNOSIS — Z9181 History of falling: Secondary | ICD-10-CM | POA: Diagnosis not present

## 2021-06-30 DIAGNOSIS — J984 Other disorders of lung: Secondary | ICD-10-CM | POA: Diagnosis not present

## 2021-06-30 DIAGNOSIS — Z7409 Other reduced mobility: Secondary | ICD-10-CM | POA: Diagnosis not present

## 2021-06-30 DIAGNOSIS — I509 Heart failure, unspecified: Secondary | ICD-10-CM | POA: Diagnosis not present

## 2021-06-30 DIAGNOSIS — Z604 Social exclusion and rejection: Secondary | ICD-10-CM | POA: Diagnosis not present

## 2021-06-30 DIAGNOSIS — I110 Hypertensive heart disease with heart failure: Secondary | ICD-10-CM | POA: Diagnosis not present

## 2021-06-30 DIAGNOSIS — E261 Secondary hyperaldosteronism: Secondary | ICD-10-CM | POA: Diagnosis not present

## 2021-06-30 DIAGNOSIS — E039 Hypothyroidism, unspecified: Secondary | ICD-10-CM | POA: Diagnosis not present

## 2021-06-30 DIAGNOSIS — R69 Illness, unspecified: Secondary | ICD-10-CM | POA: Diagnosis not present

## 2021-06-30 DIAGNOSIS — Z6841 Body Mass Index (BMI) 40.0 and over, adult: Secondary | ICD-10-CM | POA: Diagnosis not present

## 2021-06-30 DIAGNOSIS — I129 Hypertensive chronic kidney disease with stage 1 through stage 4 chronic kidney disease, or unspecified chronic kidney disease: Secondary | ICD-10-CM | POA: Diagnosis not present

## 2021-06-30 DIAGNOSIS — J45909 Unspecified asthma, uncomplicated: Secondary | ICD-10-CM | POA: Diagnosis not present

## 2021-06-30 DIAGNOSIS — N182 Chronic kidney disease, stage 2 (mild): Secondary | ICD-10-CM | POA: Diagnosis not present

## 2021-06-30 DIAGNOSIS — W19XXXD Unspecified fall, subsequent encounter: Secondary | ICD-10-CM | POA: Diagnosis not present

## 2021-06-30 DIAGNOSIS — E785 Hyperlipidemia, unspecified: Secondary | ICD-10-CM | POA: Diagnosis not present

## 2021-07-05 ENCOUNTER — Telehealth: Payer: Self-pay | Admitting: *Deleted

## 2021-07-05 DIAGNOSIS — S72401D Unspecified fracture of lower end of right femur, subsequent encounter for closed fracture with routine healing: Secondary | ICD-10-CM | POA: Diagnosis not present

## 2021-07-05 DIAGNOSIS — I129 Hypertensive chronic kidney disease with stage 1 through stage 4 chronic kidney disease, or unspecified chronic kidney disease: Secondary | ICD-10-CM | POA: Diagnosis not present

## 2021-07-05 DIAGNOSIS — J984 Other disorders of lung: Secondary | ICD-10-CM | POA: Diagnosis not present

## 2021-07-05 DIAGNOSIS — E039 Hypothyroidism, unspecified: Secondary | ICD-10-CM | POA: Diagnosis not present

## 2021-07-05 DIAGNOSIS — Z9181 History of falling: Secondary | ICD-10-CM | POA: Diagnosis not present

## 2021-07-05 DIAGNOSIS — Z6841 Body Mass Index (BMI) 40.0 and over, adult: Secondary | ICD-10-CM | POA: Diagnosis not present

## 2021-07-05 DIAGNOSIS — W19XXXD Unspecified fall, subsequent encounter: Secondary | ICD-10-CM | POA: Diagnosis not present

## 2021-07-05 DIAGNOSIS — N182 Chronic kidney disease, stage 2 (mild): Secondary | ICD-10-CM | POA: Diagnosis not present

## 2021-07-05 NOTE — Telephone Encounter (Signed)
Received a call from [facility] and they are requesting PCS forms to be completed for patient.  I explained to home health RN that we don't have medicaid on file for her.  She will reach out to patient to provide [place] with that information so we can proceed with processing the paperwork.   ? ?Will print correct form and forward to MD once we get the medicaid#. Karis Rilling,CMA ? ?

## 2021-07-05 NOTE — Telephone Encounter (Signed)
PCS form started by clinic staff and placed in Dr. Macario Golds box for review and completion at patient's upcoming appointment.  Jojo Geving,CMA ?

## 2021-07-06 NOTE — Telephone Encounter (Signed)
Copy made for patient's chart and original was faxed to [facility].  Brayten Komar,CMA ? ?

## 2021-07-10 DIAGNOSIS — M6281 Muscle weakness (generalized): Secondary | ICD-10-CM | POA: Diagnosis not present

## 2021-07-10 DIAGNOSIS — J984 Other disorders of lung: Secondary | ICD-10-CM | POA: Diagnosis not present

## 2021-07-10 DIAGNOSIS — S7291XB Unspecified fracture of right femur, initial encounter for open fracture type I or II: Secondary | ICD-10-CM | POA: Diagnosis not present

## 2021-07-10 DIAGNOSIS — N182 Chronic kidney disease, stage 2 (mild): Secondary | ICD-10-CM | POA: Diagnosis not present

## 2021-07-11 ENCOUNTER — Encounter: Payer: Self-pay | Admitting: Family Medicine

## 2021-07-11 DIAGNOSIS — Z8781 Personal history of (healed) traumatic fracture: Secondary | ICD-10-CM | POA: Insufficient documentation

## 2021-07-12 ENCOUNTER — Encounter: Payer: Self-pay | Admitting: Family Medicine

## 2021-07-12 ENCOUNTER — Ambulatory Visit (INDEPENDENT_AMBULATORY_CARE_PROVIDER_SITE_OTHER): Payer: Medicare HMO | Admitting: Family Medicine

## 2021-07-12 VITALS — BP 121/71 | HR 80 | Ht 64.0 in | Wt 264.4 lb

## 2021-07-12 DIAGNOSIS — Z8781 Personal history of (healed) traumatic fracture: Secondary | ICD-10-CM | POA: Diagnosis not present

## 2021-07-12 DIAGNOSIS — S72401D Unspecified fracture of lower end of right femur, subsequent encounter for closed fracture with routine healing: Secondary | ICD-10-CM | POA: Diagnosis not present

## 2021-07-12 DIAGNOSIS — I77810 Thoracic aortic ectasia: Secondary | ICD-10-CM | POA: Diagnosis not present

## 2021-07-12 DIAGNOSIS — D649 Anemia, unspecified: Secondary | ICD-10-CM | POA: Diagnosis not present

## 2021-07-12 DIAGNOSIS — J984 Other disorders of lung: Secondary | ICD-10-CM | POA: Diagnosis not present

## 2021-07-12 DIAGNOSIS — I129 Hypertensive chronic kidney disease with stage 1 through stage 4 chronic kidney disease, or unspecified chronic kidney disease: Secondary | ICD-10-CM | POA: Diagnosis not present

## 2021-07-12 DIAGNOSIS — I509 Heart failure, unspecified: Secondary | ICD-10-CM

## 2021-07-12 DIAGNOSIS — E039 Hypothyroidism, unspecified: Secondary | ICD-10-CM | POA: Diagnosis not present

## 2021-07-12 DIAGNOSIS — Z6841 Body Mass Index (BMI) 40.0 and over, adult: Secondary | ICD-10-CM | POA: Diagnosis not present

## 2021-07-12 DIAGNOSIS — E2839 Other primary ovarian failure: Secondary | ICD-10-CM

## 2021-07-12 DIAGNOSIS — N182 Chronic kidney disease, stage 2 (mild): Secondary | ICD-10-CM | POA: Diagnosis not present

## 2021-07-12 DIAGNOSIS — W19XXXD Unspecified fall, subsequent encounter: Secondary | ICD-10-CM | POA: Diagnosis not present

## 2021-07-12 DIAGNOSIS — Z9181 History of falling: Secondary | ICD-10-CM | POA: Diagnosis not present

## 2021-07-12 LAB — POCT HEMOGLOBIN: Hemoglobin: 10.9 g/dL — AB (ref 11–14.6)

## 2021-07-12 NOTE — Assessment & Plan Note (Signed)
Identified during hospitalization post-ORIF. ?POC hemoglobin improved to 10.9 today. ?Continue Iron supplement. ?

## 2021-07-12 NOTE — Progress Notes (Signed)
? ?  SUBJECTIVE:  ? ?CHIEF COMPLAINT / HPI:  ? ?HPI: ?Right femoral fracture: ?Here to f/u from her hospital and rehab discharge. She had a recent right femoral fracture s/p ORIF. ?She denies any pain today. She has been ambulation well at home without difficulty. She also receives HHPT. No concerns today. ? ?Anemia: ?She is here for f/u. She blood level dropped post-op. She endorses compliance with her ferrous sulfate. No bleeding.  ? ?CHF/thoracic aortic ectasia: ?She denies cardiovascular symptoms. She is compliant with her Coreg. She stated that a nurse saw her at home recently and recommended that she get off her Coreg. ? ?HM: Dexa/Shingrix needed ? ?PERTINENT  PMH / PSH: PMHx reviewed. ? ?OBJECTIVE:  ? ?BP 121/71   Pulse 80   Ht '5\' 4"'$  (1.626 m)   Wt 264 lb 6.4 oz (119.9 kg)   SpO2 99%   BMI 45.38 kg/m?   ?Physical Exam ?Vitals and nursing note reviewed.  ?Cardiovascular:  ?   Rate and Rhythm: Normal rate and regular rhythm.  ?   Heart sounds: Normal heart sounds. No murmur heard. ?Pulmonary:  ?   Effort: Pulmonary effort is normal. No respiratory distress.  ?   Breath sounds: No wheezing.  ?Musculoskeletal:  ?   Right lower leg: No edema.  ?   Left lower leg: No edema.  ?   Comments: Right knee incision site looks good. Seated comfortably on her rolling walker  ? ? ? ?ASSESSMENT/PLAN:  ? ?History of femur fracture ?Distal femoral fracture. ?Hospital note and imaging reviewed by me. ?She is doing well post-op. ?Continue HHPT. ?F/U orthopedic as planned.  ? ?Normocytic anemia ?Identified during hospitalization post-ORIF. ?POC hemoglobin improved to 10.9 today. ?Continue Iron supplement. ? ?Congestive heart failure (Miguel Barrera) ?GIIDD in 2019. ?Repeat ECHO in 2021 without documentation of DD. ?She is currently asymptomatic. ?Continue Coreg. ?Monitor closely. ?  ?Shngrix dose #2 discussed and Dexa scan ordered. ?She will contact GI for dexa scan appointment. ? ?[name], MD ?[facility]   ? ?

## 2021-07-12 NOTE — Assessment & Plan Note (Addendum)
Distal femoral fracture. ?Hospital note and imaging reviewed by me. ?She is doing well post-op. ?Continue HHPT. ?F/U orthopedic as planned.  ?

## 2021-07-12 NOTE — Assessment & Plan Note (Signed)
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 ?Her most recent ECHO is from 2021. ?She is currently without cardiopulm symptoms. ?I called her after visit to discuss ECHO to reassess. ?She agreed with the plan. ?ECHO ordered. ?

## 2021-07-12 NOTE — Assessment & Plan Note (Signed)
GIIDD in 2019. ?Repeat ECHO in 2021 without documentation of DD. ?She is currently asymptomatic. ?Continue Coreg. ?Monitor closely. ?

## 2021-07-12 NOTE — Patient Instructions (Addendum)
Bone Density Test A bone density test uses a type of X-ray to measure the amount of calcium and other minerals in a person's bones. It can measure bone density in the hip and the spine. The test is similar to having a regular X-ray. This test may also be called: Bone densitometry. Bone mineral density test. Dual-energy X-ray absorptiometry (DEXA). You may have this test to: Diagnose a condition that causes weak or thin bones (osteoporosis). Screen you for osteoporosis. Predict your risk for a broken bone (fracture). Determine how well your osteoporosis treatment is working. Tell a health care provider about: Any allergies you have. All medicines you are taking, including vitamins, herbs, eye drops, creams, and over-the-counter medicines. Any problems you or family members have had with anesthetic medicines. Any blood disorders you have. Any surgeries you have had. Any medical conditions you have. Whether you are pregnant or may be pregnant. Any medical tests you have had within the past 14 days that used contrast material. What are the risks? Generally, this is a safe test. However, it does expose you to a small amount of radiation, which can slightly increase your cancer risk. What happens before the test? Do not take any calcium supplements within the 24 hours before your test. You will need to remove all metal jewelry, eyeglasses, removable dental appliances, and any other metal objects on your body. What happens during the test?  You will lie down on an exam table. There will be an X-ray generator below you and an imaging device above you. Other devices, such as boxes or braces, may be used to position your body properly for the scan. The machine will slowly scan your body. You will need to keep very still while the machine does the scan. The images will show up on a screen in the room. Images will be examined by a specialist after your test is finished. The procedure may vary among  health care providers and hospitals. What can I expect after the test? It is up to you to get the results of your test. Ask your health care provider, or the department that is doing the test, when your results will be ready. Summary A bone density test is an imaging test that uses a type of X-ray to measure the amount of calcium and other minerals in your bones. The test may be used to diagnose or screen you for a condition that causes weak or thin bones (osteoporosis), predict your risk for a broken bone (fracture), or determine how well your osteoporosis treatment is working. Do not take any calcium supplements within 24 hours before your test. Ask your health care provider, or the department that is doing the test, when your results will be ready. This information is not intended to replace advice given to you by your health care provider. Make sure you discuss any questions you have with your health care provider. Document Revised: 10/27/2020 Document Reviewed: 07/31/2019 Elsevier Patient Education  2023 Elsevier Inc.  

## 2021-07-13 ENCOUNTER — Telehealth: Payer: Self-pay

## 2021-07-13 NOTE — Telephone Encounter (Signed)
Patient calls nurse line elevated blood pressure.  ? ?Patient reports home health came yesterday and she was told her BP was "too high." Patient reports she does not remember the number exactly, however stated "something like 130s/100." Patient reports her BP at our office was normal.  ? ?Patient reports she is compliant with Carvedilol and last took this morning at 8am.  ? ?I asked patient to check her BP while on the phone, 135/95 checked with an automatic cuff.  ? ?Patient denies any current symptoms.  ? ?Red flags discussed.  ? ?Will forward to PCP.  ?

## 2021-07-14 NOTE — Telephone Encounter (Signed)
Called patient back.  Appt made for Tuesday (07/19/21) as she will already be this way for her ECHO @ 3pm.  Christen Bame, CMA

## 2021-07-15 ENCOUNTER — Ambulatory Visit: Payer: Medicare HMO

## 2021-07-15 NOTE — Patient Instructions (Signed)
Visit Information  Ms. Boylan  it was nice speaking with you. Please call me directly [phone] if you have questions about the goals we discussed.  Patient Goals/Self Care Activities: -Patient/Caregiver will self-administer medications as prescribed as evidenced by self-report/primary caregiver report  -Patient/Caregiver will attend all scheduled provider appointments as evidenced by clinician review of documented attendance to scheduled appointments and patient/caregiver report -Patient/Caregiver will call pharmacy for medication refills as evidenced by patient report and review of pharmacy fill history as appropriate -Patient/Caregiver will call provider office for new concerns or questions as evidenced by review of documented incoming telephone call notes and patient report -Patient/Caregiver verbalizes understanding of plan -Patient/Caregiver will focus on medication adherence by taking medications as prescribed -Calls provider office for new concerns, questions, or BP outside discussed parameters -Checks BP and records as discussed -Follows a low sodium diet/DASH diet -Patient will continue to cook healthy meals at home       Patient verbalizes understanding of instructions and care plan provided today and agrees to view in [place]. Active MyChart status and patient understanding of how to access instructions and care plan via MyChart confirmed with patient.     Follow up Plan:  5  weeks.  Lazaro Arms, RN  [phone]

## 2021-07-15 NOTE — Chronic Care Management (AMB) (Signed)
Chronic Care Management   CCM RN Visit Note  07/15/2021 Name: Andrea [name] MRN: [id number] DOB: [birth year]  Subjective: Andrea [name] is a 66 y.o. year old female who is a primary care patient of [name], MD. The care management team was consulted for assistance with disease management and care coordination needs.    Engaged with patient by telephone for follow up visit in response to provider referral for case management and/or care coordination services.   Consent to Services:  The patient was given information about Chronic Care Management services, agreed to services, and gave verbal consent prior to initiation of services.  Please see initial visit note for detailed documentation.   Patient agreed to services and verbal consent obtained.    Summary:  The patient is currently experiencing some varying diastolic numbers . See Care Plan below for interventions and patient self-care actives.  Recommendation: The patient may benefit from taking medications as prescribed, monitoring food intake, checking blood pressures and record values, calling your physician if numbers are abnormal, and The patient agrees.  Follow up Plan: Patient would like continued follow-up.  CCM RNCM will outreach the patient within the next 5 weeks.  Patient will call office if needed prior to next encounter   Assessment: Review of patient past medical history, allergies, medications, health status, including review of consultants reports, laboratory and other test data, was performed as part of comprehensive evaluation and provision of chronic care management services.   SDOH (Social Determinants of Health) assessments and interventions performed:  No.  CCM Care Plan  Conditions to be addressed/monitored:HTN  Care Plan : RN Case Management  Updates made by Lazaro Arms, RN since 07/15/2021 12:00 AM     Problem: Hypertension Management   Priority: High  Onset Date: 01/06/2019  Note:    Current Barriers:  Knowledge Deficits related to basic understanding of hypertension pathophysiology and self-care management   Nurse Case Manager Clinical Goal(s):    Over the next 90 days, patient will demonstrate improved adherence to prescribed treatment plan for hypertension as evidenced by taking all medications as prescribed, monitoring and recording blood pressure as directed, adhering to low sodium/DASH diet  Interventions: 1:1 collaboration with primary care provider regarding development and update of comprehensive plan of care as evidenced by provider attestation and co-signature Inter-disciplinary care team collaboration (see longitudinal plan of care) Evaluation of current treatment plan related to  self management and patient's adherence to plan as established by provider   Hypertension: (Status: Goal on track: NO.) Long Terem Last practice recorded BP readings:  BP Readings from Last 3 Encounters:  07/12/21 121/71  05/21/21 122/90  04/22/21 (!) 168/113  Most recent eGFR/CrCl:  Lab Results  Component Value Date   EGFR 62 04/27/2021    No components found for: CRCL   Evaluation of current treatment plan related to hypertension self-management and patient's adherence to plan as established by provider. Provided education to patient re: stroke prevention, s/s of heart attack and stroke, DASH diet, complications of uncontrolled blood pressure Reviewed medications with patient and discussed importance of compliance Discussed plans with patient for ongoing care management follow up and provided patient with direct contact information for care management team Advised patient, providing education and rationale, to monitor blood pressure daily and record, calling PCP for findings outside established parameters. -  healthy diet promoted -still monitor sodium in her food choices,  medication adherence assessment completed patient is taking her medications as prescribed  support  and  encouragement provided-  07/15/21: The patient has not experienced any headaches, chest pain, or flushing. Yesterday, her blood pressure was 131/95. The patient mentioned that the diastolic number can vary and sometimes elevate. She is scheduled to have her blood pressure checked and undergo an EKG on the 22nd at [facility]. Additionally, the patient has an upcoming appointment with her orthopedist and intends to inquire about extending her visits with physical therapy.   Patient Goals/Self Care Activities: -Patient/Caregiver will self-administer medications as prescribed as evidenced by self-report/primary caregiver report  -Patient/Caregiver will attend all scheduled provider appointments as evidenced by clinician review of documented attendance to scheduled appointments and patient/caregiver report -Patient/Caregiver will call pharmacy for medication refills as evidenced by patient report and review of pharmacy fill history as appropriate -Patient/Caregiver will call provider office for new concerns or questions as evidenced by review of documented incoming telephone call notes and patient report -Patient/Caregiver verbalizes understanding of plan -Patient/Caregiver will focus on medication adherence by taking medications as prescribed -Calls provider office for new concerns, questions, or BP outside discussed parameters -Checks BP and records as discussed -Follows a low sodium diet/DASH diet -Patient will continue to cook healthy meals at home     [place], BSN, [facility] Care Management Coordinator [facility]  Phone: [phone]

## 2021-07-19 ENCOUNTER — Ambulatory Visit (INDEPENDENT_AMBULATORY_CARE_PROVIDER_SITE_OTHER): Payer: Medicare HMO | Admitting: Family Medicine

## 2021-07-19 ENCOUNTER — Ambulatory Visit (HOSPITAL_COMMUNITY)
Admission: RE | Admit: 2021-07-19 | Discharge: 2021-07-19 | Disposition: A | Discharge status: Home / Self Care | Payer: Medicare HMO | Source: Ambulatory Visit | Attending: Family Medicine | Admitting: Family Medicine

## 2021-07-19 VITALS — BP 111/72 | HR 65 | Ht 64.0 in | Wt 264.6 lb

## 2021-07-19 DIAGNOSIS — I10 Essential (primary) hypertension: Secondary | ICD-10-CM

## 2021-07-19 DIAGNOSIS — I509 Heart failure, unspecified: Secondary | ICD-10-CM | POA: Diagnosis not present

## 2021-07-19 DIAGNOSIS — Z8781 Personal history of (healed) traumatic fracture: Secondary | ICD-10-CM

## 2021-07-19 DIAGNOSIS — M25561 Pain in right knee: Secondary | ICD-10-CM

## 2021-07-19 DIAGNOSIS — I071 Rheumatic tricuspid insufficiency: Secondary | ICD-10-CM | POA: Insufficient documentation

## 2021-07-19 DIAGNOSIS — I371 Nonrheumatic pulmonary valve insufficiency: Secondary | ICD-10-CM | POA: Insufficient documentation

## 2021-07-19 DIAGNOSIS — I77810 Thoracic aortic ectasia: Secondary | ICD-10-CM | POA: Diagnosis not present

## 2021-07-19 LAB — ECHOCARDIOGRAM COMPLETE
Height: 64 in
S' Lateral: 3.2 cm
Weight: 4233.6 oz

## 2021-07-19 MED ORDER — KETOROLAC TROMETHAMINE 30 MG/ML IJ SOLN: 30.0000 mg | Freq: Once | INTRAMUSCULAR | Status: DC

## 2021-07-19 MED ORDER — KETOROLAC TROMETHAMINE 30 MG/ML IJ SOLN
15.0000 mg | Freq: Once | INTRAMUSCULAR | Status: AC
  Administered 2021-07-19: 15 mg via INTRAMUSCULAR

## 2021-07-19 MED ORDER — KETOROLAC TROMETHAMINE 30 MG/ML IJ SOLN: 15.0000 mg | Freq: Once | INTRAMUSCULAR | Status: DC

## 2021-07-19 MED ORDER — NAPROXEN 500 MG PO TABS: 500.0000 mg | ORAL_TABLET | Freq: Two times a day (BID) | ORAL | 0 refills | Status: DC

## 2021-07-19 MED ORDER — KETOROLAC TROMETHAMINE 15 MG/ML IJ SOLN: 15.0000 mg | Freq: Once | INTRAMUSCULAR | Status: DC

## 2021-07-19 NOTE — Patient Instructions (Signed)
For your knee pain: Take Naprosyn 500 mg twice a day if needed.  Take after 8p today. Avoid all other pain medication except Tylenol.  You can take the muscle relaxer.   Your blood pressure was normal in the office today.

## 2021-07-19 NOTE — Progress Notes (Signed)
  Echocardiogram 2D Echocardiogram has been performed.  Andrea [name] 07/19/2021, 3:57 PM

## 2021-07-19 NOTE — Assessment & Plan Note (Signed)
Elevated BP with physical therapy.  Today BP is normal after providing pain control.  No changes to her medications.  Continue Coreg

## 2021-07-19 NOTE — Assessment & Plan Note (Addendum)
S/p repair of distal femur fracture.  She has home health physical therapy.  She has acute knee pain.  Given 15 mg Toradol in the office today with improvement.  Advised to not use NSAIDs for the next 6 hours.  Short course of Naprosyn provided for severe pain.  Continue Robaxin PRN.

## 2021-07-19 NOTE — Progress Notes (Signed)
   SUBJECTIVE:   CHIEF COMPLAINT / HPI:   Chief Complaint  Patient presents with   Blood Pressure Check     Andrea [name] is a 66 y.o. female here for blood pressure follow up.  Reports after working out with physical therapy her blood pressure is elevated.  States it is around 130/100.  She was told to follow with her primary care doctor about this.  Of note, patient has significant right knee pain.  She had surgery after a fall resulting in a femur fracture in March.  This morning her pain has been moderately severe.  She took a muscle relaxer but this is not helping.  She has not taken any Tylenol or ibuprofen.  Recent trauma or fall.  PERTINENT  PMH / PSH: reviewed and updated as appropriate   OBJECTIVE:   BP 111/72   Pulse 65   Ht '5\' 4"'$  (1.626 m)   Wt 264 lb 9.6 oz (120 kg)   SpO2 98%   BMI 45.42 kg/m    GEN: tearful female, seated on rollator CV: regular rate  RESP: no increased work of breathing MSK: right knee without significant edema, linear surgical wound with steri-strips across the incision SKIN: warm, dry    ASSESSMENT/PLAN:   HYPERTENSION, BENIGN SYSTEMIC Elevated BP with physical therapy.  Today BP is normal after providing pain control.  No changes to her medications.  Continue Coreg  History of femur fracture  Knee pain S/p repair of displaced distal femur fracture.  Femur x-ray on 05/14/2021 and surgical notes reviewed by me.  She has home health physical therapy.  She has acute knee pain.  Given 15 mg Toradol in the office today with improvement.  Advised to not use NSAIDs for the next 6 hours.  Short course of Naprosyn provided for severe pain.  Continue Robaxin PRN.     Lyndee Hensen, DO PGY-3, [facility] 07/19/2021

## 2021-07-20 ENCOUNTER — Telehealth: Payer: Self-pay | Admitting: Family Medicine

## 2021-07-20 NOTE — Telephone Encounter (Signed)
ECHO result discussed. No dilation of the ascending aorta. Risk for AAA is low given negative smoking hx, female sex, although she has HTN. Her left atrium is severely dilated in the settings of chronic HTN. As discussed with her, we will work on maintaining BP control.  ED precautions discussed. F/U in 4 weeks for BP check. Continue home BP monitoring. She agreed with the plan.

## 2021-07-21 DIAGNOSIS — Z9181 History of falling: Secondary | ICD-10-CM | POA: Diagnosis not present

## 2021-07-21 DIAGNOSIS — E039 Hypothyroidism, unspecified: Secondary | ICD-10-CM | POA: Diagnosis not present

## 2021-07-21 DIAGNOSIS — Z6841 Body Mass Index (BMI) 40.0 and over, adult: Secondary | ICD-10-CM | POA: Diagnosis not present

## 2021-07-21 DIAGNOSIS — I129 Hypertensive chronic kidney disease with stage 1 through stage 4 chronic kidney disease, or unspecified chronic kidney disease: Secondary | ICD-10-CM | POA: Diagnosis not present

## 2021-07-21 DIAGNOSIS — W19XXXD Unspecified fall, subsequent encounter: Secondary | ICD-10-CM | POA: Diagnosis not present

## 2021-07-21 DIAGNOSIS — S72401D Unspecified fracture of lower end of right femur, subsequent encounter for closed fracture with routine healing: Secondary | ICD-10-CM | POA: Diagnosis not present

## 2021-07-21 DIAGNOSIS — N182 Chronic kidney disease, stage 2 (mild): Secondary | ICD-10-CM | POA: Diagnosis not present

## 2021-07-21 DIAGNOSIS — J984 Other disorders of lung: Secondary | ICD-10-CM | POA: Diagnosis not present

## 2021-07-22 DIAGNOSIS — Z9889 Other specified postprocedural states: Secondary | ICD-10-CM | POA: Diagnosis not present

## 2021-07-22 DIAGNOSIS — S72351A Displaced comminuted fracture of shaft of right femur, initial encounter for closed fracture: Secondary | ICD-10-CM | POA: Diagnosis not present

## 2021-08-01 ENCOUNTER — Other Ambulatory Visit: Payer: Self-pay | Admitting: Family Medicine

## 2021-08-02 ENCOUNTER — Encounter: Payer: Self-pay | Admitting: *Deleted

## 2021-08-10 DIAGNOSIS — J984 Other disorders of lung: Secondary | ICD-10-CM | POA: Diagnosis not present

## 2021-08-10 DIAGNOSIS — N182 Chronic kidney disease, stage 2 (mild): Secondary | ICD-10-CM | POA: Diagnosis not present

## 2021-08-10 DIAGNOSIS — S7291XB Unspecified fracture of right femur, initial encounter for open fracture type I or II: Secondary | ICD-10-CM | POA: Diagnosis not present

## 2021-08-10 DIAGNOSIS — R262 Difficulty in walking, not elsewhere classified: Secondary | ICD-10-CM | POA: Diagnosis not present

## 2021-08-10 DIAGNOSIS — S72401D Unspecified fracture of lower end of right femur, subsequent encounter for closed fracture with routine healing: Secondary | ICD-10-CM | POA: Diagnosis not present

## 2021-08-10 DIAGNOSIS — M6281 Muscle weakness (generalized): Secondary | ICD-10-CM | POA: Diagnosis not present

## 2021-08-11 DIAGNOSIS — R262 Difficulty in walking, not elsewhere classified: Secondary | ICD-10-CM | POA: Diagnosis not present

## 2021-08-11 DIAGNOSIS — S72401D Unspecified fracture of lower end of right femur, subsequent encounter for closed fracture with routine healing: Secondary | ICD-10-CM | POA: Diagnosis not present

## 2021-08-11 DIAGNOSIS — M6281 Muscle weakness (generalized): Secondary | ICD-10-CM | POA: Diagnosis not present

## 2021-08-16 ENCOUNTER — Ambulatory Visit: Payer: Medicare HMO

## 2021-08-16 DIAGNOSIS — M6281 Muscle weakness (generalized): Secondary | ICD-10-CM | POA: Diagnosis not present

## 2021-08-16 DIAGNOSIS — S72401D Unspecified fracture of lower end of right femur, subsequent encounter for closed fracture with routine healing: Secondary | ICD-10-CM | POA: Diagnosis not present

## 2021-08-16 DIAGNOSIS — R262 Difficulty in walking, not elsewhere classified: Secondary | ICD-10-CM | POA: Diagnosis not present

## 2021-08-16 NOTE — Chronic Care Management (AMB) (Signed)
Chronic Care Management   CCM RN Visit Note  08/16/2021 Name: Andrea [name] MRN: [id number] DOB: 1955/09/30  Subjective: Andrea [name] is a 66 y.o. year old female who is a primary care patient of [name], MD. The care management team was consulted for assistance with disease management and care coordination needs.    Engaged with patient by telephone for follow up visit in response to provider referral for case management and/or care coordination services.   Consent to Services:  The patient was given information about Chronic Care Management services, agreed to services, and gave verbal consent prior to initiation of services.  Please see initial visit note for detailed documentation.   Patient agreed to services and verbal consent obtained.    Summary:  The patient continues to maintain positive progress with care plan goals. She is still going to therapy for her leg and making progress.  Still having some mild discomfort.See Care Plan below for interventions and patient self-care actives.  Recommendation: The patient may benefit from taking medications as prescribed, checking blood pressures and record values, calling your physician if numbers are abnormal, and The patient agrees.  Follow up Plan: Patient would like continued follow-up.  CM RNCM will outreach the patient within the next 4 weeks.  Patient will call office if needed prior to next encounter   Assessment: Review of patient past medical history, allergies, medications, health status, including review of consultants reports, laboratory and other test data, was performed as part of comprehensive evaluation and provision of chronic care management services.   SDOH (Social Determinants of Health) assessments and interventions performed:  No  CCM Care Plan  Conditions to be addressed/monitored:HTN  Care Plan : RN Case Management  Updates made by Lazaro Arms, RN since 08/16/2021 12:00 AM      Problem: Hypertension Management   Priority: High  Onset Date: 01/06/2019  Note:   Current Barriers:  Knowledge Deficits related to basic understanding of hypertension pathophysiology and self-care management   Nurse Case Manager Clinical Goal(s):    Over the next 90 days, patient will demonstrate improved adherence to prescribed treatment plan for hypertension as evidenced by taking all medications as prescribed, monitoring and recording blood pressure as directed, adhering to low sodium/DASH diet  Interventions: 1:1 collaboration with primary care provider regarding development and update of comprehensive plan of care as evidenced by provider attestation and co-signature Inter-disciplinary care team collaboration (see longitudinal plan of care) Evaluation of current treatment plan related to  self management and patient's adherence to plan as established by provider   Hypertension: (Status: Goal on track: NO.) Long Terem Last practice recorded BP readings:  BP Readings from Last 3 Encounters:  07/19/21 111/72  07/12/21 121/71  05/21/21 122/90  Most recent eGFR/CrCl:  Lab Results  Component Value Date   EGFR 62 04/27/2021    No components found for: "CRCL"   Evaluation of current treatment plan related to hypertension self-management and patient's adherence to plan as established by provider. Provided education to patient re: stroke prevention, s/s of heart attack and stroke, DASH diet, complications of uncontrolled blood pressure Reviewed medications with patient and discussed importance of compliance Discussed plans with patient for ongoing care management follow up and provided patient with direct contact information for care management team Advised patient, providing education and rationale, to monitor blood pressure daily and record, calling PCP for findings outside established parameters. -  healthy diet promoted -still monitor sodium in her food choices,  medication  adherence assessment completed patient is taking her medications as prescribed  support and encouragement provided-  08/16/21: Today, I spoke with Andrea [name], who had just returned from her therapy session. She reported feeling stronger and getting a good workout. Although she still requires a walker, her blood pressure was stable at 135/97 this morning. She has no complaints of headaches, chest pain, or shortness of breath, only some discomfort. She is eating and sleeping well. Upcoming appointments include a follow-up with Dr. [name] on Friday, June 23rd, therapy on June 28th, and an ortho appointment on July 7th.  Patient Goals/Self Care Activities: -Patient/Caregiver will self-administer medications as prescribed as evidenced by self-report/primary caregiver report  -Patient/Caregiver will attend all scheduled provider appointments as evidenced by clinician review of documented attendance to scheduled appointments and patient/caregiver report -Patient/Caregiver will call pharmacy for medication refills as evidenced by patient report and review of pharmacy fill history as appropriate -Patient/Caregiver will call provider office for new concerns or questions as evidenced by review of documented incoming telephone call notes and patient report -Patient/Caregiver verbalizes understanding of plan -Patient/Caregiver will focus on medication adherence by taking medications as prescribed -Calls provider office for new concerns, questions, or BP outside discussed parameters -Checks BP and records as discussed -Follows a low sodium diet/DASH diet -Patient will continue to cook healthy meals at home     [place], BSN, [facility] Care Management Coordinator [facility]  Phone: [phone]

## 2021-08-16 NOTE — Patient Instructions (Signed)
Visit Information  [name]  it was nice speaking with you. Please call me directly [phone] if you have questions about the goals we discussed.  Patient Goals/Self Care Activities: -Patient/Caregiver will self-administer medications as prescribed as evidenced by self-report/primary caregiver report  -Patient/Caregiver will attend all scheduled provider appointments as evidenced by clinician review of documented attendance to scheduled appointments and patient/caregiver report -Patient/Caregiver will call pharmacy for medication refills as evidenced by patient report and review of pharmacy fill history as appropriate -Patient/Caregiver will call provider office for new concerns or questions as evidenced by review of documented incoming telephone call notes and patient report -Patient/Caregiver verbalizes understanding of plan -Patient/Caregiver will focus on medication adherence by taking medications as prescribed -Calls provider office for new concerns, questions, or BP outside discussed parameters -Checks BP and records as discussed -Follows a low sodium diet/DASH diet -Patient will continue to cook healthy meals at home     Patient verbalizes understanding of instructions and care plan provided today and agrees to view in [place]. Active MyChart status and patient understanding of how to access instructions and care plan via MyChart confirmed with patient.     Follow up Plan: Patient would like continued follow-up.  CM RNCM will outreach the patient within the next 5 weeks.  Patient will call office if needed prior to next encounter  Lazaro Arms, RN  [phone]

## 2021-08-19 ENCOUNTER — Encounter: Payer: Self-pay | Admitting: Family Medicine

## 2021-08-19 ENCOUNTER — Ambulatory Visit (INDEPENDENT_AMBULATORY_CARE_PROVIDER_SITE_OTHER): Payer: Medicare HMO | Admitting: Family Medicine

## 2021-08-19 VITALS — BP 107/83 | HR 71 | Ht 64.0 in | Wt 264.0 lb

## 2021-08-19 DIAGNOSIS — S7291XS Unspecified fracture of right femur, sequela: Secondary | ICD-10-CM

## 2021-08-19 DIAGNOSIS — Z8781 Personal history of (healed) traumatic fracture: Secondary | ICD-10-CM

## 2021-08-19 DIAGNOSIS — I10 Essential (primary) hypertension: Secondary | ICD-10-CM | POA: Diagnosis not present

## 2021-08-19 MED ORDER — NAPROXEN 500 MG PO TABS: 500.0000 mg | ORAL_TABLET | Freq: Two times a day (BID) | ORAL | 1 refills | Status: DC | PRN

## 2021-08-19 NOTE — Assessment & Plan Note (Signed)
BP looks good. Continue current regimen. 

## 2021-08-24 DIAGNOSIS — M6281 Muscle weakness (generalized): Secondary | ICD-10-CM | POA: Diagnosis not present

## 2021-08-24 DIAGNOSIS — S72401D Unspecified fracture of lower end of right femur, subsequent encounter for closed fracture with routine healing: Secondary | ICD-10-CM | POA: Diagnosis not present

## 2021-08-24 DIAGNOSIS — R262 Difficulty in walking, not elsewhere classified: Secondary | ICD-10-CM | POA: Diagnosis not present

## 2021-08-25 ENCOUNTER — Other Ambulatory Visit: Payer: Self-pay

## 2021-08-25 MED ORDER — LEVOTHYROXINE SODIUM 125 MCG PO TABS: 125.0000 ug | ORAL_TABLET | Freq: Every day | ORAL | 1 refills | Status: DC

## 2021-09-02 DIAGNOSIS — M79651 Pain in right thigh: Secondary | ICD-10-CM | POA: Diagnosis not present

## 2021-09-02 DIAGNOSIS — M6281 Muscle weakness (generalized): Secondary | ICD-10-CM | POA: Diagnosis not present

## 2021-09-02 DIAGNOSIS — S72401D Unspecified fracture of lower end of right femur, subsequent encounter for closed fracture with routine healing: Secondary | ICD-10-CM | POA: Diagnosis not present

## 2021-09-02 DIAGNOSIS — R262 Difficulty in walking, not elsewhere classified: Secondary | ICD-10-CM | POA: Diagnosis not present

## 2021-09-04 ENCOUNTER — Other Ambulatory Visit: Payer: Self-pay | Admitting: Family Medicine

## 2021-09-07 DIAGNOSIS — R262 Difficulty in walking, not elsewhere classified: Secondary | ICD-10-CM | POA: Diagnosis not present

## 2021-09-07 DIAGNOSIS — M6281 Muscle weakness (generalized): Secondary | ICD-10-CM | POA: Diagnosis not present

## 2021-09-07 DIAGNOSIS — S72401D Unspecified fracture of lower end of right femur, subsequent encounter for closed fracture with routine healing: Secondary | ICD-10-CM | POA: Diagnosis not present

## 2021-09-14 DIAGNOSIS — S72401D Unspecified fracture of lower end of right femur, subsequent encounter for closed fracture with routine healing: Secondary | ICD-10-CM | POA: Diagnosis not present

## 2021-09-14 DIAGNOSIS — M6281 Muscle weakness (generalized): Secondary | ICD-10-CM | POA: Diagnosis not present

## 2021-09-14 DIAGNOSIS — R262 Difficulty in walking, not elsewhere classified: Secondary | ICD-10-CM | POA: Diagnosis not present

## 2021-09-16 ENCOUNTER — Ambulatory Visit: Payer: Medicare HMO

## 2021-09-16 NOTE — Patient Instructions (Signed)
Visit Information  Ms. Adel  it was nice speaking with you. Please call me directly [phone] if you have questions about the goals we discussed.  Patient Goals/Self Care Activities: -Patient/Caregiver will self-administer medications as prescribed as evidenced by self-report/primary caregiver report  -Patient/Caregiver will attend all scheduled provider appointments as evidenced by clinician review of documented attendance to scheduled appointments and patient/caregiver report -Patient/Caregiver will call pharmacy for medication refills as evidenced by patient report and review of pharmacy fill history as appropriate -Patient/Caregiver will call provider office for new concerns or questions as evidenced by review of documented incoming telephone call notes and patient report -Patient/Caregiver verbalizes understanding of plan -Patient/Caregiver will focus on medication adherence by taking medications as prescribed -Calls provider office for new concerns, questions, or BP outside discussed parameters -Checks BP and records as discussed -Follows a low sodium diet/DASH diet -Patient will continue to cook healthy meals at home   Patient verbalizes understanding of instructions and care plan provided today and agrees to view in [place]. Active MyChart status and patient understanding of how to access instructions and care plan via MyChart confirmed with patient.     Follow up Plan: If further intervention is needed the care management team is available to follow up after a formal CM referral is placed  and No follow up scheduled with CM team at this time  Lazaro Arms, RN  [phone]

## 2021-09-16 NOTE — Chronic Care Management (AMB) (Signed)
RNCM Care Management Note 09/16/2021 Name: Andrea [name] MRN: [id number] DOB: 1955/04/05   Andrea [name] is a 66 y.o. year old female who sees Andrea Feil, MD for primary care. RNCM was consulted  to assistance patient with  Care Coordination.      Engaged with patient by telephone for follow up visit in response to provider referral for Care Coordination.     Summary:  The patient continues to maintain positive progress with care plan goals. See Care Plan below for interventions and patient self-care actives.  Recommendation: The patient may benefit from taking medications as prescribed, checking blood pressures and record values, calling your physician if numbers are abnormal, and The patient agrees.  Follow up Plan: If further intervention is needed the care management team is available to follow up after a formal CM referral is placed  and No follow up scheduled with CM team at this time   SDOH (Social Determinants of Health) screening and interventions performed today: no     RN Plan of Care for Conditions/Un Met Needs Addressed and Monitored  Patient Care Plan: RN Case Management     Problem Identified: Hypertension Management Resolved 09/16/2021  Priority: High  Onset Date: 01/06/2019  Note:   Current Barriers:  Knowledge Deficits related to basic understanding of hypertension pathophysiology and self-care management   Nurse Case Manager Clinical Goal(s):    Over the next 90 days, patient will demonstrate improved adherence to prescribed treatment plan for hypertension as evidenced by taking all medications as prescribed, monitoring and recording blood pressure as directed, adhering to low sodium/DASH diet  Interventions: 1:1 collaboration with primary care provider regarding development and update of comprehensive plan of care as evidenced by provider attestation and co-signature Inter-disciplinary care team collaboration (see longitudinal plan of  care) Evaluation of current treatment plan related to  self management and patient's adherence to plan as established by provider   Hypertension: (Status: Goal on track: NO.) [place] Last practice recorded BP readings:  BP Readings from Last 3 Encounters:  08/19/21 107/83  07/19/21 111/72  07/12/21 121/71  Most recent eGFR/CrCl:  Lab Results  Component Value Date   EGFR 62 04/27/2021    No components found for: "CRCL"   Evaluation of current treatment plan related to hypertension self-management and patient's adherence to plan as established by provider. Provided education to patient re: stroke prevention, s/s of heart attack and stroke, DASH diet, complications of uncontrolled blood pressure Reviewed medications with patient and discussed importance of compliance Discussed plans with patient for ongoing care management follow up and provided patient with direct contact information for care management team Advised patient, providing education and rationale, to monitor blood pressure daily and record, calling PCP for findings outside established parameters. -  healthy diet promoted -still monitor sodium in her food choices,  medication adherence assessment completed patient is taking her medications as prescribed  support and encouragement provided-  09/16/21: I spoke with Andrea [name] and am pleased to report that she is doing well. She has not experienced any headaches or chest pain. Although she has been taking her medications, she has not consistently checked her blood pressure. I advised her to continue monitoring her blood pressure and to maintain her regimen. During her last visit with Dr. Rollen [name] on June 23rd, her blood pressure measured 107/83. She has an upcoming appointment with Dr. Gwendlyn [name] in six months. Andrea [name] has completed her physical therapy and is ready to resume exercising. I informed  her that she had achieved her goals and this would be my final call. In case of any  future assistance, don't hesitate to get in touch with Andrea [name].  Patient Goals/Self Care Activities: -Patient/Caregiver will self-administer medications as prescribed as evidenced by self-report/primary caregiver report  -Patient/Caregiver will attend all scheduled provider appointments as evidenced by clinician review of documented attendance to scheduled appointments and patient/caregiver report -Patient/Caregiver will call pharmacy for medication refills as evidenced by patient report and review of pharmacy fill history as appropriate -Patient/Caregiver will call provider office for new concerns or questions as evidenced by review of documented incoming telephone call notes and patient report -Patient/Caregiver verbalizes understanding of plan -Patient/Caregiver will focus on medication adherence by taking medications as prescribed -Calls provider office for new concerns, questions, or BP outside discussed parameters -Checks BP and records as discussed -Follows a low sodium diet/DASH diet -Patient will continue to cook healthy meals at home     Long-Range Goal: Hypertension Monitored Completed 06/11/2020  Start Date: 01/06/2019  Expected End Date: 07/27/2020  Recent Progress: On track  Priority: High  Note:    Current Barriers:  Knowledge Deficits related to basic understanding of hypertension pathophysiology and self care management  Nurse Case Manager Clinical Goal(s):   Over the next 90 days, patient will demonstrate improved adherence to prescribed treatment plan for hypertension as evidenced by taking all medications as prescribed, monitoring and recording blood pressure as directed, adhering to low sodium/DASH diet  Interventions:  Evaluation of current treatment plan related to hypertension self management and patient's adherence to plan as established by provider. Provided education to patient re: stroke prevention, s/s of heart attack and stroke, DASH diet, complications  of uncontrolled blood pressure Reviewed medications with patient and discussed importance of compliance Discussed plans with patient for ongoing care management follow up and provided patient with direct contact information for care management team Advised patient, providing education and rationale, to monitor blood pressure daily and record, calling PCP for findings outside established parameters. - patient is checking her BP. She states she is not checking as much as she should.  She is checking 2x/week.  Today her blood pressure was 167/64.  She reports she has a slight headache but no chest pain shortness of breath or swelling.  She feels that her readings may not be as accurate because her arm monitor broke.  RNCM advise her to use her insurance points to receive a BP monitor  Reviewed scheduled/upcoming provider appointments including:  healthy diet promoted -still monitor sodium in her food choices,  medication adherence assessment completed-patient is taking her medications as prescribed  support and encouragement provided- to continue to check and record values, exercise, and take medications encouraged  patient  to be more active     Patient Goals/Self Care Activities:  Self administers medications as prescribed Attends all scheduled provider appointments Calls provider office for new concerns, questions, or BP outside discussed parameters Checks BP and records as discussed Follows a low sodium diet/DASH diet Find her arm BP monitor and scale and start to record here values agree to work together to make changes ask questions to understand check blood pressure 3 times per week       Lazaro Arms RN, BSN, [place] Management Coordinator [facility]   Phone: [phone]

## 2021-09-21 DIAGNOSIS — S72401D Unspecified fracture of lower end of right femur, subsequent encounter for closed fracture with routine healing: Secondary | ICD-10-CM | POA: Diagnosis not present

## 2021-09-21 DIAGNOSIS — M6281 Muscle weakness (generalized): Secondary | ICD-10-CM | POA: Diagnosis not present

## 2021-09-21 DIAGNOSIS — R262 Difficulty in walking, not elsewhere classified: Secondary | ICD-10-CM | POA: Diagnosis not present

## 2021-09-22 ENCOUNTER — Other Ambulatory Visit: Payer: Self-pay | Admitting: Family Medicine

## 2021-09-22 DIAGNOSIS — I10 Essential (primary) hypertension: Secondary | ICD-10-CM

## 2021-09-26 DIAGNOSIS — H52209 Unspecified astigmatism, unspecified eye: Secondary | ICD-10-CM | POA: Diagnosis not present

## 2021-09-26 DIAGNOSIS — H524 Presbyopia: Secondary | ICD-10-CM | POA: Diagnosis not present

## 2021-09-26 DIAGNOSIS — Z0100 Encounter for examination of eyes and vision without abnormal findings: Secondary | ICD-10-CM | POA: Diagnosis not present

## 2021-09-26 DIAGNOSIS — H2510 Age-related nuclear cataract, unspecified eye: Secondary | ICD-10-CM | POA: Diagnosis not present

## 2021-09-26 DIAGNOSIS — H5213 Myopia, bilateral: Secondary | ICD-10-CM | POA: Diagnosis not present

## 2021-09-27 DIAGNOSIS — S7291XB Unspecified fracture of right femur, initial encounter for open fracture type I or II: Secondary | ICD-10-CM | POA: Diagnosis not present

## 2021-09-27 DIAGNOSIS — M6281 Muscle weakness (generalized): Secondary | ICD-10-CM | POA: Diagnosis not present

## 2021-09-27 DIAGNOSIS — J984 Other disorders of lung: Secondary | ICD-10-CM | POA: Diagnosis not present

## 2021-09-27 DIAGNOSIS — N182 Chronic kidney disease, stage 2 (mild): Secondary | ICD-10-CM | POA: Diagnosis not present

## 2021-09-28 DIAGNOSIS — M6281 Muscle weakness (generalized): Secondary | ICD-10-CM | POA: Diagnosis not present

## 2021-09-28 DIAGNOSIS — S72401D Unspecified fracture of lower end of right femur, subsequent encounter for closed fracture with routine healing: Secondary | ICD-10-CM | POA: Diagnosis not present

## 2021-09-28 DIAGNOSIS — R262 Difficulty in walking, not elsewhere classified: Secondary | ICD-10-CM | POA: Diagnosis not present

## 2021-10-05 DIAGNOSIS — S72401D Unspecified fracture of lower end of right femur, subsequent encounter for closed fracture with routine healing: Secondary | ICD-10-CM | POA: Diagnosis not present

## 2021-10-05 DIAGNOSIS — M6281 Muscle weakness (generalized): Secondary | ICD-10-CM | POA: Diagnosis not present

## 2021-10-05 DIAGNOSIS — R262 Difficulty in walking, not elsewhere classified: Secondary | ICD-10-CM | POA: Diagnosis not present

## 2021-10-19 DIAGNOSIS — R262 Difficulty in walking, not elsewhere classified: Secondary | ICD-10-CM | POA: Diagnosis not present

## 2021-10-19 DIAGNOSIS — S72401D Unspecified fracture of lower end of right femur, subsequent encounter for closed fracture with routine healing: Secondary | ICD-10-CM | POA: Diagnosis not present

## 2021-10-19 DIAGNOSIS — M6281 Muscle weakness (generalized): Secondary | ICD-10-CM | POA: Diagnosis not present

## 2021-10-28 DIAGNOSIS — S72401D Unspecified fracture of lower end of right femur, subsequent encounter for closed fracture with routine healing: Secondary | ICD-10-CM | POA: Diagnosis not present

## 2021-10-28 DIAGNOSIS — R262 Difficulty in walking, not elsewhere classified: Secondary | ICD-10-CM | POA: Diagnosis not present

## 2021-10-28 DIAGNOSIS — M6281 Muscle weakness (generalized): Secondary | ICD-10-CM | POA: Diagnosis not present

## 2021-11-02 DIAGNOSIS — S72401D Unspecified fracture of lower end of right femur, subsequent encounter for closed fracture with routine healing: Secondary | ICD-10-CM | POA: Diagnosis not present

## 2021-11-02 DIAGNOSIS — R262 Difficulty in walking, not elsewhere classified: Secondary | ICD-10-CM | POA: Diagnosis not present

## 2021-11-02 DIAGNOSIS — M6281 Muscle weakness (generalized): Secondary | ICD-10-CM | POA: Diagnosis not present

## 2021-11-10 DIAGNOSIS — N182 Chronic kidney disease, stage 2 (mild): Secondary | ICD-10-CM | POA: Diagnosis not present

## 2021-11-10 DIAGNOSIS — M6281 Muscle weakness (generalized): Secondary | ICD-10-CM | POA: Diagnosis not present

## 2021-11-10 DIAGNOSIS — S7291XB Unspecified fracture of right femur, initial encounter for open fracture type I or II: Secondary | ICD-10-CM | POA: Diagnosis not present

## 2021-11-10 DIAGNOSIS — J984 Other disorders of lung: Secondary | ICD-10-CM | POA: Diagnosis not present

## 2021-11-16 ENCOUNTER — Other Ambulatory Visit: Payer: Self-pay | Admitting: Family Medicine

## 2021-11-29 ENCOUNTER — Other Ambulatory Visit: Payer: Self-pay | Admitting: Family Medicine

## 2021-11-29 DIAGNOSIS — Z1231 Encounter for screening mammogram for malignant neoplasm of breast: Secondary | ICD-10-CM

## 2021-12-10 DIAGNOSIS — J984 Other disorders of lung: Secondary | ICD-10-CM | POA: Diagnosis not present

## 2021-12-10 DIAGNOSIS — M6281 Muscle weakness (generalized): Secondary | ICD-10-CM | POA: Diagnosis not present

## 2021-12-10 DIAGNOSIS — N182 Chronic kidney disease, stage 2 (mild): Secondary | ICD-10-CM | POA: Diagnosis not present

## 2021-12-10 DIAGNOSIS — S7291XB Unspecified fracture of right femur, initial encounter for open fracture type I or II: Secondary | ICD-10-CM | POA: Diagnosis not present

## 2021-12-30 ENCOUNTER — Other Ambulatory Visit: Payer: Self-pay | Admitting: Family Medicine

## 2022-01-04 ENCOUNTER — Telehealth: Payer: Self-pay | Admitting: Family Medicine

## 2022-01-04 ENCOUNTER — Ambulatory Visit
Admission: RE | Admit: 2022-01-04 | Discharge: 2022-01-04 | Disposition: A | Discharge status: Home / Self Care | Payer: Medicare HMO | Source: Ambulatory Visit | Attending: Family Medicine | Admitting: Family Medicine

## 2022-01-04 DIAGNOSIS — M8589 Other specified disorders of bone density and structure, multiple sites: Secondary | ICD-10-CM | POA: Diagnosis not present

## 2022-01-04 DIAGNOSIS — Z780 Asymptomatic menopausal state: Secondary | ICD-10-CM | POA: Diagnosis not present

## 2022-01-04 DIAGNOSIS — Z1231 Encounter for screening mammogram for malignant neoplasm of breast: Secondary | ICD-10-CM | POA: Diagnosis not present

## 2022-01-04 DIAGNOSIS — E2839 Other primary ovarian failure: Secondary | ICD-10-CM

## 2022-01-04 NOTE — Telephone Encounter (Signed)
I called and discussed dexa scan result. Osteopenia. Continue Calcium and Vit D. Repeat Dexa in 2-3 years.

## 2022-01-05 ENCOUNTER — Other Ambulatory Visit: Payer: Self-pay | Admitting: Family Medicine

## 2022-01-06 ENCOUNTER — Other Ambulatory Visit: Payer: Self-pay | Admitting: Family Medicine

## 2022-01-06 ENCOUNTER — Encounter: Payer: Self-pay | Admitting: Family Medicine

## 2022-01-06 DIAGNOSIS — R928 Other abnormal and inconclusive findings on diagnostic imaging of breast: Secondary | ICD-10-CM

## 2022-01-10 DIAGNOSIS — N182 Chronic kidney disease, stage 2 (mild): Secondary | ICD-10-CM | POA: Diagnosis not present

## 2022-01-10 DIAGNOSIS — M6281 Muscle weakness (generalized): Secondary | ICD-10-CM | POA: Diagnosis not present

## 2022-01-10 DIAGNOSIS — S7291XB Unspecified fracture of right femur, initial encounter for open fracture type I or II: Secondary | ICD-10-CM | POA: Diagnosis not present

## 2022-01-10 DIAGNOSIS — J984 Other disorders of lung: Secondary | ICD-10-CM | POA: Diagnosis not present

## 2022-01-11 IMAGING — CR DG CHEST 2V
2 series · 2 of 2 positions shown · non-contrast
Comparison: 04/28/2019

CLINICAL DATA: Short of breath, hypertension

EXAM:
CHEST - 2 VIEW

[chest pa]
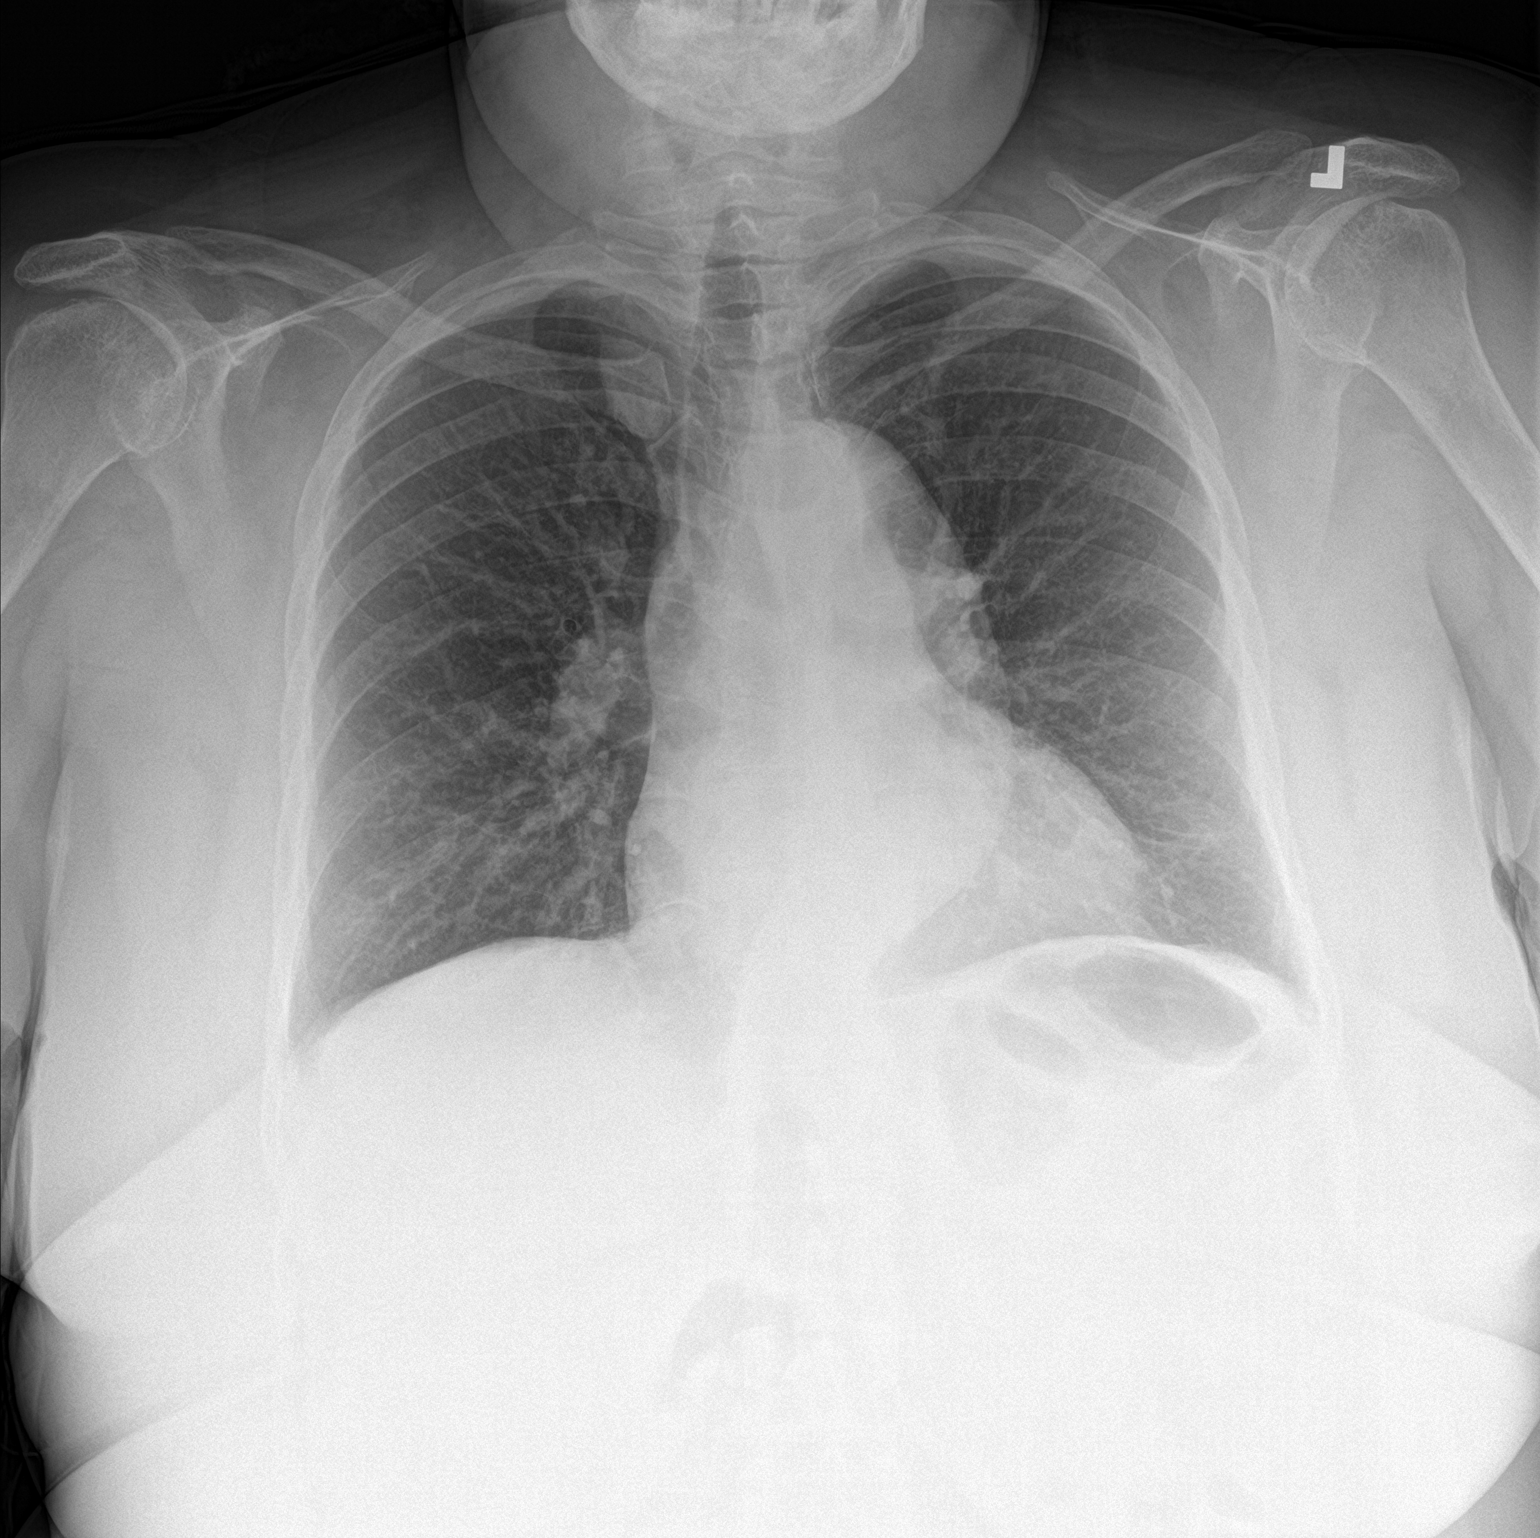

[chest lat]
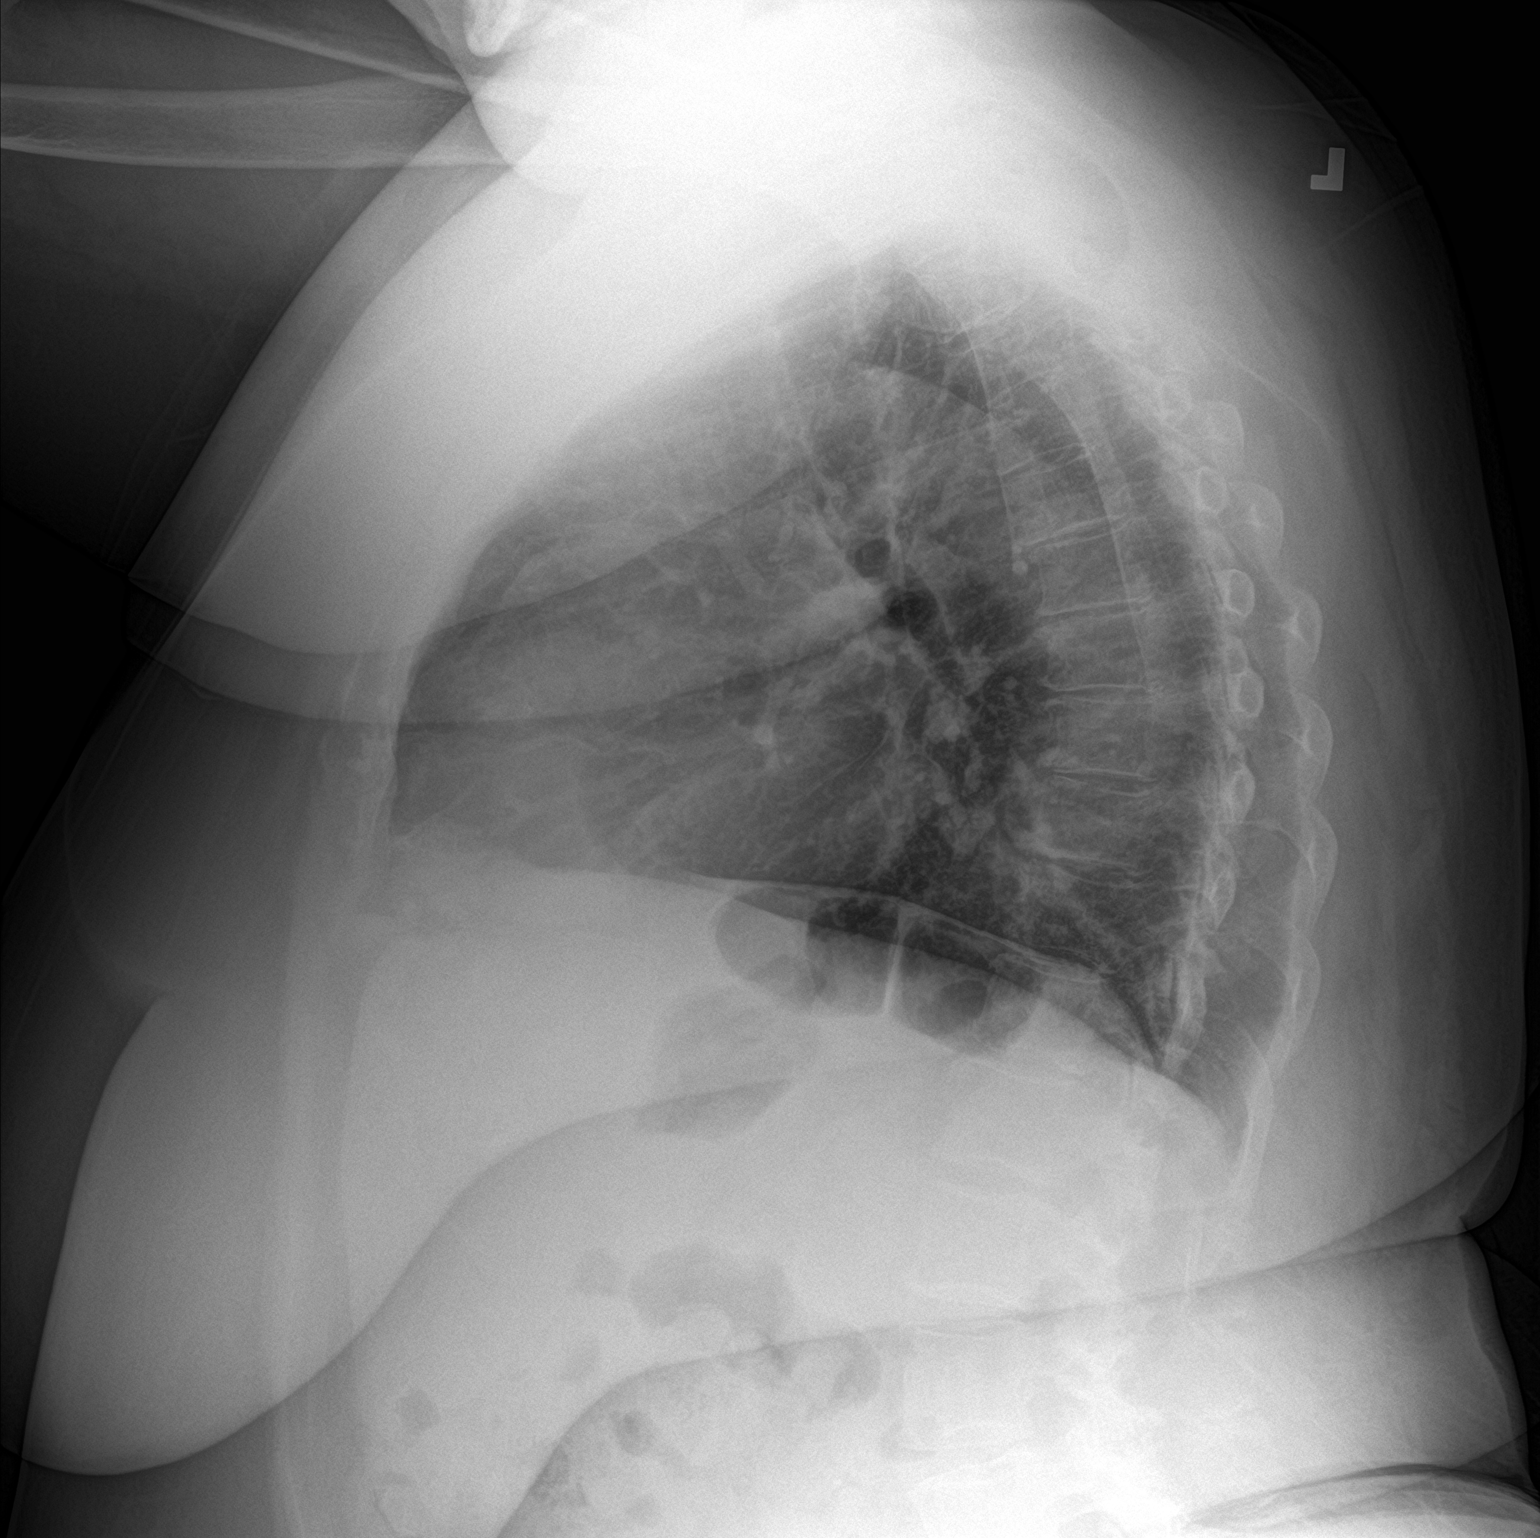

[2 of 2 positions shown; findings below may reference images not displayed]

FINDINGS: Frontal and lateral views of the chest demonstrate a stable cardiac
silhouette. Stable ectasia of the thoracic aorta. No acute airspace
disease, effusion, or pneumothorax. No acute bony abnormality.
IMPRESSION: 1. Stable chest, no acute process.

## 2022-01-17 ENCOUNTER — Ambulatory Visit
Admission: RE | Admit: 2022-01-17 | Discharge: 2022-01-17 | Disposition: A | Discharge status: Home / Self Care | Payer: Medicare HMO | Source: Ambulatory Visit | Attending: Family Medicine | Admitting: Family Medicine

## 2022-01-17 ENCOUNTER — Other Ambulatory Visit: Payer: Self-pay | Admitting: Family Medicine

## 2022-01-17 ENCOUNTER — Other Ambulatory Visit: Payer: Medicare HMO

## 2022-01-17 DIAGNOSIS — N6320 Unspecified lump in the left breast, unspecified quadrant: Secondary | ICD-10-CM | POA: Diagnosis not present

## 2022-01-17 DIAGNOSIS — R928 Other abnormal and inconclusive findings on diagnostic imaging of breast: Secondary | ICD-10-CM

## 2022-01-17 DIAGNOSIS — R92322 Mammographic fibroglandular density, left breast: Secondary | ICD-10-CM | POA: Diagnosis not present

## 2022-01-18 ENCOUNTER — Encounter: Payer: Self-pay | Admitting: Family Medicine

## 2022-01-24 ENCOUNTER — Ambulatory Visit
Admission: RE | Admit: 2022-01-24 | Discharge: 2022-01-24 | Disposition: A | Discharge status: Home / Self Care | Payer: Medicare HMO | Source: Ambulatory Visit | Attending: Family Medicine | Admitting: Family Medicine

## 2022-01-24 DIAGNOSIS — R928 Other abnormal and inconclusive findings on diagnostic imaging of breast: Secondary | ICD-10-CM

## 2022-01-24 DIAGNOSIS — N6321 Unspecified lump in the left breast, upper outer quadrant: Secondary | ICD-10-CM | POA: Diagnosis not present

## 2022-01-24 DIAGNOSIS — N62 Hypertrophy of breast: Secondary | ICD-10-CM | POA: Diagnosis not present

## 2022-01-24 HISTORY — PX: BREAST BIOPSY: SHX20

## 2022-01-27 ENCOUNTER — Encounter: Payer: Self-pay | Admitting: Family Medicine

## 2022-01-29 ENCOUNTER — Telehealth: Payer: Self-pay | Admitting: Family Medicine

## 2022-01-29 DIAGNOSIS — N6092 Unspecified benign mammary dysplasia of left breast: Secondary | ICD-10-CM

## 2022-01-29 NOTE — Telephone Encounter (Signed)
Hello team,  Dr. Owens Shark, Please help contact this patient about her abnormal breast biopsy report. I also placed referral to surgery in anticipation of that call. Thanks

## 2022-01-30 NOTE — Telephone Encounter (Signed)
Called patient. Confirmed DOB and name. Reviewed results. She already has heard from breast center, awaiting call from [facility]. All questions answered. Appreciative of call.  Dorris Singh, MD  Family Medicine Teaching Service

## 2022-02-09 DIAGNOSIS — J984 Other disorders of lung: Secondary | ICD-10-CM | POA: Diagnosis not present

## 2022-02-09 DIAGNOSIS — M6281 Muscle weakness (generalized): Secondary | ICD-10-CM | POA: Diagnosis not present

## 2022-02-09 DIAGNOSIS — N182 Chronic kidney disease, stage 2 (mild): Secondary | ICD-10-CM | POA: Diagnosis not present

## 2022-02-09 DIAGNOSIS — S7291XB Unspecified fracture of right femur, initial encounter for open fracture type I or II: Secondary | ICD-10-CM | POA: Diagnosis not present

## 2022-02-13 ENCOUNTER — Ambulatory Visit: Payer: Self-pay | Admitting: Surgery

## 2022-02-13 DIAGNOSIS — N6092 Unspecified benign mammary dysplasia of left breast: Secondary | ICD-10-CM

## 2022-02-16 ENCOUNTER — Other Ambulatory Visit: Payer: Self-pay | Admitting: Surgery

## 2022-02-16 DIAGNOSIS — N6092 Unspecified benign mammary dysplasia of left breast: Secondary | ICD-10-CM

## 2022-03-09 ENCOUNTER — Other Ambulatory Visit: Payer: Self-pay

## 2022-03-09 ENCOUNTER — Encounter (HOSPITAL_BASED_OUTPATIENT_CLINIC_OR_DEPARTMENT_OTHER): Payer: Self-pay | Admitting: Surgery

## 2022-03-12 DIAGNOSIS — S7291XB Unspecified fracture of right femur, initial encounter for open fracture type I or II: Secondary | ICD-10-CM | POA: Diagnosis not present

## 2022-03-12 DIAGNOSIS — J984 Other disorders of lung: Secondary | ICD-10-CM | POA: Diagnosis not present

## 2022-03-12 DIAGNOSIS — M6281 Muscle weakness (generalized): Secondary | ICD-10-CM | POA: Diagnosis not present

## 2022-03-12 DIAGNOSIS — N182 Chronic kidney disease, stage 2 (mild): Secondary | ICD-10-CM | POA: Diagnosis not present

## 2022-03-15 ENCOUNTER — Ambulatory Visit
Admission: RE | Admit: 2022-03-15 | Discharge: 2022-03-15 | Disposition: A | Discharge status: Home / Self Care | Payer: 59 | Source: Ambulatory Visit | Attending: Surgery | Admitting: Surgery

## 2022-03-15 DIAGNOSIS — N6092 Unspecified benign mammary dysplasia of left breast: Secondary | ICD-10-CM

## 2022-03-15 DIAGNOSIS — R928 Other abnormal and inconclusive findings on diagnostic imaging of breast: Secondary | ICD-10-CM | POA: Diagnosis not present

## 2022-03-15 HISTORY — PX: BREAST BIOPSY: SHX20

## 2022-03-15 NOTE — Progress Notes (Signed)
Patient Instructions  The night before surgery:  No food after midnight. ONLY clear liquids after midnight  The day of surgery (if you do NOT have diabetes):  Drink ONE (1) Pre-Surgery Clear Ensure as directed.   This drink was given to you during your hospital  pre-op appointment visit. The pre-op nurse will instruct you on the time to drink the  Pre-Surgery Ensure depending on your surgery time. Finish the drink at the designated time by the pre-op nurse.  Nothing else to drink after completing the  Pre-Surgery Clear Ensure.  The day of surgery (if you have diabetes): Drink ONE (1) Gatorade 2 (G2) as directed. This drink was given to you during your hospital  pre-op appointment visit.  The pre-op nurse will instruct you on the time to drink the   Gatorade 2 (G2) depending on your surgery time. Color of the Gatorade may vary. Red is not allowed. Nothing else to drink after completing the  Gatorade 2 (G2).         If you have questions, please contact your surgeon's office.Surgical soap given to patient with instructions and patient verbalized understanding.Surgical soap given to patient with instructions and patient verbalized understanding.

## 2022-03-16 ENCOUNTER — Ambulatory Visit (HOSPITAL_BASED_OUTPATIENT_CLINIC_OR_DEPARTMENT_OTHER): Payer: 59 | Admitting: Anesthesiology

## 2022-03-16 ENCOUNTER — Ambulatory Visit
Admission: RE | Admit: 2022-03-16 | Discharge: 2022-03-16 | Disposition: A | Discharge status: Home / Self Care | Payer: Medicare HMO | Source: Ambulatory Visit | Attending: Surgery | Admitting: Surgery

## 2022-03-16 ENCOUNTER — Encounter (HOSPITAL_BASED_OUTPATIENT_CLINIC_OR_DEPARTMENT_OTHER)
Admission: RE | Disposition: A | Discharge status: Home / Self Care | Payer: Self-pay | Source: Home / Self Care | Attending: Surgery

## 2022-03-16 ENCOUNTER — Other Ambulatory Visit: Payer: Self-pay

## 2022-03-16 ENCOUNTER — Encounter (HOSPITAL_BASED_OUTPATIENT_CLINIC_OR_DEPARTMENT_OTHER): Payer: Self-pay | Admitting: Surgery

## 2022-03-16 ENCOUNTER — Ambulatory Visit (HOSPITAL_BASED_OUTPATIENT_CLINIC_OR_DEPARTMENT_OTHER)
Admission: RE | Admit: 2022-03-16 | Discharge: 2022-03-16 | Disposition: A | Discharge status: Home / Self Care | Payer: 59 | Attending: Surgery | Admitting: Surgery

## 2022-03-16 DIAGNOSIS — J45909 Unspecified asthma, uncomplicated: Secondary | ICD-10-CM | POA: Insufficient documentation

## 2022-03-16 DIAGNOSIS — I10 Essential (primary) hypertension: Secondary | ICD-10-CM | POA: Diagnosis not present

## 2022-03-16 DIAGNOSIS — N6092 Unspecified benign mammary dysplasia of left breast: Secondary | ICD-10-CM

## 2022-03-16 DIAGNOSIS — K219 Gastro-esophageal reflux disease without esophagitis: Secondary | ICD-10-CM | POA: Diagnosis not present

## 2022-03-16 DIAGNOSIS — Z01818 Encounter for other preprocedural examination: Secondary | ICD-10-CM

## 2022-03-16 DIAGNOSIS — N6082 Other benign mammary dysplasias of left breast: Secondary | ICD-10-CM | POA: Diagnosis not present

## 2022-03-16 DIAGNOSIS — C50919 Malignant neoplasm of unspecified site of unspecified female breast: Secondary | ICD-10-CM

## 2022-03-16 DIAGNOSIS — D0512 Intraductal carcinoma in situ of left breast: Secondary | ICD-10-CM | POA: Diagnosis not present

## 2022-03-16 DIAGNOSIS — Z6841 Body Mass Index (BMI) 40.0 and over, adult: Secondary | ICD-10-CM | POA: Diagnosis not present

## 2022-03-16 DIAGNOSIS — R928 Other abnormal and inconclusive findings on diagnostic imaging of breast: Secondary | ICD-10-CM | POA: Diagnosis not present

## 2022-03-16 DIAGNOSIS — E039 Hypothyroidism, unspecified: Secondary | ICD-10-CM | POA: Diagnosis not present

## 2022-03-16 HISTORY — PX: BREAST LUMPECTOMY WITH RADIOACTIVE SEED LOCALIZATION: SHX6424

## 2022-03-16 HISTORY — DX: Hypothyroidism, unspecified: E03.9

## 2022-03-16 HISTORY — DX: Malignant neoplasm of unspecified site of unspecified female breast: C50.919

## 2022-03-16 HISTORY — DX: Unspecified asthma, uncomplicated: J45.909

## 2022-03-16 SURGERY — BREAST LUMPECTOMY WITH RADIOACTIVE SEED LOCALIZATION
Anesthesia: General | Site: Breast | Laterality: Left

## 2022-03-16 MED ORDER — GLYCOPYRROLATE 0.2 MG/ML IJ SOLN
INTRAMUSCULAR | Status: DC | PRN
  Administered 2022-03-16: .2 mg via INTRAVENOUS

## 2022-03-16 MED ORDER — GLYCOPYRROLATE PF 0.2 MG/ML IJ SOSY
PREFILLED_SYRINGE | INTRAMUSCULAR | Status: AC
  Filled 2022-03-16: qty 1

## 2022-03-16 MED ORDER — OXYCODONE HCL 5 MG/5ML PO SOLN: 5.0000 mg | Freq: Once | ORAL | Status: DC | PRN

## 2022-03-16 MED ORDER — ACETAMINOPHEN 500 MG PO TABS
ORAL_TABLET | ORAL | Status: AC
  Filled 2022-03-16: qty 2

## 2022-03-16 MED ORDER — SODIUM CHLORIDE 0.9 % IV SOLN: INTRAVENOUS | Status: DC | PRN

## 2022-03-16 MED ORDER — ONDANSETRON HCL 4 MG/2ML IJ SOLN
INTRAMUSCULAR | Status: AC
  Filled 2022-03-16: qty 2

## 2022-03-16 MED ORDER — ONDANSETRON HCL 4 MG/2ML IJ SOLN
INTRAMUSCULAR | Status: DC | PRN
  Administered 2022-03-16: 4 mg via INTRAVENOUS

## 2022-03-16 MED ORDER — FENTANYL CITRATE (PF) 100 MCG/2ML IJ SOLN
INTRAMUSCULAR | Status: AC
  Filled 2022-03-16: qty 2

## 2022-03-16 MED ORDER — CHLORHEXIDINE GLUCONATE CLOTH 2 % EX PADS: 6.0000 | MEDICATED_PAD | Freq: Once | CUTANEOUS | Status: DC

## 2022-03-16 MED ORDER — DEXAMETHASONE SODIUM PHOSPHATE 10 MG/ML IJ SOLN
INTRAMUSCULAR | Status: DC | PRN
  Administered 2022-03-16: 5 mg via INTRAVENOUS

## 2022-03-16 MED ORDER — ACETAMINOPHEN 500 MG PO TABS: 1000.0000 mg | ORAL_TABLET | ORAL | Status: AC

## 2022-03-16 MED ORDER — LACTATED RINGERS IV SOLN: INTRAVENOUS | Status: DC

## 2022-03-16 MED ORDER — ONDANSETRON HCL 4 MG/2ML IJ SOLN: 4.0000 mg | Freq: Once | INTRAMUSCULAR | Status: DC | PRN

## 2022-03-16 MED ORDER — PROPOFOL 10 MG/ML IV BOLUS
INTRAVENOUS | Status: AC
  Filled 2022-03-16: qty 20

## 2022-03-16 MED ORDER — OXYCODONE HCL 5 MG PO TABS: 5.0000 mg | ORAL_TABLET | Freq: Once | ORAL | Status: DC | PRN

## 2022-03-16 MED ORDER — EPHEDRINE SULFATE (PRESSORS) 50 MG/ML IJ SOLN
INTRAMUSCULAR | Status: DC | PRN
  Administered 2022-03-16 (×2): 10 mg via INTRAVENOUS
  Administered 2022-03-16 (×2): 15 mg via INTRAVENOUS

## 2022-03-16 MED ORDER — LIDOCAINE HCL (CARDIAC) PF 100 MG/5ML IV SOSY
PREFILLED_SYRINGE | INTRAVENOUS | Status: DC | PRN
  Administered 2022-03-16: 60 mg via INTRATRACHEAL

## 2022-03-16 MED ORDER — FENTANYL CITRATE (PF) 100 MCG/2ML IJ SOLN
25.0000 ug | INTRAMUSCULAR | Status: DC | PRN
  Administered 2022-03-16: 50 ug via INTRAVENOUS

## 2022-03-16 MED ORDER — MIDAZOLAM HCL 2 MG/2ML IJ SOLN
INTRAMUSCULAR | Status: AC
  Filled 2022-03-16: qty 2

## 2022-03-16 MED ORDER — OXYCODONE HCL 5 MG PO TABS: 5.0000 mg | ORAL_TABLET | Freq: Four times a day (QID) | ORAL | 0 refills | Status: DC | PRN

## 2022-03-16 MED ORDER — PROPOFOL 10 MG/ML IV BOLUS
INTRAVENOUS | Status: DC | PRN
  Administered 2022-03-16: 200 mg via INTRAVENOUS

## 2022-03-16 MED ORDER — ACETAMINOPHEN 500 MG PO TABS
1000.0000 mg | ORAL_TABLET | Freq: Once | ORAL | Status: AC
  Administered 2022-03-16: 1000 mg via ORAL

## 2022-03-16 MED ORDER — PHENYLEPHRINE HCL (PRESSORS) 10 MG/ML IV SOLN
INTRAVENOUS | Status: DC | PRN
  Administered 2022-03-16 (×2): 80 ug via INTRAVENOUS

## 2022-03-16 MED ORDER — FENTANYL CITRATE (PF) 100 MCG/2ML IJ SOLN
INTRAMUSCULAR | Status: DC | PRN
  Administered 2022-03-16: 50 ug via INTRAVENOUS

## 2022-03-16 MED ORDER — CEFAZOLIN IN SODIUM CHLORIDE 3-0.9 GM/100ML-% IV SOLN
INTRAVENOUS | Status: AC
  Filled 2022-03-16: qty 100

## 2022-03-16 MED ORDER — PHENYLEPHRINE 80 MCG/ML (10ML) SYRINGE FOR IV PUSH (FOR BLOOD PRESSURE SUPPORT)
PREFILLED_SYRINGE | INTRAVENOUS | Status: AC
  Filled 2022-03-16: qty 10

## 2022-03-16 MED ORDER — CEFAZOLIN IN SODIUM CHLORIDE 3-0.9 GM/100ML-% IV SOLN
3.0000 g | INTRAVENOUS | Status: AC
  Administered 2022-03-16: 3 g via INTRAVENOUS

## 2022-03-16 MED ORDER — SODIUM CHLORIDE 0.9 % IV SOLN
INTRAVENOUS | Status: AC
  Filled 2022-03-16: qty 10

## 2022-03-16 MED ORDER — EPHEDRINE 5 MG/ML INJ
INTRAVENOUS | Status: AC
  Filled 2022-03-16: qty 5

## 2022-03-16 MED ORDER — BUPIVACAINE HCL (PF) 0.25 % IJ SOLN
INTRAMUSCULAR | Status: DC | PRN
  Administered 2022-03-16: 20 mL

## 2022-03-16 SURGICAL SUPPLY — 47 items
APPLIER CLIP 9.375 MED OPEN (MISCELLANEOUS)
BINDER BREAST 3XL (GAUZE/BANDAGES/DRESSINGS) IMPLANT
BINDER BREAST LRG (GAUZE/BANDAGES/DRESSINGS) IMPLANT
BINDER BREAST MEDIUM (GAUZE/BANDAGES/DRESSINGS) IMPLANT
BINDER BREAST XLRG (GAUZE/BANDAGES/DRESSINGS) IMPLANT
BINDER BREAST XXLRG (GAUZE/BANDAGES/DRESSINGS) IMPLANT
BLADE SURG 15 STRL LF DISP TIS (BLADE) ×1 IMPLANT
BLADE SURG 15 STRL SS (BLADE) ×1
CANISTER SUC SOCK COL 7IN (MISCELLANEOUS) IMPLANT
CANISTER SUCT 1200ML W/VALVE (MISCELLANEOUS) IMPLANT
CHLORAPREP W/TINT 26 (MISCELLANEOUS) ×1 IMPLANT
CLIP APPLIE 9.375 MED OPEN (MISCELLANEOUS) IMPLANT
COVER BACK TABLE 60X90IN (DRAPES) ×1 IMPLANT
COVER MAYO STAND STRL (DRAPES) ×1 IMPLANT
COVER PROBE CYLINDRICAL 5X96 (MISCELLANEOUS) ×1 IMPLANT
DERMABOND ADVANCED .7 DNX12 (GAUZE/BANDAGES/DRESSINGS) ×1 IMPLANT
DRAPE LAPAROSCOPIC ABDOMINAL (DRAPES) IMPLANT
DRAPE LAPAROTOMY 100X72 PEDS (DRAPES) ×1 IMPLANT
DRAPE UTILITY XL STRL (DRAPES) ×1 IMPLANT
ELECT COATED BLADE 2.86 ST (ELECTRODE) ×1 IMPLANT
ELECT REM PT RETURN 9FT ADLT (ELECTROSURGICAL) ×1
ELECTRODE REM PT RTRN 9FT ADLT (ELECTROSURGICAL) ×1 IMPLANT
GLOVE BIOGEL PI IND STRL 8 (GLOVE) ×1 IMPLANT
GLOVE ECLIPSE 8.0 STRL XLNG CF (GLOVE) ×1 IMPLANT
GOWN STRL REUS W/ TWL LRG LVL3 (GOWN DISPOSABLE) ×2 IMPLANT
GOWN STRL REUS W/ TWL XL LVL3 (GOWN DISPOSABLE) ×1 IMPLANT
GOWN STRL REUS W/TWL LRG LVL3 (GOWN DISPOSABLE) ×2
GOWN STRL REUS W/TWL XL LVL3 (GOWN DISPOSABLE) ×1
HEMOSTAT ARISTA ABSORB 3G PWDR (HEMOSTASIS) IMPLANT
HEMOSTAT SNOW SURGICEL 2X4 (HEMOSTASIS) IMPLANT
KIT MARKER MARGIN INK (KITS) ×1 IMPLANT
NDL HYPO 25X1 1.5 SAFETY (NEEDLE) ×1 IMPLANT
NEEDLE HYPO 25X1 1.5 SAFETY (NEEDLE) ×1 IMPLANT
NS IRRIG 1000ML POUR BTL (IV SOLUTION) ×1 IMPLANT
PACK BASIN DAY SURGERY FS (CUSTOM PROCEDURE TRAY) ×1 IMPLANT
PENCIL SMOKE EVACUATOR (MISCELLANEOUS) ×1 IMPLANT
SLEEVE SCD COMPRESS KNEE MED (STOCKING) ×1 IMPLANT
SPIKE FLUID TRANSFER (MISCELLANEOUS) IMPLANT
SPONGE T-LAP 4X18 ~~LOC~~+RFID (SPONGE) ×1 IMPLANT
SUT MNCRL AB 4-0 PS2 18 (SUTURE) ×1 IMPLANT
SUT SILK 2 0 SH (SUTURE) IMPLANT
SUT VICRYL 3-0 CR8 SH (SUTURE) ×1 IMPLANT
SYR CONTROL 10ML LL (SYRINGE) ×1 IMPLANT
TOWEL GREEN STERILE FF (TOWEL DISPOSABLE) ×1 IMPLANT
TRAY FAXITRON CT DISP (TRAY / TRAY PROCEDURE) ×1 IMPLANT
TUBE CONNECTING 20X1/4 (TUBING) IMPLANT
YANKAUER SUCT BULB TIP NO VENT (SUCTIONS) IMPLANT

## 2022-03-16 NOTE — Anesthesia Postprocedure Evaluation (Signed)
Anesthesia Post Note  Patient: Andrea [name]  Procedure(s) Performed: LEFT BREAST LUMPECTOMY WITH RADIOACTIVE SEED LOCALIZATION (Left: Breast)     Patient location during evaluation: PACU Anesthesia Type: General Level of consciousness: awake and alert Pain management: pain level controlled Vital Signs Assessment: post-procedure vital signs reviewed and stable Respiratory status: spontaneous breathing, nonlabored ventilation and respiratory function stable Cardiovascular status: stable and blood pressure returned to baseline Anesthetic complications: no   No notable events documented.  Last Vitals:  Vitals:   03/16/22 1500 03/16/22 1515  BP: 122/81 106/78  Pulse: 61 62  Resp: 20 13  Temp:    SpO2: 93% 99%    Last Pain:  Vitals:   03/16/22 1500  TempSrc:   PainSc: [place]

## 2022-03-16 NOTE — Anesthesia Preprocedure Evaluation (Addendum)
Anesthesia Evaluation  Patient identified by MRN, date of birth, ID band Patient awake    Reviewed: Allergy & Precautions, NPO status , Patient's Chart, lab work & pertinent test results  History of Anesthesia Complications Negative for: history of anesthetic complications  Airway Mallampati: III  TM Distance: >3 FB Neck ROM: Full    Dental  (+) Dental Advisory Given, Chipped,    Pulmonary asthma    Pulmonary exam normal        Cardiovascular hypertension, Pt. on home beta blockers and Pt. on medications Normal cardiovascular exam+ dysrhythmias    '23 TTE - EF 55 to 60%. The right ventricular size is mildly enlarged. Left atrial size was severely dilated. Trivial mitral valve regurgitation. Tricuspid valve regurgitation is moderate. Aortic valve regurgitation is trivial.      Neuro/Psych  PSYCHIATRIC DISORDERS  Depression    negative neurological ROS     GI/Hepatic Neg liver ROS,GERD  Controlled,,  Endo/Other  Hypothyroidism  Morbid obesity  Renal/GU negative Renal ROS     Musculoskeletal negative musculoskeletal ROS (+)    Abdominal  (+) + obese  Peds  Hematology negative hematology ROS (+)   Anesthesia Other Findings   Reproductive/Obstetrics                             Anesthesia Physical Anesthesia Plan  ASA: 3  Anesthesia Plan: General   Post-op Pain Management: Tylenol PO (pre-op)*   Induction: Intravenous  PONV Risk Score and Plan: 3 and Treatment may vary due to age or medical condition, Ondansetron and Dexamethasone  Airway Management Planned: LMA  Additional Equipment: None  Intra-op Plan:   Post-operative Plan: Extubation in OR  Informed Consent: I have reviewed the patients History and Physical, chart, labs and discussed the procedure including the risks, benefits and alternatives for the proposed anesthesia with the patient or authorized representative who has  indicated his/her understanding and acceptance.     Dental advisory given  Plan Discussed with: CRNA and Anesthesiologist  Anesthesia Plan Comments:         Anesthesia Quick Evaluation

## 2022-03-16 NOTE — H&P (Signed)
History of Present Illness: Andrea [name] is a 67 y.o. female who is seen today as an office consultation for evaluation of Breast Mass .   Patient presents for evaluation of abnormal left breast biopsy. Core biopsy demonstrated atypical ductal hyperplasia. No history of breast pain, breast mass or nipple discharge. No family history of breast cancer. She has some soreness in the left breast with biopsy.  Review of Systems: A complete review of systems was obtained from the patient. I have reviewed this information and discussed as appropriate with the patient. See HPI as well for other ROS.    Medical History: Past Medical History:  Diagnosis Date  Anxiety  Hyperlipidemia  Hypertension  Thyroid disease   There is no problem list on file for this patient.  History reviewed. No pertinent surgical history.   No Known Allergies  Current Outpatient Medications on File Prior to Visit  Medication Sig Dispense Refill  carvediloL (COREG) 6.25 MG tablet TAKE 1 TABLET BY MOUTH TWICE DAILY WITH A MEAL  cholecalciferol (VITAMIN D3) 2,000 unit capsule Take by mouth  famotidine (PEPCID) 20 MG tablet Take 20 mg by mouth once daily  levothyroxine (SYNTHROID) 125 MCG tablet TAKE 1 TABLET BY MOUTH ONCE DAILY BEFORE BREAKFAST  omega-3 fatty acids/fish oil (FISH OIL) 340-1,000 mg capsule Take by mouth  sertraline (ZOLOFT) 50 MG tablet Take 2 tablets by mouth once daily  simvastatin (ZOCOR) 40 MG tablet Take 40 mg by mouth at bedtime   No current facility-administered medications on file prior to visit.   History reviewed. No pertinent family history.   Social History   Tobacco Use  Smoking Status Never  Smokeless Tobacco Never    Social History   Socioeconomic History  Marital status: Single  Tobacco Use  Smoking status: Never  Smokeless tobacco: Never   Objective:   Vitals:  02/13/22 1500  BP: (!) 142/88  Pulse: 96  Weight: (!) 130.4 kg (287 lb 6.4 oz)  Height: 162.6 cm  ('5\' 4"'$ )  PainSc: 2   Body mass index is 49.33 kg/m.  Physical Exam Exam conducted with a chaperone present.  Eyes:  Pupils: Pupils are equal, round, and reactive to light.  Cardiovascular:  Rate and Rhythm: Normal rate.  Pulmonary:  Effort: Pulmonary effort is normal.  Breath sounds: No stridor.  Chest:  Breasts: Right: Normal.  Left: Normal.  Musculoskeletal:  Cervical back: Normal range of motion.  Lymphadenopathy:  Upper Body:  Right upper body: No supraclavicular or axillary adenopathy.  Left upper body: No supraclavicular or axillary adenopathy.  Neurological:  General: No focal deficit present.  Mental Status: She is alert.  Psychiatric:  Mood and Affect: Mood normal.  Thought Content: Thought content normal.     Labs, Imaging and Diagnostic Testing:  Diagnosis Breast, left, needle core biopsy, lateral, anterior ATYPICAL DUCTAL HYPERPLASIA (ADH) INVOLVING A PAPILLARY LESION. SEE NOTE. Diagnosis Note Immunohistochemistry for cytokeratin 5/6, calponin, p63 and smooth muscle myosin supports the diagnosis. Dr. Vic Ripper agrees. [name] was notified on 01/25/2022. [name] MD Pathologist, Electronic Signature (Case signed 01/27/2022) Specimen Gross and Clinical Information Specimen Comment TIF: 11:26 AM, CIT < 5 min; mass with calcs Specimen(s) Obtained: Breast, left, needle core biopsy, lateral, anterior Specimen Clinical Assessment and Plan:   Diagnoses and all orders for this visit:  Atypical ductal hyperplasia of left breast    Recommend left breast lumpectomy with upgrade risk. 20%.The procedure has been discussed with the patient. Alternatives to surgery have been discussed with  the patient. Risks of surgery include bleeding, Infection, Seroma formation, death, and the need for further surgery. The patient understands and wishes to proceed.   No follow-ups on file.  Kennieth Francois, MD

## 2022-03-16 NOTE — Anesthesia Procedure Notes (Signed)
Procedure Name: LMA Insertion Date/Time: 03/16/2022 1:56 PM  Performed by: Glory Buff, CRNAPre-anesthesia Checklist: Patient identified, Emergency Drugs available, Suction available and Patient being monitored Patient Re-evaluated:Patient Re-evaluated prior to induction Oxygen Delivery Method: Circle system utilized Preoxygenation: Pre-oxygenation with 100% oxygen Induction Type: IV induction LMA: LMA inserted LMA Size: 4.0 Number of attempts: 1 Placement Confirmation: positive ETCO2 Tube secured with: Tape Dental Injury: Teeth and Oropharynx as per pre-operative assessment

## 2022-03-16 NOTE — Discharge Instructions (Addendum)
[place] Office Phone Number [phone]  BREAST BIOPSY/ PARTIAL MASTECTOMY: POST OP INSTRUCTIONS  Always review your discharge instruction sheet given to you by the facility where your surgery was performed.  IF YOU HAVE DISABILITY OR FAMILY LEAVE FORMS, YOU MUST BRING THEM TO THE OFFICE FOR PROCESSING.  DO NOT GIVE THEM TO YOUR DOCTOR.  A prescription for pain medication may be given to you upon discharge.  Take your pain medication as prescribed, if needed.  If narcotic pain medicine is not needed, then you may take acetaminophen (Tylenol) or ibuprofen (Advil) as needed. No Tylenol until after 6:30pm today. Take your usually prescribed medications unless otherwise directed If you need a refill on your pain medication, please contact your pharmacy.  They will contact our office to request authorization.  Prescriptions will not be filled after 5pm or on week-ends. You should eat very light the first 24 hours after surgery, such as soup, crackers, pudding, etc.  Resume your normal diet the day after surgery. Most patients will experience some swelling and bruising in the breast.  Ice packs and a good support bra will help.  Swelling and bruising can take several days to resolve.  It is common to experience some constipation if taking pain medication after surgery.  Increasing fluid intake and taking a stool softener will usually help or prevent this problem from occurring.  A mild laxative (Milk of Magnesia or Miralax) should be taken according to package directions if there are no bowel movements after 48 hours. Unless discharge instructions indicate otherwise, you may remove your bandages 24-48 hours after surgery, and you may shower at that time.  You may have steri-strips (small skin tapes) in place directly over the incision.  These strips should be left on the skin for 7-10 days.  If your surgeon used skin glue on the incision, you may shower in 24 hours.  The glue will flake  off over the next 2-3 weeks.  Any sutures or staples will be removed at the office during your follow-up visit. ACTIVITIES:  You may resume regular daily activities (gradually increasing) beginning the next day.  Wearing a good support bra or sports bra minimizes pain and swelling.  You may have sexual intercourse when it is comfortable. You may drive when you no longer are taking prescription pain medication, you can comfortably wear a seatbelt, and you can safely maneuver your car and apply brakes. RETURN TO WORK:  ______________________________________________________________________________________ Dennis Bast should see your doctor in the office for a follow-up appointment approximately two weeks after your surgery.  Your doctor's nurse will typically make your follow-up appointment when she calls you with your pathology report.  Expect your pathology report 2-3 business days after your surgery.  You may call to check if you do not hear from [place] after three days. OTHER INSTRUCTIONS: _______________________________________________________________________________________________ _____________________________________________________________________________________________________________________________________ _____________________________________________________________________________________________________________________________________ _____________________________________________________________________________________________________________________________________  WHEN TO CALL YOUR DOCTOR: Fever over 101.0 Nausea and/or vomiting. Extreme swelling or bruising. Continued bleeding from incision. Increased pain, redness, or drainage from the incision.  The clinic staff is available to answer your questions during regular business hours.  Please don't hesitate to call and ask to speak to one of the nurses for clinical concerns.  If you have a medical emergency, go to the nearest emergency room or call  [phone].  A surgeon from [facility] is always on call at the hospital.  For further questions, please visit [email or web address]     Post Anesthesia Home Care Instructions  Activity: Get  plenty of rest for the remainder of the day. A responsible individual must stay with you for 24 hours following the procedure.  For the next 24 hours, DO NOT: -Drive a car -Paediatric nurse -Drink alcoholic beverages -Take any medication unless instructed by your physician -Make any legal decisions or sign important papers.  Meals: Start with liquid foods such as gelatin or soup. Progress to regular foods as tolerated. Avoid greasy, spicy, heavy foods. If nausea and/or vomiting occur, drink only clear liquids until the nausea and/or vomiting subsides. Call your physician if vomiting continues.  Special Instructions/Symptoms: Your throat may feel dry or sore from the anesthesia or the breathing tube placed in your throat during surgery. If this causes discomfort, gargle with warm salt water. The discomfort should disappear within 24 hours.  If you had a scopolamine patch placed behind your ear for the management of post- operative nausea and/or vomiting:  1. The medication in the patch is effective for 72 hours, after which it should be removed.  Wrap patch in a tissue and discard in the trash. Wash hands thoroughly with soap and water. 2. You may remove the patch earlier than 72 hours if you experience unpleasant side effects which may include dry mouth, dizziness or visual disturbances. 3. Avoid touching the patch. Wash your hands with soap and water after contact with the patch.   May take Tylenol after 6:30pm, if needed.    Post Anesthesia Home Care Instructions  Activity: Get plenty of rest for the remainder of the day. A responsible individual must stay with you for 24 hours following the procedure.  For the next 24 hours, DO NOT: -Drive a car -Paediatric nurse -Drink  alcoholic beverages -Take any medication unless instructed by your physician -Make any legal decisions or sign important papers.  Meals: Start with liquid foods such as gelatin or soup. Progress to regular foods as tolerated. Avoid greasy, spicy, heavy foods. If nausea and/or vomiting occur, drink only clear liquids until the nausea and/or vomiting subsides. Call your physician if vomiting continues.  Special Instructions/Symptoms: Your throat may feel dry or sore from the anesthesia or the breathing tube placed in your throat during surgery. If this causes discomfort, gargle with warm salt water. The discomfort should disappear within 24 hours.  If you had a scopolamine patch placed behind your ear for the management of post- operative nausea and/or vomiting:  1. The medication in the patch is effective for 72 hours, after which it should be removed.  Wrap patch in a tissue and discard in the trash. Wash hands thoroughly with soap and water. 2. You may remove the patch earlier than 72 hours if you experience unpleasant side effects which may include dry mouth, dizziness or visual disturbances. 3. Avoid touching the patch. Wash your hands with soap and water after contact with the patch.

## 2022-03-16 NOTE — Interval H&P Note (Signed)
History and Physical Interval Note:  03/16/2022 1:41 PM  Andrea [name]  has presented today for surgery, with the diagnosis of ATYPICAL DUCTAL HYPERPLASIA LEFT BREAST.  The various methods of treatment have been discussed with the patient and family. After consideration of risks, benefits and other options for treatment, the patient has consented to  Procedure(s): LEFT BREAST LUMPECTOMY WITH RADIOACTIVE SEED LOCALIZATION (Left) as a surgical intervention.  The patient's history has been reviewed, patient examined, no change in status, stable for surgery.  I have reviewed the patient's chart and labs.  Questions were answered to the patient's satisfaction.     [name]

## 2022-03-16 NOTE — Transfer of Care (Signed)
Immediate Anesthesia Transfer of Care Note  Patient: Jashira A Hild  Procedure(s) Performed: LEFT BREAST LUMPECTOMY WITH RADIOACTIVE SEED LOCALIZATION (Left: Breast)  Patient Location: PACU  Anesthesia Type:General  Level of Consciousness: drowsy, patient cooperative, and responds to stimulation  Airway & Oxygen Therapy: Patient Spontanous Breathing and Patient connected to face mask oxygen  Post-op Assessment: Report given to RN and Post -op Vital signs reviewed and stable  Post vital signs: Reviewed and stable  Last Vitals:  Vitals Value Taken Time  BP    Temp    Pulse 65 03/16/22 1440  Resp 15 03/16/22 1440  SpO2 98 % 03/16/22 1440  Vitals shown include unvalidated device data.  Last Pain:  Vitals:   03/16/22 1231  TempSrc: Oral  PainSc: 0-No pain      Patients Stated Pain Goal: 4 (38/[id number])  Complications: No notable events documented.

## 2022-03-16 NOTE — Op Note (Signed)
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  Core biopsy showed atypical duct hyperplasia arising in the papillary lesion.  Excision was recommended due to potential upgrade risk in the circumstance with her.  Risk and benefits of lumpectomy reviewed with the patient.  The rationale for doing so was reviewed.  Potential upgrade risk of 10 to 20% for cancer were quoted.  Complications were also mentioned as well as observation as an alternative.  She was to proceed with left breast lumpectomy seed localization.  The procedure has been discussed with the patient. Alternatives to surgery have been discussed with the patient.  Risks of surgery include bleeding,  Infection,  Seroma formation, death,  and the need for further surgery.   The patient understands and wishes to proceed.     Description of procedure: The patient was met in the holding area and questions were answered.  Left breast was marked as the correct site.  Films were available for review.  She had a seed placed as an outpatient.  She was then taken back to the operating room.  She was placed supine upon the OR table.  After induction of general anesthesia, left breast was prepped and draped in sterile fashion.  Timeout performed.  Proper patient, site and procedure verified.  Neoprobe was used to identify the seed in the left breast upper outer quadrant.  A curvilinear incision was made over the signal.  Local anesthetic was infiltrated.  Dissection was carried down all tissue around the seed and clip were excised with a  grossly negative margin.  The cavity was irrigated.  Local anesthetic was infiltrated throughout.  Hemostasis was achieved.  The Faxitron image revealed the seed and clip to be in the specimen.  The deep layer was then closed with 3-0 Vicryl.  4 Monocryl was used to close the skin in a subcuticular fashion.  Dermabond applied.  Breast binder placed.  All counts found to be correct.  The patient was awoke extubated taken to recovery in satisfactory condition.[name]

## 2022-03-17 ENCOUNTER — Encounter (HOSPITAL_BASED_OUTPATIENT_CLINIC_OR_DEPARTMENT_OTHER): Payer: Self-pay | Admitting: Surgery

## 2022-03-22 ENCOUNTER — Encounter: Payer: Self-pay | Admitting: Surgery

## 2022-03-27 ENCOUNTER — Encounter: Payer: Self-pay | Admitting: Surgery

## 2022-03-30 LAB — SURGICAL PATHOLOGY

## 2022-03-31 ENCOUNTER — Encounter: Payer: Self-pay | Admitting: Family Medicine

## 2022-03-31 ENCOUNTER — Ambulatory Visit (INDEPENDENT_AMBULATORY_CARE_PROVIDER_SITE_OTHER): Payer: 59 | Admitting: Family Medicine

## 2022-03-31 VITALS — BP 156/111 | HR 86 | Ht 63.0 in | Wt 281.0 lb

## 2022-03-31 DIAGNOSIS — Z0000 Encounter for general adult medical examination without abnormal findings: Secondary | ICD-10-CM

## 2022-03-31 DIAGNOSIS — Z1211 Encounter for screening for malignant neoplasm of colon: Secondary | ICD-10-CM | POA: Diagnosis not present

## 2022-03-31 DIAGNOSIS — F331 Major depressive disorder, recurrent, moderate: Secondary | ICD-10-CM

## 2022-03-31 DIAGNOSIS — I10 Essential (primary) hypertension: Secondary | ICD-10-CM

## 2022-03-31 DIAGNOSIS — R94120 Abnormal auditory function study: Secondary | ICD-10-CM

## 2022-03-31 DIAGNOSIS — E038 Other specified hypothyroidism: Secondary | ICD-10-CM

## 2022-03-31 DIAGNOSIS — J984 Other disorders of lung: Secondary | ICD-10-CM | POA: Diagnosis not present

## 2022-03-31 DIAGNOSIS — R0600 Dyspnea, unspecified: Secondary | ICD-10-CM

## 2022-03-31 MED ORDER — FLUTICASONE-SALMETEROL 115-21 MCG/ACT IN AERO: 2.0000 | INHALATION_SPRAY | Freq: Two times a day (BID) | RESPIRATORY_TRACT | 12 refills | Status: DC

## 2022-03-31 MED ORDER — ALBUTEROL SULFATE HFA 108 (90 BASE) MCG/ACT IN AERS: 2.0000 | INHALATION_SPRAY | Freq: Four times a day (QID) | RESPIRATORY_TRACT | 1 refills | Status: DC | PRN

## 2022-03-31 MED ORDER — LEVOTHYROXINE SODIUM 125 MCG PO TABS: 125.0000 ug | ORAL_TABLET | Freq: Every day | ORAL | 1 refills | Status: DC

## 2022-03-31 MED ORDER — SERTRALINE HCL 50 MG PO TABS: 100.0000 mg | ORAL_TABLET | Freq: Every day | ORAL | 1 refills | Status: DC

## 2022-03-31 NOTE — Assessment & Plan Note (Signed)
Advair and albuterol refilled.

## 2022-03-31 NOTE — Assessment & Plan Note (Signed)
Diet and exercise counseling provided. She is interested in weight management referral. Referral placed.

## 2022-03-31 NOTE — Progress Notes (Signed)
Subjective:   Andrea [name] is a 67 y.o. female who presents for an Initial Medicare Annual Wellness Visit.   BP:  She could not remember the name of her BP meds and was uncertain if she was taking it accurately. She did not come in with her medication bottles.  Restrictive lung dx:  She sometimes feels short-winded with ambulation. She uses albuterol prn but is not adherent to her Advair.  Hypothyroidism: Here for follow-up. She has been out of her Synthroid for one week  Hearing loss: She failed her hearing test today. She endorsed issues with hearing for months. No earache or discharge.  Depression; Need Zoloft refilled.   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 She endorses intermittent pain in the area. Her last episode was yesterday.  Fall/Morbid obesity: Hx of fall in 2023 with fracture s/p surgery. No recent falls. She was doing water aerobics prior to her fall and she hopes to get back to doing it soon.    Review of Systems    PMHx reviewed       Objective:    Today's Vitals   03/31/22 1059 03/31/22 1109  BP: (!) 151/117 (!) 156/111  Pulse: 86   SpO2: 98%   Weight: 281 lb (127.5 kg)   Height: '5\' 3"'$  (1.6 m)   PainSc: 0-No pain 0-No pain   Body mass index is 49.78 kg/m. Physical Exam Vitals and nursing note reviewed.  Cardiovascular:     Rate and Rhythm: Normal rate and regular rhythm.     Heart sounds: Normal heart sounds. No murmur heard. Neurological:     General: No focal deficit present.     Mental Status: She is oriented to person, place, and time.        03/09/2022    2:38 PM 08/19/2021   11:07 AM 07/19/2021    1:52 PM 07/12/2021    9:08 AM 05/14/2021   10:35 PM 05/14/2021    2:02 PM 04/22/2021   11:06 AM  Advanced Directives  Does Patient Have a Medical Advance Directive? No No No No No No No  Would patient like information on creating a medical advance directive? No - Patient declined No - Patient  declined No - Patient declined No - Patient declined No - Patient declined  No - Patient declined    Current Medications (verified) Outpatient Encounter Medications as of 03/31/2022  Medication Sig   carvedilol (COREG) 6.25 MG tablet TAKE 1 TABLET BY MOUTH TWICE DAILY WITH A MEAL   famotidine (PEPCID) 20 MG tablet Take 1 tablet (20 mg total) by mouth daily.   ferrous sulfate 325 (65 FE) MG tablet Take 1 tablet (325 mg total) by mouth daily with breakfast.   levothyroxine (SYNTHROID) 125 MCG tablet Take 1 tablet (125 mcg total) by mouth daily before breakfast.   Omega-3 Fatty Acids (FISH OIL) 1000 MG CAPS Take 1 capsule (1,000 mg total) by mouth in the morning and at bedtime. (Patient taking differently: Take 1,000 mg by mouth daily.)   sertraline (ZOLOFT) 50 MG tablet Take 2 tablets by mouth once daily   simvastatin (ZOCOR) 40 MG tablet TAKE 1 TABLET BY MOUTH AT BEDTIME   Vitamin D, Cholecalciferol, 50 MCG (2000 UT) CAPS Take 2,000 Units by mouth daily.   albuterol (VENTOLIN HFA) 108 (90 Base) MCG/ACT inhaler Inhale 2 puffs into the lungs every 6 (six) hours as needed for wheezing or shortness of breath. (Patient not taking: Reported on 08/19/2021)  fluticasone-salmeterol (ADVAIR HFA) 115-21 MCG/ACT inhaler Inhale 2 puffs into the lungs 2 (two) times daily. (Patient not taking: Reported on 03/31/2022)   loratadine (CLARITIN) 10 MG tablet Take 1 tablet (10 mg total) by mouth daily.   methocarbamol (ROBAXIN) 500 MG tablet Take 1 tablet (500 mg total) by mouth every 6 (six) hours as needed for muscle spasms. (Patient not taking: Reported on 07/12/2021)   naproxen (NAPROSYN) 500 MG tablet Take 1 tablet (500 mg total) by mouth 2 (two) times daily as needed. (Patient not taking: Reported on 03/31/2022)   polyethylene glycol (MIRALAX / GLYCOLAX) 17 g packet Take 17 g by mouth 2 (two) times daily. (Patient not taking: Reported on 07/12/2021)   [DISCONTINUED] oxyCODONE (OXY IR/ROXICODONE) 5 MG immediate release  tablet Take 1 tablet (5 mg total) by mouth every 6 (six) hours as needed for severe pain. (Patient not taking: Reported on 03/31/2022)   No facility-administered encounter medications on file as of 03/31/2022.    Allergies (verified) Patient has no known allergies.   History: Past Medical History:  Diagnosis Date   Abnormal mammogram 08/28/2014   08/28/14 - Probable benign microcalcifications over the outer mid to lower left breast. Bi-Rads 3. F/U 6 months    Allergic rhinitis 06/22/2014   Arm pain, lateral, left 04/28/2016   Asthma    Carpal tunnel syndrome 04/26/2006   Qualifier: Diagnosis of  By: Dorathy Daft MD, Aaron     Femur fracture [facility]) 05/14/2021   Femur fracture, right ([place]) 05/14/2021   First degree AV block 09/22/2013   EKG 09/22/13    GANGLION CYST, WRIST, RIGHT 10/06/2008   Qualifier: Diagnosis of  By: Drue Flirt  MD, Taineisha     Hypothyroidism    Imbalance 01/12/2017   Palpitations 09/22/2013   Right wrist pain 04/28/2016   Past Surgical History:  Procedure Laterality Date   ABDOMINAL HYSTERECTOMY     fibroids   BREAST BIOPSY Left 01/24/2022   MM LT BREAST BX W LOC DEV 1ST LESION IMAGE BX SPEC STEREO GUIDE 01/24/2022 [facility]   BREAST BIOPSY  03/15/2022   MM LT RADIOACTIVE SEED LOC MAMMO GUIDE 03/15/2022 [facility]   BREAST LUMPECTOMY WITH RADIOACTIVE SEED LOCALIZATION Left 03/16/2022   Procedure: LEFT BREAST LUMPECTOMY WITH RADIOACTIVE SEED LOCALIZATION;  Surgeon: Erroll Luna, MD;  Location: [facility];  Service: General;  Laterality: Left;   FEMUR IM NAIL Right 05/14/2021   Procedure: INTRAMEDULLARY (IM) RETROGRADE FEMORAL NAILING;  Surgeon: Phylliss Bob, MD;  Location: [facility];  Service: Orthopedics;  Laterality: Right;   Family History  Problem Relation Age of Onset   Cancer Mother        cervial, pituitary, bone?   Hypertension Mother    Cancer Father    Alcohol abuse Father    Hypertension Sister    Hypothyroidism Sister     Cancer Brother        lung   Hypertension Sister    Hypothyroidism Sister    Hypertension Sister    Hypothyroidism Sister    Hypertension Sister    Hypothyroidism Sister    Hypertension Sister    Hypothyroidism Sister    Social History   Socioeconomic History   Marital status: Single    Spouse name: Not on file   Number of children: Not on file   Years of education: Not on file   Highest education level: Not on file  Occupational History   Not on file  Tobacco Use   Smoking status: Never  Smokeless tobacco: Never   Tobacco comments:    Tobacco smoke comes in through the house vent from outside  Vaping Use   Vaping Use: Never used  Substance and Sexual Activity   Alcohol use: No   Drug use: No   Sexual activity: Never    Birth control/protection: Surgical  Other Topics Concern   Not on file  Social History Narrative   Works for [employer], 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[employer], concessions   Social Determinants of Health   Financial Resource Strain: Medium Risk (01/06/2019)   Overall Financial Resource Strain (CARDIA)    Difficulty of Paying Living Expenses: Somewhat hard  Food Insecurity: Food Insecurity Present (03/31/2022)   Hunger Vital Sign    Worried About [place] in the Last Year: Often true    Ran Out of Food in the Last Year: Sometimes true  Transportation Needs: No Transportation Needs (03/31/2022)   PRAPARE - Hydrologist (Medical): No    Lack of Transportation (Non-Medical): No  Physical Activity: Inactive (03/31/2022)   Exercise Vital Sign    Days of Exercise per Week: 0 days    Minutes of Exercise per Session: 0 min  Stress: Not on file  Social Connections: Not on file    Tobacco Counseling Counseling given: Not Answered Tobacco comments: Tobacco smoke comes in through the house vent from outside   Clinical Intake:  Pre-visit preparation completed: Yes  Pain : No/denies pain Pain  Score: 0-No pain     BMI - recorded: 49.8 Nutritional Status: BMI > 30  Obese Diabetes: No  How often do you need to have someone help you when you read instructions, pamphlets, or other written materials from your doctor or pharmacy?: 1 - Never What is the last grade level you completed in school?: 12th grade  Diabetic?No  Interpreter Needed?: No      Activities of Daily Living    03/16/2022   12:30 PM 05/14/2021   10:35 PM  In your present state of health, do you have any difficulty performing the following activities:  Hearing? 0 0  Vision? 0 0  Difficulty concentrating or making decisions? 0 0  Walking or climbing stairs? 0 0  Dressing or bathing? 0 0  Doing errands, shopping?  0    Patient Care Team: [name], MD as PCP - General (Family Medicine)  Indicate any recent Medical Services you may have received from other than [facility] providers in the past year (date may be approximate).     Assessment:   This is a routine wellness examination for Kynadi.  Hearing/Vision screen Hearing Screening   '250Hz'$  '500Hz'$  '1000Hz'$  '2000Hz'$  '4000Hz'$   Right ear 20 40 20 25 40  Left ear Fail 25 Fail 25 Fail   Vision Screening   Right eye Left eye Both eyes  Without correction '20/20 20/20 20/20 '$  With correction       Dietary issues and exercise activities discussed: Current Exercise Habits: The patient does not participate in regular exercise at present, Exercise limited by: orthopedic condition(s)   Goals Addressed               This Visit's Progress     Track and Manage My Blood Pressure-Hypertension        Timeframe:  Long-Range Goal Priority:  High Start Date:  03/31/2022  Expected End Date:  On going                     Follow Up Date 04/29/2022    - check blood pressure daily    Why is this important?   You won't feel high blood pressure, but it can still hurt your blood vessels.  High blood pressure can cause heart or kidney  problems. It can also cause a stroke.  Making lifestyle changes like losing a little weight or eating less salt will help.  Checking your blood pressure at home and at different times of the day can help to control blood pressure.  If the doctor prescribes medicine remember to take it the way the doctor ordered.  Call the office if you cannot afford the medicine or if there are questions about it.     Notes:       Weight (lb) < 200 lb (90.7 kg) (pt-stated)   281 lb (127.5 kg)     I want to be referred to weight loss clinic       Depression Screen    03/31/2022   11:02 AM 08/19/2021   11:06 AM 07/19/2021    1:52 PM 07/12/2021    9:09 AM 04/22/2021   11:05 AM 04/08/2021   11:27 AM 03/25/2021    9:51 AM  PHQ 2/9 Scores  PHQ - 2 Score '3 1  2 1 1 2  '$ PHQ- 9 Score '11 1   7 5 5  '$ Exception Documentation   Patient refusal        Fall Risk    03/31/2022   11:26 AM 03/31/2022   11:02 AM 08/19/2021   11:07 AM 07/12/2021    9:08 AM 04/22/2021   11:05 AM  Fall Risk   Falls in the past year? 1 0 0 1 0  Number falls in past yr: 0 0 0 0 0  Injury with Fall? 1 0 0 1 0  Comment She had a fracture which was treated      Risk for fall due to : Orthopedic patient;Impaired balance/gait      Follow up Falls prevention discussed        FALL RISK PREVENTION PERTAINING TO THE HOME:  Any stairs in or around the home? Yes  If so, are there any without handrails? Yes   Home free of loose throw rugs in walkways, pet beds, electrical cords, etc? Yes  Adequate lighting in your home to reduce risk of falls? Yes   ASSISTIVE DEVICES UTILIZED TO PREVENT FALLS:  Life alert? Yes  Use of a cane, walker or w/c? No  Grab bars in the bathroom? No  Shower chair or bench in shower? Yes  Elevated toilet seat or a handicapped toilet? No   TIMED UP AND GO:  Was the test performed? Yes .  Length of time to ambulate 10 feet: 13 sec.   Gait slow and steady without use of assistive device  Cognitive Function:         03/31/2022   11:24 AM  6CIT Screen  What Year? 0 points  What month? 0 points  What time? 0 points  Count back from 20 0 points  Months in reverse 0 points  Repeat phrase 0 points  Total Score 0 points    Immunizations Immunization History  Administered Date(s) Administered   DTP 05/30/2006   Influenza Split 02/03/2011   Influenza,inj,Quad PF,6+ Mos 12/23/2012, 04/06/2014, 01/07/2016, 01/12/2017, 04/08/2019, 10/28/2019, 03/25/2021  PFIZER Comirnaty(Gray Top)Covid-19 Tri-Sucrose Vaccine 08/03/2020   PFIZER(Purple Top)SARS-COV-2 Vaccination 10/28/2019, 11/25/2019   PNEUMOCOCCAL CONJUGATE-20 08/03/2020   Pfizer Covid-19 Vaccine Bivalent Booster 82yr & up 03/25/2021   Td 11/27/1992, 05/30/2006   Tdap 07/13/2016   Zoster Recombinat (Shingrix) 08/18/2020    TDAP status: Up to date  Flu Vaccine status: Declined, Education has been provided regarding the importance of this vaccine but patient still declined. Advised may receive this vaccine at local pharmacy or Health Dept. Aware to provide a copy of the vaccination record if obtained from local pharmacy or Health Dept. Verbalized acceptance and understanding. She will got her flu shot and Covid shot at the pharmacy she said. Pneumococcal vaccine status: Up to date  Covid-19 vaccine status: Declined, Education has been provided regarding the importance of this vaccine but patient still declined. Advised may receive this vaccine at local pharmacy or Health Dept.or vaccine clinic. Aware to provide a copy of the vaccination record if obtained from local pharmacy or Health Dept. Verbalized acceptance and understanding.  Qualifies for Shingles Vaccine? Yes   Zostavax completed No   Shingrix Completed?: No.    Education has been provided regarding the importance of this vaccine. Patient has been advised to call insurance company to determine out of pocket expense if they have not yet received this vaccine. Advised may also receive  vaccine at local pharmacy or Health Dept. Verbalized acceptance and understanding.  Screening Tests Health Maintenance  Topic Date Due   Medicare Annual Wellness (AWV)  Never done   Zoster Vaccines- Shingrix (2 of 2) 10/13/2020   COVID-19 Vaccine (5 - 2023-24 season) 03/31/2023 (Originally 10/28/2021)   INFLUENZA VACCINE  04/28/2023 (Originally 09/27/2021)   Fecal DNA (Cologuard)  02/03/2023   MAMMOGRAM  01/05/2024   DTaP/Tdap/Td (5 - Td or Tdap) 07/14/2026   Pneumonia Vaccine 67 Years old  Completed   DEXA SCAN  Completed   Hepatitis C Screening  Completed   HPV VACCINES  Aged Out   COLONOSCOPY (Pts 45-494yrInsurance coverage will need to be confirmed)  Discontinued    Health Maintenance  Health Maintenance Due  Topic Date Due   Medicare Annual Wellness (AWV)  Never done   Zoster Vaccines- Shingrix (2 of 2) 10/13/2020    Colorectal cancer screening: Type of screening: Cologuard. Completed 2021. Repeat every 3 years  She is okay with GI referral now.   Mammogram status: Completed 2023. Repeat every year  Bone Density status: Completed 2023. Results reflect: Bone density results: OSTEOPENIA. Repeat every 3 years. Continue Vit D and calcium supplements  Lung Cancer Screening: (Low Dose CT Chest recommended if Age 66-80ears, 30 pack-year currently smoking OR have quit w/in 15years.) does not qualify.   Lung Cancer Screening Referral: N/A  Additional Screening:  Hepatitis C Screening: does qualify; Completed 2017  Vision Screening: Recommended annual ophthalmology exams for early detection of glaucoma and other disorders of the eye. Is the patient up to date with their annual eye exam?  Yes  Who is the provider or what is the name of the office in which the patient attends annual eye exams? Unknown If pt is not established with a provider, would they like to be referred to a provider to establish care? She will contact her insurance for referral.   Dental Screening:  Recommended annual dental exams for proper oral hygiene  Community Resource Referral / Chronic Care Management: CRR required this visit?  No   CCM required this visit?  No  Plan:     I have personally reviewed and noted the following in the patient's chart:   Medical and social history Use of alcohol, tobacco or illicit drugs  Current medications and supplements including opioid prescriptions. Patient is not currently taking opioid prescriptions. Functional ability and status Nutritional status Physical activity Advanced directives List of other physicians Hospitalizations, surgeries, and ER visits in previous 12 months Vitals Screenings to include cognitive, depression, and falls Referrals and appointments  In addition, I have reviewed and discussed with patient certain preventive protocols, quality metrics, and best practice recommendations. A written personalized care plan for preventive services as well as general preventive health recommendations were provided to patient. Advance directive discussed and I provided her with the form to complete.     Other chronic problems addressed HTN:  BP uncontrolled. Uncertainty regarding medication adherence. She will return in 4 weeks will all home medications. We will adjust her meds as needed then.  She agreed with the plan.   Restrictive lung dx/Hypothyroidism/MDD; Advair and albuterol refilled.  Synthroid refilled Zoloft refilled. She declined referral to Psych at this time for her depression as she is coping well.   Morbid Obesity: Diet and exercise counseling provided. She is interested in weight management referral. Referral placed.   Fall/Failed hearing: PT referral declined. ENT referral discussed.  [name], MD   03/31/2022

## 2022-03-31 NOTE — Assessment & Plan Note (Signed)
Zoloft refilled. She declined referral to Psych at this time.

## 2022-03-31 NOTE — Patient Instructions (Signed)

## 2022-03-31 NOTE — Assessment & Plan Note (Signed)
Synthroid refilled.

## 2022-03-31 NOTE — Assessment & Plan Note (Signed)
BP uncontrolled. Uncertainty regarding medication adherence. She will return in 4 weeks will all home medications. We will adjust her meds as needed then.  She agreed with the plan.

## 2022-04-12 ENCOUNTER — Encounter: Payer: Self-pay | Admitting: Gastroenterology

## 2022-04-12 ENCOUNTER — Telehealth: Payer: Self-pay | Admitting: Hematology and Oncology

## 2022-04-12 DIAGNOSIS — J984 Other disorders of lung: Secondary | ICD-10-CM | POA: Diagnosis not present

## 2022-04-12 DIAGNOSIS — N182 Chronic kidney disease, stage 2 (mild): Secondary | ICD-10-CM | POA: Diagnosis not present

## 2022-04-12 DIAGNOSIS — M6281 Muscle weakness (generalized): Secondary | ICD-10-CM | POA: Diagnosis not present

## 2022-04-12 DIAGNOSIS — S7291XB Unspecified fracture of right femur, initial encounter for open fracture type I or II: Secondary | ICD-10-CM | POA: Diagnosis not present

## 2022-04-12 NOTE — Telephone Encounter (Signed)
Scheduled appt per 2/14 referral. Pt is aware of appt date and time. Pt is aware to arrive 15 mins prior to appt time and to bring and updated insurance card. Pt is aware of appt location.

## 2022-04-17 ENCOUNTER — Telehealth: Payer: Self-pay | Admitting: Hematology and Oncology

## 2022-04-17 ENCOUNTER — Other Ambulatory Visit: Payer: Self-pay | Admitting: *Deleted

## 2022-04-17 ENCOUNTER — Telehealth: Payer: Self-pay | Admitting: *Deleted

## 2022-04-17 DIAGNOSIS — Z9889 Other specified postprocedural states: Secondary | ICD-10-CM

## 2022-04-17 NOTE — Progress Notes (Signed)
Radiation Oncology         ([phone]) [phone] ________________________________  Name: Andrea [name]        MRN: [id number]  Date of Service: 04/18/2022 DOB: 17-Dec-1955  [id number], [name], MD  Erroll Luna, MD     REFERRING PHYSICIAN: Erroll Luna, MD   DIAGNOSIS: There were no encounter diagnoses.   HISTORY OF PRESENT ILLNESS: Andrea [name] is a 67 y.o. female seen at the request of Dr. Brantley Stage for a newly diagnosed left breast cancer. The patient was seen for screening mammography in November 2023.  This detected normal breast tissue on the right side but did show calcification and a possible mass in the left breast.  Further diagnostic imaging showed an approximately 4 mm mass by mammography containing 9 layering calcifications but by ultrasound this could not be seen as a correlate.  Her axilla was negative for adenopathy.  She underwent biopsy on 01/24/2022 which showed atypical ductal hyperplasia involving a papillary lesion.  She was counseled on the rationale for lumpectomy procedure with Dr. Brantley Stage.  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 Her prognostic panel showed ER and PR positivity.  She is seen to discuss adjuvant therapy for her cancer.    PREVIOUS RADIATION THERAPY: No   PAST MEDICAL HISTORY:  Past Medical History:  Diagnosis Date   Abnormal mammogram 08/28/2014   08/28/14 - Probable benign microcalcifications over the outer mid to lower left breast. Bi-Rads 3. F/U 6 months    Allergic rhinitis 06/22/2014   Arm pain, lateral, left 04/28/2016   Asthma    Carpal tunnel syndrome 04/26/2006   Qualifier: Diagnosis of  By: Dorathy Daft MD, Aaron     Femur fracture [facility]) 05/14/2021   Femur fracture, right ([place]) 05/14/2021   First degree AV block 09/22/2013   EKG 09/22/13    GANGLION CYST, WRIST, RIGHT  10/06/2008   Qualifier: Diagnosis of  By: Drue Flirt  MD, Taineisha     Hypothyroidism    Imbalance 01/12/2017   Palpitations 09/22/2013   Right wrist pain 04/28/2016       PAST SURGICAL HISTORY: Past Surgical History:  Procedure Laterality Date   ABDOMINAL HYSTERECTOMY     fibroids   BREAST BIOPSY Left 01/24/2022   MM LT BREAST BX W LOC DEV 1ST LESION IMAGE BX SPEC STEREO GUIDE 01/24/2022 [facility]   BREAST BIOPSY  03/15/2022   MM LT RADIOACTIVE SEED LOC MAMMO GUIDE 03/15/2022 [facility]   BREAST LUMPECTOMY WITH RADIOACTIVE SEED LOCALIZATION Left 03/16/2022   Procedure: LEFT BREAST LUMPECTOMY WITH RADIOACTIVE SEED LOCALIZATION;  Surgeon: Erroll Luna, MD;  Location: [facility];  Service: General;  Laterality: Left;   FEMUR IM NAIL Right 05/14/2021   Procedure: INTRAMEDULLARY (IM) RETROGRADE FEMORAL NAILING;  Surgeon: Phylliss Bob, MD;  [facility];  Service: Orthopedics;  Laterality: Right;     FAMILY HISTORY:  Family History  Problem Relation Age of Onset   Cancer Mother        cervial, pituitary, bone?   Hypertension Mother    Cancer Father    Alcohol abuse Father    Hypertension Sister    Hypothyroidism Sister    Cancer Brother        lung   Hypertension Sister    Hypothyroidism Sister    Hypertension Sister    Hypothyroidism Sister  Hypertension Sister    Hypothyroidism Sister    Hypertension Sister    Hypothyroidism Sister      SOCIAL HISTORY:  reports that she has never smoked. She has never used smokeless tobacco. She reports that she does not drink alcohol and does not use drugs.  The patient is single and lives in [place].  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   ALLERGIES: Patient has no known allergies.   MEDICATIONS:  Current Outpatient Medications  Medication Sig Dispense Refill   albuterol (VENTOLIN HFA) 108 (90 Base) MCG/ACT inhaler Inhale 2 puffs into  the lungs every 6 (six) hours as needed for wheezing or shortness of breath. 18 g 1   carvedilol (COREG) 6.25 MG tablet TAKE 1 TABLET BY MOUTH TWICE DAILY WITH A MEAL 180 tablet 1   famotidine (PEPCID) 20 MG tablet Take 1 tablet (20 mg total) by mouth daily. 90 tablet 1   ferrous sulfate 325 (65 FE) MG tablet Take 1 tablet (325 mg total) by mouth daily with breakfast. 30 tablet 3   fluticasone-salmeterol (ADVAIR HFA) 115-21 MCG/ACT inhaler Inhale 2 puffs into the lungs 2 (two) times daily. 1 each 12   levothyroxine (SYNTHROID) 125 MCG tablet Take 1 tablet (125 mcg total) by mouth daily before breakfast. 90 tablet 1   loratadine (CLARITIN) 10 MG tablet Take 1 tablet (10 mg total) by mouth daily. 90 tablet 2   methocarbamol (ROBAXIN) 500 MG tablet Take 1 tablet (500 mg total) by mouth every 6 (six) hours as needed for muscle spasms. (Patient not taking: Reported on 07/12/2021) 30 tablet 0   Omega-3 Fatty Acids (FISH OIL) 1000 MG CAPS Take 1 capsule (1,000 mg total) by mouth in the morning and at bedtime. (Patient taking differently: Take 1,000 mg by mouth daily.) 60 capsule PRN   polyethylene glycol (MIRALAX / GLYCOLAX) 17 g packet Take 17 g by mouth 2 (two) times daily. (Patient not taking: Reported on 07/12/2021) 14 each 0   sertraline (ZOLOFT) 50 MG tablet Take 2 tablets (100 mg total) by mouth daily. 180 tablet 1   simvastatin (ZOCOR) 40 MG tablet TAKE 1 TABLET BY MOUTH AT BEDTIME 90 tablet 1   Vitamin D, Cholecalciferol, 50 MCG (2000 UT) CAPS Take 2,000 Units by mouth daily.     No current facility-administered medications for this visit.     REVIEW OF SYSTEMS: On review of systems, the patient reports that she is doing pretty well since surgery. She has occasional tenderness in her surgical site. No other complaints of difficulty with range of motion are noted.     PHYSICAL EXAM:  Wt Readings from Last 3 Encounters:  03/31/22 281 lb (127.5 kg)  03/16/22 265 lb 6.9 oz (120.4 kg)  08/19/21  264 lb (119.7 kg)   Temp Readings from Last 3 Encounters:  03/16/22 (!) 97.5 F (36.4 C)  05/21/21 98.5 F (36.9 C) (Oral)  12/19/18 98.6 F (37 C) (Oral)   BP Readings from Last 3 Encounters:  03/31/22 (!) 156/111  03/16/22 122/80  08/19/21 107/83   Pulse Readings from Last 3 Encounters:  03/31/22 86  03/16/22 64  08/19/21 71    In general this is a well appearing African-American female in no acute distress. She's alert and oriented x4 and appropriate throughout the examination. Cardiopulmonary assessment is negative for acute distress and she exhibits normal effort. Bilateral breast exam is deferred.    ECOG = 1  0 - Asymptomatic (Fully active, able to carry on all predisease activities without restriction)  1 - Symptomatic but completely ambulatory (Restricted in physically strenuous activity but ambulatory and able to carry out work of a light or sedentary nature. For example, light housework, office work)  2 - Symptomatic, <50% in bed during the day (Ambulatory and capable of all self care but unable to carry out any work activities. Up and about more than 50% of waking hours)  3 - Symptomatic, >50% in bed, but not bedbound (Capable of only limited self-care, confined to bed or chair 50% or more of waking hours)  4 - Bedbound (Completely disabled. Cannot carry on any self-care. Totally confined to bed or chair)  5 - Death   Eustace Pen MM, Creech RH, Tormey DC, et al. [phone]). "Toxicity and response criteria of the [facility]". [place] Oncol. 5 (6): 649-55    LABORATORY DATA:  Lab Results  Component Value Date   WBC 9.2 05/21/2021   HGB 10.9 (A) 07/12/2021   HCT 26.9 (L) 05/21/2021   MCV 92.1 05/21/2021   PLT 225 05/21/2021   Lab Results  Component Value Date   NA 132 (L) 05/21/2021   K 4.0 05/21/2021   CL 100 05/21/2021   CO2 27 05/21/2021   Lab Results  Component Value Date   ALT 28 10/28/2019   AST 42 (H) 10/28/2019    ALKPHOS 91 10/28/2019   BILITOT 0.3 10/28/2019      RADIOGRAPHY: No results found.     IMPRESSION/PLAN: 1. Intermediate grade, ER/PR positive DCIS of the left breast. Dr. Lisbeth Renshaw discusses the pathology findings and reviews the nature of early stage breast disease.  She has done well since her surgery.  We discussed that she would also be meeting with Dr. Chryl Heck in medical oncology. Dr. Lisbeth Renshaw recommends external radiotherapy to the breast  to reduce risks of local recurrence followed by antiestrogen therapy. We discussed the risks, benefits, short, and long term effects of radiotherapy, as well as the curative intent. Dr. Lisbeth Renshaw discusses the delivery and logistics of radiotherapy and recommends 4 weeks of radiotherapy to the left breast with deep inspiration breath-hold technique. She is not quite sure how she'd like to proceed. We will check in with her next week and see how she'd like to proceed. If she is interested in proceeding, she will simulate on 04/27/22. 2. Possible genetic predisposition to malignancy. The patient is a candidate for genetic testing given her personal and family history. She was offered referral and will be contacted for consultation.    In a visit lasting 60 minutes, greater than 50% of the time was spent face to face reviewing her case, as well as in preparation of, discussing, and coordinating the patient's care.  The above documentation reflects my direct findings during this shared patient visit. Please see the separate note by Dr. Lisbeth Renshaw on this date for the remainder of the patient's plan of care.    Carola Rhine, [facility]    **Disclaimer: This note was dictated with voice recognition software. Similar sounding words can inadvertently be transcribed and this note may contain transcription errors which may not have been corrected upon publication of note.**

## 2022-04-17 NOTE — Telephone Encounter (Signed)
Nurse stated patient aware of lab appointment per secure chat

## 2022-04-17 NOTE — Progress Notes (Signed)
New Breast Cancer Diagnosis: Left Breast  Did patient present with symptoms (if so, please note symptoms) or screening mammography?: Screening mammogram done in November 2023 showed left breast calcifications and possible mass.    Location and Extent of disease :left breast, measured 1.5 mm in greatest dimension. Adenopathy no.  Staging: pTis, pN not assigned   Histology per Pathology Report: grade 2, DCIS, 03/16/2022  Receptor Status: ER(positive), PR (positive), Her2-neu (), Ki-(%)  Left Breast Biopsy 01/24/2022 Atypical Ductal Hyperplasia involving a papillary lesion.   Surgeon and surgical plan, if any:  Dr. Brantley Stage -Left Breast Lumpectomy with radioactive seed localization 03/16/2022  Medical oncologist, treatment if any:    Family History of Breast/Ovarian/Prostate Cancer:   Lymphedema issues, if any:      Pain issues, if any:     SAFETY ISSUES: Prior radiation?  Pacemaker/ICD?  Possible current pregnancy? Is the patient on methotrexate?   Current Complaints / other details:

## 2022-04-17 NOTE — Telephone Encounter (Signed)
Called patient's sister-Carolyn Jimmye Norman to inform of her lab tomorrow (04/18/22) @ 12:15 pm, spoke with patient's sister- Emelda Fear

## 2022-04-18 ENCOUNTER — Encounter: Payer: Self-pay | Admitting: Radiation Oncology

## 2022-04-18 ENCOUNTER — Inpatient Hospital Stay: Payer: 59 | Attending: Hematology and Oncology

## 2022-04-18 ENCOUNTER — Other Ambulatory Visit: Payer: Self-pay

## 2022-04-18 ENCOUNTER — Ambulatory Visit
Admission: RE | Admit: 2022-04-18 | Discharge: 2022-04-18 | Disposition: A | Discharge status: Home / Self Care | Payer: 59 | Source: Ambulatory Visit | Attending: Radiation Oncology | Admitting: Radiation Oncology

## 2022-04-18 ENCOUNTER — Encounter (HOSPITAL_COMMUNITY): Payer: Self-pay

## 2022-04-18 VITALS — BP 183/115 | HR 72 | Temp 96.7°F | Resp 20 | Ht 63.0 in | Wt 283.2 lb

## 2022-04-18 DIAGNOSIS — M8580 Other specified disorders of bone density and structure, unspecified site: Secondary | ICD-10-CM | POA: Insufficient documentation

## 2022-04-18 DIAGNOSIS — J45909 Unspecified asthma, uncomplicated: Secondary | ICD-10-CM | POA: Insufficient documentation

## 2022-04-18 DIAGNOSIS — D0512 Intraductal carcinoma in situ of left breast: Secondary | ICD-10-CM

## 2022-04-18 DIAGNOSIS — Z170 Estrogen receptor positive status [ER+]: Secondary | ICD-10-CM | POA: Insufficient documentation

## 2022-04-18 DIAGNOSIS — Z79890 Hormone replacement therapy (postmenopausal): Secondary | ICD-10-CM | POA: Insufficient documentation

## 2022-04-18 DIAGNOSIS — Z9889 Other specified postprocedural states: Secondary | ICD-10-CM

## 2022-04-18 DIAGNOSIS — Z79899 Other long term (current) drug therapy: Secondary | ICD-10-CM | POA: Diagnosis not present

## 2022-04-18 DIAGNOSIS — Z7951 Long term (current) use of inhaled steroids: Secondary | ICD-10-CM | POA: Diagnosis not present

## 2022-04-18 DIAGNOSIS — Z809 Family history of malignant neoplasm, unspecified: Secondary | ICD-10-CM

## 2022-04-18 LAB — CBC WITH DIFFERENTIAL (CANCER CENTER ONLY)
Abs Immature Granulocytes: 0.02 10*3/uL (ref 0.00–0.07)
Basophils Absolute: 0 10*3/uL (ref 0.0–0.1)
Basophils Relative: 0 %
Eosinophils Absolute: 0 10*3/uL (ref 0.0–0.5)
Eosinophils Relative: 1 %
HCT: 37.9 % (ref 36.0–46.0)
Hemoglobin: 12.4 g/dL (ref 12.0–15.0)
Immature Granulocytes: 0 %
Lymphocytes Relative: 40 %
Lymphs Abs: 2.3 10*3/uL (ref 0.7–4.0)
MCH: 29.6 pg (ref 26.0–34.0)
MCHC: 32.7 g/dL (ref 30.0–36.0)
MCV: 90.5 fL (ref 80.0–100.0)
Monocytes Absolute: 0.6 10*3/uL (ref 0.1–1.0)
Monocytes Relative: 10 %
Neutro Abs: 2.8 10*3/uL (ref 1.7–7.7)
Neutrophils Relative %: 49 %
Platelet Count: 199 10*3/uL (ref 150–400)
RBC: 4.19 MIL/uL (ref 3.87–5.11)
RDW: 14.4 % (ref 11.5–15.5)
WBC Count: 5.7 10*3/uL (ref 4.0–10.5)
nRBC: 0 % (ref 0.0–0.2)

## 2022-04-18 LAB — CMP (CANCER CENTER ONLY)
ALT: 44 U/L (ref 0–44)
AST: 74 U/L — ABNORMAL HIGH (ref 15–41)
Albumin: 3.6 g/dL (ref 3.5–5.0)
Alkaline Phosphatase: 112 U/L (ref 38–126)
Anion gap: 6 (ref 5–15)
BUN: 17 mg/dL (ref 8–23)
CO2: 31 mmol/L (ref 22–32)
Calcium: 8.7 mg/dL — ABNORMAL LOW (ref 8.9–10.3)
Chloride: 102 mmol/L (ref 98–111)
Creatinine: 0.97 mg/dL (ref 0.44–1.00)
GFR, Estimated: >60 mL/min (ref >60)
Glucose, Bld: 97 mg/dL (ref 70–99)
Potassium: 3.6 mmol/L (ref 3.5–5.1)
Sodium: 139 mmol/L (ref 135–145)
Total Bilirubin: 0.5 mg/dL (ref 0.3–1.2)
Total Protein: 7.2 g/dL (ref 6.5–8.1)

## 2022-04-19 ENCOUNTER — Telehealth: Payer: Self-pay | Admitting: Family Medicine

## 2022-04-19 NOTE — Telephone Encounter (Signed)
Per 2/21 IB scheduled patient. Patient aware of date and time of appointment.

## 2022-04-24 ENCOUNTER — Telehealth: Payer: Self-pay | Admitting: Radiation Oncology

## 2022-04-24 NOTE — Telephone Encounter (Signed)
During the patient's last visit during consultation, she was unsure about if she was in agreement to proceed with adjuvant radiation to the breast. After including her sister [name] in the conversation, the patient is in agreement to keep her appointment scheduled for later this week.

## 2022-04-27 ENCOUNTER — Inpatient Hospital Stay (HOSPITAL_BASED_OUTPATIENT_CLINIC_OR_DEPARTMENT_OTHER): Payer: 59 | Admitting: Hematology and Oncology

## 2022-04-27 ENCOUNTER — Encounter: Payer: Self-pay | Admitting: Hematology and Oncology

## 2022-04-27 ENCOUNTER — Other Ambulatory Visit: Payer: Self-pay | Admitting: *Deleted

## 2022-04-27 ENCOUNTER — Ambulatory Visit
Admission: RE | Admit: 2022-04-27 | Discharge: 2022-04-27 | Disposition: A | Discharge status: Home / Self Care | Payer: 59 | Source: Ambulatory Visit | Attending: Radiation Oncology | Admitting: Radiation Oncology

## 2022-04-27 ENCOUNTER — Other Ambulatory Visit: Payer: Self-pay

## 2022-04-27 ENCOUNTER — Inpatient Hospital Stay: Payer: 59

## 2022-04-27 VITALS — BP 168/92 | HR 85 | Temp 97.7°F | Resp 18 | Ht 63.0 in | Wt 285.0 lb

## 2022-04-27 DIAGNOSIS — D0512 Intraductal carcinoma in situ of left breast: Secondary | ICD-10-CM | POA: Diagnosis not present

## 2022-04-27 DIAGNOSIS — D0510 Intraductal carcinoma in situ of unspecified breast: Secondary | ICD-10-CM | POA: Insufficient documentation

## 2022-04-27 DIAGNOSIS — Z170 Estrogen receptor positive status [ER+]: Secondary | ICD-10-CM | POA: Diagnosis not present

## 2022-04-27 DIAGNOSIS — Z510 Encounter for antineoplastic radiation therapy: Secondary | ICD-10-CM | POA: Diagnosis not present

## 2022-04-27 DIAGNOSIS — D649 Anemia, unspecified: Secondary | ICD-10-CM

## 2022-04-27 NOTE — Progress Notes (Signed)
[facility] CONSULT NOTE  Patient Care Team: [name], MD as PCP - General (Family Medicine)  CHIEF COMPLAINTS/PURPOSE OF CONSULTATION:  Newly diagnosed breast cancer  HISTORY OF PRESENTING ILLNESS:  Andrea [name] 67 y.o. female is here because of recent diagnosis of left   I reviewed her records extensively and collaborated the history with the patient.  SUMMARY OF ONCOLOGIC HISTORY: Oncology History  DCIS (ductal carcinoma in situ) of breast  01/04/2022 Mammogram   Mammogram showed possible mass with associated calcifications in the left breast warranting further investigation.  Left breast diagnostic mammogram confirmed abnormality.  Ultrasound showed no sonographic correlate to the left breast mass.  No axillary adenopathy   01/24/2022 Pathology Results   Left breast needle core biopsy showed atypical ductal hyperplasia involving a papillary lesion.   03/16/2022 Definitive Surgery   Patient underwent left breast lumpectomy which showed a small focus of DCIS, intermediate grade, 1.5 mm, previous procedure related changes including focal squamous metaplasia, resection margins negative for DCIS.  Prognostic show ER +95% strong staining PR +95% strong staining   04/27/2022 Initial Diagnosis   DCIS (ductal carcinoma in situ) of breast    This is a very pleasant 67 year old female patient with history of femur fracture after a fall, hypothyroidism, obesity referred to hematology for evaluation and recommendations regarding left breast DCIS.  She underwent left breast lumpectomy which shows small focus of DCIS, intermediate grade measuring 1.5 mm.  She is healing well from her surgery.  She lives with her grandson who is 9 years old.  She is retired from [employer].  Rest of the pertinent 10 point ROS reviewed and negative  MEDICAL HISTORY:  Past Medical History:  Diagnosis Date   Abnormal mammogram 08/28/2014   08/28/14 - Probable benign  microcalcifications over the outer mid to lower left breast. Bi-Rads 3. F/U 6 months    Allergic rhinitis 06/22/2014   Arm pain, lateral, left 04/28/2016   Asthma    Breast cancer ([place]) 03/16/2022   Carpal tunnel syndrome 04/26/2006   Qualifier: Diagnosis of  By: Dorathy Daft MD, Aaron     Femur fracture [facility]) 05/14/2021   Femur fracture, right ([place]) 05/14/2021   First degree AV block 09/22/2013   EKG 09/22/13    GANGLION CYST, WRIST, RIGHT 10/06/2008   Qualifier: Diagnosis of  By: Drue Flirt  MD, Taineisha     Hypothyroidism    Imbalance 01/12/2017   Palpitations 09/22/2013   Right wrist pain 04/28/2016    SURGICAL HISTORY: Past Surgical History:  Procedure Laterality Date   ABDOMINAL HYSTERECTOMY     fibroids   BREAST BIOPSY Left 01/24/2022   MM LT BREAST BX W LOC DEV 1ST LESION IMAGE BX SPEC STEREO GUIDE 01/24/2022 [facility]   BREAST BIOPSY  03/15/2022   MM LT RADIOACTIVE SEED LOC MAMMO GUIDE 03/15/2022 [facility]   BREAST LUMPECTOMY WITH RADIOACTIVE SEED LOCALIZATION Left 03/16/2022   Procedure: LEFT BREAST LUMPECTOMY WITH RADIOACTIVE SEED LOCALIZATION;  Surgeon: Erroll Luna, MD;  Location: [facility];  Service: General;  Laterality: Left;   FEMUR IM NAIL Right 05/14/2021   Procedure: INTRAMEDULLARY (IM) RETROGRADE FEMORAL NAILING;  Surgeon: Phylliss Bob, MD;  [facility];  Service: Orthopedics;  Laterality: Right;    SOCIAL HISTORY: Social History   Socioeconomic History   Marital status: Single    Spouse name: Not on file   Number of children: Not on file   Years of education: Not on file  Highest education level: Not on file  Occupational History   Not on file  Tobacco Use   Smoking status: Never   Smokeless tobacco: Never   Tobacco comments:    Tobacco smoke comes in through the house vent from outside  Vaping Use   Vaping Use: Never used  Substance and Sexual Activity   Alcohol use: No   Drug use: No   Sexual activity:  Never    Birth control/protection: Surgical  Other Topics Concern   Not on file  Social History Narrative   Works for [employer], 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[employer], concessions   Social Determinants of Health   Financial Resource Strain: Medium Risk (01/06/2019)   Overall Financial Resource Strain (CARDIA)    Difficulty of Paying Living Expenses: Somewhat hard  Food Insecurity: Food Insecurity Present (03/31/2022)   Hunger Vital Sign    Worried About [place] in the Last Year: Often true    [place] in the Last Year: Sometimes true  Transportation Needs: No Transportation Needs (03/31/2022)   PRAPARE - Hydrologist (Medical): No    Lack of Transportation (Non-Medical): No  Physical Activity: Inactive (03/31/2022)   Exercise Vital Sign    Days of Exercise per Week: 0 days    Minutes of Exercise per Session: 0 min  Stress: Not on file  Social Connections: Not on file  Intimate Partner Violence: Not on file    FAMILY HISTORY: Family History  Problem Relation Age of Onset   Cancer Mother        cervial, pituitary, bone?   Hypertension Mother    Cancer Father    Alcohol abuse Father    Hypertension Sister    Hypothyroidism Sister    Hypertension Sister    Hypothyroidism Sister    Hypertension Sister    Hypothyroidism Sister    Hypertension Sister    Hypothyroidism Sister    Hypertension Sister    Hypothyroidism Sister    Cancer Brother        lung   Ovarian cancer Maternal Aunt     ALLERGIES:  has No Known Allergies.  MEDICATIONS:  Current Outpatient Medications  Medication Sig Dispense Refill   albuterol (VENTOLIN HFA) 108 (90 Base) MCG/ACT inhaler Inhale 2 puffs into the lungs every 6 (six) hours as needed for wheezing or shortness of breath. 18 g 1   carvedilol (COREG) 6.25 MG tablet TAKE 1 TABLET BY MOUTH TWICE DAILY WITH A MEAL 180 tablet 1   famotidine (PEPCID) 20 MG tablet Take 1 tablet (20 mg  total) by mouth daily. (Patient not taking: Reported on 04/18/2022) 90 tablet 1   ferrous sulfate 325 (65 FE) MG tablet Take 1 tablet (325 mg total) by mouth daily with breakfast. 30 tablet 3   fluticasone-salmeterol (ADVAIR HFA) 115-21 MCG/ACT inhaler Inhale 2 puffs into the lungs 2 (two) times daily. 1 each 12   levothyroxine (SYNTHROID) 125 MCG tablet Take 1 tablet (125 mcg total) by mouth daily before breakfast. 90 tablet 1   loratadine (CLARITIN) 10 MG tablet Take 1 tablet (10 mg total) by mouth daily. 90 tablet 2   methocarbamol (ROBAXIN) 500 MG tablet Take 1 tablet (500 mg total) by mouth every 6 (six) hours as needed for muscle spasms. (Patient not taking: Reported on 07/12/2021) 30 tablet 0   Omega-3 Fatty Acids (FISH OIL) 1000 MG CAPS Take 1 capsule (1,000 mg total) by mouth  in the morning and at bedtime. (Patient taking differently: Take 1,000 mg by mouth daily.) 60 capsule PRN   polyethylene glycol (MIRALAX / GLYCOLAX) 17 g packet Take 17 g by mouth 2 (two) times daily. (Patient not taking: Reported on 07/12/2021) 14 each 0   sertraline (ZOLOFT) 50 MG tablet Take 2 tablets (100 mg total) by mouth daily. 180 tablet 1   simvastatin (ZOCOR) 40 MG tablet TAKE 1 TABLET BY MOUTH AT BEDTIME 90 tablet 1   Vitamin D, Cholecalciferol, 50 MCG (2000 UT) CAPS Take 2,000 Units by mouth daily.     No current facility-administered medications for this visit.    REVIEW OF SYSTEMS:   Constitutional: Denies fevers, chills or abnormal night sweats Eyes: Denies blurriness of vision, double vision or watery eyes Ears, nose, mouth, throat, and face: Denies mucositis or sore throat Respiratory: Denies cough, dyspnea or wheezes Cardiovascular: Denies palpitation, chest discomfort or lower extremity swelling Gastrointestinal:  Denies nausea, heartburn or change in bowel habits Skin: Denies abnormal skin rashes Lymphatics: Denies new lymphadenopathy or easy bruising Neurological:Denies numbness, tingling or  new weaknesses Behavioral/Psych: Mood is stable, no new changes  Breast: Denies any palpable lumps or discharge All other systems were reviewed with the patient and are negative.  PHYSICAL EXAMINATION: ECOG PERFORMANCE STATUS: 0 - Asymptomatic  Vitals:   04/27/22 1413  BP: (!) 168/92  Pulse: 85  Resp: 18  Temp: 97.7 F (36.5 C)  SpO2: 98%   Filed Weights   04/27/22 1413  Weight: 285 lb (129.3 kg)    GENERAL:alert, no distress and comfortable BREAST: Left breast appears to be healing well No lower extremity edema  LABORATORY DATA:  I have reviewed the data as listed Lab Results  Component Value Date   WBC 5.7 04/18/2022   HGB 12.4 04/18/2022   HCT 37.9 04/18/2022   MCV 90.5 04/18/2022   PLT 199 04/18/2022   Lab Results  Component Value Date   NA 139 04/18/2022   K 3.6 04/18/2022   CL 102 04/18/2022   CO2 31 04/18/2022    RADIOGRAPHIC STUDIES: I have personally reviewed the radiological reports and agreed with the findings in the report.  ASSESSMENT AND PLAN:  DCIS (ductal carcinoma in situ) of breast This is a very pleasant 67 year old female patient with newly diagnosed left breast DCIS, ER/PR positive referred to medical oncology for additional recommendations.  She is already postlumpectomy with grade 2 intermediate DCIS measuring 1.5 mm, negative margins.  She is seeing Dr. Lisbeth Renshaw for radiation.  After radiation she will proceed with antiestrogen therapy.  We have discussed the following findings about antiestrogen therapy.  We have discussed options for antiestrogen therapy today.  With regards to Tamoxifen, we discussed that this is a SERM, selective estrogen receptor modulator. We discussed mechanism of action of Tamoxifen, adverse effects on Tamoxifen including but not limited to post menopausal symptoms, increased risk of DVT/PE, increased risk of endometrial cancer, questionable cataracts with long term use and increased risk of cardiovascular events in  the study which was not statistically significant. A benefit from Tamoxifen would be improvement in bone density.  With regards to aromatase inhibitors, we discussed mechanism of action, adverse effects including but not limited to post menopausal symptoms, arthralgias, myalgias, increased risk of cardiovascular events and bone loss.   Her most recent bone density showed osteopenia.  At this time she appears to be leaning towards aromatase inhibitors.  I have given her printed information about anastrozole for further review.  She will return to clinic in about 6 to 7 weeks to initiate antiestrogen therapy.   All questions were answered. The patient knows to call the clinic with any problems, questions or concerns.    Benay Pike, MD 04/27/22

## 2022-04-27 NOTE — Assessment & Plan Note (Signed)
This is a very pleasant 67 year old female patient with newly diagnosed left breast DCIS, ER/PR positive referred to medical oncology for additional recommendations.  She is already postlumpectomy with grade 2 intermediate DCIS measuring 1.5 mm, negative margins.  She is seeing Dr. Lisbeth Renshaw for radiation.  After radiation she will proceed with antiestrogen therapy.  We have discussed the following findings about antiestrogen therapy.  We have discussed options for antiestrogen therapy today.  With regards to Tamoxifen, we discussed that this is a SERM, selective estrogen receptor modulator. We discussed mechanism of action of Tamoxifen, adverse effects on Tamoxifen including but not limited to post menopausal symptoms, increased risk of DVT/PE, increased risk of endometrial cancer, questionable cataracts with long term use and increased risk of cardiovascular events in the study which was not statistically significant. A benefit from Tamoxifen would be improvement in bone density.  With regards to aromatase inhibitors, we discussed mechanism of action, adverse effects including but not limited to post menopausal symptoms, arthralgias, myalgias, increased risk of cardiovascular events and bone loss.   Her most recent bone density showed osteopenia.  At this time she appears to be leaning towards aromatase inhibitors.  I have given her printed information about anastrozole for further review.  She will return to clinic in about 6 to 7 weeks to initiate antiestrogen therapy.

## 2022-04-28 ENCOUNTER — Telehealth: Payer: Self-pay | Admitting: Licensed Clinical Social Worker

## 2022-04-28 ENCOUNTER — Encounter: Payer: Self-pay | Admitting: *Deleted

## 2022-04-28 ENCOUNTER — Telehealth: Payer: Self-pay | Admitting: Hematology and Oncology

## 2022-04-28 ENCOUNTER — Ambulatory Visit: Payer: 59 | Admitting: Radiation Oncology

## 2022-04-28 NOTE — Telephone Encounter (Signed)
Left patient a vm regarding upcoming appointment

## 2022-04-28 NOTE — Telephone Encounter (Signed)
[place] Work  Clinical Social Work was referred by nurse for assessment of psychosocial needs.  Clinical Social Worker attempted to contact patient by phone  to offer support and assess for needs.   No answer. Left VM with direct contact information.     [place], [place] Worker [employer]

## 2022-05-01 ENCOUNTER — Inpatient Hospital Stay: Payer: 59 | Attending: Hematology and Oncology | Admitting: Licensed Clinical Social Worker

## 2022-05-01 NOTE — Progress Notes (Signed)
[place] Work  Initial Assessment   Andrea [name] is a 67 y.o. year old female contacted by phone. Clinical Social Work was referred by nurse for assessment of psychosocial needs.   SDOH (Social Determinants of Health) assessments performed: Yes SDOH Interventions    Flowsheet Row Clinical Support from 05/01/2022 in [place] at [facility] Office Visit from 03/31/2022 in [place] Office Visit from 01/30/2020 in [place] Office Visit from 10/28/2019 in [place] Office Visit from 04/08/2019 in [place] Work from 01/07/2016 in [facility]  SDOH Interventions        Food Insecurity Interventions [id number] Referral, Other (Comment)  [food pantry list] -- -- -- -- --  Transportation Interventions Payor Benefit -- -- -- -- --  Depression Interventions/Treatment  -- Medication, Counseling Medication, Counseling Currently on Treatment Counseling, Medication Counseling, Currently on Treatment  [patient currently on medication, will follow up with Behavioral Health]  Physical Activity Interventions -- Local [employer]  [She has [employer] membership] -- -- -- --       SDOH Screenings   Food Insecurity: Food Insecurity Present (04/27/2022)  Housing: Low Risk  (03/31/2022)  Transportation Needs: Unmet Transportation Needs (04/27/2022)  Utilities: At Risk (04/27/2022)  Alcohol Screen: Low Risk  (03/31/2022)  Depression (PHQ2-9): Low Risk  (04/27/2022)  Recent Concern: Depression (PHQ2-9) - High Risk (03/31/2022)  Financial Resource Strain: Medium Risk (01/06/2019)  Physical Activity: Inactive (03/31/2022)  Tobacco Use: Low Risk  (04/27/2022)     Distress Screen completed: No     No data to display            Family/Social Information:  Housing Arrangement: patient lives with 49 yo grandson who stays there most of the time Family members/support  persons in your life? Family Transportation concerns: yes, has limited number of rides per year from her Medicare plan (48 rides/ 24 round trip) and is having trouble contacting Medicaid re: transportation  Employment: Retired from [employer].  Income source: Conservation officer, historic buildings and SSI Financial concerns: Yes, current concerns Type of concern: Utilities, Rent/ mortgage, and Food Food access concerns: yes, costs are difficult- has $76 in [employer] and uses [employer] Religious or spiritual practice: Not known Services Currently in place:  SNAP, SSI & retirement, [facility] Medicare + Medicaid; has applied for low-income housing  Coping/ Adjustment to diagnosis: Patient understands treatment plan and what happens next? yes Concerns about diagnosis and/or treatment:  transportation to appts Patient reported stressors: Housing, Publishing rights manager, Transport planner, and [employer] Current coping skills/ strengths: Armed forces logistics/support/administrative officer , General fund of knowledge , and Other: ability to access resources    SUMMARY: Current SDOH Barriers:  Financial constraints related to rising rent & utility costs  Clinical Social Work Clinical Goal(s):  Explore community resource options for unmet needs related to:  Financial Strain   Interventions: Discussed common feeling and emotions when being diagnosed with cancer, and the importance of support during treatment Informed patient of the support team roles and support services at [facility] Provided [place] contact information and encouraged patient to call with any questions or concerns Provided information on local food pantries, Solicitor, [employer], and transportation.  Applied for Lacy Duverney with patient's permission Referred for [employer] grant   Follow Up Plan: Patient will contact CSW with any support or resource needs Patient verbalizes understanding of plan: Yes    Andrea Widmer E Infiniti Hoefling,  LCSW

## 2022-05-04 ENCOUNTER — Telehealth: Payer: Self-pay

## 2022-05-04 NOTE — Telephone Encounter (Signed)
Just wanted to route you in this message. This pt BMI > 50. Dr. Candis Schatz is ok with direct to the hospital. I have called the patient and made her aware that we would be cancelling her PV and procedure until we have a date at the hospital. She understand she would hear from a nurse with a new apt as well as PV once a spot became available.

## 2022-05-04 NOTE — Telephone Encounter (Signed)
Dr. Candis Schatz this pts BMI is 50.5 as of 04/27/22. Would you live OV or direct to hospital?

## 2022-05-05 ENCOUNTER — Encounter: Payer: Self-pay | Admitting: Family Medicine

## 2022-05-05 ENCOUNTER — Ambulatory Visit (INDEPENDENT_AMBULATORY_CARE_PROVIDER_SITE_OTHER): Payer: 59 | Admitting: Family Medicine

## 2022-05-05 VITALS — BP 154/110 | HR 68 | Ht 63.0 in | Wt 283.2 lb

## 2022-05-05 DIAGNOSIS — I10 Essential (primary) hypertension: Secondary | ICD-10-CM | POA: Diagnosis not present

## 2022-05-05 DIAGNOSIS — K219 Gastro-esophageal reflux disease without esophagitis: Secondary | ICD-10-CM

## 2022-05-05 DIAGNOSIS — E785 Hyperlipidemia, unspecified: Secondary | ICD-10-CM | POA: Diagnosis not present

## 2022-05-05 MED ORDER — AMLODIPINE BESYLATE 5 MG PO TABS: 5.0000 mg | ORAL_TABLET | Freq: Every day | ORAL | 2 refills | Status: DC

## 2022-05-05 MED ORDER — FAMOTIDINE 20 MG PO TABS: 20.0000 mg | ORAL_TABLET | Freq: Every day | ORAL | 1 refills | Status: DC | PRN

## 2022-05-05 NOTE — Progress Notes (Signed)
    SUBJECTIVE:   CHIEF COMPLAINT / HPI:   HTN/HLD:  She is here for f/u. She has no concerns. She has yet to take her morning dose of Coreg, but she did take her night dose. She is compliant with her Zocor 40 mg QD for HLD. She denies any other concerns.  GERD: Need Pepcid refilled.   PERTINENT  PMH / PSH: PMHx reviewed  OBJECTIVE:   Vitals:   05/05/22 1022 05/05/22 1036  BP: (!) 161/105 (!) 154/110  Pulse: 74 68  SpO2: 99%   Weight: 283 lb 3.2 oz (128.5 kg)   Height: 5\' 3"  (1.6 m)     Physical Exam Vitals and nursing note reviewed.  Cardiovascular:     Rate and Rhythm: Normal rate and regular rhythm.     Heart sounds: Normal heart sounds. No murmur heard. Pulmonary:     Effort: Pulmonary effort is normal. No respiratory distress.     Breath sounds: Normal breath sounds. No wheezing.      ASSESSMENT/PLAN:   HYPERTENSION, BENIGN SYSTEMIC BP remained elevated. She was previously on Norvasc, Aldactone and well as remotely on Lisinopril. These meds were previously held due to low BP. I discussed adding back Amlodipine and she is fine with that. F/U in 4 weeks for reassessment.  Hyperlipidemia FLP checked today.  Esophageal reflux Refilled Pepcid     [name], MD [place]

## 2022-05-05 NOTE — Assessment & Plan Note (Signed)
FLP checked today. 

## 2022-05-05 NOTE — Assessment & Plan Note (Signed)
Refilled Pepcid 

## 2022-05-05 NOTE — Assessment & Plan Note (Signed)
BP remained elevated. She was previously on Norvasc, Aldactone and well as remotely on Lisinopril. These meds were previously held due to low BP. I discussed adding back Amlodipine and she is fine with that. F/U in 4 weeks for reassessment.

## 2022-05-05 NOTE — Patient Instructions (Signed)
It was nice seeing you today. Your BP remains elevated. I have added Amlodipine 5 mg daily to your blood pressure regimen. Also continue your Coreg as usual. I will see you back in 4-6 week. Please come in sooner if your BP is too high or to low. Your BP goal is between 120/70 to 140/90.

## 2022-05-06 LAB — LIPID PANEL
Chol/HDL Ratio: 4.6 ratio — ABNORMAL HIGH (ref 0.0–4.4)
Cholesterol, Total: 161 mg/dL (ref 100–199)
HDL: 35 mg/dL — ABNORMAL LOW (ref >39)
LDL Chol Calc (NIH): 105 mg/dL — ABNORMAL HIGH (ref 0–99)
Triglycerides: 112 mg/dL (ref 0–149)
VLDL Cholesterol Cal: 21 mg/dL (ref 5–40)

## 2022-05-08 ENCOUNTER — Telehealth: Payer: Self-pay | Admitting: Family Medicine

## 2022-05-08 MED ORDER — SIMVASTATIN 40 MG PO TABS: 40.0000 mg | ORAL_TABLET | Freq: Every day | ORAL | 1 refills | Status: DC

## 2022-05-08 MED ORDER — CARVEDILOL 6.25 MG PO TABS: 6.2500 mg | ORAL_TABLET | Freq: Two times a day (BID) | ORAL | 1 refills | Status: DC

## 2022-05-08 NOTE — Telephone Encounter (Signed)
HIPAA compliant callback message left.  Please advise her that her lipid test is a bit elevated. Need to reconfirm compliance with Zocor 40 mg QD. We might need to switch her medication to a more potent one. I will discuss this with her at the next visit.  In the mean time, I have refilled her Zocor at the current dose.

## 2022-05-08 NOTE — Telephone Encounter (Signed)
Patient returns call to nurse line. She reports that she sometimes forgets to take medication. Advised importance of taking medication daily. Informed that refill on medication has been sent to pharmacy.   Patient will follow up at PCP at next scheduled appointment on 5/7.  No questions at this time.   Talbot Grumbling, RN

## 2022-05-09 DIAGNOSIS — D0512 Intraductal carcinoma in situ of left breast: Secondary | ICD-10-CM | POA: Insufficient documentation

## 2022-05-09 DIAGNOSIS — Z510 Encounter for antineoplastic radiation therapy: Secondary | ICD-10-CM | POA: Insufficient documentation

## 2022-05-09 DIAGNOSIS — Z170 Estrogen receptor positive status [ER+]: Secondary | ICD-10-CM | POA: Diagnosis not present

## 2022-05-10 ENCOUNTER — Telehealth: Payer: Self-pay

## 2022-05-10 NOTE — Telephone Encounter (Signed)
Andrea [name] I just wanted to make aware I called [name] to let her know I was cancelling her procedure and PV due to her BMI 50. Dr. Candis Schatz is ok with pt being added the hospital list per last TE. She will need a new PV once her new procedure is scheduled. Thank you.

## 2022-05-11 ENCOUNTER — Ambulatory Visit
Admission: RE | Admit: 2022-05-11 | Discharge: 2022-05-11 | Disposition: A | Discharge status: Home / Self Care | Payer: 59 | Source: Ambulatory Visit | Attending: Radiation Oncology | Admitting: Radiation Oncology

## 2022-05-11 ENCOUNTER — Other Ambulatory Visit: Payer: Self-pay

## 2022-05-11 DIAGNOSIS — J984 Other disorders of lung: Secondary | ICD-10-CM | POA: Diagnosis not present

## 2022-05-11 DIAGNOSIS — M6281 Muscle weakness (generalized): Secondary | ICD-10-CM | POA: Diagnosis not present

## 2022-05-11 DIAGNOSIS — N182 Chronic kidney disease, stage 2 (mild): Secondary | ICD-10-CM | POA: Diagnosis not present

## 2022-05-11 DIAGNOSIS — S7291XB Unspecified fracture of right femur, initial encounter for open fracture type I or II: Secondary | ICD-10-CM | POA: Diagnosis not present

## 2022-05-11 DIAGNOSIS — Z170 Estrogen receptor positive status [ER+]: Secondary | ICD-10-CM | POA: Diagnosis not present

## 2022-05-11 DIAGNOSIS — D0512 Intraductal carcinoma in situ of left breast: Secondary | ICD-10-CM | POA: Diagnosis not present

## 2022-05-11 DIAGNOSIS — Z510 Encounter for antineoplastic radiation therapy: Secondary | ICD-10-CM | POA: Diagnosis not present

## 2022-05-11 LAB — RAD ONC ARIA SESSION SUMMARY
Course Elapsed Days: 0
Plan Fractions Treated to Date: 1
Plan Prescribed Dose Per Fraction: 2.66 Gy
Plan Total Fractions Prescribed: 16
Plan Total Prescribed Dose: 42.56 Gy
Reference Point Dosage Given to Date: 2.66 Gy
Reference Point Session Dosage Given: 2.66 Gy
Session Number: 1

## 2022-05-12 ENCOUNTER — Ambulatory Visit
Admission: RE | Admit: 2022-05-12 | Discharge: 2022-05-12 | Disposition: A | Discharge status: Home / Self Care | Payer: 59 | Source: Ambulatory Visit | Attending: Radiation Oncology | Admitting: Radiation Oncology

## 2022-05-12 ENCOUNTER — Other Ambulatory Visit: Payer: Self-pay

## 2022-05-12 DIAGNOSIS — Z170 Estrogen receptor positive status [ER+]: Secondary | ICD-10-CM | POA: Diagnosis not present

## 2022-05-12 DIAGNOSIS — D0512 Intraductal carcinoma in situ of left breast: Secondary | ICD-10-CM

## 2022-05-12 DIAGNOSIS — Z510 Encounter for antineoplastic radiation therapy: Secondary | ICD-10-CM | POA: Diagnosis not present

## 2022-05-12 LAB — RAD ONC ARIA SESSION SUMMARY
Course Elapsed Days: 1
Plan Fractions Treated to Date: 2
Plan Prescribed Dose Per Fraction: 2.66 Gy
Plan Total Fractions Prescribed: 16
Plan Total Prescribed Dose: 42.56 Gy
Reference Point Dosage Given to Date: 5.32 Gy
Reference Point Session Dosage Given: 2.66 Gy
Session Number: 2

## 2022-05-12 MED ORDER — RADIAPLEXRX EX GEL: Freq: Once | CUTANEOUS | Status: AC

## 2022-05-12 MED ORDER — ALRA NON-METALLIC DEODORANT (RAD-ONC)
1.0000 | Freq: Once | TOPICAL | Status: AC
  Administered 2022-05-12: 1 via TOPICAL

## 2022-05-15 ENCOUNTER — Ambulatory Visit
Admission: RE | Admit: 2022-05-15 | Discharge: 2022-05-15 | Disposition: A | Discharge status: Home / Self Care | Payer: 59 | Source: Ambulatory Visit | Attending: Radiation Oncology | Admitting: Radiation Oncology

## 2022-05-15 ENCOUNTER — Other Ambulatory Visit: Payer: Self-pay

## 2022-05-15 DIAGNOSIS — Z510 Encounter for antineoplastic radiation therapy: Secondary | ICD-10-CM | POA: Diagnosis not present

## 2022-05-15 DIAGNOSIS — Z170 Estrogen receptor positive status [ER+]: Secondary | ICD-10-CM | POA: Diagnosis not present

## 2022-05-15 DIAGNOSIS — D0512 Intraductal carcinoma in situ of left breast: Secondary | ICD-10-CM | POA: Diagnosis not present

## 2022-05-15 LAB — RAD ONC ARIA SESSION SUMMARY
Course Elapsed Days: 4
Plan Fractions Treated to Date: 3
Plan Prescribed Dose Per Fraction: 2.66 Gy
Plan Total Fractions Prescribed: 16
Plan Total Prescribed Dose: 42.56 Gy
Reference Point Dosage Given to Date: 7.98 Gy
Reference Point Session Dosage Given: 2.66 Gy
Session Number: 3

## 2022-05-16 ENCOUNTER — Ambulatory Visit
Admission: RE | Admit: 2022-05-16 | Discharge: 2022-05-16 | Disposition: A | Discharge status: Home / Self Care | Payer: 59 | Source: Ambulatory Visit | Attending: Radiation Oncology | Admitting: Radiation Oncology

## 2022-05-16 ENCOUNTER — Other Ambulatory Visit: Payer: Self-pay

## 2022-05-16 DIAGNOSIS — Z510 Encounter for antineoplastic radiation therapy: Secondary | ICD-10-CM | POA: Diagnosis not present

## 2022-05-16 DIAGNOSIS — Z170 Estrogen receptor positive status [ER+]: Secondary | ICD-10-CM | POA: Diagnosis not present

## 2022-05-16 DIAGNOSIS — D0512 Intraductal carcinoma in situ of left breast: Secondary | ICD-10-CM | POA: Diagnosis not present

## 2022-05-16 LAB — RAD ONC ARIA SESSION SUMMARY
Course Elapsed Days: 5
Plan Fractions Treated to Date: 4
Plan Prescribed Dose Per Fraction: 2.66 Gy
Plan Total Fractions Prescribed: 16
Plan Total Prescribed Dose: 42.56 Gy
Reference Point Dosage Given to Date: 10.64 Gy
Reference Point Session Dosage Given: 2.66 Gy
Session Number: 4

## 2022-05-16 NOTE — Telephone Encounter (Signed)
Maya please add this pt to the hospital list

## 2022-05-16 NOTE — Telephone Encounter (Signed)
Maya see notes below, please add to hospital list.

## 2022-05-17 ENCOUNTER — Ambulatory Visit
Admission: RE | Admit: 2022-05-17 | Discharge: 2022-05-17 | Disposition: A | Discharge status: Home / Self Care | Payer: 59 | Source: Ambulatory Visit | Attending: Radiation Oncology | Admitting: Radiation Oncology

## 2022-05-17 ENCOUNTER — Other Ambulatory Visit: Payer: Self-pay

## 2022-05-17 DIAGNOSIS — Z510 Encounter for antineoplastic radiation therapy: Secondary | ICD-10-CM | POA: Diagnosis not present

## 2022-05-17 DIAGNOSIS — Z170 Estrogen receptor positive status [ER+]: Secondary | ICD-10-CM | POA: Diagnosis not present

## 2022-05-17 DIAGNOSIS — D0512 Intraductal carcinoma in situ of left breast: Secondary | ICD-10-CM | POA: Diagnosis not present

## 2022-05-17 LAB — RAD ONC ARIA SESSION SUMMARY
Course Elapsed Days: 6
Plan Fractions Treated to Date: 5
Plan Prescribed Dose Per Fraction: 2.66 Gy
Plan Total Fractions Prescribed: 16
Plan Total Prescribed Dose: 42.56 Gy
Reference Point Dosage Given to Date: 13.3 Gy
Reference Point Session Dosage Given: 2.66 Gy
Session Number: 5

## 2022-05-18 ENCOUNTER — Other Ambulatory Visit: Payer: Self-pay

## 2022-05-18 ENCOUNTER — Ambulatory Visit
Admission: RE | Admit: 2022-05-18 | Discharge: 2022-05-18 | Disposition: A | Discharge status: Home / Self Care | Payer: 59 | Source: Ambulatory Visit | Attending: Radiation Oncology | Admitting: Radiation Oncology

## 2022-05-18 DIAGNOSIS — Z170 Estrogen receptor positive status [ER+]: Secondary | ICD-10-CM | POA: Diagnosis not present

## 2022-05-18 DIAGNOSIS — Z510 Encounter for antineoplastic radiation therapy: Secondary | ICD-10-CM | POA: Diagnosis not present

## 2022-05-18 DIAGNOSIS — D0512 Intraductal carcinoma in situ of left breast: Secondary | ICD-10-CM | POA: Diagnosis not present

## 2022-05-18 LAB — RAD ONC ARIA SESSION SUMMARY
Course Elapsed Days: 7
Plan Fractions Treated to Date: 6
Plan Prescribed Dose Per Fraction: 2.66 Gy
Plan Total Fractions Prescribed: 16
Plan Total Prescribed Dose: 42.56 Gy
Reference Point Dosage Given to Date: 15.96 Gy
Reference Point Session Dosage Given: 2.66 Gy
Session Number: 6

## 2022-05-19 ENCOUNTER — Ambulatory Visit
Admission: RE | Admit: 2022-05-19 | Discharge: 2022-05-19 | Disposition: A | Discharge status: Home / Self Care | Payer: 59 | Source: Ambulatory Visit | Attending: Radiation Oncology | Admitting: Radiation Oncology

## 2022-05-19 ENCOUNTER — Other Ambulatory Visit: Payer: Self-pay

## 2022-05-19 DIAGNOSIS — D0512 Intraductal carcinoma in situ of left breast: Secondary | ICD-10-CM | POA: Diagnosis not present

## 2022-05-19 DIAGNOSIS — Z170 Estrogen receptor positive status [ER+]: Secondary | ICD-10-CM | POA: Diagnosis not present

## 2022-05-19 DIAGNOSIS — Z510 Encounter for antineoplastic radiation therapy: Secondary | ICD-10-CM | POA: Diagnosis not present

## 2022-05-19 LAB — RAD ONC ARIA SESSION SUMMARY
Course Elapsed Days: 8
Plan Fractions Treated to Date: 7
Plan Prescribed Dose Per Fraction: 2.66 Gy
Plan Total Fractions Prescribed: 16
Plan Total Prescribed Dose: 42.56 Gy
Reference Point Dosage Given to Date: 18.62 Gy
Reference Point Session Dosage Given: 2.66 Gy
Session Number: 7

## 2022-05-21 DIAGNOSIS — Z170 Estrogen receptor positive status [ER+]: Secondary | ICD-10-CM | POA: Diagnosis not present

## 2022-05-21 DIAGNOSIS — D0512 Intraductal carcinoma in situ of left breast: Secondary | ICD-10-CM | POA: Diagnosis not present

## 2022-05-21 DIAGNOSIS — Z510 Encounter for antineoplastic radiation therapy: Secondary | ICD-10-CM | POA: Diagnosis not present

## 2022-05-22 ENCOUNTER — Other Ambulatory Visit: Payer: Self-pay

## 2022-05-22 ENCOUNTER — Ambulatory Visit
Admission: RE | Admit: 2022-05-22 | Discharge: 2022-05-22 | Disposition: A | Discharge status: Home / Self Care | Payer: 59 | Source: Ambulatory Visit | Attending: Radiation Oncology | Admitting: Radiation Oncology

## 2022-05-22 DIAGNOSIS — Z510 Encounter for antineoplastic radiation therapy: Secondary | ICD-10-CM | POA: Diagnosis not present

## 2022-05-22 DIAGNOSIS — Z170 Estrogen receptor positive status [ER+]: Secondary | ICD-10-CM | POA: Diagnosis not present

## 2022-05-22 DIAGNOSIS — D0512 Intraductal carcinoma in situ of left breast: Secondary | ICD-10-CM | POA: Diagnosis not present

## 2022-05-22 LAB — RAD ONC ARIA SESSION SUMMARY
Course Elapsed Days: 11
Plan Fractions Treated to Date: 8
Plan Prescribed Dose Per Fraction: 2.66 Gy
Plan Total Fractions Prescribed: 16
Plan Total Prescribed Dose: 42.56 Gy
Reference Point Dosage Given to Date: 21.28 Gy
Reference Point Session Dosage Given: 2.66 Gy
Session Number: 8

## 2022-05-23 ENCOUNTER — Other Ambulatory Visit: Payer: Self-pay

## 2022-05-23 ENCOUNTER — Ambulatory Visit
Admission: RE | Admit: 2022-05-23 | Discharge: 2022-05-23 | Disposition: A | Discharge status: Home / Self Care | Payer: 59 | Source: Ambulatory Visit | Attending: Radiation Oncology | Admitting: Radiation Oncology

## 2022-05-23 DIAGNOSIS — Z510 Encounter for antineoplastic radiation therapy: Secondary | ICD-10-CM | POA: Diagnosis not present

## 2022-05-23 DIAGNOSIS — Z170 Estrogen receptor positive status [ER+]: Secondary | ICD-10-CM | POA: Diagnosis not present

## 2022-05-23 DIAGNOSIS — D0512 Intraductal carcinoma in situ of left breast: Secondary | ICD-10-CM | POA: Diagnosis not present

## 2022-05-23 LAB — RAD ONC ARIA SESSION SUMMARY
Course Elapsed Days: 12
Plan Fractions Treated to Date: 9
Plan Prescribed Dose Per Fraction: 2.66 Gy
Plan Total Fractions Prescribed: 16
Plan Total Prescribed Dose: 42.56 Gy
Reference Point Dosage Given to Date: 23.94 Gy
Reference Point Session Dosage Given: 2.66 Gy
Session Number: 9

## 2022-05-24 ENCOUNTER — Other Ambulatory Visit: Payer: Self-pay

## 2022-05-24 ENCOUNTER — Ambulatory Visit
Admission: RE | Admit: 2022-05-24 | Discharge: 2022-05-24 | Disposition: A | Discharge status: Home / Self Care | Payer: 59 | Source: Ambulatory Visit | Attending: Radiation Oncology | Admitting: Radiation Oncology

## 2022-05-24 DIAGNOSIS — Z170 Estrogen receptor positive status [ER+]: Secondary | ICD-10-CM | POA: Diagnosis not present

## 2022-05-24 DIAGNOSIS — D0512 Intraductal carcinoma in situ of left breast: Secondary | ICD-10-CM | POA: Diagnosis not present

## 2022-05-24 DIAGNOSIS — Z510 Encounter for antineoplastic radiation therapy: Secondary | ICD-10-CM | POA: Diagnosis not present

## 2022-05-24 LAB — RAD ONC ARIA SESSION SUMMARY
Course Elapsed Days: 13
Plan Fractions Treated to Date: 10
Plan Prescribed Dose Per Fraction: 2.66 Gy
Plan Total Fractions Prescribed: 16
Plan Total Prescribed Dose: 42.56 Gy
Reference Point Dosage Given to Date: 26.6 Gy
Reference Point Session Dosage Given: 2.66 Gy
Session Number: 10

## 2022-05-25 ENCOUNTER — Other Ambulatory Visit: Payer: Self-pay

## 2022-05-25 ENCOUNTER — Ambulatory Visit
Admission: RE | Admit: 2022-05-25 | Discharge: 2022-05-25 | Disposition: A | Discharge status: Home / Self Care | Payer: 59 | Source: Ambulatory Visit | Attending: Radiation Oncology | Admitting: Radiation Oncology

## 2022-05-25 DIAGNOSIS — Z510 Encounter for antineoplastic radiation therapy: Secondary | ICD-10-CM | POA: Diagnosis not present

## 2022-05-25 DIAGNOSIS — D0512 Intraductal carcinoma in situ of left breast: Secondary | ICD-10-CM | POA: Diagnosis not present

## 2022-05-25 DIAGNOSIS — Z170 Estrogen receptor positive status [ER+]: Secondary | ICD-10-CM | POA: Diagnosis not present

## 2022-05-25 LAB — RAD ONC ARIA SESSION SUMMARY
Course Elapsed Days: 14
Plan Fractions Treated to Date: 11
Plan Prescribed Dose Per Fraction: 2.66 Gy
Plan Total Fractions Prescribed: 16
Plan Total Prescribed Dose: 42.56 Gy
Reference Point Dosage Given to Date: 29.26 Gy
Reference Point Session Dosage Given: 2.66 Gy
Session Number: 11

## 2022-05-26 ENCOUNTER — Ambulatory Visit
Admission: RE | Admit: 2022-05-26 | Discharge: 2022-05-26 | Disposition: A | Discharge status: Home / Self Care | Payer: 59 | Source: Ambulatory Visit | Attending: Radiation Oncology | Admitting: Radiation Oncology

## 2022-05-26 ENCOUNTER — Other Ambulatory Visit: Payer: Self-pay

## 2022-05-26 ENCOUNTER — Inpatient Hospital Stay
Admission: RE | Admit: 2022-05-26 | Discharge: 2022-05-26 | Disposition: A | Discharge status: Home / Self Care | Payer: 59 | Source: Ambulatory Visit | Attending: Radiation Oncology | Admitting: Radiation Oncology

## 2022-05-26 DIAGNOSIS — Z510 Encounter for antineoplastic radiation therapy: Secondary | ICD-10-CM | POA: Diagnosis not present

## 2022-05-26 DIAGNOSIS — Z170 Estrogen receptor positive status [ER+]: Secondary | ICD-10-CM | POA: Diagnosis not present

## 2022-05-26 DIAGNOSIS — D0512 Intraductal carcinoma in situ of left breast: Secondary | ICD-10-CM | POA: Diagnosis not present

## 2022-05-26 LAB — RAD ONC ARIA SESSION SUMMARY
Course Elapsed Days: 15
Plan Fractions Treated to Date: 12
Plan Prescribed Dose Per Fraction: 2.66 Gy
Plan Total Fractions Prescribed: 16
Plan Total Prescribed Dose: 42.56 Gy
Reference Point Dosage Given to Date: 31.92 Gy
Reference Point Session Dosage Given: 2.66 Gy
Session Number: 12

## 2022-05-29 ENCOUNTER — Other Ambulatory Visit: Payer: Self-pay

## 2022-05-29 ENCOUNTER — Ambulatory Visit
Admission: RE | Admit: 2022-05-29 | Discharge: 2022-05-29 | Disposition: A | Discharge status: Home / Self Care | Payer: 59 | Source: Ambulatory Visit | Attending: Radiation Oncology | Admitting: Radiation Oncology

## 2022-05-29 DIAGNOSIS — D0512 Intraductal carcinoma in situ of left breast: Secondary | ICD-10-CM | POA: Diagnosis not present

## 2022-05-29 DIAGNOSIS — Z170 Estrogen receptor positive status [ER+]: Secondary | ICD-10-CM | POA: Diagnosis not present

## 2022-05-29 DIAGNOSIS — Z510 Encounter for antineoplastic radiation therapy: Secondary | ICD-10-CM | POA: Diagnosis not present

## 2022-05-29 LAB — RAD ONC ARIA SESSION SUMMARY
Course Elapsed Days: 18
Plan Fractions Treated to Date: 13
Plan Prescribed Dose Per Fraction: 2.66 Gy
Plan Total Fractions Prescribed: 16
Plan Total Prescribed Dose: 42.56 Gy
Reference Point Dosage Given to Date: 34.58 Gy
Reference Point Session Dosage Given: 2.66 Gy
Session Number: 13

## 2022-05-30 ENCOUNTER — Other Ambulatory Visit: Payer: Self-pay

## 2022-05-30 ENCOUNTER — Ambulatory Visit
Admission: RE | Admit: 2022-05-30 | Discharge: 2022-05-30 | Disposition: A | Discharge status: Home / Self Care | Payer: 59 | Source: Ambulatory Visit | Attending: Radiation Oncology | Admitting: Radiation Oncology

## 2022-05-30 ENCOUNTER — Telehealth: Payer: Self-pay

## 2022-05-30 DIAGNOSIS — Z1211 Encounter for screening for malignant neoplasm of colon: Secondary | ICD-10-CM

## 2022-05-30 DIAGNOSIS — D0512 Intraductal carcinoma in situ of left breast: Secondary | ICD-10-CM | POA: Diagnosis not present

## 2022-05-30 DIAGNOSIS — Z510 Encounter for antineoplastic radiation therapy: Secondary | ICD-10-CM | POA: Diagnosis not present

## 2022-05-30 DIAGNOSIS — Z170 Estrogen receptor positive status [ER+]: Secondary | ICD-10-CM | POA: Diagnosis not present

## 2022-05-30 LAB — RAD ONC ARIA SESSION SUMMARY
Course Elapsed Days: 19
Plan Fractions Treated to Date: 14
Plan Prescribed Dose Per Fraction: 2.66 Gy
Plan Total Fractions Prescribed: 16
Plan Total Prescribed Dose: 42.56 Gy
Reference Point Dosage Given to Date: 37.24 Gy
Reference Point Session Dosage Given: 2.66 Gy
Session Number: 14

## 2022-05-30 NOTE — Telephone Encounter (Signed)
Patient is returning your call.  

## 2022-05-30 NOTE — Telephone Encounter (Signed)
Patient is scheduled for 07/06/22 @ 7:30am. Pre-visit is scheduled for 06/27/22 @ 2pm. Patient has been informed and had no questions at the end of call.

## 2022-05-30 NOTE — Telephone Encounter (Signed)
Left VM for patient to return call regarding Hospital openings on 4/25 and 5/9.

## 2022-05-31 ENCOUNTER — Other Ambulatory Visit: Payer: Self-pay

## 2022-05-31 ENCOUNTER — Ambulatory Visit
Admission: RE | Admit: 2022-05-31 | Discharge: 2022-05-31 | Disposition: A | Discharge status: Home / Self Care | Payer: 59 | Source: Ambulatory Visit | Attending: Radiation Oncology | Admitting: Radiation Oncology

## 2022-05-31 ENCOUNTER — Encounter: Payer: 59 | Admitting: Gastroenterology

## 2022-05-31 ENCOUNTER — Telehealth: Payer: Self-pay | Admitting: Gastroenterology

## 2022-05-31 DIAGNOSIS — D0512 Intraductal carcinoma in situ of left breast: Secondary | ICD-10-CM | POA: Diagnosis not present

## 2022-05-31 DIAGNOSIS — Z170 Estrogen receptor positive status [ER+]: Secondary | ICD-10-CM | POA: Diagnosis not present

## 2022-05-31 DIAGNOSIS — Z510 Encounter for antineoplastic radiation therapy: Secondary | ICD-10-CM | POA: Diagnosis not present

## 2022-05-31 LAB — RAD ONC ARIA SESSION SUMMARY
Course Elapsed Days: 20
Plan Fractions Treated to Date: 15
Plan Prescribed Dose Per Fraction: 2.66 Gy
Plan Total Fractions Prescribed: 16
Plan Total Prescribed Dose: 42.56 Gy
Reference Point Dosage Given to Date: 39.9 Gy
Reference Point Session Dosage Given: 2.66 Gy
Session Number: 15

## 2022-05-31 NOTE — Telephone Encounter (Signed)
Patient is calling wishing to cancel her [facility] procedure, states she will call back in the summer time to reschedule. Please advise

## 2022-05-31 NOTE — Telephone Encounter (Signed)
Appt cancelled per pt request. FYI states she will call back this summer to schedule.

## 2022-06-01 ENCOUNTER — Ambulatory Visit
Admission: RE | Admit: 2022-06-01 | Discharge: 2022-06-01 | Disposition: A | Discharge status: Home / Self Care | Payer: 59 | Source: Ambulatory Visit | Attending: Radiation Oncology | Admitting: Radiation Oncology

## 2022-06-01 ENCOUNTER — Other Ambulatory Visit: Payer: Self-pay

## 2022-06-01 DIAGNOSIS — Z510 Encounter for antineoplastic radiation therapy: Secondary | ICD-10-CM | POA: Diagnosis not present

## 2022-06-01 DIAGNOSIS — Z170 Estrogen receptor positive status [ER+]: Secondary | ICD-10-CM | POA: Diagnosis not present

## 2022-06-01 DIAGNOSIS — D0512 Intraductal carcinoma in situ of left breast: Secondary | ICD-10-CM | POA: Diagnosis not present

## 2022-06-01 LAB — RAD ONC ARIA SESSION SUMMARY
Course Elapsed Days: 21
Plan Fractions Treated to Date: 16
Plan Prescribed Dose Per Fraction: 2.66 Gy
Plan Total Fractions Prescribed: 16
Plan Total Prescribed Dose: 42.56 Gy
Reference Point Dosage Given to Date: 42.56 Gy
Reference Point Session Dosage Given: 2.66 Gy
Session Number: 16

## 2022-06-02 ENCOUNTER — Ambulatory Visit
Admission: RE | Admit: 2022-06-02 | Discharge: 2022-06-02 | Disposition: A | Discharge status: Home / Self Care | Payer: 59 | Source: Ambulatory Visit | Attending: Radiation Oncology | Admitting: Radiation Oncology

## 2022-06-02 ENCOUNTER — Other Ambulatory Visit: Payer: Self-pay

## 2022-06-02 DIAGNOSIS — Z510 Encounter for antineoplastic radiation therapy: Secondary | ICD-10-CM | POA: Diagnosis not present

## 2022-06-02 DIAGNOSIS — D0512 Intraductal carcinoma in situ of left breast: Secondary | ICD-10-CM

## 2022-06-02 DIAGNOSIS — Z170 Estrogen receptor positive status [ER+]: Secondary | ICD-10-CM | POA: Diagnosis not present

## 2022-06-02 LAB — RAD ONC ARIA SESSION SUMMARY
Course Elapsed Days: 22
Plan Fractions Treated to Date: 1
Plan Prescribed Dose Per Fraction: 2 Gy
Plan Total Fractions Prescribed: 4
Plan Total Prescribed Dose: 8 Gy
Reference Point Dosage Given to Date: 2 Gy
Reference Point Session Dosage Given: 2 Gy
Session Number: 17

## 2022-06-02 MED ORDER — SILVER SULFADIAZINE 1 % EX CREA: TOPICAL_CREAM | Freq: Every day | CUTANEOUS | Status: DC

## 2022-06-05 ENCOUNTER — Other Ambulatory Visit: Payer: Self-pay

## 2022-06-05 ENCOUNTER — Ambulatory Visit
Admission: RE | Admit: 2022-06-05 | Discharge: 2022-06-05 | Disposition: A | Discharge status: Home / Self Care | Payer: 59 | Source: Ambulatory Visit | Attending: Radiation Oncology | Admitting: Radiation Oncology

## 2022-06-05 DIAGNOSIS — D0512 Intraductal carcinoma in situ of left breast: Secondary | ICD-10-CM | POA: Diagnosis not present

## 2022-06-05 LAB — RAD ONC ARIA SESSION SUMMARY
Course Elapsed Days: 25
Plan Fractions Treated to Date: 2
Plan Prescribed Dose Per Fraction: 2 Gy
Plan Total Fractions Prescribed: 4
Plan Total Prescribed Dose: 8 Gy
Reference Point Dosage Given to Date: 4 Gy
Reference Point Session Dosage Given: 2 Gy
Session Number: 18

## 2022-06-06 ENCOUNTER — Ambulatory Visit
Admission: RE | Admit: 2022-06-06 | Discharge: 2022-06-06 | Disposition: A | Discharge status: Home / Self Care | Payer: 59 | Source: Ambulatory Visit | Attending: Radiation Oncology | Admitting: Radiation Oncology

## 2022-06-06 ENCOUNTER — Other Ambulatory Visit: Payer: Self-pay

## 2022-06-06 DIAGNOSIS — D0512 Intraductal carcinoma in situ of left breast: Secondary | ICD-10-CM | POA: Diagnosis not present

## 2022-06-06 LAB — RAD ONC ARIA SESSION SUMMARY
Course Elapsed Days: 26
Plan Fractions Treated to Date: 3
Plan Prescribed Dose Per Fraction: 2 Gy
Plan Total Fractions Prescribed: 4
Plan Total Prescribed Dose: 8 Gy
Reference Point Dosage Given to Date: 6 Gy
Reference Point Session Dosage Given: 2 Gy
Session Number: 19

## 2022-06-07 ENCOUNTER — Encounter: Payer: Self-pay | Admitting: *Deleted

## 2022-06-07 ENCOUNTER — Encounter: Payer: Self-pay | Admitting: Radiation Oncology

## 2022-06-07 ENCOUNTER — Ambulatory Visit
Admission: RE | Admit: 2022-06-07 | Discharge: 2022-06-07 | Disposition: A | Discharge status: Home / Self Care | Payer: 59 | Source: Ambulatory Visit | Attending: Radiation Oncology | Admitting: Radiation Oncology

## 2022-06-07 ENCOUNTER — Other Ambulatory Visit: Payer: Self-pay

## 2022-06-07 DIAGNOSIS — Z510 Encounter for antineoplastic radiation therapy: Secondary | ICD-10-CM | POA: Diagnosis not present

## 2022-06-07 DIAGNOSIS — Z170 Estrogen receptor positive status [ER+]: Secondary | ICD-10-CM | POA: Diagnosis not present

## 2022-06-07 DIAGNOSIS — D0512 Intraductal carcinoma in situ of left breast: Secondary | ICD-10-CM

## 2022-06-07 LAB — RAD ONC ARIA SESSION SUMMARY
Course Elapsed Days: 27
Plan Fractions Treated to Date: 4
Plan Prescribed Dose Per Fraction: 2 Gy
Plan Total Fractions Prescribed: 4
Plan Total Prescribed Dose: 8 Gy
Reference Point Dosage Given to Date: 8 Gy
Reference Point Session Dosage Given: 2 Gy
Session Number: 20

## 2022-06-09 ENCOUNTER — Telehealth: Payer: Self-pay | Admitting: Adult Health

## 2022-06-09 NOTE — Telephone Encounter (Signed)
Spoke with patient confirming upcoming appointment  

## 2022-06-11 DIAGNOSIS — M6281 Muscle weakness (generalized): Secondary | ICD-10-CM | POA: Diagnosis not present

## 2022-06-11 DIAGNOSIS — S7291XB Unspecified fracture of right femur, initial encounter for open fracture type I or II: Secondary | ICD-10-CM | POA: Diagnosis not present

## 2022-06-11 DIAGNOSIS — J984 Other disorders of lung: Secondary | ICD-10-CM | POA: Diagnosis not present

## 2022-06-11 DIAGNOSIS — N182 Chronic kidney disease, stage 2 (mild): Secondary | ICD-10-CM | POA: Diagnosis not present

## 2022-06-12 ENCOUNTER — Encounter: Payer: Self-pay | Admitting: Licensed Clinical Social Worker

## 2022-06-12 ENCOUNTER — Inpatient Hospital Stay: Payer: 59

## 2022-06-12 ENCOUNTER — Other Ambulatory Visit: Payer: Self-pay | Admitting: Licensed Clinical Social Worker

## 2022-06-12 ENCOUNTER — Inpatient Hospital Stay: Payer: 59 | Attending: Hematology and Oncology | Admitting: Licensed Clinical Social Worker

## 2022-06-12 ENCOUNTER — Other Ambulatory Visit: Payer: Self-pay

## 2022-06-12 DIAGNOSIS — Z8042 Family history of malignant neoplasm of prostate: Secondary | ICD-10-CM

## 2022-06-12 DIAGNOSIS — D0512 Intraductal carcinoma in situ of left breast: Secondary | ICD-10-CM | POA: Insufficient documentation

## 2022-06-12 DIAGNOSIS — Z8041 Family history of malignant neoplasm of ovary: Secondary | ICD-10-CM | POA: Diagnosis not present

## 2022-06-12 DIAGNOSIS — Z801 Family history of malignant neoplasm of trachea, bronchus and lung: Secondary | ICD-10-CM | POA: Diagnosis not present

## 2022-06-12 DIAGNOSIS — Z923 Personal history of irradiation: Secondary | ICD-10-CM | POA: Insufficient documentation

## 2022-06-12 DIAGNOSIS — Z79811 Long term (current) use of aromatase inhibitors: Secondary | ICD-10-CM | POA: Insufficient documentation

## 2022-06-12 LAB — GENETIC SCREENING ORDER

## 2022-06-12 NOTE — Progress Notes (Signed)
  Radiation Oncology         [phone] ________________________________  Name: Andrea [name] MRN: [id number]  Date: 06/07/2022  DOB: Oct 21, 1955  End of Treatment Note  Diagnosis:     Intermediate grade, ER/PR positive DCIS of the left breast.   Indication for treatment:  Curative       Radiation treatment dates:   05/11/22-06/07/22  Site/dose:   The patient initially received a dose of 42.56 Gy in 16 fractions to the left breast using whole-breast tangent fields. This was delivered using a 3-D conformal technique. The patient then received a boost to the seroma. This delivered an additional 8 Gy in 34fractions using a 3 field photon technique due to the depth of the seroma. The total dose was 50.56 Gy.  Narrative: The patient tolerated radiation treatment relatively well.  She developed fatigue and anticipated skin changes in the treatment field.   Plan:  The patient will receive a call in about one month from the radiation oncology department. She will continue follow up with Dr. Al Pimple as well.      Osker Mason, PAC

## 2022-06-12 NOTE — Progress Notes (Signed)
REFERRING PROVIDER: Ronny Bacon, PA-C [address] [place],  [place] [address]  PRIMARY PROVIDER:  Doreene Eland, MD  PRIMARY REASON FOR VISIT:  1. Ductal carcinoma in situ (DCIS) of left breast   2. Family history of prostate cancer   3. Family history of ovarian cancer   4. Family history of lung cancer      HISTORY OF PRESENT ILLNESS:   [name], a 67 y.o. female, was seen for a [facility] cancer genetics consultation due to a personal and family history of cancer.  Ms. Rish presents to clinic today to discuss the possibility of a hereditary predisposition to cancer, genetic testing, and to further clarify her future cancer risks, as well as potential cancer risks for family members.   CANCER HISTORY:  In 2024, at the age of 76, Ms. Fargnoli was diagnosed with DCIS of the left breast. This was treated with lumpectomy, radiation and antiestrogen therapy.  Oncology History  DCIS (ductal carcinoma in situ) of breast  01/04/2022 Mammogram   Mammogram showed possible mass with associated calcifications in the left breast warranting further investigation.  Left breast diagnostic mammogram confirmed abnormality.  Ultrasound showed no sonographic correlate to the left breast mass.  No axillary adenopathy   01/24/2022 Pathology Results   Left breast needle core biopsy showed atypical ductal hyperplasia involving a papillary lesion.   03/16/2022 Definitive Surgery   Patient underwent left breast lumpectomy which showed a small focus of DCIS, intermediate grade, 1.5 mm, previous procedure related changes including focal squamous metaplasia, resection margins negative for DCIS.  Prognostic show ER +95% strong staining PR +95% strong staining   04/27/2022 Initial Diagnosis   DCIS (ductal carcinoma in situ) of breast    RISK FACTORS:  Menarche was at age 36.  First live birth at age 63.  Ovaries intact: yes.  Hysterectomy: yes.  Menopausal status: postmenopausal.   Colonoscopy: yes; normal. Mammogram within the last year: yes. Number of breast biopsies: 1.   Past Medical History:  Diagnosis Date   Abnormal mammogram 08/28/2014   08/28/14 - Probable benign microcalcifications over the outer mid to lower left breast. Bi-Rads 3. F/U 6 months    Allergic rhinitis 06/22/2014   Arm pain, lateral, left 04/28/2016   Asthma    Breast cancer 03/16/2022   Carpal tunnel syndrome 04/26/2006   Qualifier: Diagnosis of  By: Irving Burton MD, Aaron     Femur fracture 05/14/2021   Femur fracture, right 05/14/2021   First degree AV block 09/22/2013   EKG 09/22/13    GANGLION CYST, WRIST, RIGHT 10/06/2008   Qualifier: Diagnosis of  By: Lanier Prude  MD, Taineisha     Hypothyroidism    Imbalance 01/12/2017   Palpitations 09/22/2013   Right wrist pain 04/28/2016    Past Surgical History:  Procedure Laterality Date   ABDOMINAL HYSTERECTOMY     fibroids   BREAST BIOPSY Left 01/24/2022   MM LT BREAST BX W LOC DEV 1ST LESION IMAGE BX SPEC STEREO GUIDE 01/24/2022 [facility]   BREAST BIOPSY  03/15/2022   MM LT RADIOACTIVE SEED LOC MAMMO GUIDE 03/15/2022 [facility]   BREAST LUMPECTOMY WITH RADIOACTIVE SEED LOCALIZATION Left 03/16/2022   Procedure: LEFT BREAST LUMPECTOMY WITH RADIOACTIVE SEED LOCALIZATION;  Surgeon: Harriette Bouillon, MD;  Location: [facility];  Service: General;  Laterality: Left;   FEMUR IM NAIL Right 05/14/2021   Procedure: INTRAMEDULLARY (IM) RETROGRADE FEMORAL NAILING;  Surgeon: Estill Bamberg, MD;  Location: [facility];  Service:  Orthopedics;  Laterality: Right;    FAMILY HISTORY:  We obtained a detailed, 4-generation family history.  Significant diagnoses are listed below: Family History  Problem Relation Age of Onset   Brain cancer Mother        cervial, pituitary, bone?   Hypertension Mother    Lung cancer Father    Alcohol abuse Father    Hypertension Sister    Hypothyroidism Sister    Hypertension Sister     Hypothyroidism Sister    Hypertension Sister    Hypothyroidism Sister    Hypertension Sister    Hypothyroidism Sister    Hypertension Sister    Hypothyroidism Sister    Prostate cancer Brother    Lung cancer Brother        lung   Ovarian cancer Maternal Aunt    Ms. Chima has 1 daughter, 66. She has 5 sisters and 3 brothers. One brother recently died of prostate cancer at 52. Another brother died of lung cancer.  Ms. Rosamond's mother died of brain cancer at 48. She had a maternal half sister that died of ovarian cancer in her 79s. No other known cancers on this side of the family.  Ms. Trunzo father died of lung cancer. She possibly had two aunts that died of cancer as well, likely lung cancer. No other known cancers on this side of the family.  Ms. Hardnett is unaware of previous family history of genetic testing for hereditary cancer risks. There is no reported Ashkenazi Jewish ancestry. There is no known consanguinity.    GENETIC COUNSELING ASSESSMENT: Ms. Ariola is a 67 y.o. female with a personal history of breast cancer and family history of prostate/ovarian cancer which is somewhat suggestive of a hereditary cancer syndrome and predisposition to cancer. We, therefore, discussed and recommended the following at today's visit.   DISCUSSION: We discussed that approximately 10% of breast cancer is hereditary. Most cases of hereditary breast/ovarian/prostate cancer are associated with BRCA1/BRCA2 genes, although there are other genes associated with hereditary cancer as well. Cancers and risks are gene specific. We discussed that testing is beneficial for several reasons including knowing about cancer risks, identifying potential screening and risk-reduction options that may be appropriate, and to understand if other family members could be at risk for cancer and allow them to undergo genetic testing.   We reviewed the characteristics, features and inheritance patterns of hereditary cancer  syndromes. We also discussed genetic testing, including the appropriate family members to test, the process of testing, insurance coverage and turn-around-time for results. We discussed the implications of a negative, positive and/or variant of uncertain significant result. We recommended Ms. Nishimura pursue genetic testing for the [facility] Multi-Cancer+RNA gene panel.   Based on Ms. Stonehocker's personal and family history of cancer, she meets medical criteria for genetic testing. Despite that she meets criteria, she may still have an out of pocket cost. We discussed that if her out of pocket cost for testing is over $100, the laboratory will call and confirm whether she wants to proceed with testing.  If the out of pocket cost of testing is less than $100 she will be billed by the genetic testing laboratory.   PLAN: After considering the risks, benefits, and limitations, Ms. Cossey provided informed consent to pursue genetic testing and the blood sample was sent to [facility] for analysis of the Multi-Cancer+RNA panel. Results should be available within approximately 2-3 weeks' time, at which point they will be disclosed by telephone to Ms. Maisie Fus, as  will any additional recommendations warranted by these results. Ms. Calafiore will receive a summary of her genetic counseling visit and a copy of her results once available. This information will also be available in Epic.   Ms. Biller's questions were answered to her satisfaction today. Our contact information was provided should additional questions or concerns arise. Thank you for the referral and allowing [place] to share in the care of your patient.   Lacy Duverney, MS, [facility] Genetic Counselor [email or web address] Phone: [phone]  The patient was seen for a total of 20 minutes in face-to-face genetic counseling.  Dr. Orlie Dakin was available for discussion regarding this case.    _______________________________________________________________________ For Office Staff:  Number of people involved in session: 1 Was an Intern/ student involved with case: no

## 2022-06-14 ENCOUNTER — Inpatient Hospital Stay (HOSPITAL_BASED_OUTPATIENT_CLINIC_OR_DEPARTMENT_OTHER): Payer: 59 | Admitting: Hematology and Oncology

## 2022-06-14 ENCOUNTER — Other Ambulatory Visit: Payer: Self-pay

## 2022-06-14 VITALS — BP 145/88 | HR 71 | Temp 97.9°F | Resp 18 | Ht 63.0 in | Wt 273.9 lb

## 2022-06-14 DIAGNOSIS — R234 Changes in skin texture: Secondary | ICD-10-CM

## 2022-06-14 DIAGNOSIS — Z79811 Long term (current) use of aromatase inhibitors: Secondary | ICD-10-CM | POA: Diagnosis not present

## 2022-06-14 DIAGNOSIS — D0512 Intraductal carcinoma in situ of left breast: Secondary | ICD-10-CM | POA: Diagnosis not present

## 2022-06-14 DIAGNOSIS — Z923 Personal history of irradiation: Secondary | ICD-10-CM | POA: Diagnosis not present

## 2022-06-14 MED ORDER — ANASTROZOLE 1 MG PO TABS: 1.0000 mg | ORAL_TABLET | Freq: Every day | ORAL | 3 refills | Status: DC

## 2022-06-14 NOTE — Assessment & Plan Note (Addendum)
This is a very pleasant 67 year old female patient with newly diagnosed left breast DCIS, ER/PR positive referred to medical oncology for additional recommendations.  She is already postlumpectomy with grade 2 intermediate DCIS measuring 1.5 mm, negative margins.   She has now completed adjuvant radiation. She will start anti estrogen therapy with aromatase inhibitors and is here to discuss about it. I have once discussed mechanism of action, adverse effects of anti estrogen therapy including but not limited to post menopausal symptoms such as hot flashes, vaginal dryness, arthralgias, bone density loss, increased risk of cardiovascular events. She will start first week of May and follow up with SCP in August.

## 2022-06-14 NOTE — Progress Notes (Signed)
[facility] CONSULT NOTE  Patient Care Team: Doreene Eland, MD as PCP - General (Family Medicine) Donnelly Angelica, RN as Oncology Nurse Navigator Pershing Proud, RN as Oncology Nurse Navigator Harriette Bouillon, MD as Consulting Physician (General Surgery) Rachel Moulds, MD as Consulting Physician (Hematology and Oncology) Dorothy Puffer, MD as Consulting Physician (Radiation Oncology)  CHIEF COMPLAINTS/PURPOSE OF CONSULTATION:  Newly diagnosed breast cancer  HISTORY OF PRESENTING ILLNESS:  Andrea [name] 67 y.o. female is here because of recent diagnosis of left breast cancer  I reviewed her records extensively and collaborated the history with the patient.  SUMMARY OF ONCOLOGIC HISTORY: Oncology History  DCIS (ductal carcinoma in situ) of breast  01/04/2022 Mammogram   Mammogram showed possible mass with associated calcifications in the left breast warranting further investigation.  Left breast diagnostic mammogram confirmed abnormality.  Ultrasound showed no sonographic correlate to the left breast mass.  No axillary adenopathy   01/24/2022 Pathology Results   Left breast needle core biopsy showed atypical ductal hyperplasia involving a papillary lesion.   03/16/2022 Definitive Surgery   Patient underwent left breast lumpectomy which showed a small focus of DCIS, intermediate grade, 1.5 mm, previous procedure related changes including focal squamous metaplasia, resection margins negative for DCIS.  Prognostic show ER +95% strong staining PR +95% strong staining   04/27/2022 Initial Diagnosis   DCIS (ductal carcinoma in situ) of breast    This is a very pleasant 67 year old female patient with history of femur fracture after a fall, hypothyroidism, obesity referred to hematology for evaluation and recommendations regarding left breast DCIS.  She underwent left breast lumpectomy which shows small focus of DCIS, intermediate grade measuring 1.5 mm.   She has  completed adjuvant radiation. She is here to initiate anti estrogen therapy. She has lot of pain given skin desquamation but is healing well. No fevers or chills. Rest of the pertinent 10 point ROS reviewed and negative.  MEDICAL HISTORY:  Past Medical History:  Diagnosis Date   Abnormal mammogram 08/28/2014   08/28/14 - Probable benign microcalcifications over the outer mid to lower left breast. Bi-Rads 3. F/U 6 months    Allergic rhinitis 06/22/2014   Arm pain, lateral, left 04/28/2016   Asthma    Breast cancer 03/16/2022   Carpal tunnel syndrome 04/26/2006   Qualifier: Diagnosis of  By: Irving Burton MD, Aaron     Femur fracture 05/14/2021   Femur fracture, right 05/14/2021   First degree AV block 09/22/2013   EKG 09/22/13    GANGLION CYST, WRIST, RIGHT 10/06/2008   Qualifier: Diagnosis of  By: Lanier Prude  MD, Taineisha     Hypothyroidism    Imbalance 01/12/2017   Palpitations 09/22/2013   Right wrist pain 04/28/2016    SURGICAL HISTORY: Past Surgical History:  Procedure Laterality Date   ABDOMINAL HYSTERECTOMY     fibroids   BREAST BIOPSY Left 01/24/2022   MM LT BREAST BX W LOC DEV 1ST LESION IMAGE BX SPEC STEREO GUIDE 01/24/2022 [facility]   BREAST BIOPSY  03/15/2022   MM LT RADIOACTIVE SEED LOC MAMMO GUIDE 03/15/2022 [facility]   BREAST LUMPECTOMY WITH RADIOACTIVE SEED LOCALIZATION Left 03/16/2022   Procedure: LEFT BREAST LUMPECTOMY WITH RADIOACTIVE SEED LOCALIZATION;  Surgeon: Harriette Bouillon, MD;  Location: [facility];  Service: General;  Laterality: Left;   FEMUR IM NAIL Right 05/14/2021   Procedure: INTRAMEDULLARY (IM) RETROGRADE FEMORAL NAILING;  Surgeon: Estill Bamberg, MD;  Location: [facility];  Service: Orthopedics;  Laterality:  Right;    SOCIAL HISTORY: Social History   Socioeconomic History   Marital status: Single    Spouse name: Not on file   Number of children: Not on file   Years of education: Not on file   Highest education level:  Not on file  Occupational History   Not on file  Tobacco Use   Smoking status: Never   Smokeless tobacco: Never   Tobacco comments:    Tobacco smoke comes in through the house vent from outside  Vaping Use   Vaping Use: Never used  Substance and Sexual Activity   Alcohol use: No   Drug use: No   Sexual activity: Never    Birth control/protection: Surgical  Other Topics Concern   Not on file  Social History Narrative   Works for [employer], 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[employer], concessions   Social Determinants of Health   Financial Resource Strain: Medium Risk (01/06/2019)   Overall Financial Resource Strain (CARDIA)    Difficulty of Paying Living Expenses: Somewhat hard  Food Insecurity: Food Insecurity Present (04/27/2022)   Hunger Vital Sign    Worried About Running Out of Food in the Last Year: Sometimes true    Ran Out of Food in the Last Year: Sometimes true  Transportation Needs: Unmet Transportation Needs (04/27/2022)   PRAPARE - Administrator, Civil Service (Medical): Yes    Lack of Transportation (Non-Medical): No  Physical Activity: Inactive (03/31/2022)   Exercise Vital Sign    Days of Exercise per Week: 0 days    Minutes of Exercise per Session: 0 min  Stress: Not on file  Social Connections: Not on file  Intimate Partner Violence: Not on file    FAMILY HISTORY: Family History  Problem Relation Age of Onset   Brain cancer Mother        cervial, pituitary, bone?   Hypertension Mother    Lung cancer Father    Alcohol abuse Father    Hypertension Sister    Hypothyroidism Sister    Hypertension Sister    Hypothyroidism Sister    Hypertension Sister    Hypothyroidism Sister    Hypertension Sister    Hypothyroidism Sister    Hypertension Sister    Hypothyroidism Sister    Prostate cancer Brother    Lung cancer Brother        lung   Ovarian cancer Maternal Aunt     ALLERGIES:  has No Known Allergies.  MEDICATIONS:   Current Outpatient Medications  Medication Sig Dispense Refill   [START ON 06/28/2022] anastrozole (ARIMIDEX) 1 MG tablet Take 1 tablet (1 mg total) by mouth daily. 90 tablet 3   albuterol (VENTOLIN HFA) 108 (90 Base) MCG/ACT inhaler Inhale 2 puffs into the lungs every 6 (six) hours as needed for wheezing or shortness of breath. 18 g 1   amLODipine (NORVASC) 5 MG tablet Take 1 tablet (5 mg total) by mouth at bedtime. 30 tablet 2   carvedilol (COREG) 6.25 MG tablet Take 1 tablet (6.25 mg total) by mouth 2 (two) times daily with a meal. 180 tablet 1   famotidine (PEPCID) 20 MG tablet Take 1 tablet (20 mg total) by mouth daily as needed for heartburn or indigestion. 90 tablet 1   ferrous sulfate 325 (65 FE) MG tablet Take 1 tablet (325 mg total) by mouth daily with breakfast. 30 tablet 3   fluticasone-salmeterol (ADVAIR HFA) 115-21 MCG/ACT inhaler Inhale 2 puffs into the  lungs 2 (two) times daily. 1 each 12   levothyroxine (SYNTHROID) 125 MCG tablet Take 1 tablet (125 mcg total) by mouth daily before breakfast. 90 tablet 1   loratadine (CLARITIN) 10 MG tablet Take 1 tablet (10 mg total) by mouth daily. (Patient not taking: Reported on 05/05/2022) 90 tablet 2   methocarbamol (ROBAXIN) 500 MG tablet Take 1 tablet (500 mg total) by mouth every 6 (six) hours as needed for muscle spasms. (Patient not taking: Reported on 07/12/2021) 30 tablet 0   Omega-3 Fatty Acids (FISH OIL) 1000 MG CAPS Take 1 capsule (1,000 mg total) by mouth in the morning and at bedtime. (Patient taking differently: Take 1,000 mg by mouth daily.) 60 capsule PRN   polyethylene glycol (MIRALAX / GLYCOLAX) 17 g packet Take 17 g by mouth 2 (two) times daily. (Patient not taking: Reported on 07/12/2021) 14 each 0   sertraline (ZOLOFT) 50 MG tablet Take 2 tablets (100 mg total) by mouth daily. 180 tablet 1   simvastatin (ZOCOR) 40 MG tablet Take 1 tablet (40 mg total) by mouth at bedtime. 90 tablet 1   Vitamin D, Cholecalciferol, 50 MCG (2000 UT)  CAPS Take 2,000 Units by mouth daily.     No current facility-administered medications for this visit.    REVIEW OF SYSTEMS:   Constitutional: Denies fevers, chills or abnormal night sweats Eyes: Denies blurriness of vision, double vision or watery eyes Ears, nose, mouth, throat, and face: Denies mucositis or sore throat Respiratory: Denies cough, dyspnea or wheezes Cardiovascular: Denies palpitation, chest discomfort or lower extremity swelling Gastrointestinal:  Denies nausea, heartburn or change in bowel habits Skin: Denies abnormal skin rashes Lymphatics: Denies new lymphadenopathy or easy bruising Neurological:Denies numbness, tingling or new weaknesses Behavioral/Psych: Mood is stable, no new changes  Breast: Denies any palpable lumps or discharge All other systems were reviewed with the patient and are negative.  PHYSICAL EXAMINATION: ECOG PERFORMANCE STATUS: 0 - Asymptomatic  Vitals:   06/14/22 1326  BP: (!) 145/88  Pulse: 71  Resp: 18  Temp: 97.9 F (36.6 C)  SpO2: 94%   Filed Weights   06/14/22 1326  Weight: 273 lb 14.4 oz (124.2 kg)    GENERAL:alert, no distress and comfortable BREAST: Left breast with skin desquamation and bright red granulation tissue. No concern for infection.  LABORATORY DATA:  I have reviewed the data as listed Lab Results  Component Value Date   WBC 5.7 04/18/2022   HGB 12.4 04/18/2022   HCT 37.9 04/18/2022   MCV 90.5 04/18/2022   PLT 199 04/18/2022   Lab Results  Component Value Date   NA 139 04/18/2022   K 3.6 04/18/2022   CL 102 04/18/2022   CO2 31 04/18/2022    RADIOGRAPHIC STUDIES: I have personally reviewed the radiological reports and agreed with the findings in the report.  ASSESSMENT AND PLAN:  DCIS (ductal carcinoma in situ) of breast This is a very pleasant 67 year old female patient with newly diagnosed left breast DCIS, ER/PR positive referred to medical oncology for additional recommendations.  She is  already postlumpectomy with grade 2 intermediate DCIS measuring 1.5 mm, negative margins.   She has now completed adjuvant radiation. She will start anti estrogen therapy with aromatase inhibitors and is here to discuss about it. I have once discussed mechanism of action, adverse effects of anti estrogen therapy including but not limited to post menopausal symptoms such as hot flashes, vaginal dryness, arthralgias, bone density loss, increased risk of cardiovascular events. She will  start first week of May and follow up with [facility] in August.      Skin desquamation Secondary to radiation Healing well No concern for infection    All questions were answered. The patient knows to call the clinic with any problems, questions or concerns.    Rachel Moulds, MD 06/14/22

## 2022-06-14 NOTE — Assessment & Plan Note (Signed)
Secondary to radiation Healing well No concern for infection

## 2022-06-19 ENCOUNTER — Ambulatory Visit: Payer: Self-pay | Admitting: Licensed Clinical Social Worker

## 2022-06-19 ENCOUNTER — Telehealth: Payer: Self-pay | Admitting: Licensed Clinical Social Worker

## 2022-06-19 ENCOUNTER — Encounter: Payer: Self-pay | Admitting: Licensed Clinical Social Worker

## 2022-06-19 DIAGNOSIS — Z1379 Encounter for other screening for genetic and chromosomal anomalies: Secondary | ICD-10-CM

## 2022-06-19 DIAGNOSIS — Z1211 Encounter for screening for malignant neoplasm of colon: Secondary | ICD-10-CM | POA: Insufficient documentation

## 2022-06-19 NOTE — Progress Notes (Signed)
HPI:   [name] was previously seen in the [facility] due to a personal and family history of cancer and concerns regarding a hereditary predisposition to cancer. Please refer to our prior cancer genetics clinic note for more information regarding our discussion, assessment and recommendations, at the time. [name]'s recent genetic test results were disclosed to her, as were recommendations warranted by these results. These results and recommendations are discussed in more detail below.  CANCER HISTORY:  Oncology History  DCIS (ductal carcinoma in situ) of breast  01/04/2022 Mammogram   Mammogram showed possible mass with associated calcifications in the left breast warranting further investigation.  Left breast diagnostic mammogram confirmed abnormality.  Ultrasound showed no sonographic correlate to the left breast mass.  No axillary adenopathy   01/24/2022 Pathology Results   Left breast needle core biopsy showed atypical ductal hyperplasia involving a papillary lesion.   03/16/2022 Definitive Surgery   Patient underwent left breast lumpectomy which showed a small focus of DCIS, intermediate grade, 1.5 mm, previous procedure related changes including focal squamous metaplasia, resection margins negative for DCIS.  Prognostic show ER +95% strong staining PR +95% strong staining   04/27/2022 Initial Diagnosis   DCIS (ductal carcinoma in situ) of breast    Genetic Testing   No pathogenic variants identified on the [facility] Multi-Cancer Panel. 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 The report date is 06/18/2022.  The Multi-Caner Panel offered by [facility] includes sequencing and/or deletion/duplication analysis of the following 70 genes:  AIP*, ALK, APC*, ATM*, AXIN2*, BAP1*, BARD1*, BLM*, BMPR1A*, BRCA1*, BRCA2*, BRIP1*, CDC73*, CDH1*, CDK4, CDKN1B*, CDKN2A, CHEK2*, CTNNA1*, DICER1*, EPCAM, EGFR, FH*, FLCN*, GREM1, HOXB13, KIT, LZTR1, MAX*, MBD4, MEN1*, MET, MITF,  MLH1*, MSH2*, MSH3*, MSH6*, MUTYH*, NF1*, NF2*, NTHL1*, PALB2*, PDGFRA, PMS2*, POLD1*, POLE*, POT1*, PRKAR1A*, PTCH1*, PTEN*, RAD51C*, RAD51D*, RB1*, RET, SDHA*, SDHAF2*, SDHB*, SDHC*, SDHD*, SMAD4*, SMARCA4*, SMARCB1*, SMARCE1*, STK11*, SUFU*, TMEM127*, TP53*, TSC1*, TSC2*, VHL*. RNA analysis was not completed as the sample did not meet quality metrics.     FAMILY HISTORY:  We obtained a detailed, 4-generation family history.  Significant diagnoses are listed below: Family History  Problem Relation Age of Onset   Brain cancer Mother        cervial, pituitary, bone?   Hypertension Mother    Lung cancer Father    Alcohol abuse Father    Hypertension Sister    Hypothyroidism Sister    Hypertension Sister    Hypothyroidism Sister    Hypertension Sister    Hypothyroidism Sister    Hypertension Sister    Hypothyroidism Sister    Hypertension Sister    Hypothyroidism Sister    Prostate cancer Brother    Lung cancer Brother        lung   Ovarian cancer Maternal Aunt    [name] has 1 daughter, 33. She has 5 sisters and 3 brothers. One brother recently died of prostate cancer at 89. Another brother died of lung cancer.   [name]'s mother died of brain cancer at 81. She had a maternal half sister that died of ovarian cancer in her 18s. No other known cancers on this side of the family.   [name] father died of lung cancer. She possibly had two aunts that died of cancer as well, likely lung cancer. No other known cancers on this side of the family.   [name] is unaware of previous family history of genetic testing for hereditary cancer risks. There is no  reported Ashkenazi Jewish ancestry. There is no known consanguinity.      GENETIC TEST RESULTS:  The [facility] Multi-Cancer Panel found no pathogenic mutations.   The Multi-Cancer Panel offered by [facility] includes sequencing and/or deletion/duplication analysis of the following 70 genes:  AIP*, ALK, APC*, ATM*, AXIN2*,  BAP1*, BARD1*, BLM*, BMPR1A*, BRCA1*, BRCA2*, BRIP1*, CDC73*, CDH1*, CDK4, CDKN1B*, CDKN2A, CHEK2*, CTNNA1*, DICER1*, EPCAM, EGFR, FH*, FLCN*, GREM1, HOXB13, KIT, LZTR1, MAX*, MBD4, MEN1*, MET, MITF, MLH1*, MSH2*, MSH3*, MSH6*, MUTYH*, NF1*, NF2*, NTHL1*, PALB2*, PDGFRA, PMS2*, POLD1*, POLE*, POT1*, PRKAR1A*, PTCH1*, PTEN*, RAD51C*, RAD51D*, RB1*, RET, SDHA*, SDHAF2*, SDHB*, SDHC*, SDHD*, SMAD4*, SMARCA4*, SMARCB1*, SMARCE1*, STK11*, SUFU*, TMEM127*, TP53*, TSC1*, TSC2*, VHL*. RNA analysis was not completed as the sample did not meet quality metrics.  The test report has been scanned into EPIC and is located under the Molecular Pathology section of the Results Review tab.  A portion of the result report is included below for reference. Genetic testing reported out on 06/18/2022.      Genetic testing identified a variant of uncertain significance (VUS) in the PTCH1 gene called c.3331A>G.  At this time, it is unknown if this variant is associated with an increased risk for cancer or if it is benign, but most uncertain variants are reclassified to benign. It should not be used to make medical management decisions. With time, we suspect the laboratory will determine the significance of this variant, if any. If the laboratory reclassifies this variant, we will attempt to contact [name] to discuss it further.   Even though a pathogenic variant was not identified, possible explanations for the cancer in the family may include: There may be no hereditary risk for cancer in the family. The cancers in [name] and/or her family may be sporadic/familial or due to other genetic and environmental factors. There may be a gene mutation in one of these genes that current testing methods cannot detect but that chance is small. There could be another gene that has not yet been discovered, or that we have not yet tested, that is responsible for the cancer diagnoses in the family.  It is also possible there is a  hereditary cause for the cancer in the family that [name] did not inherit.  Therefore, it is important to remain in touch with cancer genetics in the future so that we can continue to offer [name] the most up to date genetic testing.   ADDITIONAL GENETIC TESTING:  We discussed with [name] that her genetic testing was fairly extensive.  If there are additional relevant genes identified to increase cancer risk that can be analyzed in the future, we would be happy to discuss and coordinate this testing at that time.    CANCER SCREENING RECOMMENDATIONS:  [name] test result is considered negative (normal).  This means that we have not identified a hereditary cause for her personal and family history of cancer at this time.   An individual's cancer risk and medical management are not determined by genetic test results alone. Overall cancer risk assessment incorporates additional factors, including personal medical history, family history, and any available genetic information that may result in a personalized plan for cancer prevention and surveillance. Therefore, it is recommended she continue to follow the cancer management and screening guidelines provided by her oncology and primary healthcare provider.  RECOMMENDATIONS FOR FAMILY MEMBERS:   Since she did not inherit a identifiable mutation in a cancer predisposition gene included on this panel, her children could not  have inherited a known mutation from her in one of these genes. Individuals in this family might be at some increased risk of developing cancer, over the general population risk, due to the family history of cancer.  Individuals in the family should notify their providers of the family history of cancer. We recommend women in this family have a yearly mammogram beginning at age 54, or 49 years younger than the earliest onset of cancer, an annual clinical breast exam, and perform monthly breast self-exams.  Family members  should have colonoscopies by at age 36, or earlier, as recommended by their providers. Other members of the family may still carry a pathogenic variant in one of these genes that Ms. Haese did not inherit. Based on the family history, we recommend  those related to her aunt who had ovarian cancer have genetic counseling and testing. Ms. Tino will let us know if we can be of any assistance in coordinating genetic counseling and/or testing for this family member.   We do not recommend familial testing for the PTCH1 variant of uncertain significance (VUS).  FOLLOW-UP:  Lastly, we discussed with Ms. Rankin that cancer genetics is a rapidly advancing field and it is possible that new genetic tests will be appropriate for her and/or her family members in the future. We encouraged her to remain in contact with cancer genetics on an annual basis so we can update her personal and family histories and let her know of advances in cancer genetics that may benefit this family.   Our contact number was provided. Ms. Fikes's questions were answered to her satisfaction, and she knows she is welcome to call us at anytime with additional questions or concerns.    Lacy Duverney, MS, [facility] Genetic Counselor [email or web address] Phone: [phone]

## 2022-06-19 NOTE — Telephone Encounter (Signed)
I contacted Ms. Laflam to discuss her genetic testing results. No pathogenic variants were identified in the 70 genes analyzed. Detailed clinic note to follow.   The test report has been scanned into EPIC and is located under the Molecular Pathology section of the Results Review tab.  A portion of the result report is included below for reference.       Lacy Duverney, MS, [facility] Genetic Counselor [email or web address] Phone: [phone]

## 2022-07-04 ENCOUNTER — Ambulatory Visit (INDEPENDENT_AMBULATORY_CARE_PROVIDER_SITE_OTHER): Payer: 59 | Admitting: Family Medicine

## 2022-07-04 ENCOUNTER — Encounter: Payer: Self-pay | Admitting: Family Medicine

## 2022-07-04 VITALS — BP 128/89 | HR 87 | Ht 63.0 in | Wt 272.8 lb

## 2022-07-04 DIAGNOSIS — E785 Hyperlipidemia, unspecified: Secondary | ICD-10-CM | POA: Diagnosis not present

## 2022-07-04 DIAGNOSIS — R7303 Prediabetes: Secondary | ICD-10-CM

## 2022-07-04 DIAGNOSIS — I10 Essential (primary) hypertension: Secondary | ICD-10-CM

## 2022-07-04 LAB — POCT GLYCOSYLATED HEMOGLOBIN (HGB A1C): HbA1c, POC (prediabetic range): 6 % (ref 5.7–6.4)

## 2022-07-04 MED ORDER — ROSUVASTATIN CALCIUM 10 MG PO TABS: 10.0000 mg | ORAL_TABLET | Freq: Every day | ORAL | 1 refills | Status: DC

## 2022-07-04 NOTE — Progress Notes (Signed)
    SUBJECTIVE:   CHIEF COMPLAINT / HPI:   HTN: Here for follow up. She is complaint with Coreg 6.25 mg BID and Norvasc 5 mg QD.   PreDM: Here for follow up.  HLD: She is compliant with her Zocor 40 mg qd. She is here to discuss lab report. No concerns.   PERTINENT  PMH / PSH: PMHx reviewed.  OBJECTIVE:   BP 128/89   Pulse 87   Ht 5\' 3"  (1.6 m)   Wt 272 lb 12.8 oz (123.7 kg)   BMI 48.32 kg/m   Physical Exam Vitals and nursing note reviewed.  Cardiovascular:     Rate and Rhythm: Normal rate and regular rhythm.     Heart sounds: Normal heart sounds. No murmur heard. Pulmonary:     Effort: Pulmonary effort is normal. No respiratory distress.     Breath sounds: Normal breath sounds.  Musculoskeletal:     Right lower leg: No edema.     Left lower leg: No edema.      ASSESSMENT/PLAN:   HYPERTENSION, BENIGN SYSTEMIC BP looks great today.  Prediabetes A1C checked today. Looks reassuring. Continue Lifestyle modification.    Hyperlipidemia LDL above goal despite compliance with Zocor. Discussed switching to Crestor and she agreed with the plan. She will start Crestor once she is out of her Zocor.      Janit Pagan, MD [facility]

## 2022-07-04 NOTE — Assessment & Plan Note (Signed)
A1C checked today. Looks reassuring. Continue Lifestyle modification.

## 2022-07-04 NOTE — Assessment & Plan Note (Signed)
BP looks great today!!  

## 2022-07-04 NOTE — Assessment & Plan Note (Signed)
LDL above goal despite compliance with Zocor. Discussed switching to Crestor and she agreed with the plan. She will start Crestor once she is out of her Zocor.

## 2022-07-04 NOTE — Patient Instructions (Addendum)
Rosuvastatin Tablets What is this medication? ROSUVASTATIN (roe SOO va sta tin) treats high cholesterol and reduces the risk of heart attack and stroke. It works by decreasing bad cholesterol and fats (such as LDL, triglycerides) and increasing good cholesterol (HDL) in your blood. It belongs to a group of medications called statins. Changes to diet and exercise are often combined with this medication. This medicine may be used for other purposes; ask your health care provider or pharmacist if you have questions. COMMON BRAND NAME(S): Crestor What should I tell my care team before I take this medication? They need to know if you have any of these conditions: Diabetes Frequently drink alcohol Kidney disease Liver disease Muscle cramps, pain Stroke Thyroid disease An unusual or allergic reaction to rosuvastatin, other medications, foods, dyes, or preservatives Pregnant or trying to get pregnant Breastfeeding How should I use this medication? Take this medication by mouth with a glass of water. Follow the directions on the prescription label. You can take it with or without food. If it upsets your stomach, take it with food. Do not cut, crush or chew this medication. Swallow the tablets whole. Take your medication at regular intervals. Do not take it more often than directed. Take antacids that have a combination of aluminum and magnesium hydroxide in them at a different time of day than this medication. Take these products 2 hours AFTER this medication. Talk to your care team about the use of this medication in children. While this medication may be prescribed for children as young as 7 for selected conditions, precautions do apply. Overdosage: If you think you have taken too much of this medicine contact a poison control center or emergency room at once. NOTE: This medicine is only for you. Do not share this medicine with others. What if I miss a dose? If you miss a dose, take it as soon as  you can. If your next dose is to be taken in less than 12 hours, then do not take the missed dose. Take the next dose at your regular time. Do not take double or extra doses. What may interact with this medication? Do not take this medication with any of the following: Supplements like red yeast rice This medication may also interact with the following: Alcohol Antacids containing aluminum hydroxide and magnesium hydroxide Cyclosporine Other medications for high cholesterol Some medications for HIV infection Warfarin This list may not describe all possible interactions. Give your health care provider a list of all the medicines, herbs, non-prescription drugs, or dietary supplements you use. Also tell them if you smoke, drink alcohol, or use illegal drugs. Some items may interact with your medicine. What should I watch for while using this medication? Visit your care team for regular checks on your progress. Tell your care team if your symptoms do not start to get better or if they get worse. Your care team may tell you to stop taking this medication if you develop muscle problems. If your muscle problems do not go away after stopping this medication, contact your care team. Talk to your care team if you may be pregnant. Serious birth defects can occur if you take this medication during pregnancy. Talk to your care team before breastfeeding. Changes to your treatment plan may be needed. This medication may increase blood sugar. Ask your care team if changes in diet or medications are needed if you have diabetes. If you are going to need surgery or other procedure, tell your care team that you   are using this medication. Taking this medication is only part of a total heart healthy program. Ask your care team if there are other changes you can make to improve your overall health. What side effects may I notice from receiving this medication? Side effects that you should report to your care team as  soon as possible: Allergic reactions--skin rash, itching, hives, swelling of the face, lips, tongue, or throat High blood sugar (hyperglycemia)--increased thirst or amount of urine, unusual weakness, fatigue, blurry vision Liver injury--right upper belly pain, loss of appetite, nausea, light-colored stool, dark yellow or brown urine, yellowing skin or eyes, unusual weakness, fatigue Muscle injury--unusual weakness, fatigue, muscle pain, dark yellow or brown urine, decrease in amount of urine Redness, blistering, peeling or loosening of the skin, including inside the mouth Side effects that usually do not require medical attention (report to your care team if they continue or are bothersome): Fatigue Headache Nausea Stomach pain This list may not describe all possible side effects. Call your doctor for medical advice about side effects. You may report side effects to FDA at 1-800-FDA-1088. Where should I keep my medication? Keep out of the reach of children and pets. Store between 20 and 25 degrees C (68 and 77 degrees F). Get rid of any unused medication after the expiration date. To get rid of medications that are no longer needed or have expired: Take the medication to a medication take-back program. Check with your pharmacy or law enforcement to find a location. If you cannot return the medication, check the label or package insert to see if the medication should be thrown out in the garbage or flushed down the toilet. If you are not sure, ask your care team. If it is safe to put it in the trash, take the medication out of the container. Mix the medication with cat litter, dirt, coffee grounds, or other unwanted substance. Seal the mixture in a bag or container. Put it in the trash. NOTE: This sheet is a summary. It may not cover all possible information. If you have questions about this medicine, talk to your doctor, pharmacist, or health care provider.  2023 Elsevier/Gold Standard  (2019-12-27 00:00:00)  

## 2022-07-06 ENCOUNTER — Ambulatory Visit (HOSPITAL_COMMUNITY): Admit: 2022-07-06 | Payer: 59 | Admitting: Gastroenterology

## 2022-07-06 ENCOUNTER — Encounter (HOSPITAL_COMMUNITY): Payer: Self-pay

## 2022-07-06 SURGERY — COLONOSCOPY WITH PROPOFOL
Anesthesia: Monitor Anesthesia Care

## 2022-07-11 DIAGNOSIS — M6281 Muscle weakness (generalized): Secondary | ICD-10-CM | POA: Diagnosis not present

## 2022-07-11 DIAGNOSIS — S7291XB Unspecified fracture of right femur, initial encounter for open fracture type I or II: Secondary | ICD-10-CM | POA: Diagnosis not present

## 2022-07-11 DIAGNOSIS — J984 Other disorders of lung: Secondary | ICD-10-CM | POA: Diagnosis not present

## 2022-07-11 DIAGNOSIS — N182 Chronic kidney disease, stage 2 (mild): Secondary | ICD-10-CM | POA: Diagnosis not present

## 2022-07-18 ENCOUNTER — Encounter: Payer: 59 | Admitting: Gastroenterology

## 2022-07-28 NOTE — Progress Notes (Signed)
  Radiation Oncology         [phone] ________________________________  Name: Andrea [name] MRN: [id number]  Date of Service: 07/31/2022  DOB: 27-Dec-1955  Post Treatment Telephone Note  Diagnosis:   Intermediate grade, ER/PR positive DCIS of the left breast.   (as documented in provider EOT note)   The patient was available for call today.   Symptoms of fatigue have improved since completing therapy.  Symptoms of skin changes have improved mildly since completing therapy, but is still experiencing some minor skin discomfort 3/10.  Patient reports LT breast pain 4/10, improving.  The patient was encouraged to avoid sun exposure in the area of prior treatment for up to one year following radiation with either sunscreen or by the style of clothing worn in the sun.  The patient has scheduled follow up with her medical oncologist Dr. Al Pimple  for ongoing surveillance, and was encouraged to call if she develops concerns or questions regarding radiation.   This concludes the interview.   Ruel Favors, LPN

## 2022-07-31 ENCOUNTER — Ambulatory Visit
Admission: RE | Admit: 2022-07-31 | Discharge: 2022-07-31 | Disposition: A | Discharge status: Home / Self Care | Payer: 59 | Source: Ambulatory Visit | Attending: Hematology and Oncology | Admitting: Hematology and Oncology

## 2022-08-11 DIAGNOSIS — N182 Chronic kidney disease, stage 2 (mild): Secondary | ICD-10-CM | POA: Diagnosis not present

## 2022-08-11 DIAGNOSIS — J984 Other disorders of lung: Secondary | ICD-10-CM | POA: Diagnosis not present

## 2022-08-11 DIAGNOSIS — M6281 Muscle weakness (generalized): Secondary | ICD-10-CM | POA: Diagnosis not present

## 2022-08-11 DIAGNOSIS — S7291XB Unspecified fracture of right femur, initial encounter for open fracture type I or II: Secondary | ICD-10-CM | POA: Diagnosis not present

## 2022-09-10 DIAGNOSIS — N182 Chronic kidney disease, stage 2 (mild): Secondary | ICD-10-CM | POA: Diagnosis not present

## 2022-09-10 DIAGNOSIS — J984 Other disorders of lung: Secondary | ICD-10-CM | POA: Diagnosis not present

## 2022-09-10 DIAGNOSIS — M6281 Muscle weakness (generalized): Secondary | ICD-10-CM | POA: Diagnosis not present

## 2022-09-10 DIAGNOSIS — S7291XB Unspecified fracture of right femur, initial encounter for open fracture type I or II: Secondary | ICD-10-CM | POA: Diagnosis not present

## 2022-09-18 ENCOUNTER — Other Ambulatory Visit: Payer: Self-pay | Admitting: Family Medicine

## 2022-09-25 ENCOUNTER — Telehealth: Payer: Self-pay | Admitting: Adult Health

## 2022-09-25 ENCOUNTER — Encounter: Payer: Self-pay | Admitting: Adult Health

## 2022-09-25 ENCOUNTER — Inpatient Hospital Stay: Payer: 59 | Attending: Adult Health | Admitting: Adult Health

## 2022-09-25 ENCOUNTER — Other Ambulatory Visit: Payer: Self-pay

## 2022-09-25 VITALS — BP 97/79 | HR 89 | Temp 97.7°F | Resp 18 | Wt 272.3 lb

## 2022-09-25 DIAGNOSIS — Z923 Personal history of irradiation: Secondary | ICD-10-CM | POA: Diagnosis not present

## 2022-09-25 DIAGNOSIS — D0512 Intraductal carcinoma in situ of left breast: Secondary | ICD-10-CM | POA: Diagnosis not present

## 2022-09-25 DIAGNOSIS — Z79811 Long term (current) use of aromatase inhibitors: Secondary | ICD-10-CM | POA: Insufficient documentation

## 2022-09-25 NOTE — Progress Notes (Signed)
SURVIVORSHIP VISIT:  BRIEF ONCOLOGIC HISTORY:  Oncology History  DCIS (ductal carcinoma in situ) of breast  01/04/2022 Mammogram   Mammogram showed possible mass with associated calcifications in the left breast warranting further investigation.  Left breast diagnostic mammogram confirmed abnormality.  Ultrasound showed no sonographic correlate to the left breast mass.  No axillary adenopathy   01/24/2022 Pathology Results   Left breast needle core biopsy showed atypical ductal hyperplasia involving a papillary lesion.   03/16/2022 Definitive Surgery   Patient underwent left breast lumpectomy which showed a small focus of DCIS, intermediate grade, 1.5 mm, previous procedure related changes including focal squamous metaplasia, resection margins negative for DCIS.  Prognostic show ER +95% strong staining PR +95% strong staining    Genetic Testing   No pathogenic variants identified on the [facility] Multi-Cancer Panel. 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 The report date is 06/18/2022.  The Multi-Caner Panel offered by [facility] includes sequencing and/or deletion/duplication analysis of the following 70 genes:  AIP*, ALK, APC*, ATM*, AXIN2*, BAP1*, BARD1*, BLM*, BMPR1A*, BRCA1*, BRCA2*, BRIP1*, CDC73*, CDH1*, CDK4, CDKN1B*, CDKN2A, CHEK2*, CTNNA1*, DICER1*, EPCAM, EGFR, FH*, FLCN*, GREM1, HOXB13, KIT, LZTR1, MAX*, MBD4, MEN1*, MET, MITF, MLH1*, MSH2*, MSH3*, MSH6*, MUTYH*, NF1*, NF2*, NTHL1*, PALB2*, PDGFRA, PMS2*, POLD1*, POLE*, POT1*, PRKAR1A*, PTCH1*, PTEN*, RAD51C*, RAD51D*, RB1*, RET, SDHA*, SDHAF2*, SDHB*, SDHC*, SDHD*, SMAD4*, SMARCA4*, SMARCB1*, SMARCE1*, STK11*, SUFU*, TMEM127*, TP53*, TSC1*, TSC2*, VHL*. RNA analysis was not completed as the sample did not meet quality metrics.   05/11/2022 - 06/07/2022 Radiation Therapy     The patient initially received a dose of 42.56 Gy in 16 fractions to the left breast using whole-breast tangent fields. This was delivered using a 3-D conformal  technique. The patient then received a boost to the seroma. This delivered an additional 8 Gy in 31fractions using a 3 field photon technique due to the depth of the seroma. The total dose was 50.56 Gy.    09/25/2022 Cancer Staging   Staging form: Breast, AJCC 8th Edition - Pathologic: Stage 0 (pTis (DCIS), pN0, cM0, ER+, PR+) - Signed by Loa Socks, NP on 09/25/2022 Stage prefix: Initial diagnosis     INTERVAL HISTORY:  Andrea [name] to review her survivorship care plan detailing her treatment course for breast cancer, as well as monitoring long-term side effects of that treatment, education regarding health maintenance, screening, and overall wellness and health promotion.     Overall, Andrea [name] reports feeling quite well.  She is taking Anastrozole daily with good tolerance.  She is fatigued, and continues to have some left breast tenderness and peeling underneath her left breast.  Otherwise she is feeling well.    REVIEW OF SYSTEMS:  Review of Systems  Constitutional:  Positive for fatigue. Negative for appetite change, chills, fever and unexpected weight change.  HENT:   Negative for hearing loss, lump/mass and trouble swallowing.   Eyes:  Negative for eye problems and icterus.  Respiratory:  Negative for chest tightness, cough and shortness of breath.   Cardiovascular:  Negative for chest pain, leg swelling and palpitations.  Gastrointestinal:  Negative for abdominal distention, abdominal pain, constipation, diarrhea, nausea and vomiting.  Endocrine: Negative for hot flashes.  Genitourinary:  Negative for difficulty urinating.   Musculoskeletal:  Negative for arthralgias.  Skin:  Negative for itching and rash.  Neurological:  Negative for dizziness, extremity weakness, headaches and numbness.  Hematological:  Negative for adenopathy. Does not bruise/bleed easily.  Psychiatric/Behavioral:  Negative for depression. The patient is not nervous/anxious.  Breast: Denies any  new nodularity, masses, tenderness, nipple changes, or nipple discharge.       PAST MEDICAL/SURGICAL HISTORY:  Past Medical History:  Diagnosis Date   Abnormal mammogram 08/28/2014   08/28/14 - Probable benign microcalcifications over the outer mid to lower left breast. Bi-Rads 3. F/U 6 months    Allergic rhinitis 06/22/2014   Arm pain, lateral, left 04/28/2016   Asthma    Breast cancer (HCC) 03/16/2022   Carpal tunnel syndrome 04/26/2006   Qualifier: Diagnosis of  By: Irving Burton MD, Aaron     Femur fracture [facility]) 05/14/2021   Femur fracture, right (HCC) 05/14/2021   First degree AV block 09/22/2013   EKG 09/22/13    GANGLION CYST, WRIST, RIGHT 10/06/2008   Qualifier: Diagnosis of  By: Lanier Prude  MD, Taineisha     Hypothyroidism    Imbalance 01/12/2017   Palpitations 09/22/2013   Right wrist pain 04/28/2016   Past Surgical History:  Procedure Laterality Date   ABDOMINAL HYSTERECTOMY     fibroids   BREAST BIOPSY Left 01/24/2022   MM LT BREAST BX W LOC DEV 1ST LESION IMAGE BX SPEC STEREO GUIDE 01/24/2022 [facility]   BREAST BIOPSY  03/15/2022   MM LT RADIOACTIVE SEED LOC MAMMO GUIDE 03/15/2022 [facility]MO[facility]   BREAST LUMPECTOMY WITH RADIOACTIVE SEED LOCALIZATION Left 03/16/2022   Procedure: LEFT BREAST LUMPECTOMY WITH RADIOACTIVE SEED LOCALIZATION;  Surgeon: Harriette Bouillon, MD;  Location: [facility];  Service: General;  Laterality: Left;   FEMUR IM NAIL Right 05/14/2021   Procedure: INTRAMEDULLARY (IM) RETROGRADE FEMORAL NAILING;  Surgeon: Estill Bamberg, MD;  Location: [facility];  Service: Orthopedics;  Laterality: Right;     ALLERGIES:  No Known Allergies   CURRENT MEDICATIONS:  Outpatient Encounter Medications as of 09/25/2022  Medication Sig Note   albuterol (VENTOLIN HFA) 108 (90 Base) MCG/ACT inhaler Inhale 2 puffs into the lungs every 6 (six) hours as needed for wheezing or shortness of breath.    amLODipine (NORVASC) 5 MG tablet TAKE 1 TABLET BY  MOUTH AT BEDTIME    anastrozole (ARIMIDEX) 1 MG tablet Take 1 tablet (1 mg total) by mouth daily.    carvedilol (COREG) 6.25 MG tablet Take 1 tablet (6.25 mg total) by mouth 2 (two) times daily with a meal.    famotidine (PEPCID) 20 MG tablet Take 1 tablet (20 mg total) by mouth daily as needed for heartburn or indigestion.    ferrous sulfate 325 (65 FE) MG tablet Take 1 tablet (325 mg total) by mouth daily with breakfast. 03/09/2022: Not taking    fluticasone-salmeterol (ADVAIR HFA) 115-21 MCG/ACT inhaler Inhale 2 puffs into the lungs 2 (two) times daily.    levothyroxine (SYNTHROID) 125 MCG tablet Take 1 tablet (125 mcg total) by mouth daily before breakfast.    loratadine (CLARITIN) 10 MG tablet Take 1 tablet (10 mg total) by mouth daily.    methocarbamol (ROBAXIN) 500 MG tablet Take 1 tablet (500 mg total) by mouth every 6 (six) hours as needed for muscle spasms.    Omega-3 Fatty Acids (FISH OIL) 1000 MG CAPS Take 1 capsule (1,000 mg total) by mouth in the morning and at bedtime. (Patient taking differently: Take 1,000 mg by mouth daily.)    polyethylene glycol (MIRALAX / GLYCOLAX) 17 g packet Take 17 g by mouth 2 (two) times daily.    rosuvastatin (CRESTOR) 10 MG tablet Take 1 tablet (10 mg total) by mouth daily.    sertraline (ZOLOFT) 50 MG tablet  Take 2 tablets (100 mg total) by mouth daily.    Vitamin D, Cholecalciferol, 50 MCG (2000 UT) CAPS Take 2,000 Units by mouth daily.    No facility-administered encounter medications on file as of 09/25/2022.     ONCOLOGIC FAMILY HISTORY:  Family History  Problem Relation Age of Onset   Brain cancer Mother        cervial, pituitary, bone?   Hypertension Mother    Lung cancer Father    Alcohol abuse Father    Hypertension Sister    Hypothyroidism Sister    Hypertension Sister    Hypothyroidism Sister    Hypertension Sister    Hypothyroidism Sister    Hypertension Sister    Hypothyroidism Sister    Hypertension Sister    Hypothyroidism  Sister    Prostate cancer Brother    Lung cancer Brother        lung   Ovarian cancer Maternal Aunt      SOCIAL HISTORY:  Social History   Socioeconomic History   Marital status: Single    Spouse name: Not on file   Number of children: Not on file   Years of education: Not on file   Highest education level: Not on file  Occupational History   Not on file  Tobacco Use   Smoking status: Never   Smokeless tobacco: Never   Tobacco comments:    Tobacco smoke comes in through the house vent from outside  Vaping Use   Vaping status: Never Used  Substance and Sexual Activity   Alcohol use: No   Drug use: No   Sexual activity: Never    Birth control/protection: Surgical  Other Topics Concern   Not on file  Social History Narrative   Works for [employer], 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[employer], concessions   Social Determinants of Health   Financial Resource Strain: Medium Risk (01/06/2019)   Overall Financial Resource Strain (CARDIA)    Difficulty of Paying Living Expenses: Somewhat hard  Food Insecurity: Food Insecurity Present (04/27/2022)   Hunger Vital Sign    Worried About Running Out of Food in the Last Year: Sometimes true    Ran Out of Food in the Last Year: Sometimes true  Transportation Needs: Unmet Transportation Needs (04/27/2022)   PRAPARE - Administrator, Civil Service (Medical): Yes    Lack of Transportation (Non-Medical): No  Physical Activity: Inactive (03/31/2022)   Exercise Vital Sign    Days of Exercise per Week: 0 days    Minutes of Exercise per Session: 0 min  Stress: Not on file  Social Connections: Not on file  Intimate Partner Violence: Not on file     OBSERVATIONS/OBJECTIVE:  BP 97/79 (BP Location: Left Arm, Patient Position: Sitting)   Pulse 89   Temp 97.7 F (36.5 C) (Temporal)   Resp 18   Wt 272 lb 4.8 oz (123.5 kg)   SpO2 91%   BMI 48.24 kg/m  GENERAL: Patient is a well appearing female in no acute  distress HEENT:  Sclerae anicteric.  Oropharynx clear and moist. No ulcerations or evidence of oropharyngeal candidiasis. Neck is supple.  NODES:  No cervical, supraclavicular, or axillary lymphadenopathy palpated.  BREAST EXAM: Left breast status postlumpectomy and radiation, radiation changes noted, there is moist desquamation that is healing well underneath her left breast, no sign of infection noted.  LUNGS:  Clear to auscultation bilaterally.  No wheezes or rhonchi. HEART:  Regular rate and rhythm.  No murmur appreciated. ABDOMEN:  Soft, nontender.  Positive, normoactive bowel sounds. No organomegaly palpated. MSK:  No focal spinal tenderness to palpation. Full range of motion bilaterally in the upper extremities. EXTREMITIES:  No peripheral edema.   SKIN:  Clear with no obvious rashes or skin changes. No nail dyscrasia. NEURO:  Nonfocal. Well oriented.  Appropriate affect.   LABORATORY DATA:  None for this visit.  DIAGNOSTIC IMAGING:  None for this visit.      ASSESSMENT AND PLAN:  Ms.. [name] is a pleasant 67 y.o. female with Stage 0 left breast DCIS, ER+/PR+, diagnosed in 02/2022, treated with lumpectomy, adjuvant radiation therapy, and anti-estrogen therapy with Anastrozole beginning in 06/2022.  She presents to the Survivorship Clinic for our initial meeting and routine follow-up post-completion of treatment for breast cancer.    1. Stage 0 left breast cancer:  Andrea [name] is continuing to recover from definitive treatment for breast cancer. She will follow-up with her medical oncologist, Dr.  Al Pimple in 6 months with history and physical exam per surveillance protocol.  She will continue her anti-estrogen therapy with Anastrozole. Thus far, she is tolerating the Anastrozole well, with minimal side effects. Her mammogram is due 12/2022; orders placed today.   Today, a comprehensive survivorship care plan and treatment summary was reviewed with the patient today detailing her breast  cancer diagnosis, treatment course, potential late/long-term effects of treatment, appropriate follow-up care with recommendations for the future, and patient education resources.  A copy of this summary, along with a letter will be sent to the patient's primary care provider via mail/fax/In Basket message after today's visit.    2. Radiation changes to left breast: I recommended she continue to apply her cream and consider adding non-adhesive bandages under her breast to prevent the moisture from becoming hot and creating a yeast prone environment.  The area is healing well, and as it continues to heal any discomfort should improve as well.    3. Bone health:  Given Andrea [name]'s age/history of breast cancer and her current treatment regimen including anti-estrogen therapy with Anastrozole, she is at risk for bone demineralization.  Her last DEXA scan was 01/04/2022 demonstrated osteopenia with a T-score of -1.8.  Repeat testing is recommended to occur in November 2025.  She was given education on specific activities to promote bone health.  4. Cancer screening:  Due to Andrea [name]'s history and her age, she should receive screening for skin cancers, colon cancer, and gynecologic cancers.  The information and recommendations are listed on the patient's comprehensive care plan/treatment summary and were reviewed in detail with the patient.    5. Health maintenance and wellness promotion: Andrea [name] was encouraged to consume 5-7 servings of fruits and vegetables per day. We reviewed the "Nutrition Rainbow" handout.  She was also encouraged to engage in moderate to vigorous exercise for 30 minutes per day most days of the week.  She was instructed to limit her alcohol consumption and continue to abstain from tobacco use.   6. Support services/counseling: It is not uncommon for this period of the patient's cancer care trajectory to be one of many emotions and stressors.   She was given information regarding our  available services and encouraged to contact me with any questions or for help enrolling in any of our support group/programs.    Follow up instructions:    -Return to cancer center in 6 months for follow-up with Dr. Al Pimple -Mammogram due in November 2024 -Bone density due in  November 2025. -She is welcome to return back to the Survivorship Clinic at any time; no additional follow-up needed at this time.  -Consider referral back to survivorship as a long-term survivor for continued surveillance  The patient was provided an opportunity to ask questions and all were answered. The patient agreed with the plan and demonstrated an understanding of the instructions.   Total encounter time:40 minutes*in face-to-face visit time, chart review, lab review, care coordination, order entry, and documentation of the encounter time.    Lillard Anes, NP 09/25/22 12:38 PM Medical Oncology and Hematology [facility] [address] [place], [place] [address] Tel. [phone]    Fax. [phone]  *Total Encounter Time as defined by the Centers for Medicare and Medicaid Services includes, in addition to the face-to-face time of a patient visit (documented in the note above) non-face-to-face time: obtaining and reviewing outside history, ordering and reviewing medications, tests or procedures, care coordination (communications with other health care professionals or caregivers) and documentation in the medical record.

## 2022-09-25 NOTE — Telephone Encounter (Signed)
Scheduled appointment per 7/29 los. Left voicemail for patient.

## 2022-10-09 DIAGNOSIS — D0512 Intraductal carcinoma in situ of left breast: Secondary | ICD-10-CM | POA: Diagnosis not present

## 2022-10-11 DIAGNOSIS — J984 Other disorders of lung: Secondary | ICD-10-CM | POA: Diagnosis not present

## 2022-10-11 DIAGNOSIS — M6281 Muscle weakness (generalized): Secondary | ICD-10-CM | POA: Diagnosis not present

## 2022-10-11 DIAGNOSIS — S7291XB Unspecified fracture of right femur, initial encounter for open fracture type I or II: Secondary | ICD-10-CM | POA: Diagnosis not present

## 2022-10-11 DIAGNOSIS — N182 Chronic kidney disease, stage 2 (mild): Secondary | ICD-10-CM | POA: Diagnosis not present

## 2022-10-16 ENCOUNTER — Encounter: Payer: 59 | Admitting: Adult Health

## 2022-11-07 ENCOUNTER — Other Ambulatory Visit: Payer: Self-pay | Admitting: Family Medicine

## 2022-11-11 DIAGNOSIS — S7291XB Unspecified fracture of right femur, initial encounter for open fracture type I or II: Secondary | ICD-10-CM | POA: Diagnosis not present

## 2022-11-11 DIAGNOSIS — M6281 Muscle weakness (generalized): Secondary | ICD-10-CM | POA: Diagnosis not present

## 2022-11-11 DIAGNOSIS — J984 Other disorders of lung: Secondary | ICD-10-CM | POA: Diagnosis not present

## 2022-11-11 DIAGNOSIS — N182 Chronic kidney disease, stage 2 (mild): Secondary | ICD-10-CM | POA: Diagnosis not present

## 2022-12-11 DIAGNOSIS — J984 Other disorders of lung: Secondary | ICD-10-CM | POA: Diagnosis not present

## 2022-12-11 DIAGNOSIS — S7291XB Unspecified fracture of right femur, initial encounter for open fracture type I or II: Secondary | ICD-10-CM | POA: Diagnosis not present

## 2022-12-11 DIAGNOSIS — M6281 Muscle weakness (generalized): Secondary | ICD-10-CM | POA: Diagnosis not present

## 2022-12-11 DIAGNOSIS — N182 Chronic kidney disease, stage 2 (mild): Secondary | ICD-10-CM | POA: Diagnosis not present

## 2022-12-19 ENCOUNTER — Other Ambulatory Visit: Payer: Self-pay | Admitting: Family Medicine

## 2023-01-08 ENCOUNTER — Ambulatory Visit
Admission: RE | Admit: 2023-01-08 | Discharge: 2023-01-08 | Disposition: A | Discharge status: Home / Self Care | Payer: 59 | Source: Ambulatory Visit | Attending: Adult Health | Admitting: Adult Health

## 2023-01-08 DIAGNOSIS — D0512 Intraductal carcinoma in situ of left breast: Secondary | ICD-10-CM | POA: Diagnosis not present

## 2023-02-04 ENCOUNTER — Other Ambulatory Visit: Payer: Self-pay | Admitting: Family Medicine

## 2023-02-06 ENCOUNTER — Encounter: Payer: Self-pay | Admitting: Family Medicine

## 2023-02-06 ENCOUNTER — Telehealth: Payer: 59 | Admitting: Family Medicine

## 2023-02-06 DIAGNOSIS — G4709 Other insomnia: Secondary | ICD-10-CM | POA: Diagnosis not present

## 2023-02-06 DIAGNOSIS — Z6841 Body Mass Index (BMI) 40.0 and over, adult: Secondary | ICD-10-CM

## 2023-02-06 DIAGNOSIS — I10 Essential (primary) hypertension: Secondary | ICD-10-CM

## 2023-02-06 DIAGNOSIS — I77810 Thoracic aortic ectasia: Secondary | ICD-10-CM | POA: Diagnosis not present

## 2023-02-06 DIAGNOSIS — I509 Heart failure, unspecified: Secondary | ICD-10-CM | POA: Diagnosis not present

## 2023-02-06 DIAGNOSIS — G4700 Insomnia, unspecified: Secondary | ICD-10-CM | POA: Insufficient documentation

## 2023-02-06 DIAGNOSIS — F331 Major depressive disorder, recurrent, moderate: Secondary | ICD-10-CM | POA: Diagnosis not present

## 2023-02-06 DIAGNOSIS — R234 Changes in skin texture: Secondary | ICD-10-CM

## 2023-02-06 DIAGNOSIS — E785 Hyperlipidemia, unspecified: Secondary | ICD-10-CM

## 2023-02-06 MED ORDER — ROSUVASTATIN CALCIUM 10 MG PO TABS: 10.0000 mg | ORAL_TABLET | Freq: Every day | ORAL | 1 refills | Status: DC

## 2023-02-06 MED ORDER — MELATONIN 5 MG PO CHEW: 5.0000 mg | CHEWABLE_TABLET | Freq: Every evening | ORAL | 0 refills | Status: AC | PRN

## 2023-02-06 NOTE — Assessment & Plan Note (Signed)
Hx of Depression vs Age related Discussed trial of Melatonin 5 mg at bedtime prn F/U soon if there is no improvement She agreed with the plan

## 2023-02-06 NOTE — Assessment & Plan Note (Signed)
No acute change from her baseline Continue current regimen

## 2023-02-06 NOTE — Assessment & Plan Note (Signed)
Found on Xray Neg ECHO Need repeat ECHO to monitor Will discuss at her next in-person visit

## 2023-02-06 NOTE — Assessment & Plan Note (Signed)
No recent BP on file Reassess at next visit

## 2023-02-06 NOTE — Progress Notes (Signed)
[facility] Telemedicine Visit  Patient consented to have virtual visit and was identified by name and date of birth. Method of visit: Telephone  Encounter participants: Patient: Andrea [name] - located at Home Provider: Janit Pagan - located at [facility] Office Others (if applicable): N/A  Chief Complaint: F/U  HPI:  Obesity: Uses hospital bed x 6 years. She has difficulty using the regular bed with her weight. She also had a recent skin desquamation from radiation therapy for her breast cancer. She requested a prescription for her mattress.  CHF/Thoracic Aortic Ectasia/HTN: Compliant with her Coreg 6.25 mg QD and Amlodipine 5 mg QD. No cardiovascular symptoms.   HLD: Off Crestor since Oct 2024. She thought she was off the meds.   MDD/Insomnia: Compliant with her Zoloft 100 mg QD. She has issues with sleep x 3 months. She goes to sleep at 8 or 9 pm, wakes up around 1 or 2 pm, and does not sleep again until around 5 am. She has not tried anything for this.    ROS: per HPI  Pertinent PMHx: PMHx reviewed  Exam:  There were no vitals taken for this visit.  Respiratory: She could complete full sentences  Assessment/Plan:  BMI 45.0-49.9, adult (HCC) Unable to assess over the telephone Request hospital bed mattress Continue lifestyle modification for weight loss Monitor for now  Hyperlipidemia Refilled Crestor  Major depressive disorder, recurrent episode (HCC) May be contributory to her insomnia Continue current dose of Sertraline   Insomnia Hx of Depression vs Age related Discussed trial of Melatonin 5 mg at bedtime prn F/U soon if there is no improvement She agreed with the plan  Congestive heart failure (HCC) No acute change from her baseline Continue current regimen  HYPERTENSION, BENIGN SYSTEMIC No recent BP on file Reassess at next visit  Thoracic aortic ectasia (HCC) Found on Xray Neg ECHO Need repeat ECHO to monitor Will  discuss at her next in-person visit     Time spent during visit with patient: 20 minutes

## 2023-02-06 NOTE — Assessment & Plan Note (Signed)
May be contributory to her insomnia Continue current dose of Sertraline

## 2023-02-06 NOTE — Assessment & Plan Note (Signed)
Refilled Crestor. 

## 2023-02-06 NOTE — Assessment & Plan Note (Signed)
Unable to assess over the telephone Request hospital bed mattress Continue lifestyle modification for weight loss Monitor for now

## 2023-02-06 NOTE — Patient Instructions (Signed)
Melatonin Capsules or Tablets What is this medication? MELATONIN (mel uh TOH nin) is promoted for sleep disorders, such as insomnia or jet lag. Melatonin helps regulate your sleep cycle. This supplement is not intended to diagnose, treat, cure, or prevent any disease. This medicine may be used for other purposes; ask your health care provider or pharmacist if you have questions. What should I tell my care team before I take this medication? They need to know if you have any of these conditions: Cancer Depression or mental health condition Diabetes Frequently drink alcohol Hormone problems Immune system problems Liver disease Lung or breathing disease, such as asthma Organ transplant Seizure disorder An unusual or allergic reaction to melatonin, other medications, foods, dyes, or preservatives Pregnant or trying to get pregnant Breastfeeding How should I use this medication? Take this medication by mouth. Do not take with food. This medication is usually taken 1 or 2 hours before bedtime. After taking this medication, limit your activities to those needed to prepare for bed. Some products may be chewed or dissolved in the mouth before swallowing. Some tablets or capsules must be swallowed whole; do not cut, crush, or chew. Follow the directions on the package labeling, or take as directed by your care team. Do not take it more often than directed. Talk to your care team the use of this medication in children. Special care may be needed. This medication is not recommended for use in children without a prescription. Overdosage: If you think you have taken too much of this medicine contact a poison control center or emergency room at once. NOTE: This medicine is only for you. Do not share this medicine with others. What if I miss a dose? If you miss taking your dose at the usual time, skip that dose. If it is almost time for your next dose, take only that dose. Do not take double or extra  doses. What may interact with this medication? Do not take this medication with any of the following: Fluvoxamine Ramelteon Tasimelteon This medication may also interact with the following: Alcohol Caffeine Carbamazepine Certain antibiotics, such as ciprofloxacin Certain medications for depression, anxiety, or mental health conditions Cimetidine Estrogen or progestin hormones Methoxsalen Nifedipine Other herbal or dietary supplements Other medications that help you fall asleep Phenobarbital Rifampin Smoking tobacco Tamoxifen Warfarin This list may not describe all possible interactions. Give your health care provider a list of all the medicines, herbs, non-prescription drugs, or dietary supplements you use. Also tell them if you smoke, drink alcohol, or use illegal drugs. Some items may interact with your medicine. What should I watch for while using this medication? See your care team if your symptoms do not get better or if they get worse. Do not take this medication for more than 2 weeks unless your care team tells you to. This medication may affect your coordination, reaction time, or judgment. Do not drive or operate machinery until you know how this medication affects you. Sit up or stand slowly to reduce the risk of dizzy or fainting spells. Drinking alcohol with this medication can increase the risk of these side effects. You may do unusual sleep behaviors or activities you do not remember the day after taking this medication. Activities include driving, making or eating food, talking on the phone, sexual activity, or sleep walking. Stop taking this medication and call your care team right away if you find out you have done activities like this. Talk to your care team before you use this  medication if you are currently being treated for an emotional, mental, or sleep problem. This medication may interfere with your treatment. Herbal or dietary supplements are not regulated like  medications. Rigid quality control standards are not required for dietary supplements. The purity and strength of these products can vary. The safety and effect of this dietary supplement for a certain disease or illness is not well known. This product is not intended to diagnose, treat, cure or prevent any disease. The Food and Drug Administration suggests the following to help consumers protect themselves: Always read product labels and follow directions. Natural does not mean a product is safe for humans to take. Look for products that include USP after the ingredient name. This means that the manufacturer followed the standards of the [place] Pharmacopoeia. Supplements made or sold by a nationally known food or drug company are more likely to be made under tight controls. You can write to the company for more information about how the product was made. What side effects may I notice from receiving this medication? Side effects that you should report to your care team as soon as possible: Allergic reactions--skin rash, itching, hives, swelling of the face, lips, tongue, or throat Mood and behavior changes--anxiety, nervousness, confusion, hallucinations, irritability, hostility, thoughts of suicide or self-harm, worsening mood, feelings of depression Side effects that usually do not require medical attention (report to your care team if they continue or are bothersome): Bedwetting in children Dizziness Drowsiness the day after use Headache Nausea This list may not describe all possible side effects. Call your doctor for medical advice about side effects. You may report side effects to FDA at 1-800-FDA-1088. Where should I keep my medication? Keep out of the reach of children. Store at room temperature or as directed on the package label. Protect from moisture. Throw away any unused medication after the expiration date. NOTE: This sheet is a summary. It may not cover all possible information. If you  have questions about this medicine, talk to your doctor, pharmacist, or health care provider.  2024 Elsevier/Gold Standard (2021-08-23 00:00:00)

## 2023-02-12 ENCOUNTER — Telehealth: Payer: Self-pay

## 2023-02-12 NOTE — Telephone Encounter (Signed)
Community message sent to [employer] for hospital bed mattress.   Will await response.   Veronda Prude, RN

## 2023-02-21 ENCOUNTER — Other Ambulatory Visit: Payer: Self-pay | Admitting: Family Medicine

## 2023-03-29 ENCOUNTER — Inpatient Hospital Stay: Payer: 59 | Attending: Hematology and Oncology | Admitting: Hematology and Oncology

## 2023-03-29 VITALS — BP 159/88 | HR 82 | Temp 98.1°F | Resp 16 | Wt 272.4 lb

## 2023-03-29 DIAGNOSIS — Z923 Personal history of irradiation: Secondary | ICD-10-CM | POA: Insufficient documentation

## 2023-03-29 DIAGNOSIS — Z79811 Long term (current) use of aromatase inhibitors: Secondary | ICD-10-CM | POA: Insufficient documentation

## 2023-03-29 DIAGNOSIS — D0512 Intraductal carcinoma in situ of left breast: Secondary | ICD-10-CM | POA: Diagnosis not present

## 2023-03-29 DIAGNOSIS — M8580 Other specified disorders of bone density and structure, unspecified site: Secondary | ICD-10-CM

## 2023-03-29 NOTE — Progress Notes (Signed)
BRIEF ONCOLOGIC HISTORY:  Oncology History  DCIS (ductal carcinoma in situ) of breast  01/04/2022 Mammogram   Mammogram showed possible mass with associated calcifications in the left breast warranting further investigation.  Left breast diagnostic mammogram confirmed abnormality.  Ultrasound showed no sonographic correlate to the left breast mass.  No axillary adenopathy   01/24/2022 Pathology Results   Left breast needle core biopsy showed atypical ductal hyperplasia involving a papillary lesion.   03/16/2022 Definitive Surgery   Patient underwent left breast lumpectomy which showed a small focus of DCIS, intermediate grade, 1.5 mm, previous procedure related changes including focal squamous metaplasia, resection margins negative for DCIS.  Prognostic show ER +95% strong staining PR +95% strong staining    Genetic Testing   No pathogenic variants identified on the [facility] Multi-Cancer Panel. 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 The report date is 06/18/2022.  The Multi-Caner Panel offered by [facility] includes sequencing and/or deletion/duplication analysis of the following 70 genes:  AIP*, ALK, APC*, ATM*, AXIN2*, BAP1*, BARD1*, BLM*, BMPR1A*, BRCA1*, BRCA2*, BRIP1*, CDC73*, CDH1*, CDK4, CDKN1B*, CDKN2A, CHEK2*, CTNNA1*, DICER1*, EPCAM, EGFR, FH*, FLCN*, GREM1, HOXB13, KIT, LZTR1, MAX*, MBD4, MEN1*, MET, MITF, MLH1*, MSH2*, MSH3*, MSH6*, MUTYH*, NF1*, NF2*, NTHL1*, PALB2*, PDGFRA, PMS2*, POLD1*, POLE*, POT1*, PRKAR1A*, PTCH1*, PTEN*, RAD51C*, RAD51D*, RB1*, RET, SDHA*, SDHAF2*, SDHB*, SDHC*, SDHD*, SMAD4*, SMARCA4*, SMARCB1*, SMARCE1*, STK11*, SUFU*, TMEM127*, TP53*, TSC1*, TSC2*, VHL*. RNA analysis was not completed as the sample did not meet quality metrics.   05/11/2022 - 06/07/2022 Radiation Therapy     The patient initially received a dose of 42.56 Gy in 16 fractions to the left breast using whole-breast tangent fields. This was delivered using a 3-D conformal technique. The patient  then received a boost to the seroma. This delivered an additional 8 Gy in 32fractions using a 3 field photon technique due to the depth of the seroma. The total dose was 50.56 Gy.    09/25/2022 Cancer Staging   Staging form: Breast, AJCC 8th Edition - Pathologic: Stage 0 (pTis (DCIS), pN0, cM0, ER+, PR+) - Signed by Loa Socks, NP on 09/25/2022 Stage prefix: Initial diagnosis     INTERVAL HISTORY:   Discussed the use of AI scribe software for clinical note transcription with the patient, who gave verbal consent to proceed.  History of Present Illness    The patient, with breast cancer and osteopenia, presents for follow-up regarding treatment and management.  She is currently taking anastrozole for breast cancer and experiences occasional sharp, brief pains in the breast. A mammogram conducted in November was normal.  She was diagnosed with osteopenia following a bone density scan last year and takes vitamin D and calcium supplements. Weather conditions have hindered her ability to maintain an active lifestyle, which is recommended for her condition. She sometimes experiences pain during movement.  She reports frequent urination, which she attributes to high soda consumption. She has reduced her intake from three to four sodas a day to a lesser amount and does not drink much water. No trouble with urination other than frequency.  Rest of the pertinent 10 point ROS reviewed and negative PAST MEDICAL/SURGICAL HISTORY:  Past Medical History:  Diagnosis Date   Abnormal mammogram 08/28/2014   08/28/14 - Probable benign microcalcifications over the outer mid to lower left breast. Bi-Rads 3. F/U 6 months    Allergic rhinitis 06/22/2014   Arm pain, lateral, left 04/28/2016   Asthma    Breast cancer (HCC) 03/16/2022   Carpal tunnel syndrome 04/26/2006  Qualifier: Diagnosis of  By: Irving Burton MD, Aaron     Femur fracture [facility]) 05/14/2021   Femur fracture, right (HCC) 05/14/2021    First degree AV block 09/22/2013   EKG 09/22/13    GANGLION CYST, WRIST, RIGHT 10/06/2008   Qualifier: Diagnosis of  By: Lanier Prude  MD, Taineisha     Hypothyroidism    Imbalance 01/12/2017   Palpitations 09/22/2013   Right wrist pain 04/28/2016   Past Surgical History:  Procedure Laterality Date   ABDOMINAL HYSTERECTOMY     fibroids   BREAST BIOPSY Left 01/24/2022   MM LT BREAST BX W LOC DEV 1ST LESION IMAGE BX SPEC STEREO GUIDE 01/24/2022 [facility]   BREAST BIOPSY  03/15/2022   MM LT RADIOACTIVE SEED LOC MAMMO GUIDE 03/15/2022 [facility]AMMO[facility]   BREAST LUMPECTOMY WITH RADIOACTIVE SEED LOCALIZATION Left 03/16/2022   Procedure: LEFT BREAST LUMPECTOMY WITH RADIOACTIVE SEED LOCALIZATION;  Surgeon: Harriette Bouillon, MD;  Location: [facility];  Service: General;  Laterality: Left;   FEMUR IM NAIL Right 05/14/2021   Procedure: INTRAMEDULLARY (IM) RETROGRADE FEMORAL NAILING;  Surgeon: Estill Bamberg, MD;  Location: [facility];  Service: Orthopedics;  Laterality: Right;     ALLERGIES:  No Known Allergies   CURRENT MEDICATIONS:  Outpatient Encounter Medications as of 03/29/2023  Medication Sig Note   albuterol (VENTOLIN HFA) 108 (90 Base) MCG/ACT inhaler Inhale 2 puffs into the lungs every 6 (six) hours as needed for wheezing or shortness of breath. (Patient not taking: Reported on 02/06/2023)    amLODipine (NORVASC) 5 MG tablet TAKE 1 TABLET BY MOUTH AT BEDTIME    anastrozole (ARIMIDEX) 1 MG tablet Take 1 tablet (1 mg total) by mouth daily.    carvedilol (COREG) 6.25 MG tablet TAKE 1 TABLET BY MOUTH TWICE DAILY WITH A MEAL    famotidine (PEPCID) 20 MG tablet TAKE 1 TABLET BY MOUTH ONCE DAILY AS NEEDED FOR  HEARTBURN  OR  INDIGESTION    ferrous sulfate 325 (65 FE) MG tablet Take 1 tablet (325 mg total) by mouth daily with breakfast. (Patient not taking: Reported on 02/06/2023) 03/09/2022: Not taking    fluticasone-salmeterol (ADVAIR HFA) 115-21 MCG/ACT inhaler Inhale 2 puffs  into the lungs 2 (two) times daily. (Patient not taking: Reported on 02/06/2023)    levothyroxine (SYNTHROID) 125 MCG tablet TAKE 1 TABLET BY MOUTH ONCE DAILY BEFORE BREAKFAST    Melatonin 5 MG CHEW Chew 5 mg by mouth at bedtime as needed.    Omega-3 Fatty Acids (FISH OIL) 1000 MG CAPS Take 1 capsule (1,000 mg total) by mouth in the morning and at bedtime. (Patient taking differently: Take 1,000 mg by mouth daily.)    polyethylene glycol (MIRALAX / GLYCOLAX) 17 g packet Take 17 g by mouth 2 (two) times daily. (Patient not taking: Reported on 02/06/2023)    rosuvastatin (CRESTOR) 10 MG tablet Take 1 tablet (10 mg total) by mouth daily.    sertraline (ZOLOFT) 50 MG tablet Take 2 tablets by mouth once daily    Vitamin D, Cholecalciferol, 50 MCG (2000 UT) CAPS Take 2,000 Units by mouth daily.    No facility-administered encounter medications on file as of 03/29/2023.     ONCOLOGIC FAMILY HISTORY:  Family History  Problem Relation Age of Onset   Brain cancer Mother        cervial, pituitary, bone?   Hypertension Mother    Lung cancer Father    Alcohol abuse Father    Hypertension Sister    Hypothyroidism  Sister    Hypertension Sister    Hypothyroidism Sister    Hypertension Sister    Hypothyroidism Sister    Hypertension Sister    Hypothyroidism Sister    Hypertension Sister    Hypothyroidism Sister    Prostate cancer Brother    Lung cancer Brother        lung   Ovarian cancer Maternal Aunt      SOCIAL HISTORY:  Social History   Socioeconomic History   Marital status: Single    Spouse name: Not on file   Number of children: Not on file   Years of education: Not on file   Highest education level: Not on file  Occupational History   Not on file  Tobacco Use   Smoking status: Never   Smokeless tobacco: Never   Tobacco comments:    Tobacco smoke comes in through the house vent from outside  Vaping Use   Vaping status: Never Used  Substance and Sexual Activity   Alcohol  use: No   Drug use: No   Sexual activity: Never    Birth control/protection: Surgical  Other Topics Concern   Not on file  Social History Narrative   Works for [employer], 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[employer], concessions   Social Drivers of Health   Financial Resource Strain: Medium Risk (01/06/2019)   Overall Financial Resource Strain (CARDIA)    Difficulty of Paying Living Expenses: Somewhat hard  Food Insecurity: Food Insecurity Present (04/27/2022)   Hunger Vital Sign    Worried About Running Out of Food in the Last Year: Sometimes true    Ran Out of Food in the Last Year: Sometimes true  Transportation Needs: Unmet Transportation Needs (04/27/2022)   PRAPARE - Administrator, Civil Service (Medical): Yes    Lack of Transportation (Non-Medical): No  Physical Activity: Inactive (03/31/2022)   Exercise Vital Sign    Days of Exercise per Week: 0 days    Minutes of Exercise per Session: 0 min  Stress: Not on file  Social Connections: Not on file  Intimate Partner Violence: Not on file     OBSERVATIONS/OBJECTIVE:  BP (!) 159/88 (BP Location: Left Wrist, Patient Position: Sitting)   Pulse 82   Temp 98.1 F (36.7 C) (Temporal)   Resp 16   Wt 272 lb 6.4 oz (123.6 kg)   SpO2 90%   BMI 48.25 kg/m  GENERAL: Patient is a well appearing female in no acute distress HEENT:  Sclerae anicteric.  Oropharynx clear and moist. No ulcerations or evidence of oropharyngeal candidiasis. Neck is supple.  NODES:  No cervical, supraclavicular, or axillary lymphadenopathy palpated.  BREAST EXAM: Left breast status postlumpectomy and radiation, radiation changes noted, there is moist desquamation that is healing well underneath her left breast, no sign of infection noted.  LUNGS:  Clear to auscultation bilaterally.  No wheezes or rhonchi. HEART:  Regular rate and rhythm. No murmur appreciated. ABDOMEN:  Soft, nontender.  Positive, normoactive bowel sounds. No  organomegaly palpated. MSK:  No focal spinal tenderness to palpation. Full range of motion bilaterally in the upper extremities. EXTREMITIES:  No peripheral edema.   SKIN:  Clear with no obvious rashes or skin changes. No nail dyscrasia. NEURO:  Nonfocal. Well oriented.  Appropriate affect.   LABORATORY DATA:  None for this visit.  DIAGNOSTIC IMAGING:  None for this visit.      ASSESSMENT AND PLAN:  Ms.. Joa is a pleasant 68 y.o.  female with Stage 0 left breast DCIS, ER+/PR+, diagnosed in 02/2022, treated with lumpectomy, adjuvant radiation therapy, and anti-estrogen therapy with Anastrozole beginning in 06/2022.    Breast Cancer On Anastrozole with intermittent sharp breast pain, which is normal. Mammogram in November was normal. -Bilateral breast inspected and palpated today.  No concern for local recurrence.  No regional adenopathy.  Posttreatment changes noted in the left breast. -Continue Anastrozole. -Continue regular mammograms.  She is due for another mammogram in November 2025.  Osteopenia Bone density scan last year showed osteopenia. Patient is taking Vitamin D and Calcium. -Encourage regular walking and weight-bearing exercises. -Plan for repeat bone density scan later this year.  Diet and Hydration Patient reports high soda intake leading to frequent urination. -Advise to gradually reduce soda intake and increase water consumption.  Follow-up in 6 months.  *Total Encounter Time as defined by the Centers for Medicare and Medicaid Services includes, in addition to the face-to-face time of a patient visit (documented in the note above) non-face-to-face time: obtaining and reviewing outside history, ordering and reviewing medications, tests or procedures, care coordination (communications with other health care professionals or caregivers) and documentation in the medical record.

## 2023-03-30 ENCOUNTER — Telehealth: Payer: Self-pay | Admitting: Hematology and Oncology

## 2023-03-30 NOTE — Telephone Encounter (Signed)
 Spoke with patient confirming upcoming appointment

## 2023-04-03 ENCOUNTER — Encounter: Payer: 59 | Admitting: Family Medicine

## 2023-04-17 ENCOUNTER — Ambulatory Visit (INDEPENDENT_AMBULATORY_CARE_PROVIDER_SITE_OTHER): Payer: 59 | Admitting: Family Medicine

## 2023-04-17 ENCOUNTER — Encounter: Payer: Self-pay | Admitting: Family Medicine

## 2023-04-17 VITALS — BP 143/106 | HR 69 | Ht 63.0 in | Wt 271.4 lb

## 2023-04-17 DIAGNOSIS — Z1211 Encounter for screening for malignant neoplasm of colon: Secondary | ICD-10-CM | POA: Diagnosis not present

## 2023-04-17 DIAGNOSIS — E039 Hypothyroidism, unspecified: Secondary | ICD-10-CM

## 2023-04-17 DIAGNOSIS — I10 Essential (primary) hypertension: Secondary | ICD-10-CM | POA: Diagnosis not present

## 2023-04-17 DIAGNOSIS — M25552 Pain in left hip: Secondary | ICD-10-CM | POA: Diagnosis not present

## 2023-04-17 DIAGNOSIS — Z23 Encounter for immunization: Secondary | ICD-10-CM | POA: Diagnosis not present

## 2023-04-17 DIAGNOSIS — E2839 Other primary ovarian failure: Secondary | ICD-10-CM

## 2023-04-17 DIAGNOSIS — M25559 Pain in unspecified hip: Secondary | ICD-10-CM | POA: Insufficient documentation

## 2023-04-17 DIAGNOSIS — E785 Hyperlipidemia, unspecified: Secondary | ICD-10-CM

## 2023-04-17 NOTE — Assessment & Plan Note (Signed)
FLP checked. 

## 2023-04-17 NOTE — Assessment & Plan Note (Addendum)
 Uncontrolled BP Unclear her home BP meds at the time of the visit I called her after visit and verified that she is taking her Norvasc 5 mg every day and Coreg 6.25 mg BID Plan to go up on Norvasc to 10 mg every day However, I am weary of doing this over the phone for her F/U appointment made, she will come in with her medication bottles for med adjustment Monitor BP closely for now She agreed with the plan Cmet checked

## 2023-04-17 NOTE — Assessment & Plan Note (Signed)
 TSH checked Will adjust meds as needed based on test result

## 2023-04-17 NOTE — Patient Instructions (Signed)
Hip Exercises Ask your health care provider which exercises are safe for you. Do exercises exactly as told by your provider and adjust them as told. It is normal to feel mild stretching, pulling, tightness, or discomfort as you do these exercises. Stop right away if you feel sudden pain or your pain gets worse. Do not begin these exercises until told by your provider. Stretching and range-of-motion exercises These exercises warm up your muscles and joints and improve the movement and flexibility of your hip. They also help to relieve pain, numbness, and tingling. You may be asked to limit your range of motion if you had a hip replacement. Talk to your provider about these limits. Hamstrings, supine  Lie on your back (supine position). Loop a belt, towel, or exercise band over the ball of your left / right foot. The ball of your foot is on the walking surface, right under your toes. Straighten your left / right knee and slowly pull on the belt, towel, or band to raise your leg until you feel a gentle stretch behind your knee (hamstring). Do not let your knee bend while you do this. Keep your other leg flat on the floor. Hold this position for __________ seconds. Slowly return your leg to the starting position. Repeat __________ times. Complete this exercise __________ times a day. Hip rotation  Lie on your back on a firm surface. With your left / right hand, gently pull your left / right knee toward the shoulder that is on the same side of the body. Stop when your knee is pointing toward the ceiling. Hold your left / right ankle with your other hand. Keeping your knee steady, gently pull your left / right ankle toward your other shoulder until you feel a stretch in your butt. Keep your hips and shoulders firmly planted while you do this stretch. Hold this position for __________ seconds. Repeat __________ times. Complete this exercise __________ times a day. Seated stretch This exercise is  sometimes called hamstrings and adductors stretch. Sit on the floor with your legs stretched wide. Keep your knees straight during this exercise. Keeping your head and back in a straight line, bend at your waist to reach for your left foot (position A). You should feel a stretch in your right inner thigh (adductors). Hold this position for __________ seconds. Then slowly return to the upright position. Keeping your head and back in a straight line, bend at your waist to reach forward (position B). You should feel a stretch behind both of your thighs and knees (hamstrings). Hold this position for __________ seconds. Then slowly return to the upright position. Keeping your head and back in a straight line, bend at your waist to reach for your right foot (position C). You should feel a stretch in your left inner thigh (adductors). Hold this position for __________ seconds. Then slowly return to the upright position. Repeat __________ times. Complete this exercise __________ times a day. Lunge This exercise stretches the muscles of the hip (hip flexors). Place your left / right knee on the floor and bend your other knee so that is directly over your ankle. You should be half-kneeling. Keep good posture with your head over your shoulders. Tighten your butt muscles to point your tailbone downward. This will prevent your back from arching too much. You should feel a gentle stretch in the front of your left / right thigh and hip. If you do not feel a stretch, slide your other foot forward slightly and  then slowly lunge forward with your chest up until your knee once again lines up over your ankle. Make sure your tailbone continues to point downward. Hold this position for __________ seconds. Slowly return to the starting position. Repeat __________ times. Complete this exercise __________ times a day. Strengthening exercises These exercises build strength and endurance in your hip. Endurance is the  ability to use your muscles for a long time, even after they get tired. Bridge This exercise strengthens the muscles of your hip (hip extensors). Lie on your back on a firm surface with your knees bent and your feet flat on the floor. Tighten your butt muscles and lift your bottom off the floor until the trunk of your body and your hips are level with your thighs. Do not arch your back. You should feel the muscles working in your butt and the back of your thighs. If you do not feel these muscles, slide your feet 1-2 inches (2.5-5 cm) farther away from your butt. Hold this position for __________ seconds. Slowly lower your hips to the starting position. Let your muscles relax completely between repetitions. Repeat __________ times. Complete this exercise __________ times a day. Straight leg raises, side-lying This exercise strengthens the muscles that move the hip joint away from the center of the body (hip abductors). Lie on your side with your left / right leg in the top position. Lie so your head, shoulder, hip, and knee line up. You may bend your bottom knee slightly to help you balance. Roll your hips slightly forward, so your hips are stacked directly over each other and your left / right knee is facing forward. Leading with your heel, lift your top leg 4-6 inches (10-15 cm). You should feel the muscles in your top hip lifting. Do not let your foot drift forward. Do not let your knee roll toward the ceiling. Hold this position for __________ seconds. Slowly return to the starting position. Let your muscles relax completely between repetitions. Repeat __________ times. Complete this exercise __________ times a day. Straight leg raises, side-lying This exercise strengthens the muscles that move the hip joint toward the center of the body (hip adductors). Lie on your side with your left / right leg in the bottom position. Lie so your head, shoulder, hip, and knee line up. You may place your  upper foot in front to help you balance. Roll your hips slightly forward, so your hips are stacked directly over each other and your left / right knee is facing forward. Tense the muscles in your inner thigh and lift your bottom leg 4-6 inches (10-15 cm). Hold this position for __________ seconds. Slowly return to the starting position. Let your muscles relax completely between repetitions. Repeat __________ times. Complete this exercise __________ times a day. Straight leg raises, supine This exercise strengthens the muscles in the front of your thigh (quadriceps and hip flexors). Lie on your back (supine position) with your left / right leg extended and your other knee bent. Tense the muscles in the front of your left / right thigh. You should see your kneecap slide up or see increased dimpling just above your knee. Keep these muscles tight as you raise your leg 4-6 inches (10-15 cm) off the floor. Do not let your knee bend. Hold this position for __________ seconds. Keep these muscles tense as you lower your leg. Relax the muscles slowly and completely between repetitions. Repeat __________ times. Complete this exercise __________ times a day. Hip abductors, standing This  exercise strengthens the muscles that move the leg and hip joint away from the center of the body (hip abductors). Tie one end of a rubber exercise band or tubing to a secure surface, such as a chair, table, or pole. Loop the other end of the band or tubing around your left / right ankle. Keeping your ankle with the band or tubing directly opposite the secured end, step away until there is tension in the tubing or band. Hold on to a chair, table, or pole as needed for balance. Lift your left / right leg out to your side. While you do this: Keep your back upright. Keep your shoulders over your hips. Keep your toes pointing forward. Make sure to use your hip muscles to slowly lift your leg. Do not tip your body or  forcefully lift your leg. Hold this position for __________ seconds. Slowly return to the starting position. Repeat __________ times. Complete this exercise __________ times a day. Squats This exercise strengthens the muscles in the front of your thigh (quadriceps). Stand in front of a table, or stand in a doorframe so your feet and knees are in line with the frame. You may place your hands on the table or frame for balance. Slowly bend your knees and lower your hips like you are going to sit in a chair. Keep your lower legs in a straight up-and-down position. Do not let your hips go lower than your knees. Do not bend your knees lower than told by your provider. If your hip pain increases, do not bend as low. Hold this position for ___________ seconds. Slowly push with your legs to return to standing. Do not use your hands to pull yourself to standing. Repeat __________ times. Complete this exercise __________ times a day. This information is not intended to replace advice given to you by your health care provider. Make sure you discuss any questions you have with your health care provider. Document Revised: 10/18/2021 Document Reviewed: 10/18/2021 Elsevier Patient Education  2024 [employer].

## 2023-04-17 NOTE — Progress Notes (Signed)
    SUBJECTIVE:   CHIEF COMPLAINT / HPI:   Hip Pain  Incident onset: Started a few months ago. There was no injury mechanism. The pain is present in the left hip. Quality: Sharp pain. The pain is at a severity of 0/10 (Sometimes pain is very intense. SHe currently does not have any pain). The pain is moderate. The pain has been Intermittent since onset. Associated symptoms include an inability to bear weight. Associated symptoms comments: Intermittent leg numbness and weakness only when she walks and put pressure on her leg. She is currently asymptomatic. Denies back pain. The symptoms are aggravated by movement and weight bearing. Treatments tried: Baby Aspirin. The treatment provided mild relief.   HTN/HLD:  She did not come in with her med bottles and does not know what she is taking. Does not check BP at home regularly. No questions.   Hypothyroidism: Compliant with meds.   PERTINENT  PMH / PSH: PMHx reviewed  OBJECTIVE:   BP (!) 143/106   Pulse 69   Ht 5\' 3"  (1.6 m)   Wt 271 lb 6.4 oz (123.1 kg)   SpO2 98%   BMI 48.08 kg/m   Physical Exam Vitals and nursing note reviewed.  Cardiovascular:     Rate and Rhythm: Normal rate and regular rhythm.     Heart sounds: Normal heart sounds. No murmur heard. Pulmonary:     Effort: Pulmonary effort is normal. No respiratory distress.     Breath sounds: Normal breath sounds. No wheezing or rales.  Abdominal:     General: Abdomen is flat. Bowel sounds are normal. There is no distension.     Palpations: Abdomen is soft. There is no mass.     Tenderness: There is no abdominal tenderness.  Musculoskeletal:     Right hip: Normal. Normal range of motion.     Left hip: Normal. Normal range of motion.     Comments: Mild tenderness over his lateral thigh musculature      ASSESSMENT/PLAN:   HYPERTENSION, BENIGN SYSTEMIC Uncontrolled BP Unclear her home BP meds at the time of the visit I called her after visit and verified that she is  taking her Norvasc 5 mg every day and Coreg 6.25 mg BID Plan to go up on Norvasc to 10 mg every day However, I am weary of doing this over the phone for her F/U appointment made, she will come in with her medication bottles for med adjustment Monitor BP closely for now She agreed with the plan Cmet checked  Hyperlipidemia FLP checked  Hypothyroidism TSH checked Will adjust meds as needed based on test result  Hip pain 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   Janit Pagan, MD [facility]

## 2023-04-17 NOTE — Assessment & Plan Note (Signed)
 Likely DJD Xray ordered May use Tylenol as needed for pain Consider PT/OT referral depending on xray report F/U soon if symptoms worsens She agreed with the plan

## 2023-04-18 ENCOUNTER — Telehealth: Payer: Self-pay | Admitting: Family Medicine

## 2023-04-18 LAB — CMP14+EGFR
ALT: 55 IU/L — ABNORMAL HIGH (ref 0–32)
AST: 95 IU/L — ABNORMAL HIGH (ref 0–40)
Albumin: 3.8 g/dL — ABNORMAL LOW (ref 3.9–4.9)
Alkaline Phosphatase: 156 IU/L — ABNORMAL HIGH (ref 44–121)
BUN/Creatinine Ratio: 12 (ref 12–28)
BUN: 12 mg/dL (ref 8–27)
Bilirubin Total: 0.5 mg/dL (ref 0.0–1.2)
CO2: 27 mmol/L (ref 20–29)
Calcium: 9.2 mg/dL (ref 8.7–10.3)
Chloride: 97 mmol/L (ref 96–106)
Creatinine, Ser: 1.04 mg/dL — ABNORMAL HIGH (ref 0.57–1.00)
Globulin, Total: 3.5 g/dL (ref 1.5–4.5)
Glucose: 98 mg/dL (ref 70–99)
Potassium: 3.7 mmol/L (ref 3.5–5.2)
Sodium: 140 mmol/L (ref 134–144)
Total Protein: 7.3 g/dL (ref 6.0–8.5)
eGFR: 59 mL/min/1.73 — ABNORMAL LOW

## 2023-04-18 LAB — LIPID PANEL
Chol/HDL Ratio: 4.6 ratio — ABNORMAL HIGH (ref 0.0–4.4)
Cholesterol, Total: 165 mg/dL (ref 100–199)
HDL: 36 mg/dL — ABNORMAL LOW
LDL Chol Calc (NIH): 104 mg/dL — ABNORMAL HIGH (ref 0–99)
Triglycerides: 138 mg/dL (ref 0–149)
VLDL Cholesterol Cal: 25 mg/dL (ref 5–40)

## 2023-04-18 LAB — TSH: TSH: 6.52 u[IU]/mL — ABNORMAL HIGH (ref 0.450–4.500)

## 2023-04-18 NOTE — Telephone Encounter (Signed)
 HIPAA compliant callback message left.  Please advise: Her kidney function is mildly elevated. This might be due to poor hydration. Encourage good oral hydration. Recheck in the future.  Liver function elevated. Multifactorial, including medication induced. I would like to see you back soon to discuss the management plan, medication adjustment, and repeat lab.  The thyroid test is elevated. She confirmed compliance with her Synthroid. We can increase meds and recheck in 4 weeks. I will discuss this further when she connects with me.  The lipid test is elevated with an ASCVD risk of 9.1%. She is already on Crestor and needs to discuss compliance and diet adjustment. We will not increase the crestor for now, given her liver function.

## 2023-04-24 ENCOUNTER — Other Ambulatory Visit: Payer: Self-pay | Admitting: Family Medicine

## 2023-04-24 ENCOUNTER — Ambulatory Visit (HOSPITAL_COMMUNITY)
Admission: RE | Admit: 2023-04-24 | Discharge: 2023-04-24 | Disposition: A | Discharge status: Home / Self Care | Payer: 59 | Source: Ambulatory Visit | Attending: Family Medicine | Admitting: Family Medicine

## 2023-04-24 ENCOUNTER — Other Ambulatory Visit (HOSPITAL_COMMUNITY): Payer: Self-pay | Admitting: Family Medicine

## 2023-04-24 DIAGNOSIS — Z23 Encounter for immunization: Secondary | ICD-10-CM

## 2023-04-24 DIAGNOSIS — E785 Hyperlipidemia, unspecified: Secondary | ICD-10-CM

## 2023-04-24 DIAGNOSIS — I10 Essential (primary) hypertension: Secondary | ICD-10-CM

## 2023-04-24 DIAGNOSIS — E039 Hypothyroidism, unspecified: Secondary | ICD-10-CM

## 2023-04-24 DIAGNOSIS — M25552 Pain in left hip: Secondary | ICD-10-CM | POA: Insufficient documentation

## 2023-04-24 DIAGNOSIS — Z1211 Encounter for screening for malignant neoplasm of colon: Secondary | ICD-10-CM

## 2023-04-24 DIAGNOSIS — M25551 Pain in right hip: Secondary | ICD-10-CM | POA: Insufficient documentation

## 2023-04-24 DIAGNOSIS — E2839 Other primary ovarian failure: Secondary | ICD-10-CM

## 2023-04-27 NOTE — Telephone Encounter (Signed)
 Patient returns call to nurse line. Verified name and DOB. Informed of message per Dr. Lum Babe.   She reports compliance with cholesterol medication, however, is worried about continuing due to pain in her legs.   She also reports compliance with synthroid.   Patient does have follow up on 05/04/23 with PCP. I advised that I would send this to provider, however, she may want to discuss results and management further at upcoming appointment.   Patient voiced understanding.   Veronda Prude, RN

## 2023-05-04 ENCOUNTER — Ambulatory Visit: Payer: 59 | Admitting: Family Medicine

## 2023-05-04 ENCOUNTER — Encounter: Payer: Self-pay | Admitting: Family Medicine

## 2023-05-04 VITALS — BP 138/85 | HR 73 | Ht 63.0 in | Wt 269.2 lb

## 2023-05-04 DIAGNOSIS — N289 Disorder of kidney and ureter, unspecified: Secondary | ICD-10-CM | POA: Diagnosis not present

## 2023-05-04 DIAGNOSIS — E785 Hyperlipidemia, unspecified: Secondary | ICD-10-CM

## 2023-05-04 DIAGNOSIS — I10 Essential (primary) hypertension: Secondary | ICD-10-CM | POA: Diagnosis not present

## 2023-05-04 DIAGNOSIS — R748 Abnormal levels of other serum enzymes: Secondary | ICD-10-CM

## 2023-05-04 DIAGNOSIS — E038 Other specified hypothyroidism: Secondary | ICD-10-CM | POA: Diagnosis not present

## 2023-05-04 MED ORDER — ALBUTEROL SULFATE HFA 108 (90 BASE) MCG/ACT IN AERS: 2.0000 | INHALATION_SPRAY | Freq: Four times a day (QID) | RESPIRATORY_TRACT | 1 refills | Status: AC | PRN

## 2023-05-04 MED ORDER — AMLODIPINE BESYLATE 5 MG PO TABS: 5.0000 mg | ORAL_TABLET | Freq: Every day | ORAL | 1 refills | Status: AC

## 2023-05-04 NOTE — Assessment & Plan Note (Signed)
 Recent LDL above goal Continue current Crestor dose while working on lifestyle modification Recheck FLP in 6 months

## 2023-05-04 NOTE — Assessment & Plan Note (Signed)
 Recent TSH elevated despite compliance with treatment Appointment made to recheck lab in 3 weeks Continue current dose of Synthroid

## 2023-05-04 NOTE — Patient Instructions (Signed)
 How to Take Your Blood Pressure Blood pressure measures how strongly your blood is pressing against the walls of your arteries. Arteries are blood vessels that carry blood from your heart throughout your body. You can take your blood pressure at home with a machine. You may need to check your blood pressure at home: To check if you have high blood pressure (hypertension). To check your blood pressure over time. To make sure your blood pressure medicine is working. Supplies needed: Blood pressure machine, or monitor. A chair to sit in. This should be a chair where you can sit upright with your back supported. Do not sit on a soft couch or an armchair. Table or desk. Small notebook. Pencil or pen. How to prepare Avoid these things for 30 minutes before checking your blood pressure: Having drinks with caffeine in them, such as coffee or tea. Drinking alcohol. Eating. Smoking. Exercising. Do these things five minutes before checking your blood pressure: Go to the bathroom and pee (urinate). Sit in a chair. Be quiet. Do not talk. How to take your blood pressure Follow the instructions that came with your machine. If you have a digital blood pressure monitor, these may be the instructions: Sit up straight. Place your feet on the floor. Do not cross your ankles or legs. Rest your left arm at the level of your heart. You may rest it on a table, desk, or chair. Pull up your shirt sleeve. Wrap the blood pressure cuff around the upper part of your left arm. The cuff should be 1 inch (2.5 cm) above your elbow. It is best to wrap the cuff around bare skin. Fit the cuff snugly around your arm, but not too tightly. You should be able to place only one finger between the cuff and your arm. Place the cord so that it rests in the bend of your elbow. Press the power button. Sit quietly while the cuff fills with air and loses air. Write down the numbers on the screen. Wait 2-3 minutes and then repeat  steps 1-10. What do the numbers mean? Two numbers make up your blood pressure. The first number is called systolic pressure. The second is called diastolic pressure. An example of a blood pressure reading is "120 over 80" (or 120/80). If you are an adult and do not have a medical condition, use this guide to find out if your blood pressure is normal: Normal First number: below 120. Second number: below 80. Elevated First number: 120-129. Second number: below 80. Hypertension stage 1 First number: 130-139. Second number: 80-89. Hypertension stage 2 First number: 140 or above. Second number: 90 or above. Your blood pressure is above normal even if only the first or only the second number is above normal. Follow these instructions at home: Medicines Take over-the-counter and prescription medicines only as told by your doctor. Tell your doctor if your medicine is causing side effects. General instructions Check your blood pressure as often as your doctor tells you to. Check your blood pressure at the same time every day. Take your monitor to your next doctor's appointment. Your doctor will: Make sure you are using it correctly. Make sure it is working right. Understand what your blood pressure numbers should be. Keep all follow-up visits. General tips You will need a blood pressure machine or monitor. Your doctor can suggest a monitor. You can buy one at a drugstore or online. When choosing one: Choose one with an arm cuff. Choose one that wraps around your  upper arm. Only one finger should fit between your arm and the cuff. Do not choose one that measures your blood pressure from your wrist or finger. Where to find more information American Heart Association: www.heart.org Contact a doctor if: Your blood pressure keeps being high. Your blood pressure is suddenly low. Get help right away if: Your first blood pressure number is higher than 180. Your second blood pressure number is  higher than 120. These symptoms may be an emergency. Do not wait to see if the symptoms will go away. Get help right away. Call 911. Summary Check your blood pressure at the same time every day. Avoid caffeine, alcohol, smoking, and exercise for 30 minutes before checking your blood pressure. Make sure you understand what your blood pressure numbers should be. This information is not intended to replace advice given to you by your health care provider. Make sure you discuss any questions you have with your health care provider. Document Revised: 10/28/2020 Document Reviewed: 10/28/2020 Elsevier Patient Education  2024 [employer].

## 2023-05-04 NOTE — Progress Notes (Signed)
    SUBJECTIVE:   CHIEF COMPLAINT / HPI:   HTN/HLD: She is compliant with her Norvasc 5 mg WD and Coreg 6.25 mg BID for her BP. She is on fish oil and Crestor 10 mg QD for her HLD. Here for follow-up.  Hypothyroidism: Came with her medication bottle and she takes her Synthroid 125 mcg every day.  Elevated liver enzymes/AKI: Denies GI/GU symptoms. Here to follow up with her lab result.    PERTINENT  PMH / PSH: PMHx reviewed  OBJECTIVE:   BP 138/85   Pulse 73   Ht 5\' 3"  (1.6 m)   Wt 269 lb 4 oz (122.1 kg)   SpO2 97%   BMI 47.70 kg/m   Physical Exam Vitals and nursing note reviewed.  Cardiovascular:     Rate and Rhythm: Normal rate and regular rhythm.     Heart sounds: Normal heart sounds. No murmur heard. Pulmonary:     Effort: Pulmonary effort is normal. No respiratory distress.     Breath sounds: Normal breath sounds. No wheezing.  Abdominal:     General: Abdomen is flat. Bowel sounds are normal. There is no distension.     Palpations: Abdomen is soft. There is no mass.     Tenderness: There is no abdominal tenderness.      ASSESSMENT/PLAN:   HYPERTENSION, BENIGN SYSTEMIC BP looks great Medications confirmed today No adjustment needed  Hyperlipidemia Recent LDL above goal Continue current Crestor dose while working on lifestyle modification Recheck FLP in 6 months  Hypothyroidism Recent TSH elevated despite compliance with treatment Appointment made to recheck lab in 3 weeks Continue current dose of Synthroid  Elevated liver enzymes No GI or skin symptoms ?? Secondary to Crestor/Fish oil Plan to recheck lab in 3 weeks If still elevated, we will discuss cutting back on these medications She agreed with the plan  Acute renal impairment Likely due to dehydration from her last lab appointment Hydration encouraged F/U made to recheck.     Janit Pagan, MD [facility]

## 2023-05-04 NOTE — Assessment & Plan Note (Signed)
 Likely due to dehydration from her last lab appointment Hydration encouraged F/U made to recheck.

## 2023-05-04 NOTE — Assessment & Plan Note (Signed)
 No GI or skin symptoms ?? Secondary to Crestor/Fish oil Plan to recheck lab in 3 weeks If still elevated, we will discuss cutting back on these medications She agreed with the plan

## 2023-05-04 NOTE — Assessment & Plan Note (Signed)
 BP looks great Medications confirmed today No adjustment needed

## 2023-05-14 ENCOUNTER — Telehealth: Payer: Self-pay | Admitting: Family Medicine

## 2023-05-14 NOTE — Telephone Encounter (Signed)
 HIPAA compliant callback message left.    Advise - Hip/pelvic xray negative for arthritis or acute joint disorder.

## 2023-05-14 NOTE — Telephone Encounter (Signed)
 Patient returns call to nurse line.   Negative results discussed with patient.   Patient will FU on 4/1 as planned.

## 2023-05-23 ENCOUNTER — Telehealth: Payer: Self-pay | Admitting: Gastroenterology

## 2023-05-23 ENCOUNTER — Other Ambulatory Visit: Payer: Self-pay

## 2023-05-23 DIAGNOSIS — Z1211 Encounter for screening for malignant neoplasm of colon: Secondary | ICD-10-CM

## 2023-05-23 NOTE — Telephone Encounter (Signed)
 Pt was scheduled for colon with Dr Tomasa Rand in May 2024. Pt called in April to cancel the appt. She stated at that time she was going to call back in the summer to reschedule. Pt calling back now to reschedule hospital procedure. Is she on the wait list for Dr. Tomasa Rand?

## 2023-05-23 NOTE — Telephone Encounter (Signed)
 Patient contacted and informed of procedure date and time 08/20/23 @ 8:15am as well as pre-visit 08/07/23 @ 2pm. Patient had no questions at the end of call.

## 2023-05-23 NOTE — Telephone Encounter (Signed)
 Contacted patient and let her know that Dr.Cunningham had three dates open 07/26/23, 08/20/23/ and 08/27/23. Patient decided on 08/20/23 @ 8:15am at [facility].

## 2023-05-23 NOTE — Telephone Encounter (Signed)
 Left message for pt that she is on Dr. Milas Hock wait list and she will be contacted to get her scheduled once we have a slot open up for her.

## 2023-05-23 NOTE — Telephone Encounter (Signed)
 Inbound call from patient stating she has a referral to be scheduled for a colonoscopy. Patient was scheduled for a colonoscopy in 06/2022 with Dr. Tomasa Rand at [facility]. Patient is requesting a call to discuss scheduling. Please advise.

## 2023-05-29 ENCOUNTER — Ambulatory Visit: Admitting: Family Medicine

## 2023-05-29 ENCOUNTER — Encounter: Payer: Self-pay | Admitting: Family Medicine

## 2023-05-29 VITALS — BP 147/63 | HR 75 | Ht 63.0 in | Wt 267.2 lb

## 2023-05-29 DIAGNOSIS — M25551 Pain in right hip: Secondary | ICD-10-CM | POA: Diagnosis not present

## 2023-05-29 DIAGNOSIS — E039 Hypothyroidism, unspecified: Secondary | ICD-10-CM

## 2023-05-29 DIAGNOSIS — F331 Major depressive disorder, recurrent, moderate: Secondary | ICD-10-CM

## 2023-05-29 DIAGNOSIS — M25552 Pain in left hip: Secondary | ICD-10-CM

## 2023-05-29 DIAGNOSIS — E785 Hyperlipidemia, unspecified: Secondary | ICD-10-CM | POA: Diagnosis not present

## 2023-05-29 DIAGNOSIS — I10 Essential (primary) hypertension: Secondary | ICD-10-CM | POA: Diagnosis not present

## 2023-05-29 DIAGNOSIS — R748 Abnormal levels of other serum enzymes: Secondary | ICD-10-CM

## 2023-05-29 DIAGNOSIS — N289 Disorder of kidney and ureter, unspecified: Secondary | ICD-10-CM | POA: Diagnosis not present

## 2023-05-29 NOTE — Assessment & Plan Note (Addendum)
BP is stable on her current regimen.

## 2023-05-29 NOTE — Assessment & Plan Note (Addendum)
 Lab repeated today

## 2023-05-29 NOTE — Assessment & Plan Note (Addendum)
 Continue Tylenol prn Recent hip xray reviewed and discussed with her - normal result PT referral offered and order was placed Continue Tylenol as needed

## 2023-05-29 NOTE — Assessment & Plan Note (Addendum)
 No medication changes for now Recheck in 6 months due to recently elevated LDL

## 2023-05-29 NOTE — Assessment & Plan Note (Addendum)
 Had mistakenly circle question #9 on her PHQ9 screen However, she denies SI or HI Her depression is stable on her current regimen Monitor closely for now

## 2023-05-29 NOTE — Assessment & Plan Note (Addendum)
 Lab repeated I will contact her soon with her results

## 2023-05-29 NOTE — Progress Notes (Signed)
    SUBJECTIVE:   CHIEF COMPLAINT / HPI:   HTN: She is compliant with her Norvasc 5 mg QD and Coreg 6.25 mg BID. Here for F/U.  Hip pain:  B/L hip pain. Was previously worse on the left. However, her pain is mostly on the right today. Pain is about 6/20 in severity. No recent fall or trauma. Pain responds well to Tylenol prn.    Depression: Denies any major change in her mood. Her depression is mostly due to her hip pain. She is compliant with her Zoloft 100 mg QD. No SI/HI.   HLD:  Compliant with Coreg 10 mg every day.   Abnormal lab: Recently, abnormal renal and liver function as well as elevated TSH. She has been hydrating well and confirmed compliance with her Synthroid  125 mcg QD. No new concerns.  PERTINENT  PMH / PSH: PMHx reviewed  OBJECTIVE:   BP (!) 147/63   Pulse 75   Ht 5\' 3"  (1.6 m)   Wt 267 lb 3.2 oz (121.2 kg)   SpO2 99%   BMI 47.33 kg/m   Physical Exam Vitals reviewed.  Cardiovascular:     Rate and Rhythm: Normal rate and regular rhythm.     Heart sounds: Normal heart sounds. No murmur heard. Pulmonary:     Effort: Pulmonary effort is normal. No respiratory distress.     Breath sounds: Normal breath sounds.  Abdominal:     General: Abdomen is flat. Bowel sounds are normal. There is no distension.     Palpations: Abdomen is soft.     Tenderness: There is no abdominal tenderness.  Musculoskeletal:     Right lower leg: No edema.     Left lower leg: No edema.     Comments: Slow, but steady gait Limited ROM of both hip joints      ASSESSMENT/PLAN:   Assessment & Plan Hypothyroidism, unspecified type Lab repeated today Elevated liver enzymes Lab repeated I will contact her soon with her results HYPERTENSION, BENIGN SYSTEMIC BP is stable on her current regimen Hyperlipidemia, unspecified hyperlipidemia type No medication changes for now Recheck in 6 months due to recently elevated LDL Moderate episode of recurrent major depressive disorder  (HCC) Had mistakenly circle question #9 on her PHQ9 screen However, she denies SI or HI Her depression is stable on her current regimen Monitor closely for now Bilateral hip pain Continue Tylenol prn Recent hip xray reviewed and discussed with her - normal result PT referral offered and order was placed Continue Tylenol as needed Acute renal impairment Lab repeated today     Janit Pagan, MD [facility]

## 2023-05-29 NOTE — Patient Instructions (Signed)
 It was nice seeing you today. We will recheck your lab result and I will call soon to discuss result. Please, let me know if you have any questions.

## 2023-05-30 ENCOUNTER — Telehealth: Payer: Self-pay | Admitting: Family Medicine

## 2023-05-30 DIAGNOSIS — R748 Abnormal levels of other serum enzymes: Secondary | ICD-10-CM

## 2023-05-30 LAB — CMP14+EGFR
ALT: 62 IU/L — ABNORMAL HIGH (ref 0–32)
AST: 129 IU/L — ABNORMAL HIGH (ref 0–40)
Albumin: 3.8 g/dL — ABNORMAL LOW (ref 3.9–4.9)
Alkaline Phosphatase: 165 IU/L — ABNORMAL HIGH (ref 44–121)
BUN/Creatinine Ratio: 14 (ref 12–28)
BUN: 15 mg/dL (ref 8–27)
Bilirubin Total: 0.4 mg/dL (ref 0.0–1.2)
CO2: 24 mmol/L (ref 20–29)
Calcium: 9.6 mg/dL (ref 8.7–10.3)
Chloride: 99 mmol/L (ref 96–106)
Creatinine, Ser: 1.05 mg/dL — ABNORMAL HIGH (ref 0.57–1.00)
Globulin, Total: 3.7 g/dL (ref 1.5–4.5)
Glucose: 96 mg/dL (ref 70–99)
Potassium: 3.6 mmol/L (ref 3.5–5.2)
Sodium: 140 mmol/L (ref 134–144)
Total Protein: 7.5 g/dL (ref 6.0–8.5)
eGFR: 58 mL/min/{1.73_m2} — ABNORMAL LOW (ref >59)

## 2023-05-30 LAB — TSH: TSH: 4.71 u[IU]/mL — ABNORMAL HIGH (ref 0.450–4.500)

## 2023-05-30 NOTE — Telephone Encounter (Signed)
 Result discussed.  TSH improved - close to normal. We will continue the same regimen for now. Repeat in 4-6 weeks.  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 Avoid nephrotoxic agents. Keep well hydrated  Liver functions worsen. D/C Crestor and Fish oil are advised. F/U in 4-6 weeks for Cmet, Hepatitis panel.  Will go ahead and order RUQ now. I offered to schedule f/u for lab. She said she would call to schedule so she could check her calendar.

## 2023-05-31 ENCOUNTER — Telehealth: Payer: Self-pay

## 2023-05-31 NOTE — Addendum Note (Signed)
 Addended by: Janit Pagan T on: 05/31/2023 07:55 AM   Modules accepted: Orders

## 2023-05-31 NOTE — Telephone Encounter (Signed)
-----   Message from [facility] Jasmine December S sent at 05/31/2023  8:04 AM EDT ----- Ok to schedule at [facility].  Thank you Melvenia Beam

## 2023-05-31 NOTE — Telephone Encounter (Signed)
 Patient is scheduled to have US done at [facility] cone April 8th @1130 . Patient has been called and notified.

## 2023-06-01 LAB — ACUTE HEP PANEL AND HEP B SURFACE AB: Hepatitis B Surf Ab Quant: 58.8 m[IU]/mL

## 2023-06-01 LAB — SPECIMEN STATUS REPORT

## 2023-06-05 ENCOUNTER — Emergency Department (HOSPITAL_COMMUNITY)

## 2023-06-05 ENCOUNTER — Ambulatory Visit (HOSPITAL_COMMUNITY)
Admission: RE | Admit: 2023-06-05 | Discharge: 2023-06-05 | Disposition: A | Discharge status: Home / Self Care | Source: Ambulatory Visit | Attending: Family Medicine | Admitting: Family Medicine

## 2023-06-05 ENCOUNTER — Encounter (HOSPITAL_COMMUNITY): Payer: Self-pay

## 2023-06-05 ENCOUNTER — Emergency Department (HOSPITAL_COMMUNITY)
Admission: EM | Admit: 2023-06-05 | Discharge: 2023-06-05 | Disposition: A | Discharge status: Home / Self Care | Attending: Emergency Medicine | Admitting: Emergency Medicine

## 2023-06-05 ENCOUNTER — Encounter (HOSPITAL_COMMUNITY): Payer: Self-pay | Admitting: *Deleted

## 2023-06-05 ENCOUNTER — Other Ambulatory Visit: Payer: Self-pay

## 2023-06-05 DIAGNOSIS — K828 Other specified diseases of gallbladder: Secondary | ICD-10-CM | POA: Diagnosis not present

## 2023-06-05 DIAGNOSIS — R202 Paresthesia of skin: Secondary | ICD-10-CM | POA: Diagnosis not present

## 2023-06-05 DIAGNOSIS — R7989 Other specified abnormal findings of blood chemistry: Secondary | ICD-10-CM | POA: Diagnosis not present

## 2023-06-05 DIAGNOSIS — K8020 Calculus of gallbladder without cholecystitis without obstruction: Secondary | ICD-10-CM | POA: Diagnosis not present

## 2023-06-05 DIAGNOSIS — M5136 Other intervertebral disc degeneration, lumbar region with discogenic back pain only: Secondary | ICD-10-CM | POA: Diagnosis not present

## 2023-06-05 DIAGNOSIS — R748 Abnormal levels of other serum enzymes: Secondary | ICD-10-CM

## 2023-06-05 DIAGNOSIS — M4319 Spondylolisthesis, multiple sites in spine: Secondary | ICD-10-CM | POA: Diagnosis not present

## 2023-06-05 DIAGNOSIS — M79605 Pain in left leg: Secondary | ICD-10-CM | POA: Diagnosis not present

## 2023-06-05 DIAGNOSIS — M48061 Spinal stenosis, lumbar region without neurogenic claudication: Secondary | ICD-10-CM | POA: Diagnosis not present

## 2023-06-05 DIAGNOSIS — M79604 Pain in right leg: Secondary | ICD-10-CM | POA: Diagnosis not present

## 2023-06-05 DIAGNOSIS — K760 Fatty (change of) liver, not elsewhere classified: Secondary | ICD-10-CM | POA: Diagnosis not present

## 2023-06-05 LAB — CBC
HCT: 36.6 % (ref 36.0–46.0)
Hemoglobin: 12 g/dL (ref 12.0–15.0)
MCH: 30.8 pg (ref 26.0–34.0)
MCHC: 32.8 g/dL (ref 30.0–36.0)
MCV: 93.8 fL (ref 80.0–100.0)
Platelets: 187 10*3/uL (ref 150–400)
RBC: 3.9 MIL/uL (ref 3.87–5.11)
RDW: 14.4 % (ref 11.5–15.5)
WBC: 6.5 10*3/uL (ref 4.0–10.5)
nRBC: 0 % (ref 0.0–0.2)

## 2023-06-05 LAB — COMPREHENSIVE METABOLIC PANEL WITH GFR
ALT: 54 U/L — ABNORMAL HIGH (ref 0–44)
AST: 112 U/L — ABNORMAL HIGH (ref 15–41)
Albumin: 3 g/dL — ABNORMAL LOW (ref 3.5–5.0)
Alkaline Phosphatase: 105 U/L (ref 38–126)
Anion gap: 9 (ref 5–15)
BUN: 15 mg/dL (ref 8–23)
CO2: 28 mmol/L (ref 22–32)
Calcium: 9 mg/dL (ref 8.9–10.3)
Chloride: 99 mmol/L (ref 98–111)
Creatinine, Ser: 1.19 mg/dL — ABNORMAL HIGH (ref 0.44–1.00)
GFR, Estimated: 50 mL/min — ABNORMAL LOW (ref >60)
Glucose, Bld: 99 mg/dL (ref 70–99)
Potassium: 3.2 mmol/L — ABNORMAL LOW (ref 3.5–5.1)
Sodium: 136 mmol/L (ref 135–145)
Total Bilirubin: 0.6 mg/dL (ref 0.0–1.2)
Total Protein: 7.2 g/dL (ref 6.5–8.1)

## 2023-06-05 LAB — CK: Total CK: 218 U/L (ref 38–234)

## 2023-06-05 NOTE — ED Provider Notes (Signed)
 [place] EMERGENCY DEPARTMENT AT [facility] Provider Note   CSN: [id number] Arrival date & time: 06/05/23  1117     History {Add pertinent medical, surgical, social history, OB history to HPI:1} Chief Complaint  Patient presents with   Leg Pain    Andrea [name] is a 68 y.o. female.   Leg Pain  This patient is a 68 year old female, she has recently been treated with rosuvastatin which was started in about December, she reports that shortly after taking that medication she started to develop pain in her bilateral legs.  She is not a diabetic, she had never had leg pain in the past, in process of being worked up for this she had x-rays in February of her bilateral hips and pelvis which were unremarkable, in fact there is no signs of arthropathy.  She had followed up with her doctor again recently and on April 1 she had lab work which showed that she had elevated liver function test with an AST of 129 and an ALT of 62 with an albumin of 3.8 and a normal bilirubin.  This was not terribly unchanged from the month prior however it was up from a year ago.  An ultrasound was ordered and it was scheduled for today however the patient reports that when she walks the pain in her legs is so bad that she did not feel like she could make it to the ultrasound and thus came to the emergency department.  The pain is from her bilateral hips all the way down through her toes and she feels the tingling on both sides of her legs like pins-and-needles when she is walking.  When she is in the resting position the pain seems to go away.  She has no weakness of the legs and no numbness of the legs.  She has no back pain.    Home Medications Prior to Admission medications   Medication Sig Start Date End Date Taking? Authorizing Provider  albuterol (VENTOLIN HFA) 108 (90 Base) MCG/ACT inhaler Inhale 2 puffs into the lungs every 6 (six) hours as needed for wheezing or shortness of breath. Patient  not taking: Reported on 05/29/2023 05/04/23   Doreene Eland, MD  amLODipine (NORVASC) 5 MG tablet Take 1 tablet (5 mg total) by mouth at bedtime. 05/04/23   Doreene Eland, MD  anastrozole (ARIMIDEX) 1 MG tablet Take 1 tablet (1 mg total) by mouth daily. 06/28/22   Rachel Moulds, MD  carvedilol (COREG) 6.25 MG tablet TAKE 1 TABLET BY MOUTH TWICE DAILY WITH A MEAL 02/05/23   Janit Pagan T, MD  famotidine (PEPCID) 20 MG tablet TAKE 1 TABLET BY MOUTH ONCE DAILY AS NEEDED FOR  HEARTBURN  OR  INDIGESTION 12/19/22   Doreene Eland, MD  fenoprofen (NALFON) 600 MG TABS tablet Take 1,200 mg by mouth. Patient not taking: Reported on 05/29/2023    [provider]  ferrous sulfate 325 (65 FE) MG tablet Take 1 tablet (325 mg total) by mouth daily with breakfast. 05/22/21   Regalado, Belkys A, MD  fluticasone-salmeterol (ADVAIR HFA) 115-21 MCG/ACT inhaler Inhale 2 puffs into the lungs 2 (two) times daily. 03/31/22   Doreene Eland, MD  levothyroxine (SYNTHROID) 125 MCG tablet TAKE 1 TABLET BY MOUTH ONCE DAILY BEFORE BREAKFAST 02/22/23   Doreene Eland, MD  Melatonin 5 MG CHEW Chew 5 mg by mouth at bedtime as needed. Patient not taking: Reported on 05/29/2023 02/06/23   Doreene Eland, MD  Omega-3 Fatty Acids (FISH OIL) 1000 MG CAPS Take 1 capsule (1,000 mg total) by mouth in the morning and at bedtime. 08/04/20   Doreene Eland, MD  polyethylene glycol (MIRALAX / GLYCOLAX) 17 g packet Take 17 g by mouth 2 (two) times daily. Patient not taking: Reported on 05/29/2023 05/21/21   Regalado, Jon Billings A, MD  rosuvastatin (CRESTOR) 10 MG tablet Take 1 tablet (10 mg total) by mouth daily. 02/06/23   Doreene Eland, MD  sertraline (ZOLOFT) 50 MG tablet Take 2 tablets by mouth once daily 02/22/23   Doreene Eland, MD  Vitamin D, Cholecalciferol, 50 MCG (2000 UT) CAPS Take 2,000 Units by mouth daily.    [provider]      Allergies    Patient has no known allergies.    Review of Systems    Review of Systems  All other systems reviewed and are negative.   Physical Exam Updated Vital Signs BP 108/64 (BP Location: Left Arm)   Pulse 66   Temp 99.1 F (37.3 C)   Resp (!) 22   SpO2 98%  Physical Exam Vitals and nursing note reviewed.  Constitutional:      General: She is not in acute distress.    Appearance: She is well-developed.  HENT:     Head: Normocephalic and atraumatic.     Mouth/Throat:     Pharynx: No oropharyngeal exudate.  Eyes:     General: No scleral icterus.       Right eye: No discharge.        Left eye: No discharge.     Conjunctiva/sclera: Conjunctivae normal.     Pupils: Pupils are equal, round, and reactive to light.  Neck:     Thyroid: No thyromegaly.     Vascular: No JVD.  Cardiovascular:     Rate and Rhythm: Normal rate and regular rhythm.     Heart sounds: Normal heart sounds. No murmur heard.    No friction rub. No gallop.  Pulmonary:     Effort: Pulmonary effort is normal. No respiratory distress.     Breath sounds: Normal breath sounds. No wheezing or rales.  Abdominal:     General: Bowel sounds are normal. There is no distension.     Palpations: Abdomen is soft. There is no mass.     Tenderness: There is no abdominal tenderness.  Musculoskeletal:        General: No tenderness. Normal range of motion.     Cervical back: Normal range of motion and neck supple.     Right lower leg: No edema.     Left lower leg: No edema.     Comments: Able to lift both of her legs off of the bed, there is no weakness, she has normal sensation of the legs.  She has no pain with range of motion of the legs at the hips knees or ankles.  Lymphadenopathy:     Cervical: No cervical adenopathy.  Skin:    General: Skin is warm and dry.     Findings: No erythema or rash.  Neurological:     Mental Status: She is alert.     Coordination: Coordination normal.     Comments: Though she is able to lift both of her legs she does have some pain with doing so   Psychiatric:        Behavior: Behavior normal.     ED Results / Procedures / Treatments   Labs (all labs ordered are  listed, but only abnormal results are displayed) Labs Reviewed  CBC  COMPREHENSIVE METABOLIC PANEL WITH GFR  CK    EKG None  Radiology No results found.  Procedures Procedures  {Document cardiac monitor, telemetry assessment procedure when appropriate:1}  Medications Ordered in ED Medications - No data to display  ED Course/ Medical Decision Making/ A&P   {   Click here for ABCD2, HEART and other calculatorsREFRESH Note before signing :1}                              Medical Decision Making Amount and/or Complexity of Data Reviewed Labs: ordered. Radiology: ordered.   The patient has had symptoms in her bilateral legs that do not necessarily correlate with a statin, her statin was discontinued a week ago but she continues to have persistent symptoms in her legs, her vital signs are unremarkable, I will check a creatine kinase to make sure she does not have some type of myopathy from the statin however having pain that is worse with standing and walking with neurologic symptoms in both legs also makes me concerned for spinal stenosis.  MRI ordered, patient agreeable  {Document critical care time when appropriate:1} {Document review of labs and clinical decision tools ie heart score, Chads2Vasc2 etc:1}  {Document your independent review of radiology images, and any outside records:1} {Document your discussion with family members, caretakers, and with consultants:1} {Document social determinants of health affecting pt's care:1} {Document your decision making why or why not admission, treatments were needed:1} Final Clinical Impression(s) / ED Diagnoses Final diagnoses:  None    Rx / DC Orders ED Discharge Orders     None

## 2023-06-05 NOTE — Discharge Instructions (Addendum)
 We evaluated you for your leg pain.  Your MRI did not show any signs of injury to your spinal cord or compression of your spinal cord.  Your MRI did show signs of a pinched nerve from a bulging disc.  This could be the cause of your pain.  We also check laboratory test to check for inflammation in your muscles, and this test was negative.  We also obtained an ultrasound of your abdomen.  Your abdominal ultrasound does show some gallstones, which can sometimes cause abdominal pain.  We do not think that you have an infection in your gallbladder.  If you do develop any abdominal pain in your upper abdomen or fevers, chills, nausea or vomiting, please return to the emergency department.  Please also return to the emergency department if you develop any other new symptoms such as severe or worsening pain, leg swelling, fevers or chills, chest pain or difficulty breathing, numbness or tingling in your legs, inability to move your legs, bowel or bladder incontinence, or any other new symptoms.

## 2023-06-05 NOTE — ED Triage Notes (Addendum)
 Pt has had leg pain "for a while".  Pt denies any recent trauma.  Pain has been for over month

## 2023-06-05 NOTE — ED Notes (Signed)
 Pt being transported to u/s .

## 2023-06-05 NOTE — ED Provider Notes (Signed)
   ED Course / MDM   Clinical Course as of 06/05/23 1652  Tue Jun 05, 2023  1315 Comprehensive metabolic panel [BM]  1517 Received sign out pending labs, MRI spine. Presenting with leg pain.  [WS]  1646 MRI negative for spinal stenosis, spinal cord compression, cauda equina syndrome. CK is negative. MRI does show possibility of L5 radiculopathy which may be related to patient's pain. Discussed physical therapy. No history of trauma to suggest any occult fracture. XR was previously completed by PMD for this and negative. Recommended close PMD follow up.  Patient has had abdominal ultrasound performed which showed gallstones but no wall thickening or pericholecystic fluid to suggest acute cholecystitis or other acute biliary process.  Will discharge patient to home. All questions answered. Patient comfortable with plan of discharge. Return precautions discussed with patient and specified on the after visit summary.  [WS]    Clinical Course User Index [BM] Eber Hong, MD [WS] Lonell Grandchild, MD   Medical Decision Making Amount and/or Complexity of Data Reviewed Labs: ordered. Decision-making details documented in ED Course. Radiology: ordered.         Lonell Grandchild, MD 06/05/23 [phone]

## 2023-06-06 ENCOUNTER — Telehealth: Payer: Self-pay

## 2023-06-06 NOTE — Transitions of Care (Post Inpatient/ED Visit) (Signed)
 06/06/2023  Name: Andrea [name] MRN: [id number] DOB: 11-Oct-1955  Today's TOC FU Call Status: Today's TOC FU Call Status:: Successful TOC FU Call Completed TOC FU Call Complete Date: 06/06/23 Patient's Name and Date of Birth confirmed.  Transition Care Management Follow-up Telephone Call Date of Discharge: 06/05/23 Discharge Facility: [facility]) Type of Discharge: Emergency Department Reason for ED Visit: Other: (left leg pain) How have you been since you were released from the hospital?: Better Any questions or concerns?: No  Items Reviewed: Did you receive and understand the discharge instructions provided?: Yes Medications obtained,verified, and reconciled?: Yes (Medications Reviewed) Any new allergies since your discharge?: No Dietary orders reviewed?: Yes Do you have support at home?: Yes People in Home [RPT]: sibling(s)  Medications Reviewed Today: Medications Reviewed Today     Reviewed by Karena Addison, LPN (Licensed Practical Nurse) on 06/06/23 at [phone]  Med List Status: <None>   Medication Order Taking? Sig Documenting Provider Last Dose Status Informant  albuterol (VENTOLIN HFA) 108 (90 Base) MCG/ACT inhaler [id number] No Inhale 2 puffs into the lungs every 6 (six) hours as needed for wheezing or shortness of breath.  Patient not taking: Reported on 05/29/2023   Doreene Eland, MD Not Taking Active   amLODipine (NORVASC) 5 MG tablet [id number] No Take 1 tablet (5 mg total) by mouth at bedtime. Doreene Eland, MD Taking Active   anastrozole (ARIMIDEX) 1 MG tablet [id number] No Take 1 tablet (1 mg total) by mouth daily. Rachel Moulds, MD Taking Active   carvedilol (COREG) 6.25 MG tablet [id number] No TAKE 1 TABLET BY MOUTH TWICE DAILY WITH A MEAL Doreene Eland, MD Taking Active   famotidine (PEPCID) 20 MG tablet [id number] No TAKE 1 TABLET BY MOUTH ONCE DAILY AS NEEDED FOR  HEARTBURN  OR  INDIGESTION Doreene Eland, MD Taking Active   fenoprofen  (NALFON) 600 MG TABS tablet [id number] No Take 1,200 mg by mouth.  Patient not taking: Reported on 05/29/2023   [provider] Not Taking Active   ferrous sulfate 325 (65 FE) MG tablet [id number] No Take 1 tablet (325 mg total) by mouth daily with breakfast. Alba Cory, MD Taking Active            Med Note Manson Passey Mar 09, 2022  2:34 PM) Not taking   fluticasone-salmeterol (ADVAIR [facility]) 115-21 MCG/ACT inhaler [id number] No Inhale 2 puffs into the lungs 2 (two) times daily. Doreene Eland, MD Taking Active   levothyroxine (SYNTHROID) 125 MCG tablet [id number] No TAKE 1 TABLET BY MOUTH ONCE DAILY BEFORE BREAKFAST Doreene Eland, MD Taking Active   Melatonin 5 MG CHEW [id number] No Chew 5 mg by mouth at bedtime as needed.  Patient not taking: Reported on 05/29/2023   Doreene Eland, MD Not Taking Active   Omega-3 Fatty Acids (FISH OIL) 1000 MG CAPS [id number] No Take 1 capsule (1,000 mg total) by mouth in the morning and at bedtime. Doreene Eland, MD Taking Active Self  polyethylene glycol (MIRALAX / GLYCOLAX) 17 g packet [id number] No Take 17 g by mouth 2 (two) times daily.  Patient not taking: Reported on 05/29/2023   Hartley Barefoot A, MD Not Taking Active   rosuvastatin (CRESTOR) 10 MG tablet [id number] No Take 1 tablet (10 mg total) by mouth daily. Doreene Eland, MD Taking Active   sertraline (ZOLOFT) 50 MG tablet [id number] No Take 2 tablets by mouth once daily Doreene Eland, MD  Taking Active   Vitamin D, Cholecalciferol, 50 MCG (2000 UT) CAPS [id number] No Take 2,000 Units by mouth daily. [provider] Taking Active Self            Home Care and Equipment/Supplies: Were Home Health Services Ordered?: NA Any new equipment or medical supplies ordered?: NA  Functional Questionnaire: Do you need assistance with bathing/showering or dressing?: No Do you need assistance with meal preparation?: No Do you need assistance with eating?:  No Do you have difficulty maintaining continence: No Do you need assistance with getting out of bed/getting out of a chair/moving?: No Do you have difficulty managing or taking your medications?: No  Follow up appointments reviewed: PCP Follow-up appointment confirmed?: Yes Date of PCP follow-up appointment?: 06/29/23 Follow-up Provider: [facility] Follow-up appointment confirmed?: NA Do you need transportation to your follow-up appointment?: No Do you understand care options if your condition(s) worsen?: Yes-patient verbalized understanding    SIGNATURE Karena Addison, LPN [facility] Nurse Health Advisor Direct Dial [phone]

## 2023-06-19 ENCOUNTER — Other Ambulatory Visit: Payer: Self-pay | Admitting: Family Medicine

## 2023-06-29 ENCOUNTER — Ambulatory Visit (INDEPENDENT_AMBULATORY_CARE_PROVIDER_SITE_OTHER): Admitting: Family Medicine

## 2023-06-29 ENCOUNTER — Encounter: Payer: Self-pay | Admitting: Family Medicine

## 2023-06-29 ENCOUNTER — Telehealth: Payer: Self-pay

## 2023-06-29 VITALS — BP 137/96 | HR 82 | Ht 63.0 in | Wt 263.2 lb

## 2023-06-29 DIAGNOSIS — I10 Essential (primary) hypertension: Secondary | ICD-10-CM

## 2023-06-29 DIAGNOSIS — M5416 Radiculopathy, lumbar region: Secondary | ICD-10-CM | POA: Diagnosis not present

## 2023-06-29 DIAGNOSIS — M25551 Pain in right hip: Secondary | ICD-10-CM | POA: Diagnosis not present

## 2023-06-29 DIAGNOSIS — R748 Abnormal levels of other serum enzymes: Secondary | ICD-10-CM

## 2023-06-29 DIAGNOSIS — N1831 Chronic kidney disease, stage 3a: Secondary | ICD-10-CM | POA: Insufficient documentation

## 2023-06-29 MED ORDER — GABAPENTIN 100 MG PO CAPS: 100.0000 mg | ORAL_CAPSULE | Freq: Two times a day (BID) | ORAL | 0 refills | Status: DC

## 2023-06-29 MED ORDER — TRAMADOL HCL 50 MG PO TABS: 50.0000 mg | ORAL_TABLET | Freq: Two times a day (BID) | ORAL | 0 refills | Status: AC | PRN

## 2023-06-29 NOTE — Progress Notes (Signed)
    SUBJECTIVE:   CHIEF COMPLAINT / HPI:   Back Pain This is a recurrent problem. Episode onset: Started many months ago, but now worse. The problem has been waxing and waning since onset. The pain is present in the lumbar spine. The quality of the pain is described as aching. The pain radiates to the right knee, right foot and right thigh. The pain is at a severity of 7/10. The pain is moderate. The pain is Worse during the day (Worse with ambulation and improves by rest). Associated symptoms include paresthesias. Pertinent negatives include no bladder incontinence, bowel incontinence, dysuria, numbness or weakness. (Tingling in her feet) Treatments tried: ASA.   Elevated Liver enzymes: Denies any abdominal pain. She got liver US  done at the hospital.   HTN: Took all her home medication.    PERTINENT  PMH / PSH: PMHx reviewed  OBJECTIVE:   BP (!) 137/96   Pulse 82   Ht 5\' 3"  (1.6 m)   Wt 263 lb 3.2 oz (119.4 kg)   SpO2 96%   BMI 46.62 kg/m   Physical Exam Vitals and nursing note reviewed.  Cardiovascular:     Rate and Rhythm: Normal rate and regular rhythm.     Heart sounds: Normal heart sounds. No murmur heard. Pulmonary:     Effort: Pulmonary effort is normal. No respiratory distress.     Breath sounds: Normal breath sounds. No wheezing.  Musculoskeletal:     Right lower leg: No edema.     Left lower leg: No edema.     Comments: Gait slow due to pain but steady. Limited ROM of her right hip due to pain. No neurologic deficit.      ASSESSMENT/PLAN:   Assessment & Plan Lumbar radiculopathy  ED MRI reviewed - Grade 1 spondylolisthesis at L5-S1 with disc and endplate degeneration, up to severe facet degeneration. No spinal or lateral recess stenosis. Up to moderate foraminal stenosis. Query L5 radiculitis. 2. No other significant lumbar disc degeneration. Intermittent facet hypertrophy. Capacious spinal canal. hip xray from March was normal Continue Tylenol  prn  pain Tramadol for breakthrough pain escribed Gabapentin 100 mg BID for radiculopathic pain PT and orthopedic referral discussed, and she agreed with the plan Referral placed Elevated liver enzymes No GI symptoms Recent repeat lab from the hospital reviewed with some improvement RUQ US  shows fatty liver Weight loss management discussed She will continue to hold cresto and fish oil  for now She will work on this HYPERTENSION, BENIGN SYSTEMIC BP elevated (diastolic) - likely due to pain Monitor for now     Penni Bowman, MD [facility]

## 2023-06-29 NOTE — Assessment & Plan Note (Addendum)
  ED MRI reviewed - Grade 1 spondylolisthesis at L5-S1 with disc and endplate degeneration, up to severe facet degeneration. No spinal or lateral recess stenosis. Up to moderate foraminal stenosis. Query L5 radiculitis. 2. No other significant lumbar disc degeneration. Intermittent facet hypertrophy. Capacious spinal canal. hip xray from March was normal Continue Tylenol  prn pain Tramadol for breakthrough pain escribed Gabapentin 100 mg BID for radiculopathic pain PT and orthopedic referral discussed, and she agreed with the plan Referral placed

## 2023-06-29 NOTE — Assessment & Plan Note (Addendum)
 BP elevated (diastolic) - likely due to pain Monitor for now

## 2023-06-29 NOTE — Assessment & Plan Note (Addendum)
 No GI symptoms Recent repeat lab from the hospital reviewed with some improvement RUQ US  shows fatty liver Weight loss management discussed She will continue to hold cresto and fish oil  for now She will work on this

## 2023-06-29 NOTE — Telephone Encounter (Signed)
 Received call from [facility] pharmacy regarding rx for Tramadol.   Pharmacist reports that due to Tramadol being controlled substance and this being patient's first time being prescribed this medication, they are only able to dispense a 7 day supply.   Pharmacist states that this is a [employer] related to controlled substances.   Pharmacist states that they will void out the rest of today's prescription and only dispense 7 day supply. She reports that if patient needs continued treatment with Tramadol, provider will need to send in another prescription after 7 days.   Pharmacist states that they will provide counseling of this to patient when she picks up prescription.   Will forward to PCP.   Elsie Halo, RN

## 2023-06-29 NOTE — Patient Instructions (Addendum)
 It was nice seeing you today. I am sorry about your pain. 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  I sent in Tramadol as needed for pain to the pharmacy. Please use only if Tylenol  is not effective. This is not a chronic medication. I also started you on Gabapentin for nerve pain. I will go ahead and refer you to a spine specialist and physical therapy. Please see me back in 2 week or sooner if symptoms worsen.

## 2023-07-05 ENCOUNTER — Telehealth: Payer: Self-pay

## 2023-07-05 NOTE — Telephone Encounter (Signed)
 Patient calls nurse line in regards to Gabapentin.   She reports confusion on if this medication is considered an opiate or not. She reports she received (2) and reports she was told by pharmacy "one" was considered an opiate.   Advised she was prescribed Gabapentin and Tramadol on 5/2. Advised Tramadol is considered an opiate vs Gabapentin.   Patient was appreciative of information.   All questions answered.

## 2023-07-10 ENCOUNTER — Other Ambulatory Visit: Payer: Self-pay

## 2023-07-10 ENCOUNTER — Ambulatory Visit: Attending: Family Medicine

## 2023-07-10 DIAGNOSIS — M79605 Pain in left leg: Secondary | ICD-10-CM

## 2023-07-10 DIAGNOSIS — R2689 Other abnormalities of gait and mobility: Secondary | ICD-10-CM | POA: Diagnosis not present

## 2023-07-10 DIAGNOSIS — M25552 Pain in left hip: Secondary | ICD-10-CM | POA: Insufficient documentation

## 2023-07-10 DIAGNOSIS — M6281 Muscle weakness (generalized): Secondary | ICD-10-CM

## 2023-07-10 DIAGNOSIS — M79604 Pain in right leg: Secondary | ICD-10-CM

## 2023-07-10 DIAGNOSIS — M25551 Pain in right hip: Secondary | ICD-10-CM | POA: Diagnosis not present

## 2023-07-10 NOTE — Therapy (Signed)
 OUTPATIENT PHYSICAL THERAPY THORACOLUMBAR EVALUATION   Patient Name: Andrea [name] MRN: [id number] DOB:1955/10/01, 68 y.o., female Today's Date: 07/10/2023  END OF SESSION:  PT End of Session - 07/10/23 1302     Visit Number 1    Number of Visits 17    Date for PT Re-Evaluation 09/04/23    Authorization Type [employer] Dual Complete    PT Start Time 1148    PT Stop Time 1225    PT Time Calculation (min) 37 min    Activity Tolerance Patient tolerated treatment well    Behavior During Therapy WFL for tasks assessed/performed             Past Medical History:  Diagnosis Date   Abnormal mammogram 08/28/2014   08/28/14 - Probable benign microcalcifications over the outer mid to lower left breast. Bi-Rads 3. F/U 6 months    Allergic rhinitis 06/22/2014   Arm pain, lateral, left 04/28/2016   Asthma    Breast cancer (HCC) 03/16/2022   Carpal tunnel syndrome 04/26/2006   Qualifier: Diagnosis of  By: Serina Dane MD, Aaron     Femur fracture [facility]) 05/14/2021   Femur fracture, right (HCC) 05/14/2021   First degree AV block 09/22/2013   EKG 09/22/13    GANGLION CYST, WRIST, RIGHT 10/06/2008   Qualifier: Diagnosis of  By: Redia Candela  MD, Taineisha     Hypothyroidism    Imbalance 01/12/2017   Palpitations 09/22/2013   Right wrist pain 04/28/2016   Past Surgical History:  Procedure Laterality Date   ABDOMINAL HYSTERECTOMY     fibroids   BREAST BIOPSY Left 01/24/2022   MM LT BREAST BX W LOC DEV 1ST LESION IMAGE BX SPEC STEREO GUIDE 01/24/2022 [facility]   BREAST BIOPSY  03/15/2022   MM LT RADIOACTIVE SEED LOC MAMMO GUIDE 03/15/2022 [facility]   BREAST LUMPECTOMY WITH RADIOACTIVE SEED LOCALIZATION Left 03/16/2022   Procedure: LEFT BREAST LUMPECTOMY WITH RADIOACTIVE SEED LOCALIZATION;  Surgeon: Sim Dryer, MD;  Location: [facility];  Service: General;  Laterality: Left;   FEMUR IM NAIL Right 05/14/2021   Procedure: INTRAMEDULLARY (IM) RETROGRADE FEMORAL  NAILING;  Surgeon: Virl Grimes, MD;  Location: [facility];  Service: Orthopedics;  Laterality: Right;   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   Normocytic anemia 07/12/2021   History of femur fracture 07/11/2021   Seasonal allergies 04/08/2021   Restrictive lung disease 03/25/2021   Prediabetes 03/25/2021   Thoracic aortic ectasia (HCC) 09/07/2020   Congestive heart failure (HCC) 08/03/2020   Esophageal reflux 08/14/2017   Vitamin D  deficiency 01/12/2017   Cataract 09/19/2013   Hypothyroidism 04/26/2006   Hyperlipidemia 04/26/2006   BMI 45.0-49.9, adult (HCC) 04/26/2006   Major depressive disorder, recurrent episode (HCC) 04/26/2006   HYPERTENSION, BENIGN SYSTEMIC 04/26/2006    PCP: Arn Lane, MD  REFERRING PROVIDER: Arn Lane, MD  REFERRING DIAG:  M54.16 (ICD-10-CM) - Lumbar radiculopathy M25.551 (ICD-10-CM) - Right hip pain  Rationale for Evaluation and Treatment: Rehabilitation  THERAPY DIAG:  Pain in right leg  Pain in left leg  Muscle weakness (generalized)  Other abnormalities of gait and mobility  ONSET DATE: Chronic  SUBJECTIVE:  SUBJECTIVE STATEMENT: Pt presents to PT with reports of chronic LE pain with occasional LBP. Notes that her walking and standing activity tolerance is getting significant less with only 4/5 minutes. Pain initially started when she fell in pool steps and broke her femur. Denies b/b changes or saddle anesthesia. Numbness in both LE and reports of  severe tingling and pain in bilateral feet with prolonged standing. R used to be worse but now they are about even.   PERTINENT HISTORY:  L femur fx, HTN, CHF, breast cancer  PAIN:  Are you having pain?  Yes: NPRS scale: 0/10 Worst: 10/10 Pain location: bilateral LE (posterior/lateral hips) Pain description: sharp, numb Aggravating factors: walking, standing Relieving factors: medication   PRECAUTIONS: None  RED FLAGS: None   WEIGHT BEARING RESTRICTIONS: No  FALLS:  Has patient fallen in last 6 months? No  LIVING ENVIRONMENT: Lives with: lives with their family Lives in: House/apartment Stairs: Yes: External: 4 steps; on right going up Has following equipment at home: None  OCCUPATION: Retired Designer, industrial/product  PLOF: Independent  PATIENT GOALS: improve standing tolerance, decrease pain  NEXT MD VISIT: 08/14/2023  OBJECTIVE:  Note: Objective measures were completed at Evaluation unless otherwise noted.  DIAGNOSTIC FINDINGS:  See imaging for recent lumbar MRI  PATIENT SURVEYS:   LEFS: 33/80  COGNITION: Overall cognitive status: Within functional limits for tasks assessed     SENSATION: Light touch: Impaired - anterior shin R  MUSCLE LENGTH: Ikner test: Right (+); Left (+)  POSTURE: rounded shoulders, forward head, increased lumbar lordosis, and large body habitus   PALPATION: TTP to bilateral posterior/lateral hip  LOWER EXTREMITY ROM:     Active  Right eval Left eval  Hip flexion    Hip extension    Hip abduction    Hip adduction    Hip internal rotation    Hip external rotation    Knee flexion [facility] WFL  Knee extension    Ankle dorsiflexion    Ankle plantarflexion    Ankle inversion    Ankle eversion     (Blank rows = not tested)  LOWER EXTREMITY MMT:    MMT Right eval Left eval  Hip flexion 4 3+  Hip extension    Hip abduction 3+ 3+  Hip adduction    Hip internal rotation    Hip external rotation    Knee flexion 4 4  Knee extension  4 4  Ankle dorsiflexion    Ankle plantarflexion    Ankle inversion    Ankle eversion     (Blank rows = not tested)  LUMBAR SPECIAL TESTS:  Straight leg raise test: Negative and Slump test: Negative  FUNCTIONAL TESTS:  30 Second Sit to Stand: 4 reps with UE  GAIT: Distance walked: 7ft Assistive device utilized: None Level of assistance: SBA Comments: flexed trunk, decreased gait speed  TREATMENT: [facility] Adult PT Treatment:                                                DATE: 07/10/2023 Therapeutic Exercise: Supine QS x 5 - 5" hold SLR x 5 each Hooklying clamshell x 15 GTB Bridge (small range) x 5  PATIENT EDUCATION:  Education details: eval findings, ODI, HEP, POC Person educated: Patient Education method: Explanation, Demonstration, and Handouts Education comprehension: verbalized understanding and returned demonstration  HOME EXERCISE PROGRAM: Access Code:  [email or web address] URL: [email or web address] Date: 07/10/2023 Prepared by: Loral Roch  Exercises - Supine Quadricep Sets  - 1 x daily - 7 x weekly - 2 sets - 10 reps - 5 sec hold - Active Straight Leg Raise with Quad Set  - 1 x daily - 7 x weekly - 2 sets - 10 reps - Hooklying Clamshell with Resistance  - 1 x daily - 7 x weekly - 2-3 sets - 15 reps - green band hold - Beginner Bridge  - 1 x daily - 7 x weekly - 2 sets - 10 reps  ASSESSMENT:  CLINICAL IMPRESSION: Patient is a 68 y.o. F who was seen today for physical therapy evaluation and treatment for chronic LE pain with occasional LBP. Physical findings are consistent with MD impression as pt demonstrates decrease in core and LE strength and functional mobility. LEFS score shows severe disability in performance of home ADLs and community activities. Pt would benefit from skilled PT services working on improving strength, activity tolerance, and mobility while decreasing pain.    OBJECTIVE IMPAIRMENTS: Abnormal gait, decreased activity tolerance, decreased  endurance, decreased mobility, difficulty walking, decreased ROM, decreased strength, and pain  ACTIVITY LIMITATIONS: carrying, lifting, standing, squatting, stairs, transfers, and locomotion level  PARTICIPATION LIMITATIONS: meal prep, cleaning, driving, shopping, community activity, occupation, and yard work  PERSONAL FACTORS: Time since onset of injury/illness/exacerbation and 3+ comorbidities: L femur fx, HTN, CHF, breast cancer are also affecting patient's functional outcome.   REHAB POTENTIAL: Good  CLINICAL DECISION MAKING: Evolving/moderate complexity  EVALUATION COMPLEXITY: Moderate   GOALS: Goals reviewed with patient? No  SHORT TERM GOALS: Target date: 07/31/2023  Pt will be compliant and knowledgeable with initial HEP for improved comfort and carryover Baseline: initial HEP given Goal status: INITIAL  2.  Pt will self report LE pain no greater than 7/10 for improved comfort and functional ability Baseline: 10/10 at worst Goal status: INITIAL   LONG TERM GOALS: Target date: 09/04/2023   Pt will improve LEFS to no less than 40/80 as proxy for functional improvement with home ADLs and higher level community activity with home ADLs and community activities Baseline: 33/80 Goal status: INITIAL   2.  Pt will self report LE pain no greater than 3/10 for improved comfort and functional ability Baseline: 10/10 at worst Goal status: INITIAL   3.  Pt will increase 30 Second Sit to Stand rep count to no less than 8 reps for improved balance, strength, and functional mobility Baseline: 4 reps with UE Goal status: INITIAL   4.  Pt will improve standing activity tolerance to no less than 20 minutes for improved comfort and functional ability with shopping and church activities Baseline: 4-5 minute Goal status: INITIAL  5.  Pt will improve all LE MMT to no less than 4/5 for improved functional mobility and decrease pain Baseline:  Goal status: INITIAL  PLAN:  PT FREQUENCY:  2x/week  PT DURATION: 8 weeks  PLANNED INTERVENTIONS: 97164- PT Re-evaluation, 97110-Therapeutic exercises, 97530- Therapeutic activity, [id number]- Neuromuscular re-education, 97535- Self Care, [id number]- Manual therapy, [id number]- Gait training, [id number]- Aquatic Therapy, [id number]- Electrical stimulation (unattended), [id number]- Electrical stimulation (manual), 97016- Vasopneumatic device, Cryotherapy, and Moist heat.  PLAN FOR NEXT SESSION: assess HEP response, strengthening in gym and aquatic environments, standing activity tolerances   Ivor Mars, PT 07/10/2023, 1:03 PM

## 2023-07-24 ENCOUNTER — Ambulatory Visit: Admitting: Physical Therapy

## 2023-07-24 DIAGNOSIS — M79604 Pain in right leg: Secondary | ICD-10-CM

## 2023-07-24 DIAGNOSIS — M6281 Muscle weakness (generalized): Secondary | ICD-10-CM

## 2023-07-24 DIAGNOSIS — M25552 Pain in left hip: Secondary | ICD-10-CM | POA: Diagnosis not present

## 2023-07-24 DIAGNOSIS — M79605 Pain in left leg: Secondary | ICD-10-CM

## 2023-07-24 DIAGNOSIS — M25551 Pain in right hip: Secondary | ICD-10-CM | POA: Diagnosis not present

## 2023-07-24 DIAGNOSIS — R2689 Other abnormalities of gait and mobility: Secondary | ICD-10-CM

## 2023-07-24 NOTE — Therapy (Signed)
 OUTPATIENT PHYSICAL THERAPY TREATMENT   Patient Name: JANAN BOGIE MRN: [id number] DOB:19-Jan-1956, 68 y.o., female Today's Date: 07/24/2023  END OF SESSION:  PT End of Session - 07/24/23 1044     Visit Number 2    Number of Visits 17    Date for PT Re-Evaluation 09/04/23    Authorization Type U[employer] Dual Complete    PT Start Time 1044    PT Stop Time 1127    PT Time Calculation (min) 43 min    Activity Tolerance Patient tolerated treatment well    Behavior During Therapy WFL for tasks assessed/performed              Past Medical History:  Diagnosis Date   Abnormal mammogram 08/28/2014   08/28/14 - Probable benign microcalcifications over the outer mid to lower left breast. Bi-Rads 3. F/U 6 months    Allergic rhinitis 06/22/2014   Arm pain, lateral, left 04/28/2016   Asthma    Breast cancer (HCC) 03/16/2022   Carpal tunnel syndrome 04/26/2006   Qualifier: Diagnosis of  By: Serina Dane MD, Aaron     Femur fracture [facility]) 05/14/2021   Femur fracture, right (HCC) 05/14/2021   First degree AV block 09/22/2013   EKG 09/22/13    GANGLION CYST, WRIST, RIGHT 10/06/2008   Qualifier: Diagnosis of  By: Redia Candela  MD, Taineisha     Hypothyroidism    Imbalance 01/12/2017   Palpitations 09/22/2013   Right wrist pain 04/28/2016   Past Surgical History:  Procedure Laterality Date   ABDOMINAL HYSTERECTOMY     fibroids   BREAST BIOPSY Left 01/24/2022   MM LT BREAST BX W LOC DEV 1ST LESION IMAGE BX SPEC STEREO GUIDE 01/24/2022 [facility]   BREAST BIOPSY  03/15/2022   MM LT RADIOACTIVE SEED LOC MAMMO GUIDE 03/15/2022 [facility]   BREAST LUMPECTOMY WITH RADIOACTIVE SEED LOCALIZATION Left 03/16/2022   Procedure: LEFT BREAST LUMPECTOMY WITH RADIOACTIVE SEED LOCALIZATION;  Surgeon: Sim Dryer, MD;  Location: [facility];  Service: General;  Laterality: Left;   FEMUR IM NAIL Right 05/14/2021   Procedure: INTRAMEDULLARY (IM) RETROGRADE FEMORAL NAILING;   Surgeon: Virl Grimes, MD;  Location: [facility];  Service: Orthopedics;  Laterality: Right;   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   Normocytic anemia 07/12/2021   History of femur fracture 07/11/2021   Seasonal allergies 04/08/2021   Restrictive lung disease 03/25/2021   Prediabetes 03/25/2021   Thoracic aortic ectasia (HCC) 09/07/2020   Congestive heart failure (HCC) 08/03/2020   Esophageal reflux 08/14/2017   Vitamin D  deficiency 01/12/2017   Cataract 09/19/2013   Hypothyroidism 04/26/2006   Hyperlipidemia 04/26/2006   BMI 45.0-49.9, adult (HCC) 04/26/2006   Major depressive disorder, recurrent episode (HCC) 04/26/2006   HYPERTENSION, BENIGN SYSTEMIC 04/26/2006    PCP: Arn Lane, MD  REFERRING PROVIDER: Arn Lane, MD  REFERRING DIAG:  M54.16 (ICD-10-CM) - Lumbar radiculopathy M25.551 (ICD-10-CM) - Right hip pain  Rationale for Evaluation and Treatment: Rehabilitation  THERAPY DIAG:  Pain in right leg  Pain in left leg  Muscle weakness (generalized)  Other abnormalities of gait and mobility  ONSET DATE: Chronic  SUBJECTIVE:  SUBJECTIVE STATEMENT: 07/24/2023 "Its getting better, no pain maybe little numbness."   PERTINENT HISTORY:  L femur fx, HTN, CHF, breast cancer  PAIN:  Are you having pain?  Yes: NPRS scale: 0/10 Worst: 10/10 Pain location: bilateral LE (posterior/lateral hips) Pain description: sharp, numb Aggravating factors: walking, standing Relieving  factors: medication   PRECAUTIONS: None  RED FLAGS: None   WEIGHT BEARING RESTRICTIONS: No  FALLS:  Has patient fallen in last 6 months? No  LIVING ENVIRONMENT: Lives with: lives with their family Lives in: House/apartment Stairs: Yes: External: 4 steps; on right going up Has following equipment at home: None  OCCUPATION: Retired Designer, industrial/product  PLOF: Independent  PATIENT GOALS: improve standing tolerance, decrease pain  NEXT MD VISIT: 08/14/2023  OBJECTIVE:  Note: Objective measures were completed at Evaluation unless otherwise noted.  DIAGNOSTIC FINDINGS:  See imaging for recent lumbar MRI  PATIENT SURVEYS:   LEFS: 33/80  COGNITION: Overall cognitive status: Within functional limits for tasks assessed     SENSATION: Light touch: Impaired - anterior shin R  MUSCLE LENGTH: Fabrizio test: Right (+); Left (+)  POSTURE: rounded shoulders, forward head, increased lumbar lordosis, and large body habitus   PALPATION: TTP to bilateral posterior/lateral hip  LOWER EXTREMITY ROM:     Active  Right eval Left eval  Hip flexion    Hip extension    Hip abduction    Hip adduction    Hip internal rotation    Hip external rotation    Knee flexion [facility] WFL  Knee extension    Ankle dorsiflexion    Ankle plantarflexion    Ankle inversion    Ankle eversion     (Blank rows = not tested)  LOWER EXTREMITY MMT:    MMT Right eval Left eval  Hip flexion 4 3+  Hip extension    Hip abduction 3+ 3+  Hip adduction    Hip internal rotation    Hip external rotation    Knee flexion 4 4  Knee extension 4 4  Ankle dorsiflexion    Ankle plantarflexion    Ankle inversion    Ankle eversion     (Blank rows = not tested)  LUMBAR SPECIAL TESTS:  Straight leg raise test: Negative and Slump test: Negative  FUNCTIONAL TESTS:  30 Second Sit to Stand: 4 reps with UE  GAIT: Distance walked: 61ft Assistive device utilized: None Level of assistance: SBA Comments: flexed trunk,  decreased gait speed  TREATMENT: [facility] Adult PT Treatment:                                                DATE: 07/24/23 MTPR along the L piriformis using tennis ball Seated piriformis stretch LLE 2 x 30 sec Bridge 2 x 10  Sustained hip abduction 2 x 10 with Blue theraband UE ball press 2 x 10 holding 2 sec ea. Log rolling supine> sit position Sit to stand 2 x 5 and reviewed mechanics.  Nu-step l5 x 5 min UE/LE Updated HEP for sit to stand  [facility] Adult PT Treatment:                                                DATE: 07/10/2023 Therapeutic Exercise:  Supine QS x 5 - 5" hold SLR x 5 each Hooklying clamshell x 15 GTB Bridge (small range) x 5  PATIENT EDUCATION:  Education details: eval findings, ODI, HEP, POC Person educated: Patient Education method: Explanation, Demonstration, and Handouts Education comprehension: verbalized understanding and returned demonstration  HOME EXERCISE PROGRAM: Access Code: [email or web address] URL: [email or web address] Date: 07/24/2023 Prepared by: Laron Plummer  Exercises - Supine Quadricep Sets  - 1 x daily - 7 x weekly - 2 sets - 10 reps - 5 sec hold - Active Straight Leg Raise with Quad Set  - 1 x daily - 7 x weekly - 2 sets - 10 reps - Hooklying Clamshell with Resistance  - 1 x daily - 7 x weekly - 2-3 sets - 15 reps - green band hold - Beginner Bridge  - 1 x daily - 7 x weekly - 2 sets - 10 reps - Sit to Stand with Hands on Knees  - 1 x daily - 7 x weekly - 2 sets - 10 reps  ASSESSMENT:  CLINICAL IMPRESSION: 5/27/2025Mrs Devoto presents to PT following her initial evaluation. She reports doing her exercises and that she is feeling better, but her biggest complaint is her N/T into the L foot. Worked on STW along the piriformis followed with stretching and gross strengthening. She noted relief of N/T in the foot end of session compared to when she arrived.   Evaluation: Patient is a 68 y.o. F who was seen today for physical therapy  evaluation and treatment for chronic LE pain with occasional LBP. Physical findings are consistent with MD impression as pt demonstrates decrease in core and LE strength and functional mobility. LEFS score shows severe disability in performance of home ADLs and community activities. Pt would benefit from skilled PT services working on improving strength, activity tolerance, and mobility while decreasing pain.    OBJECTIVE IMPAIRMENTS: Abnormal gait, decreased activity tolerance, decreased endurance, decreased mobility, difficulty walking, decreased ROM, decreased strength, and pain  ACTIVITY LIMITATIONS: carrying, lifting, standing, squatting, stairs, transfers, and locomotion level  PARTICIPATION LIMITATIONS: meal prep, cleaning, driving, shopping, community activity, occupation, and yard work  PERSONAL FACTORS: Time since onset of injury/illness/exacerbation and 3+ comorbidities: L femur fx, HTN, CHF, breast cancer are also affecting patient's functional outcome.   REHAB POTENTIAL: Good  CLINICAL DECISION MAKING: Evolving/moderate complexity  EVALUATION COMPLEXITY: Moderate   GOALS: Goals reviewed with patient? No  SHORT TERM GOALS: Target date: 07/31/2023  Pt will be compliant and knowledgeable with initial HEP for improved comfort and carryover Baseline: initial HEP given Goal status: INITIAL  2.  Pt will self report LE pain no greater than 7/10 for improved comfort and functional ability Baseline: 10/10 at worst Goal status: INITIAL   LONG TERM GOALS: Target date: 09/04/2023   Pt will improve LEFS to no less than 40/80 as proxy for functional improvement with home ADLs and higher level community activity with home ADLs and community activities Baseline: 33/80 Goal status: INITIAL   2.  Pt will self report LE pain no greater than 3/10 for improved comfort and functional ability Baseline: 10/10 at worst Goal status: INITIAL   3.  Pt will increase 30 Second Sit to Stand rep  count to no less than 8 reps for improved balance, strength, and functional mobility Baseline: 4 reps with UE Goal status: INITIAL   4.  Pt will improve standing activity tolerance to no less than 20 minutes for improved comfort and functional ability with shopping and church activities  Baseline: 4-5 minute Goal status: INITIAL  5.  Pt will improve all LE MMT to no less than 4/5 for improved functional mobility and decrease pain Baseline:  Goal status: INITIAL  PLAN:  PT FREQUENCY: 2x/week  PT DURATION: 8 weeks  PLANNED INTERVENTIONS: 97164- PT Re-evaluation, 97110-Therapeutic exercises, 97530- Therapeutic activity, [id number]- Neuromuscular re-education, 97535- Self Care, 16109- Manual therapy, [id number]- Gait training, [id number]- Aquatic Therapy, [id number]- Electrical stimulation (unattended), [id number]- Electrical stimulation (manual), 97016- Vasopneumatic device, Cryotherapy, and Moist heat.  PLAN FOR NEXT SESSION: assess HEP response, strengthening in gym and aquatic environments, standing activity tolerances   Clydine Parkison PT, DPT, LAT, ATC  07/24/23  11:33 AM

## 2023-07-26 ENCOUNTER — Ambulatory Visit: Payer: 59

## 2023-07-26 ENCOUNTER — Telehealth: Payer: Self-pay

## 2023-07-26 ENCOUNTER — Ambulatory Visit: Admitting: Physical Therapy

## 2023-07-26 VITALS — Ht 63.0 in | Wt 260.0 lb

## 2023-07-26 DIAGNOSIS — Z5912 Inadequate housing utilities: Secondary | ICD-10-CM | POA: Diagnosis not present

## 2023-07-26 DIAGNOSIS — Z599 Problem related to housing and economic circumstances, unspecified: Secondary | ICD-10-CM | POA: Diagnosis not present

## 2023-07-26 DIAGNOSIS — Z5982 Transportation insecurity: Secondary | ICD-10-CM

## 2023-07-26 DIAGNOSIS — Z5941 Food insecurity: Secondary | ICD-10-CM

## 2023-07-26 DIAGNOSIS — Z5987 Material hardship due to limited financial resources, not elsewhere classified: Secondary | ICD-10-CM

## 2023-07-26 DIAGNOSIS — Z0000 Encounter for general adult medical examination without abnormal findings: Secondary | ICD-10-CM | POA: Diagnosis not present

## 2023-07-26 NOTE — Progress Notes (Addendum)
 Because this visit was a virtual/telehealth visit,  certain criteria was not obtained, such a blood pressure, CBG if applicable, and timed get up and go. Any medications not marked as "taking" were not mentioned during the medication reconciliation part of the visit. Any vitals not documented were not able to be obtained due to this being a telehealth visit or patient was unable to self-report a recent blood pressure reading due to a lack of equipment at home via telehealth. Vitals that have been documented are verbally provided by the patient.   Subjective:   Andrea [name] is a 68 y.o. who presents for a Medicare Wellness preventive visit.  As a reminder, Annual Wellness Visits don't include a physical exam, and some assessments may be limited, especially if this visit is performed virtually. We may recommend an in-person follow-up visit with your provider if needed.  Visit Complete: Virtual I connected with  Andrea [name] on 07/26/23 by a audio enabled telemedicine application and verified that I am speaking with the correct person using two identifiers.  Patient Location: Home  Provider Location: Office/Clinic  I discussed the limitations of evaluation and management by telemedicine. The patient expressed understanding and agreed to proceed.  Vital Signs: Because this visit was a virtual/telehealth visit, some criteria may be missing or patient reported. Any vitals not documented were not able to be obtained and vitals that have been documented are patient reported.  VideoDeclined- This patient declined Librarian, academic. Therefore the visit was completed with audio only.  Persons Participating in Visit: Patient.  AWV Questionnaire: No: Patient Medicare AWV questionnaire was not completed prior to this visit.  Cardiac Risk Factors include: advanced age (>92men, >27 women);dyslipidemia;family history of premature cardiovascular  disease;hypertension;obesity (BMI >30kg/m2);sedentary lifestyle     Objective:     Today's Vitals   07/26/23 1455  Weight: 260 lb (117.9 kg)  Height: 5\' 3"  (1.6 m)  PainSc: 0-No pain   Body mass index is 46.06 kg/m.     07/26/2023    3:02 PM 07/10/2023   12:02 PM 06/29/2023    9:12 AM 06/05/2023   11:32 AM 05/29/2023   10:12 AM 05/29/2023   10:09 AM 05/04/2023    8:33 AM  Advanced Directives  Does Patient Have a Medical Advance Directive? No No No No No No No  Would patient like information on creating a medical advance directive? No - Patient declined No - Patient declined No - Patient declined  No - Patient declined No - Patient declined No - Patient declined    Current Medications (verified) Outpatient Encounter Medications as of 07/26/2023  Medication Sig   albuterol  (VENTOLIN  HFA) 108 (90 Base) MCG/ACT inhaler Inhale 2 puffs into the lungs every 6 (six) hours as needed for wheezing or shortness of breath. (Patient not taking: Reported on 06/29/2023)   amLODipine  (NORVASC ) 5 MG tablet Take 1 tablet (5 mg total) by mouth at bedtime.   anastrozole  (ARIMIDEX ) 1 MG tablet Take 1 tablet (1 mg total) by mouth daily.   carvedilol  (COREG ) 6.25 MG tablet TAKE 1 TABLET BY MOUTH TWICE DAILY WITH A MEAL   famotidine  (PEPCID ) 20 MG tablet TAKE 1 TABLET BY MOUTH ONCE DAILY AS NEEDED FOR  HEARTBURN  OR  INDIGESTION   fenoprofen (NALFON) 600 MG TABS tablet Take 1,200 mg by mouth. (Patient not taking: Reported on 05/29/2023)   ferrous sulfate  325 (65 FE) MG tablet Take 1 tablet (325 mg total) by mouth daily with breakfast.  fluticasone -salmeterol (ADVAIR  HFA) 115-21 MCG/ACT inhaler Inhale 2 puffs into the lungs 2 (two) times daily. (Patient not taking: Reported on 06/29/2023)   gabapentin  (NEURONTIN ) 100 MG capsule Take 1 capsule (100 mg total) by mouth 2 (two) times daily.   levothyroxine  (SYNTHROID ) 125 MCG tablet TAKE 1 TABLET BY MOUTH ONCE DAILY BEFORE BREAKFAST   Melatonin 5 MG CHEW Chew 5 mg by  mouth at bedtime as needed. (Patient not taking: Reported on 06/29/2023)   Omega-3 Fatty Acids (FISH OIL ) 1000 MG CAPS Take 1 capsule (1,000 mg total) by mouth in the morning and at bedtime. (Patient not taking: Reported on 06/29/2023)   polyethylene glycol (MIRALAX  / GLYCOLAX ) 17 g packet Take 17 g by mouth 2 (two) times daily. (Patient not taking: Reported on 02/06/2023)   rosuvastatin  (CRESTOR ) 10 MG tablet Take 1 tablet (10 mg total) by mouth daily. (Patient not taking: Reported on 06/29/2023)   sertraline  (ZOLOFT ) 50 MG tablet Take 2 tablets by mouth once daily   Vitamin D , Cholecalciferol, 50 MCG (2000 UT) CAPS Take 2,000 Units by mouth daily.   No facility-administered encounter medications on file as of 07/26/2023.    Allergies (verified) Patient has no known allergies.   History: Past Medical History:  Diagnosis Date   Abnormal mammogram 08/28/2014   08/28/14 - Probable benign microcalcifications over the outer mid to lower left breast. Bi-Rads 3. F/U 6 months    Allergic rhinitis 06/22/2014   Arm pain, lateral, left 04/28/2016   Asthma    Breast cancer (HCC) 03/16/2022   Carpal tunnel syndrome 04/26/2006   Qualifier: Diagnosis of  By: Serina Dane MD, Aaron     Femur fracture [facility]) 05/14/2021   Femur fracture, right (HCC) 05/14/2021   First degree AV block 09/22/2013   EKG 09/22/13    GANGLION CYST, WRIST, RIGHT 10/06/2008   Qualifier: Diagnosis of  By: Redia Candela  MD, Taineisha     Hypothyroidism    Imbalance 01/12/2017   Palpitations 09/22/2013   Right wrist pain 04/28/2016   Past Surgical History:  Procedure Laterality Date   ABDOMINAL HYSTERECTOMY     fibroids   BREAST BIOPSY Left 01/24/2022   MM LT BREAST BX W LOC DEV 1ST LESION IMAGE BX SPEC STEREO GUIDE 01/24/2022 [facility]   BREAST BIOPSY  03/15/2022   MM LT RADIOACTIVE SEED LOC MAMMO GUIDE 03/15/2022 [facility]   BREAST LUMPECTOMY WITH RADIOACTIVE SEED LOCALIZATION Left 03/16/2022   Procedure: LEFT BREAST  LUMPECTOMY WITH RADIOACTIVE SEED LOCALIZATION;  Surgeon: Sim Dryer, MD;  Location: [facility];  Service: General;  Laterality: Left;   FEMUR IM NAIL Right 05/14/2021   Procedure: INTRAMEDULLARY (IM) RETROGRADE FEMORAL NAILING;  Surgeon: Virl Grimes, MD;  Location: [facility];  Service: Orthopedics;  Laterality: Right;   Family History  Problem Relation Age of Onset   Brain cancer Mother        cervial, pituitary, bone?   Hypertension Mother    Lung cancer Father    Alcohol abuse Father    Hypertension Sister    Hypothyroidism Sister    Hypertension Sister    Hypothyroidism Sister    Hypertension Sister    Hypothyroidism Sister    Hypertension Sister    Hypothyroidism Sister    Hypertension Sister    Hypothyroidism Sister    Prostate cancer Brother    Lung cancer Brother        lung   Ovarian cancer Maternal Aunt    Social History   Socioeconomic History  Marital status: Single    Spouse name: Not on file   Number of children: Not on file   Years of education: Not on file   Highest education level: Not on file  Occupational History   Not on file  Tobacco Use   Smoking status: Never   Smokeless tobacco: Never   Tobacco comments:    Tobacco smoke comes in through the house vent from outside  Vaping Use   Vaping status: Never Used  Substance and Sexual Activity   Alcohol use: No   Drug use: No   Sexual activity: Never    Birth control/protection: Surgical  Other Topics Concern   Not on file  Social History Narrative   Works for [employer], 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[employer], concessions   Social Drivers of Health   Financial Resource Strain: Medium Risk (07/26/2023)   Overall Financial Resource Strain (CARDIA)    Difficulty of Paying Living Expenses: Somewhat hard  Food Insecurity: Food Insecurity Present (07/26/2023)   Hunger Vital Sign    Worried About Running Out of Food in the Last Year: Sometimes true    Ran Out of  Food in the Last Year: Sometimes true  Transportation Needs: Unmet Transportation Needs (07/26/2023)   PRAPARE - Administrator, Civil Service (Medical): Yes    Lack of Transportation (Non-Medical): No  Physical Activity: Insufficiently Active (07/26/2023)   Exercise Vital Sign    Days of Exercise per Week: 1 day    Minutes of Exercise per Session: 60 min  Stress: No Stress Concern Present (07/26/2023)   [employer] of Occupational Health - Occupational Stress Questionnaire    Feeling of Stress : Not at all  Social Connections: Unknown (07/26/2023)   Social Connection and Isolation Panel [NHANES]    Frequency of Communication with Friends and Family: More than three times a week    Frequency of Social Gatherings with Friends and Family: Once a week    Attends Religious Services: More than 4 times per year    Active Member of [employer] or Organizations: Yes    Attends Engineer, structural: More than 4 times per year    Marital Status: Not on file    Tobacco Counseling Counseling given: Not Answered Tobacco comments: Tobacco smoke comes in through the house vent from outside    Clinical Intake:  Pre-visit preparation completed: Yes  Pain : No/denies pain Pain Score: 0-No pain     BMI - recorded: 46.06 Nutritional Status: BMI > 30  Obese Nutritional Risks: None Diabetes: No  Lab Results  Component Value Date   HGBA1C 6.0 07/04/2022   HGBA1C 5.9 (A) 03/25/2021   HGBA1C 5.6 12/23/2012     How often do you need to have someone help you when you read instructions, pamphlets, or other written materials from your doctor or pharmacy?: 1 - Never  Interpreter Needed?: No  Information entered by :: Tarquin Welcher N. Aashi Derrington, LPN.   Activities of Daily Living     07/26/2023    3:12 PM  In your present state of health, do you have any difficulty performing the following activities:  Hearing? 0  Vision? 0  Difficulty concentrating or making decisions? 0   Walking or climbing stairs? 0  Dressing or bathing? 0  Doing errands, shopping? 0  Preparing Food and eating ? N  Using the Toilet? N  In the past six months, have you accidently leaked urine? N  Do you have  problems with loss of bowel control? N  Managing your Medications? N  Managing your Finances? N  Housekeeping or managing your Housekeeping? N    Patient Care Team: Arn Lane, MD as PCP - General (Family Medicine) Sim Dryer, MD as Consulting Physician (General Surgery) Iruku, Praveena, MD as Consulting Physician (Hematology and Oncology) Johna Myers, MD as Consulting Physician (Radiation Oncology)  Indicate any recent Medical Services you may have received from other than [facility] providers in the past year (date may be approximate).     Assessment:    This is a routine wellness examination for Andrea [name].  Hearing/Vision screen Hearing Screening - Comments:: Denies hearing difficulties.  Vision Screening - Comments:: Wears rx glasses - not up to date with routine eye exams.    Goals Addressed             This Visit's Progress    To be pain free.         Depression Screen     07/26/2023    3:02 PM 06/29/2023    9:11 AM 05/29/2023   10:22 AM 05/29/2023   10:09 AM 05/04/2023    8:34 AM 04/17/2023    8:40 AM 02/06/2023    9:54 AM  PHQ 2/9 Scores  PHQ - 2 Score 0  2 2 2 5  0  PHQ- 9 Score 1  11 12 10 13 2   Exception Documentation  Patient refusal         Fall Risk     07/26/2023    3:01 PM 06/29/2023    9:11 AM 05/29/2023   10:08 AM 05/04/2023    8:33 AM 04/17/2023    8:40 AM  Fall Risk   Falls in the past year? 0 0 0 0 0  Number falls in past yr: 0 0 0 0 0  Injury with Fall? 0 0 0 0 0  Risk for fall due to : No Fall Risks      Follow up Falls evaluation completed        MEDICARE RISK AT HOME:  Medicare Risk at Home Any stairs in or around the home?: Yes (3-4 FRONT AND BACK ENTRY) If so, are there any without handrails?: No Home free of loose  throw rugs in walkways, pet beds, electrical cords, etc?: Yes Adequate lighting in your home to reduce risk of falls?: Yes Life alert?: No Use of a cane, walker or w/c?: No Grab bars in the bathroom?: No Shower chair or bench in shower?: No (SHOWER CHAIR IS BROKEN) Elevated toilet seat or a handicapped toilet?: No  TIMED UP AND GO:  Was the test performed?  No  Cognitive Function: Declined/Normal: No cognitive concerns noted by patient or family. Patient alert, oriented, able to answer questions appropriately and recall recent events. No signs of memory loss or confusion.    07/26/2023    3:02 PM  MMSE - Mini Mental State Exam  Not completed: Unable to complete        07/26/2023    3:16 PM 03/31/2022   11:24 AM  6CIT Screen  What Year? 0 points 0 points  What month? 0 points 0 points  What time? 0 points 0 points  Count back from 20 0 points 0 points  Months in reverse 0 points 0 points  Repeat phrase 0 points 0 points  Total Score 0 points 0 points    Immunizations Immunization History  Administered Date(s) Administered   DTP 05/30/2006   Fluad Trivalent(High Dose 65+)  04/17/2023   Influenza Split 02/03/2011   Influenza,inj,Quad PF,6+ Mos 12/23/2012, 04/06/2014, 01/07/2016, 01/12/2017, 04/08/2019, 10/28/2019, 03/25/2021   PFIZER Comirnaty(Gray Top)Covid-19 Tri-Sucrose Vaccine 08/03/2020   PFIZER(Purple Top)SARS-COV-2 Vaccination 10/28/2019, 11/25/2019   PNEUMOCOCCAL CONJUGATE-20 08/03/2020   Pfizer Covid-19 Vaccine Bivalent Booster 40yrs & up 03/25/2021   Pfizer(Comirnaty)Fall Seasonal Vaccine 12 years and older 04/17/2023   Td 11/27/1992, 05/30/2006   Tdap 07/13/2016   Zoster Recombinant(Shingrix) 08/18/2020, 08/30/2021   Zoster, Unspecified 04/03/2022    Screening Tests Health Maintenance  Topic Date Due   Fecal DNA (Cologuard)  02/03/2023   INFLUENZA VACCINE  09/28/2023   COVID-19 Vaccine (6 - Pfizer risk 2024-25 season) 10/15/2023   Medicare Annual Wellness  (AWV)  07/25/2024   MAMMOGRAM  01/07/2025   DTaP/Tdap/Td (5 - Td or Tdap) 07/14/2026   Pneumonia Vaccine 10+ Years old  Completed   DEXA SCAN  Completed   Hepatitis C Screening  Completed   Zoster Vaccines- Shingrix  Completed   HPV VACCINES  Aged Out   Meningococcal B Vaccine  Aged Out   Colonoscopy  Discontinued    Health Maintenance  Health Maintenance Due  Topic Date Due   Fecal DNA (Cologuard)  02/03/2023   Health Maintenance Items Addressed: Yes Patient is due for Cologuard testing.  Additional Screening:  Vision Screening: Recommended annual ophthalmology exams for early detection of glaucoma and other disorders of the eye.  Dental Screening: Recommended annual dental exams for proper oral hygiene  Community Resource Referral / Chronic Care Management: CRR required this visit?  No   CCM required this visit?  No   Plan:    I have personally reviewed and noted the following in the patient's chart:   Medical and social history Use of alcohol, tobacco or illicit drugs  Current medications and supplements including opioid prescriptions. Patient is not currently taking opioid prescriptions. Functional ability and status Nutritional status Physical activity Advanced directives List of other physicians Hospitalizations, surgeries, and ER visits in previous 12 months Vitals Screenings to include cognitive, depression, and falls Referrals and appointments  In addition, I have reviewed and discussed with patient certain preventive protocols, quality metrics, and best practice recommendations. A written personalized care plan for preventive services as well as general preventive health recommendations were provided to patient.   Andrea Sheldon, LPN   [id number]/36/6440   After Visit Summary: (MyChart) Due to this being a telephonic visit, the after visit summary with patients personalized plan was offered to patient via MyChart   Notes: Patient aware of current care  gaps.  Immunization record was verified by [employer].  Patient is due for Cologuard testing.  Patient has some insecurities with getting food, paying utilities, transportation and rent.  Will send referral for assistance.

## 2023-07-26 NOTE — Patient Instructions (Signed)
 Ms. Tegeler , Thank you for taking time out of your busy schedule to complete your Annual Wellness Visit with me. I enjoyed our conversation and look forward to speaking with you again next year. I, as well as your care team,  appreciate your ongoing commitment to your health goals. Please review the following plan we discussed and let me know if I can assist you in the future. Your Game plan/ To Do List    Referrals: If you haven't heard from the office you've been referred to, please reach out to them at the phone provided.   Follow up Visits: Next Medicare AWV with our clinical staff: 07/28/2024 at 3:40 pm phone visit with Nurse Health Advisor   Have you seen your provider in the last 6 months (3 months if uncontrolled diabetes)? Yes Next Office Visit with your provider: 08/14/2023 at 10:30 am office visit with Dr. Grandville Lax  Clinician Recommendations:  Aim for 30 minutes of exercise or brisk walking, 6-8 glasses of water, and 5 servings of fruits and vegetables each day.       This is a list of the screening recommended for you and due dates:  Health Maintenance  Topic Date Due   Cologuard (Stool DNA test)  02/03/2023   Flu Shot  09/28/2023   COVID-19 Vaccine (6 - Pfizer risk 2024-25 season) 10/15/2023   Medicare Annual Wellness Visit  07/25/2024   Mammogram  01/07/2025   DTaP/Tdap/Td vaccine (5 - Td or Tdap) 07/14/2026   Pneumonia Vaccine  Completed   DEXA scan (bone density measurement)  Completed   Hepatitis C Screening  Completed   Zoster (Shingles) Vaccine  Completed   HPV Vaccine  Aged Out   Meningitis B Vaccine  Aged Out   Colon Cancer Screening  Discontinued    Advanced directives: Patient has paperwork. Advance Care Planning is important because it:  [x]  Makes sure you receive the medical care that is consistent with your values, goals, and preferences  [x]  It provides guidance to your family and loved ones and reduces their decisional burden about whether or not they are  making the right decisions based on your wishes.  Follow the link provided in your after visit summary or read over the paperwork we have mailed to you to help you started getting your Advance Directives in place. If you need assistance in completing these, please reach out to us  so that we can help you!  See attachments for Preventive Care and Fall Prevention Tips.

## 2023-07-26 NOTE — Telephone Encounter (Signed)
 Informed patient of note left by provider. Patient understood. I also see that patient already has a follow up appt. Set for 08/14/23 at 10:30. .Francine Hannan, CMA

## 2023-07-26 NOTE — Telephone Encounter (Signed)
 Patient is requesting a refill on her Tramadol . Patient uses [facility] on [address].  Andrea [name] N. Glenna Lango, LPN [facility] Annual Wellness Team Direct Dial: [phone]

## 2023-07-31 ENCOUNTER — Ambulatory Visit: Attending: Family Medicine | Admitting: Physical Therapy

## 2023-07-31 ENCOUNTER — Encounter: Payer: Self-pay | Admitting: Physical Therapy

## 2023-07-31 DIAGNOSIS — M6281 Muscle weakness (generalized): Secondary | ICD-10-CM | POA: Diagnosis not present

## 2023-07-31 DIAGNOSIS — M79604 Pain in right leg: Secondary | ICD-10-CM | POA: Diagnosis not present

## 2023-07-31 DIAGNOSIS — M79605 Pain in left leg: Secondary | ICD-10-CM | POA: Diagnosis not present

## 2023-07-31 DIAGNOSIS — R2689 Other abnormalities of gait and mobility: Secondary | ICD-10-CM | POA: Diagnosis not present

## 2023-07-31 NOTE — Therapy (Signed)
 OUTPATIENT PHYSICAL THERAPY TREATMENT   Patient Name: Andrea [name] MRN: [id number] DOB:1955-11-09, 68 y.o., female Today's Date: 07/31/2023  END OF SESSION:  PT End of Session - 07/31/23 1107     Visit Number 3    Number of Visits 17    Date for PT Re-Evaluation 09/04/23    Authorization Type [employer] Dual Complete    PT Start Time 1105    PT Stop Time 1152    PT Time Calculation (min) 47 min    Activity Tolerance Patient tolerated treatment well    Behavior During Therapy WFL for tasks assessed/performed               Past Medical History:  Diagnosis Date   Abnormal mammogram 08/28/2014   08/28/14 - Probable benign microcalcifications over the outer mid to lower left breast. Bi-Rads 3. F/U 6 months    Allergic rhinitis 06/22/2014   Arm pain, lateral, left 04/28/2016   Asthma    Breast cancer (HCC) 03/16/2022   Carpal tunnel syndrome 04/26/2006   Qualifier: Diagnosis of  By: Serina Dane MD, Aaron     Femur fracture [facility]) 05/14/2021   Femur fracture, right (HCC) 05/14/2021   First degree AV block 09/22/2013   EKG 09/22/13    GANGLION CYST, WRIST, RIGHT 10/06/2008   Qualifier: Diagnosis of  By: Redia Candela  MD, Taineisha     Hypothyroidism    Imbalance 01/12/2017   Palpitations 09/22/2013   Right wrist pain 04/28/2016   Past Surgical History:  Procedure Laterality Date   ABDOMINAL HYSTERECTOMY     fibroids   BREAST BIOPSY Left 01/24/2022   MM LT BREAST BX W LOC DEV 1ST LESION IMAGE BX SPEC STEREO GUIDE 01/24/2022 [facility]   BREAST BIOPSY  03/15/2022   MM LT RADIOACTIVE SEED LOC MAMMO GUIDE 03/15/2022 [facility]   BREAST LUMPECTOMY WITH RADIOACTIVE SEED LOCALIZATION Left 03/16/2022   Procedure: LEFT BREAST LUMPECTOMY WITH RADIOACTIVE SEED LOCALIZATION;  Surgeon: Sim Dryer, MD;  Location: [facility];  Service: General;  Laterality: Left;   FEMUR IM NAIL Right 05/14/2021   Procedure: INTRAMEDULLARY (IM) RETROGRADE FEMORAL NAILING;   Surgeon: Virl Grimes, MD;  Location: [facility];  Service: Orthopedics;  Laterality: Right;   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   Normocytic anemia 07/12/2021   History of femur fracture 07/11/2021   Seasonal allergies 04/08/2021   Restrictive lung disease 03/25/2021   Prediabetes 03/25/2021   Thoracic aortic ectasia (HCC) 09/07/2020   Congestive heart failure (HCC) 08/03/2020   Esophageal reflux 08/14/2017   Vitamin D  deficiency 01/12/2017   Cataract 09/19/2013   Hypothyroidism 04/26/2006   Hyperlipidemia 04/26/2006   BMI 45.0-49.9, adult (HCC) 04/26/2006   Major depressive disorder, recurrent episode (HCC) 04/26/2006   HYPERTENSION, BENIGN SYSTEMIC 04/26/2006    PCP: Arn Lane, MD  REFERRING PROVIDER: Arn Lane, MD  REFERRING DIAG:  M54.16 (ICD-10-CM) - Lumbar radiculopathy M25.551 (ICD-10-CM) - Right hip pain  Rationale for Evaluation and Treatment: Rehabilitation  THERAPY DIAG:  Pain in right leg  Pain in left leg  Muscle weakness (generalized)  Other abnormalities of gait and mobility  ONSET DATE: Chronic  SUBJECTIVE:  SUBJECTIVE STATEMENT: 07/31/2023 " I am alright today. I wake up feeling alittle sore in the knees/ ankle."   PERTINENT HISTORY:  L femur fx, HTN, CHF, breast cancer  PAIN:  Are you having pain?  Yes: NPRS scale: 1-2/10 Worst: 10/10 Pain location: bilateral LE (posterior/lateral hips) Pain description: sharp, numb Aggravating factors: walking,  standing Relieving factors: medication   PRECAUTIONS: None  RED FLAGS: None   WEIGHT BEARING RESTRICTIONS: No  FALLS:  Has patient fallen in last 6 months? No  LIVING ENVIRONMENT: Lives with: lives with their family Lives in: House/apartment Stairs: Yes: External: 4 steps; on right going up Has following equipment at home: None  OCCUPATION: Retired Designer, industrial/product  PLOF: Independent  PATIENT GOALS: improve standing tolerance, decrease pain  NEXT MD VISIT: 08/14/2023  OBJECTIVE:  Note: Objective measures were completed at Evaluation unless otherwise noted.  DIAGNOSTIC FINDINGS:  See imaging for recent lumbar MRI  PATIENT SURVEYS:   LEFS: 33/80  COGNITION: Overall cognitive status: Within functional limits for tasks assessed     SENSATION: Light touch: Impaired - anterior shin R  MUSCLE LENGTH: Vanvoorhis test: Right (+); Left (+)  POSTURE: rounded shoulders, forward head, increased lumbar lordosis, and large body habitus   PALPATION: TTP to bilateral posterior/lateral hip  LOWER EXTREMITY ROM:     Active  Right eval Left eval  Hip flexion    Hip extension    Hip abduction    Hip adduction    Hip internal rotation    Hip external rotation    Knee flexion [facility] WFL  Knee extension    Ankle dorsiflexion    Ankle plantarflexion    Ankle inversion    Ankle eversion     (Blank rows = not tested)  LOWER EXTREMITY MMT:    MMT Right eval Left eval  Hip flexion 4 3+  Hip extension    Hip abduction 3+ 3+  Hip adduction    Hip internal rotation    Hip external rotation    Knee flexion 4 4  Knee extension 4 4  Ankle dorsiflexion    Ankle plantarflexion    Ankle inversion    Ankle eversion     (Blank rows = not tested)  LUMBAR SPECIAL TESTS:  Straight leg raise test: Negative and Slump test: Negative  FUNCTIONAL TESTS:  30 Second Sit to Stand: 4 reps with UE  GAIT: Distance walked: 31ft Assistive device utilized: None Level of assistance:  SBA Comments: flexed trunk, decreased gait speed  TREATMENT: [facility] Adult PT Treatment:                                                DATE: 07/31/23 Nu-step L5 x 5 min UE/LE Toe taps on cone one side only 2 x 10 with 1 HHA performed bil Standing hip abduction 2 x 10 with hands on table  Sit to stand from hi-lo table with table raised each set 2 x 5.  Ice pack x 6 min for L knee in supine  [facility] Adult PT Treatment:                                                DATE: 07/24/23 MTPR along the L piriformis  using tennis ball Seated piriformis stretch LLE 2 x 30 sec Bridge 2 x 10  Sustained hip abduction 2 x 10 with Blue theraband UE ball press 2 x 10 holding 2 sec ea. Log rolling supine> sit position Sit to stand 2 x 5 and reviewed mechanics.  Nu-step l5 x 5 min UE/LE Updated HEP for sit to stand  [facility] Adult PT Treatment:                                                DATE: 07/10/2023 Therapeutic Exercise: Supine QS x 5 - 5" hold SLR x 5 each Hooklying clamshell x 15 GTB Bridge (small range) x 5  PATIENT EDUCATION:  Education details: eval findings, ODI, HEP, POC Person educated: Patient Education method: Explanation, Demonstration, and Handouts Education comprehension: verbalized understanding and returned demonstration  HOME EXERCISE PROGRAM: Access Code: [email or web address] URL: [email or web address] Date: 07/24/2023 Prepared by: Laron Plummer  Exercises - Supine Quadricep Sets  - 1 x daily - 7 x weekly - 2 sets - 10 reps - 5 sec hold - Active Straight Leg Raise with Quad Set  - 1 x daily - 7 x weekly - 2 sets - 10 reps - Hooklying Clamshell with Resistance  - 1 x daily - 7 x weekly - 2-3 sets - 15 reps - green band hold - Beginner Bridge  - 1 x daily - 7 x weekly - 2 sets - 10 reps - Sit to Stand with Hands on Knees  - 1 x daily - 7 x weekly - 2 sets - 10 reps  ASSESSMENT:  CLINICAL IMPRESSION: 6/3/2025Mrs Wainright presents to physical therapy noting 1-2/10 pain today  in the knees/ ankles bil. Continued working on bil hip strength beginning in standing which she was able to complete all exercises but does compensate requiring verbal cues to avoid and additionally fatigues quickly. Pt requested ice end of session to reduce knee aggravation today.   Evaluation: Patient is a 68 y.o. F who was seen today for physical therapy evaluation and treatment for chronic LE pain with occasional LBP. Physical findings are consistent with MD impression as pt demonstrates decrease in core and LE strength and functional mobility. LEFS score shows severe disability in performance of home ADLs and community activities. Pt would benefit from skilled PT services working on improving strength, activity tolerance, and mobility while decreasing pain.    OBJECTIVE IMPAIRMENTS: Abnormal gait, decreased activity tolerance, decreased endurance, decreased mobility, difficulty walking, decreased ROM, decreased strength, and pain  ACTIVITY LIMITATIONS: carrying, lifting, standing, squatting, stairs, transfers, and locomotion level  PARTICIPATION LIMITATIONS: meal prep, cleaning, driving, shopping, community activity, occupation, and yard work  PERSONAL FACTORS: Time since onset of injury/illness/exacerbation and 3+ comorbidities: L femur fx, HTN, CHF, breast cancer are also affecting patient's functional outcome.   REHAB POTENTIAL: Good  CLINICAL DECISION MAKING: Evolving/moderate complexity  EVALUATION COMPLEXITY: Moderate   GOALS: Goals reviewed with patient? No  SHORT TERM GOALS: Target date: 07/31/2023  Pt will be compliant and knowledgeable with initial HEP for improved comfort and carryover Baseline: initial HEP given Goal status: MET 07/31/23  2.  Pt will self report LE pain no greater than 7/10 for improved comfort and functional ability Baseline: 10/10 at worst Goal status: Partially met 07/31/23   LONG TERM GOALS: Target date: 09/04/2023   Pt will improve LEFS to no  less  than 40/80 as proxy for functional improvement with home ADLs and higher level community activity with home ADLs and community activities Baseline: 33/80 Goal status: INITIAL   2.  Pt will self report LE pain no greater than 3/10 for improved comfort and functional ability Baseline: 10/10 at worst Goal status: INITIAL   3.  Pt will increase 30 Second Sit to Stand rep count to no less than 8 reps for improved balance, strength, and functional mobility Baseline: 4 reps with UE Goal status: INITIAL   4.  Pt will improve standing activity tolerance to no less than 20 minutes for improved comfort and functional ability with shopping and church activities Baseline: 4-5 minute Goal status: INITIAL  5.  Pt will improve all LE MMT to no less than 4/5 for improved functional mobility and decrease pain Baseline:  Goal status: INITIAL  PLAN:  PT FREQUENCY: 2x/week  PT DURATION: 8 weeks  PLANNED INTERVENTIONS: 97164- PT Re-evaluation, 97110-Therapeutic exercises, 97530- Therapeutic activity, [id number]- Neuromuscular re-education, 97535- Self Care, 44010- Manual therapy, [id number]- Gait training, [id number]- Aquatic Therapy, [id number]- Electrical stimulation (unattended), [id number]- Electrical stimulation (manual), 97016- Vasopneumatic device, Cryotherapy, and Moist heat.  PLAN FOR NEXT SESSION: assess HEP response, strengthening in gym and aquatic environments, standing activity tolerances   Taylar Hartsough PT, DPT, LAT, ATC  07/31/23  11:59 AM

## 2023-07-31 NOTE — Patient Instructions (Signed)

## 2023-08-01 ENCOUNTER — Telehealth: Payer: Self-pay | Admitting: Gastroenterology

## 2023-08-01 NOTE — Telephone Encounter (Signed)
 PT is scheduled for a colonoscopy on 6/23 at [facility] and she needs to reschedule it. Please provide new date.

## 2023-08-02 ENCOUNTER — Ambulatory Visit: Admitting: Physical Therapy

## 2023-08-02 ENCOUNTER — Encounter

## 2023-08-02 ENCOUNTER — Telehealth: Payer: Self-pay | Admitting: Physical Therapy

## 2023-08-02 NOTE — Telephone Encounter (Signed)
 Patient rescheduled for 10/04/23. Patient's pre visit was also rescheduled for 09/25/23

## 2023-08-02 NOTE — Telephone Encounter (Signed)
Called and informed patient of missed visit and provided reminder of next appt and attendance policy.  

## 2023-08-02 NOTE — Therapy (Deleted)
 OUTPATIENT PHYSICAL THERAPY TREATMENT   Patient Name: Andrea [name] MRN: [id number] DOB:10-Oct-1955, 68 y.o., female Today's Date: 08/02/2023  END OF SESSION:      Past Medical History:  Diagnosis Date   Abnormal mammogram 08/28/2014   08/28/14 - Probable benign microcalcifications over the outer mid to lower left breast. Bi-Rads 3. F/U 6 months    Allergic rhinitis 06/22/2014   Arm pain, lateral, left 04/28/2016   Asthma    Breast cancer (HCC) 03/16/2022   Carpal tunnel syndrome 04/26/2006   Qualifier: Diagnosis of  By: Serina Dane MD, Aaron     Femur fracture [facility]) 05/14/2021   Femur fracture, right (HCC) 05/14/2021   First degree AV block 09/22/2013   EKG 09/22/13    GANGLION CYST, WRIST, RIGHT 10/06/2008   Qualifier: Diagnosis of  By: Redia Candela  MD, Taineisha     Hypothyroidism    Imbalance 01/12/2017   Palpitations 09/22/2013   Right wrist pain 04/28/2016   Past Surgical History:  Procedure Laterality Date   ABDOMINAL HYSTERECTOMY     fibroids   BREAST BIOPSY Left 01/24/2022   MM LT BREAST BX W LOC DEV 1ST LESION IMAGE BX SPEC STEREO GUIDE 01/24/2022 [facility]   BREAST BIOPSY  03/15/2022   MM LT RADIOACTIVE SEED LOC MAMMO GUIDE 03/15/2022 [facility]   BREAST LUMPECTOMY WITH RADIOACTIVE SEED LOCALIZATION Left 03/16/2022   Procedure: LEFT BREAST LUMPECTOMY WITH RADIOACTIVE SEED LOCALIZATION;  Surgeon: Sim Dryer, MD;  Location: [facility];  Service: General;  Laterality: Left;   FEMUR IM NAIL Right 05/14/2021   Procedure: INTRAMEDULLARY (IM) RETROGRADE FEMORAL NAILING;  Surgeon: Virl Grimes, MD;  Location: [facility];  Service: Orthopedics;  Laterality: Right;   Patient Active Problem List   Diagnosis Date Noted   Lumbar radiculopathy 06/29/2023   CKD stage 3a, GFR 45-59 ml/min (HCC) 06/29/2023   Elevated liver enzymes 05/04/2023   Hip pain 04/17/2023   Insomnia 02/06/2023   Genetic testing 06/19/2022   Skin desquamation 06/14/2022    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   Normocytic anemia 07/12/2021   History of femur fracture 07/11/2021   Seasonal allergies 04/08/2021   Restrictive lung disease 03/25/2021   Prediabetes 03/25/2021   Thoracic aortic ectasia (HCC) 09/07/2020   Congestive heart failure (HCC) 08/03/2020   Esophageal reflux 08/14/2017   Vitamin D  deficiency 01/12/2017   Cataract 09/19/2013   Hypothyroidism 04/26/2006   Hyperlipidemia 04/26/2006   BMI 45.0-49.9, adult (HCC) 04/26/2006   Major depressive disorder, recurrent episode (HCC) 04/26/2006   HYPERTENSION, BENIGN SYSTEMIC 04/26/2006    PCP: Arn Lane, MD  REFERRING PROVIDER: Arn Lane, MD  REFERRING DIAG:  M54.16 (ICD-10-CM) - Lumbar radiculopathy M25.551 (ICD-10-CM) - Right hip pain  Rationale for Evaluation and Treatment: Rehabilitation  THERAPY DIAG:  No diagnosis found.  ONSET DATE: Chronic  SUBJECTIVE:  SUBJECTIVE STATEMENT: 08/02/2023 " I am alright today. I wake up feeling alittle sore in the knees/ ankle."   PERTINENT HISTORY:  L femur fx, HTN, CHF, breast cancer  PAIN:  Are you having pain?  Yes: NPRS scale: 1-2/10 Worst: 10/10 Pain location: bilateral LE (posterior/lateral hips) Pain description: sharp, numb Aggravating factors: walking, standing Relieving factors: medication   PRECAUTIONS: None  RED FLAGS: None   WEIGHT BEARING RESTRICTIONS: No  FALLS:  Has patient fallen in last 6 months? No  LIVING ENVIRONMENT: Lives with: lives with their family Lives in: House/apartment Stairs: Yes: External: 4 steps; on right going up Has following equipment at home: None  OCCUPATION: Retired Designer, industrial/product  PLOF: Independent  PATIENT GOALS: improve standing tolerance, decrease pain  NEXT MD  VISIT: 08/14/2023  OBJECTIVE:  Note: Objective measures were completed at Evaluation unless otherwise noted.  DIAGNOSTIC FINDINGS:  See imaging for recent lumbar MRI  PATIENT SURVEYS:   LEFS: 33/80  COGNITION: Overall cognitive status: Within functional limits for tasks assessed     SENSATION: Light touch: Impaired - anterior shin R  MUSCLE LENGTH: Gibb test: Right (+); Left (+)  POSTURE: rounded shoulders, forward head, increased lumbar lordosis, and large body habitus   PALPATION: TTP to bilateral posterior/lateral hip  LOWER EXTREMITY ROM:     Active  Right eval Left eval  Hip flexion    Hip extension    Hip abduction    Hip adduction    Hip internal rotation    Hip external rotation    Knee flexion [facility] WFL  Knee extension    Ankle dorsiflexion    Ankle plantarflexion    Ankle inversion    Ankle eversion     (Blank rows = not tested)  LOWER EXTREMITY MMT:    MMT Right eval Left eval  Hip flexion 4 3+  Hip extension    Hip abduction 3+ 3+  Hip adduction    Hip internal rotation    Hip external rotation    Knee flexion 4 4  Knee extension 4 4  Ankle dorsiflexion    Ankle plantarflexion    Ankle inversion    Ankle eversion     (Blank rows = not tested)  LUMBAR SPECIAL TESTS:  Straight leg raise test: Negative and Slump test: Negative  FUNCTIONAL TESTS:  30 Second Sit to Stand: 4 reps with UE  GAIT: Distance walked: 50ft Assistive device utilized: None Level of assistance: SBA Comments: flexed trunk, decreased gait speed  TREATMENT:  TREATMENT 08/02/23:  Aquatic therapy at [facility]- [address] - therapeutic pool temp 92 degrees Pt enters building independently.  Treatment took place in water 3.8 to  4 ft 8 in.feet deep depending upon activity.  Pt entered and exited the pool via stair and handrails    Aquatic Therapy:  Water walking for warm up fwd/lat/bkwds    Pt requires the buoyancy of water for active assisted  exercises with buoyancy supported for strengthening and AROM exercises. Hydrostatic pressure also supports joints by unweighting joint load by at least 50 % in 3-4 feet depth water. 80% in chest to neck deep water. Water will provide assistance with movement using the current and laminar flow while the buoyancy reduces weight bearing. Pt requires the viscosity of the water for resistance with strengthening exercises.   [facility] Adult PT Treatment:  DATE: 07/31/23 Nu-step L5 x 5 min UE/LE Toe taps on cone one side only 2 x 10 with 1 HHA performed bil Standing hip abduction 2 x 10 with hands on table  Sit to stand from hi-lo table with table raised each set 2 x 5.  Ice pack x 6 min for L knee in supine  [facility] Adult PT Treatment:                                                DATE: 07/24/23 MTPR along the L piriformis using tennis ball Seated piriformis stretch LLE 2 x 30 sec Bridge 2 x 10  Sustained hip abduction 2 x 10 with Blue theraband UE ball press 2 x 10 holding 2 sec ea. Log rolling supine> sit position Sit to stand 2 x 5 and reviewed mechanics.  Nu-step l5 x 5 min UE/LE Updated HEP for sit to stand  [facility] Adult PT Treatment:                                                DATE: 07/10/2023 Therapeutic Exercise: Supine QS x 5 - 5" hold SLR x 5 each Hooklying clamshell x 15 GTB Bridge (small range) x 5  PATIENT EDUCATION:  Education details: eval findings, ODI, HEP, POC Person educated: Patient Education method: Explanation, Demonstration, and Handouts Education comprehension: verbalized understanding and returned demonstration  HOME EXERCISE PROGRAM: Access Code: [email or web address] URL: [email or web address] Date: 07/24/2023 Prepared by: Laron Plummer  Exercises - Supine Quadricep Sets  - 1 x daily - 7 x weekly - 2 sets - 10 reps - 5 sec hold - Active Straight Leg Raise with Quad Set  - 1 x daily - 7 x weekly - 2 sets - 10  reps - Hooklying Clamshell with Resistance  - 1 x daily - 7 x weekly - 2-3 sets - 15 reps - green band hold - Beginner Bridge  - 1 x daily - 7 x weekly - 2 sets - 10 reps - Sit to Stand with Hands on Knees  - 1 x daily - 7 x weekly - 2 sets - 10 reps  ASSESSMENT:  CLINICAL IMPRESSION: 08/02/2023: Session today focused on *** in the aquatic environment for use of buoyancy to offload joints and the viscosity of water as resistance during therapeutic exercise.  ***.  Patient was able to tolerate all prescribed exercises in the aquatic environment with no adverse effects and reports ***/10 pain at the end of the session. Patient continues to benefit from skilled PT services on land and aquatic based and should be progressed as able to improve functional independence.   Evaluation: Patient is a 68 y.o. F who was seen today for physical therapy evaluation and treatment for chronic LE pain with occasional LBP. Physical findings are consistent with MD impression as pt demonstrates decrease in core and LE strength and functional mobility. LEFS score shows severe disability in performance of home ADLs and community activities. Pt would benefit from skilled PT services working on improving strength, activity tolerance, and mobility while decreasing pain.    OBJECTIVE IMPAIRMENTS: Abnormal gait, decreased activity tolerance, decreased endurance, decreased mobility, difficulty walking, decreased ROM, decreased strength, and pain  ACTIVITY LIMITATIONS: carrying, lifting,  standing, squatting, stairs, transfers, and locomotion level  PARTICIPATION LIMITATIONS: meal prep, cleaning, driving, shopping, community activity, occupation, and yard work  PERSONAL FACTORS: Time since onset of injury/illness/exacerbation and 3+ comorbidities: L femur fx, HTN, CHF, breast cancer are also affecting patient's functional outcome.   REHAB POTENTIAL: Good  CLINICAL DECISION MAKING: Evolving/moderate complexity  EVALUATION  COMPLEXITY: Moderate   GOALS: Goals reviewed with patient? No  SHORT TERM GOALS: Target date: 07/31/2023  Pt will be compliant and knowledgeable with initial HEP for improved comfort and carryover Baseline: initial HEP given Goal status: MET 07/31/23  2.  Pt will self report LE pain no greater than 7/10 for improved comfort and functional ability Baseline: 10/10 at worst Goal status: Partially met 07/31/23   LONG TERM GOALS: Target date: 09/04/2023   Pt will improve LEFS to no less than 40/80 as proxy for functional improvement with home ADLs and higher level community activity with home ADLs and community activities Baseline: 33/80 Goal status: INITIAL   2.  Pt will self report LE pain no greater than 3/10 for improved comfort and functional ability Baseline: 10/10 at worst Goal status: INITIAL   3.  Pt will increase 30 Second Sit to Stand rep count to no less than 8 reps for improved balance, strength, and functional mobility Baseline: 4 reps with UE Goal status: INITIAL   4.  Pt will improve standing activity tolerance to no less than 20 minutes for improved comfort and functional ability with shopping and church activities Baseline: 4-5 minute Goal status: INITIAL  5.  Pt will improve all LE MMT to no less than 4/5 for improved functional mobility and decrease pain Baseline:  Goal status: INITIAL  PLAN:  PT FREQUENCY: 2x/week  PT DURATION: 8 weeks  PLANNED INTERVENTIONS: 97164- PT Re-evaluation, 97110-Therapeutic exercises, 97530- Therapeutic activity, [id number]- Neuromuscular re-education, 97535- Self Care, 16109- Manual therapy, [id number]- Gait training, [id number]- Aquatic Therapy, U0454- Electrical stimulation (unattended), [id number]- Electrical stimulation (manual), 97016- Vasopneumatic device, Cryotherapy, and Moist heat.  PLAN FOR NEXT SESSION: assess HEP response, strengthening in gym and aquatic environments, standing activity tolerances   Donovan Gallant Frayda Egley PT 08/02/23  1:21  PM

## 2023-08-07 ENCOUNTER — Telehealth: Payer: Self-pay | Admitting: *Deleted

## 2023-08-07 ENCOUNTER — Encounter

## 2023-08-07 ENCOUNTER — Ambulatory Visit

## 2023-08-07 DIAGNOSIS — M6281 Muscle weakness (generalized): Secondary | ICD-10-CM

## 2023-08-07 DIAGNOSIS — R2689 Other abnormalities of gait and mobility: Secondary | ICD-10-CM

## 2023-08-07 DIAGNOSIS — M79605 Pain in left leg: Secondary | ICD-10-CM | POA: Diagnosis not present

## 2023-08-07 DIAGNOSIS — M79604 Pain in right leg: Secondary | ICD-10-CM | POA: Diagnosis not present

## 2023-08-07 NOTE — Progress Notes (Signed)
 Complex Care Management Note  Care Guide Note 08/07/2023 Name: Andrea [name] MRN: [id number] DOB: 1955-11-30  Andrea [name] is a 68 y.o. year old female who sees Arn Lane, MD for primary care. I reached out to [employer] by phone today to offer complex care management services.  [name] was given information about Complex Care Management services today including:   The Complex Care Management services include support from the care team which includes your Nurse Care Manager, Clinical Social Worker, or Pharmacist.  The Complex Care Management team is here to help remove barriers to the health concerns and goals most important to you. Complex Care Management services are voluntary, and the patient may decline or stop services at any time by request to their care team member.   Complex Care Management Consent Status: Patient agreed to services and verbal consent obtained.   Follow up plan:  Telephone appointment with complex care management team member scheduled for:  6/18 with BSW and 6/25 with RNCM   Encounter Outcome:  Patient Scheduled  Barnie Bora  [facility]  [facility], [facility] Guide  Direct Dial: [phone]  Fax [phone]

## 2023-08-07 NOTE — Progress Notes (Signed)
 Complex Care Management Note Care Guide Note  08/07/2023 Name: Andrea [name] MRN: [id number] DOB: 09-12-1955   Complex Care Management Outreach Attempts: An unsuccessful telephone outreach was attempted today to offer the patient information about available complex care management services.  Follow Up Plan:  Additional outreach attempts will be made to offer the patient complex care management information and services.   Encounter Outcome:  No Answer  Barnie Bora  [facility]  [facility], [facility] Guide  Direct Dial: [phone]  Fax [phone]

## 2023-08-07 NOTE — Therapy (Signed)
 OUTPATIENT PHYSICAL THERAPY TREATMENT   Patient Name: Andrea [name] MRN: [id number] DOB:05-23-1955, 68 y.o., female Today's Date: 08/07/2023  END OF SESSION:  PT End of Session - 08/07/23 1037     Visit Number 4    Number of Visits 17    Date for PT Re-Evaluation 09/04/23    Authorization Type [employer] Dual Complete    PT Start Time 1100    PT Stop Time 1140    PT Time Calculation (min) 40 min    Activity Tolerance Patient tolerated treatment well    Behavior During Therapy WFL for tasks assessed/performed                Past Medical History:  Diagnosis Date   Abnormal mammogram 08/28/2014   08/28/14 - Probable benign microcalcifications over the outer mid to lower left breast. Bi-Rads 3. F/U 6 months    Allergic rhinitis 06/22/2014   Arm pain, lateral, left 04/28/2016   Asthma    Breast cancer (HCC) 03/16/2022   Carpal tunnel syndrome 04/26/2006   Qualifier: Diagnosis of  By: Serina Dane MD, Aaron     Femur fracture [facility]) 05/14/2021   Femur fracture, right (HCC) 05/14/2021   First degree AV block 09/22/2013   EKG 09/22/13    GANGLION CYST, WRIST, RIGHT 10/06/2008   Qualifier: Diagnosis of  By: Redia Candela  MD, Taineisha     Hypothyroidism    Imbalance 01/12/2017   Palpitations 09/22/2013   Right wrist pain 04/28/2016   Past Surgical History:  Procedure Laterality Date   ABDOMINAL HYSTERECTOMY     fibroids   BREAST BIOPSY Left 01/24/2022   MM LT BREAST BX W LOC DEV 1ST LESION IMAGE BX SPEC STEREO GUIDE 01/24/2022 [facility]   BREAST BIOPSY  03/15/2022   MM LT RADIOACTIVE SEED LOC MAMMO GUIDE 03/15/2022 [facility]   BREAST LUMPECTOMY WITH RADIOACTIVE SEED LOCALIZATION Left 03/16/2022   Procedure: LEFT BREAST LUMPECTOMY WITH RADIOACTIVE SEED LOCALIZATION;  Surgeon: Sim Dryer, MD;  Location: [facility];  Service: General;  Laterality: Left;   FEMUR IM NAIL Right 05/14/2021   Procedure: INTRAMEDULLARY (IM) RETROGRADE FEMORAL NAILING;   Surgeon: Virl Grimes, MD;  Location: [facility];  Service: Orthopedics;  Laterality: Right;   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   Normocytic anemia 07/12/2021   History of femur fracture 07/11/2021   Seasonal allergies 04/08/2021   Restrictive lung disease 03/25/2021   Prediabetes 03/25/2021   Thoracic aortic ectasia (HCC) 09/07/2020   Congestive heart failure (HCC) 08/03/2020   Esophageal reflux 08/14/2017   Vitamin D  deficiency 01/12/2017   Cataract 09/19/2013   Hypothyroidism 04/26/2006   Hyperlipidemia 04/26/2006   BMI 45.0-49.9, adult (HCC) 04/26/2006   Major depressive disorder, recurrent episode (HCC) 04/26/2006   HYPERTENSION, BENIGN SYSTEMIC 04/26/2006    PCP: Arn Lane, MD  REFERRING PROVIDER: Arn Lane, MD  REFERRING DIAG:  M54.16 (ICD-10-CM) - Lumbar radiculopathy M25.551 (ICD-10-CM) - Right hip pain  Rationale for Evaluation and Treatment: Rehabilitation  THERAPY DIAG:  Pain in right leg  Pain in left leg  Muscle weakness (generalized)  Other abnormalities of gait and mobility  ONSET DATE: Chronic  SUBJECTIVE:  SUBJECTIVE STATEMENT: Pt presents to PT with no current reports of pain. Has continued HEP compliance.    PERTINENT HISTORY:  L femur fx, HTN, CHF, breast cancer  PAIN:  Are you having pain?  Yes: NPRS scale: 1-2/10 Worst: 10/10 Pain location: bilateral LE (posterior/lateral hips) Pain description: sharp, numb Aggravating factors: walking,  standing Relieving factors: medication   PRECAUTIONS: None  RED FLAGS: None   WEIGHT BEARING RESTRICTIONS: No  FALLS:  Has patient fallen in last 6 months? No  LIVING ENVIRONMENT: Lives with: lives with their family Lives in: House/apartment Stairs: Yes: External: 4 steps; on right going up Has following equipment at home: None  OCCUPATION: Retired Designer, industrial/product  PLOF: Independent  PATIENT GOALS: improve standing tolerance, decrease pain  NEXT MD VISIT: 08/14/2023  OBJECTIVE:  Note: Objective measures were completed at Evaluation unless otherwise noted.  DIAGNOSTIC FINDINGS:  See imaging for recent lumbar MRI  PATIENT SURVEYS:   LEFS: 33/80  COGNITION: Overall cognitive status: Within functional limits for tasks assessed     SENSATION: Light touch: Impaired - anterior shin R  MUSCLE LENGTH: Deliz test: Right (+); Left (+)  POSTURE: rounded shoulders, forward head, increased lumbar lordosis, and large body habitus   PALPATION: TTP to bilateral posterior/lateral hip  LOWER EXTREMITY ROM:     Active  Right eval Left eval  Hip flexion    Hip extension    Hip abduction    Hip adduction    Hip internal rotation    Hip external rotation    Knee flexion [facility] WFL  Knee extension    Ankle dorsiflexion    Ankle plantarflexion    Ankle inversion    Ankle eversion     (Blank rows = not tested)  LOWER EXTREMITY MMT:    MMT Right eval Left eval  Hip flexion 4 3+  Hip extension    Hip abduction 3+ 3+  Hip adduction    Hip internal rotation    Hip external rotation    Knee flexion 4 4  Knee extension 4 4  Ankle dorsiflexion    Ankle plantarflexion    Ankle inversion    Ankle eversion     (Blank rows = not tested)  LUMBAR SPECIAL TESTS:  Straight leg raise test: Negative and Slump test: Negative  FUNCTIONAL TESTS:  30 Second Sit to Stand: 4 reps with UE  GAIT: Distance walked: 46ft Assistive device utilized: None Level of assistance:  SBA Comments: flexed trunk, decreased gait speed  TREATMENT: [facility] Adult PT Treatment:                                                DATE: 08/07/23 Nu-step L5 x 5 min UE/LE for functional activity tolerance Supine SLR 2x10 ea Hooklying clamshell 2x15 blue band Hooklying ball squeeze 2x10 - 5" hold Bridge with ball x 10  LTR x 5 LAQ 2x10 3# STS 3x5 no UE high table Standing hip abd/ext x 10 ea  [facility] Adult PT Treatment:                                                DATE: 07/31/23 Nu-step L5 x 5 min UE/LE Toe taps on cone one side only  2 x 10 with 1 HHA performed bil Standing hip abduction 2 x 10 with hands on table  Sit to stand from hi-lo table with table raised each set 2 x 5.  Ice pack x 6 min for L knee in supine  [facility] Adult PT Treatment:                                                DATE: 07/24/23 MTPR along the L piriformis using tennis ball Seated piriformis stretch LLE 2 x 30 sec Bridge 2 x 10  Sustained hip abduction 2 x 10 with Blue theraband UE ball press 2 x 10 holding 2 sec ea. Log rolling supine> sit position Sit to stand 2 x 5 and reviewed mechanics.  Nu-step l5 x 5 min UE/LE Updated HEP for sit to stand  PATIENT EDUCATION:  Education details: eval findings, ODI, HEP, POC Person educated: Patient Education method: Explanation, Demonstration, and Handouts Education comprehension: verbalized understanding and returned demonstration  HOME EXERCISE PROGRAM: Access Code: [email or web address] URL: [email or web address] Date: 07/24/2023 Prepared by: Laron Plummer  Exercises - Supine Quadricep Sets  - 1 x daily - 7 x weekly - 2 sets - 10 reps - 5 sec hold - Active Straight Leg Raise with Quad Set  - 1 x daily - 7 x weekly - 2 sets - 10 reps - Hooklying Clamshell with Resistance  - 1 x daily - 7 x weekly - 2-3 sets - 15 reps - green band hold - Beginner Bridge  - 1 x daily - 7 x weekly - 2 sets - 10 reps - Sit to Stand with Hands on Knees  - 1 x daily - 7 x  weekly - 2 sets - 10 reps  ASSESSMENT:  CLINICAL IMPRESSION: Pt was able to complete all prescribed exercises with good progression. Therapy today continued to progress and focus on core and LE strengthening in order to decrease her pain and improve functional mobility. She shows improving strength and activity tolerance. Pt continues to benefit from skilled PT services, will continue to progress as tolerated per POC.   Evaluation: Patient is a 68 y.o. F who was seen today for physical therapy evaluation and treatment for chronic LE pain with occasional LBP. Physical findings are consistent with MD impression as pt demonstrates decrease in core and LE strength and functional mobility. LEFS score shows severe disability in performance of home ADLs and community activities. Pt would benefit from skilled PT services working on improving strength, activity tolerance, and mobility while decreasing pain.    OBJECTIVE IMPAIRMENTS: Abnormal gait, decreased activity tolerance, decreased endurance, decreased mobility, difficulty walking, decreased ROM, decreased strength, and pain  ACTIVITY LIMITATIONS: carrying, lifting, standing, squatting, stairs, transfers, and locomotion level  PARTICIPATION LIMITATIONS: meal prep, cleaning, driving, shopping, community activity, occupation, and yard work  PERSONAL FACTORS: Time since onset of injury/illness/exacerbation and 3+ comorbidities: L femur fx, HTN, CHF, breast cancer are also affecting patient's functional outcome.   REHAB POTENTIAL: Good  CLINICAL DECISION MAKING: Evolving/moderate complexity  EVALUATION COMPLEXITY: Moderate   GOALS: Goals reviewed with patient? No  SHORT TERM GOALS: Target date: 07/31/2023  Pt will be compliant and knowledgeable with initial HEP for improved comfort and carryover Baseline: initial HEP given Goal status: MET 07/31/23  2.  Pt will self report LE pain no greater than 7/10 for improved comfort and  functional  ability Baseline: 10/10 at worst Goal status: Partially met 07/31/23   LONG TERM GOALS: Target date: 09/04/2023   Pt will improve LEFS to no less than 40/80 as proxy for functional improvement with home ADLs and higher level community activity with home ADLs and community activities Baseline: 33/80 Goal status: INITIAL   2.  Pt will self report LE pain no greater than 3/10 for improved comfort and functional ability Baseline: 10/10 at worst Goal status: INITIAL   3.  Pt will increase 30 Second Sit to Stand rep count to no less than 8 reps for improved balance, strength, and functional mobility Baseline: 4 reps with UE Goal status: INITIAL   4.  Pt will improve standing activity tolerance to no less than 20 minutes for improved comfort and functional ability with shopping and church activities Baseline: 4-5 minute Goal status: INITIAL  5.  Pt will improve all LE MMT to no less than 4/5 for improved functional mobility and decrease pain Baseline:  Goal status: INITIAL  PLAN:  PT FREQUENCY: 2x/week  PT DURATION: 8 weeks  PLANNED INTERVENTIONS: 97164- PT Re-evaluation, 97110-Therapeutic exercises, 97530- Therapeutic activity, [id number]- Neuromuscular re-education, 97535- Self Care, 16109- Manual therapy, [id number]- Gait training, [id number]- Aquatic Therapy, [id number]- Electrical stimulation (unattended), [id number]- Electrical stimulation (manual), 97016- Vasopneumatic device, Cryotherapy, and Moist heat.  PLAN FOR NEXT SESSION: assess HEP response, strengthening in gym and aquatic environments, standing activity tolerances   Ivor Mars PT 08/07/23  11:44 AM

## 2023-08-08 ENCOUNTER — Telehealth: Payer: Self-pay

## 2023-08-08 NOTE — Progress Notes (Signed)
 Complex Care Management Note Care Guide Note  08/08/2023 Name: Andrea [name] MRN: [id number] DOB: 10/07/1955  [name] is a 68 y.o. year old female who is a primary care patient of [name], Jonah Negus, MD . The community resource team was consulted for assistance with Transportation Needs , Food Insecurity, and housing and utilities.  SDOH screenings and interventions completed:  Yes  Social Drivers of Health From This Encounter   Food Insecurity: Food Insecurity Present (08/08/2023)   Hunger Vital Sign    Worried About Running Out of Food in the Last Year: Sometimes true    Ran Out of Food in the Last Year: Sometimes true  Financial Resource Strain: Medium Risk (08/08/2023)   Overall Financial Resource Strain (CARDIA)    Difficulty of Paying Living Expenses: Somewhat hard  Transportation Needs: Unmet Transportation Needs (08/08/2023)   PRAPARE - Administrator, Civil Service (Medical): Yes    Lack of Transportation (Non-Medical): No  Utilities: At Risk (08/08/2023)   Utilities    Threatened with loss of utilities: Yes    SDOH Interventions Today    Flowsheet Row Most Recent Value  SDOH Interventions   Food Insecurity Interventions Other (Comment)  [facility] [employer] list.]  Transportation Interventions Other (Comment), 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 Patient is registered with [employer] but needs to go in person to have picture taken to update ID. Will mail patient information for [employer].]  Utilities Interventions Other (Comment)  [Mailed information for utility assistance.]        Care guide performed the following interventions: Patient provided with information about care guide support team and interviewed to confirm resource needs.  Follow Up Plan:  No further follow up planned at this time. The patient has been provided with needed resources.  Encounter Outcome:  Patient Visit  Completed  [name]  [facility] Guide Direct Dial: [phone]  Fax: [phone] Website: [email or web address]

## 2023-08-09 ENCOUNTER — Ambulatory Visit: Admitting: Physical Therapy

## 2023-08-09 ENCOUNTER — Encounter: Payer: Self-pay | Admitting: Physical Therapy

## 2023-08-09 ENCOUNTER — Encounter

## 2023-08-09 DIAGNOSIS — R2689 Other abnormalities of gait and mobility: Secondary | ICD-10-CM | POA: Diagnosis not present

## 2023-08-09 DIAGNOSIS — M79604 Pain in right leg: Secondary | ICD-10-CM | POA: Diagnosis not present

## 2023-08-09 DIAGNOSIS — M6281 Muscle weakness (generalized): Secondary | ICD-10-CM

## 2023-08-09 DIAGNOSIS — M79605 Pain in left leg: Secondary | ICD-10-CM | POA: Diagnosis not present

## 2023-08-09 NOTE — Therapy (Signed)
 OUTPATIENT PHYSICAL THERAPY TREATMENT   Patient Name: ALTIE SAVARD MRN: [id number] DOB:11-02-1955, 68 y.o., female Today's Date: 08/09/2023  END OF SESSION:  PT End of Session - 08/09/23 1356     Visit Number 5    Number of Visits 17    Date for PT Re-Evaluation 09/04/23    Authorization Type [employer] Dual Complete    PT Start Time 1400    PT Stop Time 1441    PT Time Calculation (min) 41 min    Activity Tolerance Patient tolerated treatment well    Behavior During Therapy WFL for tasks assessed/performed              Past Medical History:  Diagnosis Date   Abnormal mammogram 08/28/2014   08/28/14 - Probable benign microcalcifications over the outer mid to lower left breast. Bi-Rads 3. F/U 6 months    Allergic rhinitis 06/22/2014   Arm pain, lateral, left 04/28/2016   Asthma    Breast cancer (HCC) 03/16/2022   Carpal tunnel syndrome 04/26/2006   Qualifier: Diagnosis of  By: Serina Dane MD, Aaron     Femur fracture [facility]) 05/14/2021   Femur fracture, right (HCC) 05/14/2021   First degree AV block 09/22/2013   EKG 09/22/13    GANGLION CYST, WRIST, RIGHT 10/06/2008   Qualifier: Diagnosis of  By: Redia Candela  MD, Taineisha     Hypothyroidism    Imbalance 01/12/2017   Palpitations 09/22/2013   Right wrist pain 04/28/2016   Past Surgical History:  Procedure Laterality Date   ABDOMINAL HYSTERECTOMY     fibroids   BREAST BIOPSY Left 01/24/2022   MM LT BREAST BX W LOC DEV 1ST LESION IMAGE BX SPEC STEREO GUIDE 01/24/2022 [facility]   BREAST BIOPSY  03/15/2022   MM LT RADIOACTIVE SEED LOC MAMMO GUIDE 03/15/2022 [facility]   BREAST LUMPECTOMY WITH RADIOACTIVE SEED LOCALIZATION Left 03/16/2022   Procedure: LEFT BREAST LUMPECTOMY WITH RADIOACTIVE SEED LOCALIZATION;  Surgeon: Sim Dryer, MD;  Location: [facility];  Service: General;  Laterality: Left;   FEMUR IM NAIL Right 05/14/2021   Procedure: INTRAMEDULLARY (IM) RETROGRADE FEMORAL NAILING;   Surgeon: Virl Grimes, MD;  Location: [facility];  Service: Orthopedics;  Laterality: Right;   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   Normocytic anemia 07/12/2021   History of femur fracture 07/11/2021   Seasonal allergies 04/08/2021   Restrictive lung disease 03/25/2021   Prediabetes 03/25/2021   Thoracic aortic ectasia (HCC) 09/07/2020   Congestive heart failure (HCC) 08/03/2020   Esophageal reflux 08/14/2017   Vitamin D  deficiency 01/12/2017   Cataract 09/19/2013   Hypothyroidism 04/26/2006   Hyperlipidemia 04/26/2006   BMI 45.0-49.9, adult (HCC) 04/26/2006   Major depressive disorder, recurrent episode (HCC) 04/26/2006   HYPERTENSION, BENIGN SYSTEMIC 04/26/2006    PCP: Arn Lane, MD  REFERRING PROVIDER: Arn Lane, MD  REFERRING DIAG:  M54.16 (ICD-10-CM) - Lumbar radiculopathy M25.551 (ICD-10-CM) - Right hip pain  Rationale for Evaluation and Treatment: Rehabilitation  THERAPY DIAG:  Pain in right leg  Pain in left leg  Muscle weakness (generalized)  Other abnormalities of gait and mobility  ONSET DATE: Chronic  SUBJECTIVE:  SUBJECTIVE STATEMENT: Pt presents to PT with no current reports of pain. Has continued HEP compliance.    PERTINENT HISTORY:  L femur fx, HTN, CHF, breast cancer  PAIN:  Are you having pain?  Yes: NPRS scale: 1-2/10 Worst: 10/10 Pain location: bilateral LE (posterior/lateral hips) Pain description: sharp, numb Aggravating factors: walking,  standing Relieving factors: medication   PRECAUTIONS: None  RED FLAGS: None   WEIGHT BEARING RESTRICTIONS: No  FALLS:  Has patient fallen in last 6 months? No  LIVING ENVIRONMENT: Lives with: lives with their family Lives in: House/apartment Stairs: Yes: External: 4 steps; on right going up Has following equipment at home: None  OCCUPATION: Retired Designer, industrial/product  PLOF: Independent  PATIENT GOALS: improve standing tolerance, decrease pain  NEXT MD VISIT: 08/14/2023  OBJECTIVE:  Note: Objective measures were completed at Evaluation unless otherwise noted.  DIAGNOSTIC FINDINGS:  See imaging for recent lumbar MRI  PATIENT SURVEYS:   LEFS: 33/80  COGNITION: Overall cognitive status: Within functional limits for tasks assessed     SENSATION: Light touch: Impaired - anterior shin R  MUSCLE LENGTH: Hamza test: Right (+); Left (+)  POSTURE: rounded shoulders, forward head, increased lumbar lordosis, and large body habitus   PALPATION: TTP to bilateral posterior/lateral hip  LOWER EXTREMITY ROM:     Active  Right eval Left eval  Hip flexion    Hip extension    Hip abduction    Hip adduction    Hip internal rotation    Hip external rotation    Knee flexion [facility] WFL  Knee extension    Ankle dorsiflexion    Ankle plantarflexion    Ankle inversion    Ankle eversion     (Blank rows = not tested)  LOWER EXTREMITY MMT:    MMT Right eval Left eval  Hip flexion 4 3+  Hip extension    Hip abduction 3+ 3+  Hip adduction    Hip internal rotation    Hip external rotation    Knee flexion 4 4  Knee extension 4 4  Ankle dorsiflexion    Ankle plantarflexion    Ankle inversion    Ankle eversion     (Blank rows = not tested)  LUMBAR SPECIAL TESTS:  Straight leg raise test: Negative and Slump test: Negative  FUNCTIONAL TESTS:  30 Second Sit to Stand: 4 reps with UE  GAIT: Distance walked: 38ft Assistive device utilized: None Level of assistance:  SBA Comments: flexed trunk, decreased gait speed  TREATMENT:  TREATMENT 08/09/23:  Aquatic therapy at [facility]- [address] - therapeutic pool temp 92 degrees Pt enters building independently.  Treatment took place in water 3.8 to  4 ft 8 in.feet deep depending upon activity.  Pt entered and exited the pool via stair and handrails    Aquatic Therapy:  Water walking for warm up fwd w/ yellow noodle  x15 reps ea  Hip ext EOP Hip abd EOP Squat UE support HS curl Heel raises Hip abd circles Blue noodle stomp  Lateral walking UE support  Pt requires the buoyancy of water for active assisted exercises with buoyancy supported for strengthening and AROM exercises. Hydrostatic pressure also supports joints by unweighting joint load by at least 50 % in 3-4 feet depth water. 80% in chest to neck deep water. Water will provide assistance with movement using the current and laminar flow while the buoyancy reduces weight bearing. Pt requires the viscosity of the water for resistance with strengthening exercises.  [facility] Adult PT Treatment:                                                DATE: 08/07/23 Nu-step L5 x 5 min UE/LE for functional activity tolerance Supine SLR 2x10 ea Hooklying clamshell 2x15 blue band Hooklying ball squeeze 2x10 - 5 hold Bridge with ball x 10  LTR x 5 LAQ 2x10 3# STS 3x5 no UE high table Standing hip abd/ext x 10 ea  [facility] Adult PT Treatment:                                                DATE: 07/31/23 Nu-step L5 x 5 min UE/LE Toe taps on cone one side only 2 x 10 with 1 HHA performed bil Standing hip abduction 2 x 10 with hands on table  Sit to stand from hi-lo table with table raised each set 2 x 5.  Ice pack x 6 min for L knee in supine  [facility] Adult PT Treatment:                                                DATE: 07/24/23 MTPR along the L piriformis using tennis ball Seated piriformis stretch LLE 2 x 30 sec Bridge 2 x 10  Sustained hip abduction  2 x 10 with Blue theraband UE ball press 2 x 10 holding 2 sec ea. Log rolling supine> sit position Sit to stand 2 x 5 and reviewed mechanics.  Nu-step l5 x 5 min UE/LE Updated HEP for sit to stand  PATIENT EDUCATION:  Education details: eval findings, ODI, HEP, POC Person educated: Patient Education method: Explanation, Demonstration, and Handouts Education comprehension: verbalized understanding and returned demonstration  HOME EXERCISE PROGRAM: Access Code: [email or web address] URL: [email or web address] Date: 07/24/2023 Prepared by: Laron Plummer  Exercises - Supine Quadricep Sets  - 1 x daily - 7 x weekly - 2 sets - 10 reps - 5 sec hold - Active Straight Leg Raise with Quad Set  - 1 x daily - 7 x weekly - 2 sets - 10 reps - Hooklying Clamshell with Resistance  - 1 x daily - 7 x weekly - 2-3 sets - 15 reps - green band hold - Beginner Bridge  - 1 x daily - 7 x weekly - 2 sets - 10 reps - Sit to Stand with Hands on Knees  - 1 x daily - 7 x weekly - 2 sets - 10 reps  ASSESSMENT:  CLINICAL IMPRESSION: Session today focused on hip strength in the aquatic environment for use of buoyancy to offload joints and the viscosity of water as resistance during therapeutic exercise.  Pt with initial high levels of pain which reduced during session.  Pt continues to demonstrate antalgic gait despite decreased load but is able to complete session with no increased pain.  Patient was able to tolerate all prescribed exercises in the aquatic environment with no adverse effects and reports 5/10 pain at the end of the session. Patient continues to benefit from skilled PT services on land and aquatic based and should be progressed as  able to improve functional independence.    Evaluation: Patient is a 68 y.o. F who was seen today for physical therapy evaluation and treatment for chronic LE pain with occasional LBP. Physical findings are consistent with MD impression as pt demonstrates decrease in  core and LE strength and functional mobility. LEFS score shows severe disability in performance of home ADLs and community activities. Pt would benefit from skilled PT services working on improving strength, activity tolerance, and mobility while decreasing pain.    OBJECTIVE IMPAIRMENTS: Abnormal gait, decreased activity tolerance, decreased endurance, decreased mobility, difficulty walking, decreased ROM, decreased strength, and pain  ACTIVITY LIMITATIONS: carrying, lifting, standing, squatting, stairs, transfers, and locomotion level  PARTICIPATION LIMITATIONS: meal prep, cleaning, driving, shopping, community activity, occupation, and yard work  PERSONAL FACTORS: Time since onset of injury/illness/exacerbation and 3+ comorbidities: L femur fx, HTN, CHF, breast cancer are also affecting patient's functional outcome.   REHAB POTENTIAL: Good  CLINICAL DECISION MAKING: Evolving/moderate complexity  EVALUATION COMPLEXITY: Moderate   GOALS: Goals reviewed with patient? No  SHORT TERM GOALS: Target date: 07/31/2023  Pt will be compliant and knowledgeable with initial HEP for improved comfort and carryover Baseline: initial HEP given Goal status: MET 07/31/23  2.  Pt will self report LE pain no greater than 7/10 for improved comfort and functional ability Baseline: 10/10 at worst Goal status: Partially met 07/31/23   LONG TERM GOALS: Target date: 09/04/2023   Pt will improve LEFS to no less than 40/80 as proxy for functional improvement with home ADLs and higher level community activity with home ADLs and community activities Baseline: 33/80 Goal status: INITIAL   2.  Pt will self report LE pain no greater than 3/10 for improved comfort and functional ability Baseline: 10/10 at worst Goal status: INITIAL   3.  Pt will increase 30 Second Sit to Stand rep count to no less than 8 reps for improved balance, strength, and functional mobility Baseline: 4 reps with UE Goal status: INITIAL    4.  Pt will improve standing activity tolerance to no less than 20 minutes for improved comfort and functional ability with shopping and church activities Baseline: 4-5 minute Goal status: INITIAL  5.  Pt will improve all LE MMT to no less than 4/5 for improved functional mobility and decrease pain Baseline:  Goal status: INITIAL  PLAN:  PT FREQUENCY: 2x/week  PT DURATION: 8 weeks  PLANNED INTERVENTIONS: 97164- PT Re-evaluation, 97110-Therapeutic exercises, 97530- Therapeutic activity, [id number]- Neuromuscular re-education, 97535- Self Care, 36644- Manual therapy, [id number]- Gait training, [id number]- Aquatic Therapy, [id number]- Electrical stimulation (unattended), [id number]- Electrical stimulation (manual), 97016- Vasopneumatic device, Cryotherapy, and Moist heat.  PLAN FOR NEXT SESSION: assess HEP response, strengthening in gym and aquatic environments, standing activity tolerances   Donovan Gallant Rexanne Inocencio PT 08/09/23  2:51 PM

## 2023-08-14 ENCOUNTER — Ambulatory Visit (INDEPENDENT_AMBULATORY_CARE_PROVIDER_SITE_OTHER): Admitting: Family Medicine

## 2023-08-14 ENCOUNTER — Encounter: Payer: Self-pay | Admitting: Family Medicine

## 2023-08-14 VITALS — BP 114/88 | HR 64 | Ht 63.0 in | Wt 263.8 lb

## 2023-08-14 DIAGNOSIS — J984 Other disorders of lung: Secondary | ICD-10-CM | POA: Diagnosis not present

## 2023-08-14 DIAGNOSIS — E038 Other specified hypothyroidism: Secondary | ICD-10-CM | POA: Diagnosis not present

## 2023-08-14 DIAGNOSIS — I509 Heart failure, unspecified: Secondary | ICD-10-CM | POA: Diagnosis not present

## 2023-08-14 DIAGNOSIS — G4709 Other insomnia: Secondary | ICD-10-CM | POA: Diagnosis not present

## 2023-08-14 DIAGNOSIS — R748 Abnormal levels of other serum enzymes: Secondary | ICD-10-CM

## 2023-08-14 DIAGNOSIS — I10 Essential (primary) hypertension: Secondary | ICD-10-CM

## 2023-08-14 DIAGNOSIS — E785 Hyperlipidemia, unspecified: Secondary | ICD-10-CM | POA: Diagnosis not present

## 2023-08-14 NOTE — Assessment & Plan Note (Addendum)
 Resume home inhalers

## 2023-08-14 NOTE — Assessment & Plan Note (Addendum)
 Repeat BP looks great Medication adherence discussed

## 2023-08-14 NOTE — Assessment & Plan Note (Signed)
 Cmet rechecked

## 2023-08-14 NOTE — Assessment & Plan Note (Addendum)
 May take 5-10 mg Melatonin prn Graded exercise discuss to improve her strength

## 2023-08-14 NOTE — Progress Notes (Signed)
    SUBJECTIVE:   CHIEF COMPLAINT / HPI:   Insomnia/Fatigue: Waxes and wanes. Uses Melatonin 5mg  qhs prn. However, she will wake up in the middle of the night even with Melatonin and keep awake till morning. She does not get a lot of exercise. However, she does PT and does some of the home PT exercise instructions provided once or twice a week.   SOB:  Not been taking her inhalers. Still have occasional resp distress with exertion. Denies any symptoms or concerns today.   HTN: Missed her Coreg  last night  Thyroid : Poor adherence to Synthroid  125 mcg QD. Will miss 3 of her doses weekly.  HLD/HTN:  he held her Statin and fish oil  as instructed due to elevated liver enzymes. Here for a recheck today. No GI symptoms. Compliant with BP meds, will miss a few doses here and there.  Back pain: Improved with Gabapentin . Refills requested.    PERTINENT  PMH / PSH: PMHx reviewed  OBJECTIVE:   BP 114/88   Pulse 64   Ht 5' 3 (1.6 m)   Wt 263 lb 12.8 oz (119.7 kg)   SpO2 94%   BMI 46.73 kg/m   Physical Exam Vitals and nursing note reviewed.   Cardiovascular:     Rate and Rhythm: Normal rate and regular rhythm.     Heart sounds: Normal heart sounds. No murmur heard. Pulmonary:     Effort: Pulmonary effort is normal. No respiratory distress.     Breath sounds: Normal breath sounds. No wheezing or rhonchi.  Abdominal:     General: Abdomen is flat. There is no distension.     Palpations: There is no mass.     Tenderness: There is no abdominal tenderness.   Musculoskeletal:     Right lower leg: No edema.     Left lower leg: No edema.      ASSESSMENT/PLAN:   Assessment & Plan Hyperlipidemia, unspecified hyperlipidemia type FLP rechecked Elevated liver enzymes Cmet rechecked Other specified hypothyroidism Did not check TSH today due to poor compliance We will recheck in 4 weeks Compliance discussed HYPERTENSION, BENIGN SYSTEMIC Repeat BP looks great Medication adherence  discussed Other insomnia May take 5-10 mg Melatonin prn Graded exercise discuss to improve her strength  Restrictive lung disease Resume home inhalers Congestive heart failure, unspecified HF chronicity, unspecified heart failure type (HCC) No acute change Euvolemic Most recent ECHO in 2023 reviewed Consider repeat Discuss at next appointment     Penni Bowman, MD [facility]

## 2023-08-14 NOTE — Assessment & Plan Note (Addendum)
 Did not check TSH today due to poor compliance We will recheck in 4 weeks Compliance discussed

## 2023-08-14 NOTE — Assessment & Plan Note (Addendum)
 No acute change Euvolemic Most recent ECHO in 2023 reviewed Consider repeat Discuss at next appointment

## 2023-08-14 NOTE — Assessment & Plan Note (Signed)
 FLP rechecked

## 2023-08-14 NOTE — Patient Instructions (Addendum)
 Fatty Liver Disease (Steatotic Liver Disease): What to Know  Your liver is an organ with many jobs. It makes proteins and helps change food into energy. It also gets rid of harmful things in your blood and absorbs vitamins from food. Fatty liver disease happens when too much fat builds up in your liver cells. It's also called steatotic liver disease. In many cases, fatty liver disease doesn't cause symptoms. But over time, it can cause irritation and swelling. This can lead to other liver problems, such as: Cirrhosis, or scarring of the liver. Liver cancer. Liver failure. What are the causes? Fatty liver disease may be caused by: Being overweight. Having: High cholesterol. High blood pressure. Cushing syndrome. Not getting enough nutrients in your diet. Other causes include: Certain drugs. Poisons. Some infections caused by a germ called a virus. What increases the risk? You're more likely to get fatty liver disease if: You drink alcohol. You're overweight. You have diabetes. You have hepatitis. You have a high triglyceride level. You're pregnant. What are the signs or symptoms? You may not have symptoms. If you do, they may include: Feeling weak and tired. Losing weight. Feeling like you may throw up. Throwing up. Jaundice. This is when your skin or the white parts of your eyes turn yellow. Swelling in your belly or legs. Tenderness in the right-upper part of your belly. How is this diagnosed? Fatty liver disease may be diagnosed based on your medical history and an exam. You may also need tests. These may include: Blood tests. An ultrasound. A CT scan. An MRI. A biopsy. This is when a small piece of tissue is removed from your liver for testing. How is this treated? Fatty liver disease is often caused by other conditions. You may need to take medicines and make changes to your daily life. These changes may help you manage conditions, such as: Alcohol use disorder. This  is a condition where you may not be able to stop drinking. High cholesterol. Diabetes. Being overweight. Follow these instructions at home: Eat healthy. Work with your health care provider or an expert in healthy eating called a dietitian. They can help you make an eating plan. Get enough exercise. This can help you lose weight. It can also help you manage your cholesterol and diabetes. Talk to your provider about an exercise plan. Ask what things are best for you to do. Do not drink alcohol. If you have trouble quitting, ask your provider for help. Take your medicines only as told. Keep all follow-up visits. Your provider will check if you're getting better. Contact a health care provider if: You can't control your blood sugar. This is extra important if you have diabetes. You have a fever. You have swelling in your belly or legs. You have belly pain. You have jaundice. You feel like you may throw up. You throw up. Get help right away if: You throw up, and it looks like: Bright red blood. Coffee grounds. You throw up something that looks like coffee ground. Your poop looks bloody or black. You get confused. These symptoms may be an emergency. Call 911 right away. Do not wait to see if the symptoms will go away. Do not drive yourself to the hospital. This information is not intended to replace advice given to you by your health care provider. Make sure you discuss any questions you have with your health care provider. Document Revised: 08/03/2022 Document Reviewed: 08/03/2022 Elsevier Patient Education  2024 [employer].

## 2023-08-15 ENCOUNTER — Ambulatory Visit: Payer: Self-pay | Admitting: Family Medicine

## 2023-08-15 ENCOUNTER — Other Ambulatory Visit: Payer: Self-pay

## 2023-08-15 LAB — LIPID PANEL
Chol/HDL Ratio: 5.8 ratio — ABNORMAL HIGH (ref 0.0–4.4)
Cholesterol, Total: 231 mg/dL — ABNORMAL HIGH (ref 100–199)
HDL: 40 mg/dL (ref >39)
LDL Chol Calc (NIH): 163 mg/dL — ABNORMAL HIGH (ref 0–99)
Triglycerides: 152 mg/dL — ABNORMAL HIGH (ref 0–149)
VLDL Cholesterol Cal: 28 mg/dL (ref 5–40)

## 2023-08-15 LAB — CMP14+EGFR
ALT: 66 IU/L — ABNORMAL HIGH (ref 0–32)
AST: 176 IU/L — ABNORMAL HIGH (ref 0–40)
Albumin: 3.6 g/dL — ABNORMAL LOW (ref 3.9–4.9)
Alkaline Phosphatase: 138 IU/L — ABNORMAL HIGH (ref 44–121)
BUN/Creatinine Ratio: 14 (ref 12–28)
BUN: 14 mg/dL (ref 8–27)
Bilirubin Total: 0.4 mg/dL (ref 0.0–1.2)
CO2: 23 mmol/L (ref 20–29)
Calcium: 9.1 mg/dL (ref 8.7–10.3)
Chloride: 101 mmol/L (ref 96–106)
Creatinine, Ser: 1.03 mg/dL — ABNORMAL HIGH (ref 0.57–1.00)
Globulin, Total: 3.3 g/dL (ref 1.5–4.5)
Glucose: 92 mg/dL (ref 70–99)
Potassium: 3.5 mmol/L (ref 3.5–5.2)
Sodium: 142 mmol/L (ref 134–144)
Total Protein: 6.9 g/dL (ref 6.0–8.5)
eGFR: 60 mL/min/{1.73_m2} (ref >59)

## 2023-08-15 NOTE — Progress Notes (Signed)
 Update. Her follow up appointment in in July. She need to see me in 1 week. I will forward message to staff to help her schedule an appointment with me in 1 week.  Blue team CMA, please contact patient to help her schedule an appointment with me in 1 week. Thanks in advance.

## 2023-08-15 NOTE — Telephone Encounter (Signed)
 A HIPAA-compliant callback message was left. Unable to reach her at this time.    Her result was reviewed. FLP worsened since she has been off Crestor  and Fish oil  due to hepatotoxicity. Unfortunately, her liver enzymes worsened despite this. I will discuss the referral to a cholesterol clinic to manage her hyperlipidemia. She might benefit from Repatha instead.  As mentioned earlier, her liver function worsened. Continue to hold statins and fish oil . Other agents that can cause hepatotoxicity include: Amlodipine  - plan to hold till. She was previously on Aldactone  and HCTZ-Olmesartan but was held due to low BP and renal impairment. I hesitate to increase her Coreg  due to mostly low normal HR. ARBs/ACEi and Aldactone  can all cause hepatotoxicity. The only option left is Hydralazine or Clonidine - schedule an appointment for medication adjustment to avoid confusion.  Famotidine  - Discontinue Fenoprofen - Not taking, will take off her med list Zoloft  - Chronically on meds for depression. Plan to reduce dose.  A follow-up appointment has been scheduled for 1 week already.

## 2023-08-15 NOTE — Patient Instructions (Signed)
 Visit Information  Thank you for taking time to visit with me today. Please don't hesitate to contact me if I can be of assistance to you before our next scheduled appointment.  Our next appointment is by telephone on 09/03/2023 at 11AM. Please call the care guide team at [phone] if you need to cancel or reschedule your appointment.   Following is a copy of your care plan:   Goals Addressed             This Visit's Progress    BSW VBCI Social Work Care Plan   On track    Problems:   Corporate treasurer , Geophysicist/field seismologist , and Transportation  CSW Clinical Goal(s):   Over the next 2.5 weeks the Patient will work with Child psychotherapist to address concerns related to food insecurity, transportation and financial strain. .  Interventions:  SW will provide Pt local resources for food pantries. SW will call [facility] [place] to confirm if Pt is registered with them and what next steps are needed to secure transportation.    Patient Goals/Self-Care Activities:  Pt will attempt to go to [facility] depot  to update her picture for [facility] [place].   Plan:   Telephone follow up appointment with care management team member scheduled for:  09/03/2023 at 11AM        Please call the Suicide and Crisis Lifeline: 988 go to [facility] [address] [place] [phone]) call 911 if you are experiencing a Mental Health or Behavioral Health Crisis or need someone to talk to.  Patient verbalizes understanding of instructions and care plan provided today and agrees to view in MyChart. Active MyChart status and patient understanding of how to access instructions and care plan via MyChart confirmed with patient.     Burt Casco, BSW [place]/VBCI - [employer] Social Worker [phone]

## 2023-08-15 NOTE — Progress Notes (Signed)
 Patient is coming in on 06/27 @950  am for 1 week f/u

## 2023-08-15 NOTE — Patient Outreach (Signed)
 Complex Care Management   Visit Note  08/15/2023  Name:  Andrea [name] MRN: [id number] DOB: Sep 03, 1955  Situation: Referral received for Complex Care Management related to SDOH Barriers:  Transportation Food insecurity Financial Resource Strain I obtained verbal consent from Patient.  Visit completed with patient  on the phone  Background:   Past Medical History:  Diagnosis Date   Abnormal mammogram 08/28/2014   08/28/14 - Probable benign microcalcifications over the outer mid to lower left breast. Bi-Rads 3. F/U 6 months    Allergic rhinitis 06/22/2014   Arm pain, lateral, left 04/28/2016   Asthma    Breast cancer (HCC) 03/16/2022   Carpal tunnel syndrome 04/26/2006   Qualifier: Diagnosis of  By: Serina Dane MD, Aaron     Femur fracture [facility]) 05/14/2021   Femur fracture, right (HCC) 05/14/2021   First degree AV block 09/22/2013   EKG 09/22/13    GANGLION CYST, WRIST, RIGHT 10/06/2008   Qualifier: Diagnosis of  By: Redia Candela  MD, Taineisha     Hypothyroidism    Imbalance 01/12/2017   Palpitations 09/22/2013   Right wrist pain 04/28/2016    Assessment: SW conducted initial call with Pt over the phone. SDOH were assessed and the following needs were identified: food insecurity, transportation, and resource financial strain. Pt states she receives food stamps, PCS services, and has a Careers information officer. Pt states her family will provide her food if she does not have any but could use referrals to local food resources. Pt states she lives with her adult grandson and he sometimes helps with food cost. Pt states she does not work and is living off of SSI and retirement. Pt confirmed grandson is working, but in the process of looking for a different job. Pt she would like to move from her apartment complex due to her neighbors constantly smoking. Pt states she applied for section 8 through [facility] and has not had a response from anyone. SW provided pt [facility] phone # for pt to follow up on her  application. Pt agreed to call and follow up. Pt currently uses her transportation benefit provided by her insurance provider for medical transportation. Pt believes to be registered with [facility], but needs to update her picture. SW will call [facility] to confirm registration and next steps for patient to set up transportation with them. Pt states she is in need of seeing a dental and eye provider. Pt agreed and was knowledgeable on how to contact her insurance provider to inquire about nearby in-network providers. Pt was asked if she needed any additional referrals or had other needs. Pt responded stating she needs a new mattress. SW will follow-up on this unique need after searching for possible resources. Pt agreed. SW provided Pt direct phone number.   SDOH Interventions    Flowsheet Row Patient Outreach Telephone from 08/15/2023 in [place] POPULATION HEALTH DEPARTMENT Telephone from 08/08/2023 in [place] POPULATION HEALTH DEPARTMENT Clinical Support from 07/26/2023 in [facility] - [facility] Dept Of [place]. [facility] Office Visit from 05/29/2023 in [facility] - A Dept Of [place]. [facility] Clinical Support from 05/01/2022 in [facility] - [facility] Dept Of . [facility] Office Visit from 03/31/2022 in [facility] - A Dept Of Tommas Fragmin. [facility]  SDOH Interventions        Food Insecurity Interventions Community Resources Provided Other (Comment)  Estelita Hel[place]  [place] [employer] list.] [employer] Referral, Other (Comment)  [food pantry list] -- [id number] Referral, Other (Comment)  [food pantry list] --  Housing Interventions Intervention Not Indicated -- Intervention Not Indicated -- -- --  Transportation Interventions Other (Comment), Payor Benefit  [Pt uses insurance transportation benefit. Patient is registered with [facility] but needs to go in person to have picture taken to update ID. Will mail  patient information for [facility] [employer]. Pt has not been able to visit [place] due to pain in leg.] Other (Comment), Payor Benefit  [Patient uses [employer] transportation benefit. Patient is registered with [facility] but needs to go in person to have picture taken to update ID. Will mail patient information for [facility] [employer].] Payor Benefit -- Payor Benefit --  Utilities Interventions Intervention Not Indicated Other (Comment)  [Mailed information for utility assistance.] [id number] Referral -- -- --  Alcohol Usage Interventions -- -- Intervention Not Indicated (Score <7) -- -- --  Depression Interventions/Treatment  -- -- WUX3-2 Score <4 Follow-up Not Indicated Counseling, Medication -- Medication, Counseling  Financial Strain Interventions Community Resources Provided -- [id number] Referral -- -- --  Physical Activity Interventions -- -- Intervention Not Indicated  [She has [employer] membership] -- -- Local [employer]  [She has YMCA membership]  Stress Interventions -- -- Intervention Not Indicated -- -- --  Social Connections Interventions -- -- Intervention Not Indicated -- -- --      Recommendation:   Pt will follow up on her section 8 housing application with [facility].  Follow Up Plan:   Telephone follow up appointment date/time:  09/03/2023 at 11AM.  Burt Casco, BSW [place]/[facility] - [facility] Social Worker [phone]

## 2023-08-18 ENCOUNTER — Other Ambulatory Visit: Payer: Self-pay | Admitting: Family Medicine

## 2023-08-21 ENCOUNTER — Encounter: Payer: Self-pay | Admitting: Physical Therapy

## 2023-08-21 ENCOUNTER — Ambulatory Visit: Admitting: Physical Therapy

## 2023-08-21 DIAGNOSIS — M79605 Pain in left leg: Secondary | ICD-10-CM | POA: Diagnosis not present

## 2023-08-21 DIAGNOSIS — M79604 Pain in right leg: Secondary | ICD-10-CM | POA: Diagnosis not present

## 2023-08-21 DIAGNOSIS — M6281 Muscle weakness (generalized): Secondary | ICD-10-CM | POA: Diagnosis not present

## 2023-08-21 DIAGNOSIS — R2689 Other abnormalities of gait and mobility: Secondary | ICD-10-CM | POA: Diagnosis not present

## 2023-08-21 NOTE — Therapy (Signed)
 OUTPATIENT PHYSICAL THERAPY TREATMENT   Patient Name: DYANARA COZZA MRN: [id number] DOB:12/29/1955, 68 y.o., female Today's Date: 08/21/2023  END OF SESSION:  PT End of Session - 08/21/23 1141     Visit Number 6    Number of Visits 17    Date for PT Re-Evaluation 09/04/23    Authorization Type UHC Dual Complete    PT Start Time 1145    PT Stop Time 1226    PT Time Calculation (min) 41 min    Activity Tolerance Patient tolerated treatment well    Behavior During Therapy WFL for tasks assessed/performed              Past Medical History:  Diagnosis Date   Abnormal mammogram 08/28/2014   08/28/14 - Probable benign microcalcifications over the outer mid to lower left breast. Bi-Rads 3. F/U 6 months    Allergic rhinitis 06/22/2014   Arm pain, lateral, left 04/28/2016   Asthma    Breast cancer (HCC) 03/16/2022   Carpal tunnel syndrome 04/26/2006   Qualifier: Diagnosis of  By: Benjamine MD, Aaron     Femur fracture [facility]) 05/14/2021   Femur fracture, right (HCC) 05/14/2021   First degree AV block 09/22/2013   EKG 09/22/13    GANGLION CYST, WRIST, RIGHT 10/06/2008   Qualifier: Diagnosis of  By: Lucille  MD, Taineisha     Hypothyroidism    Imbalance 01/12/2017   Palpitations 09/22/2013   Right wrist pain 04/28/2016   Past Surgical History:  Procedure Laterality Date   ABDOMINAL HYSTERECTOMY     fibroids   BREAST BIOPSY Left 01/24/2022   MM LT BREAST BX W LOC DEV 1ST LESION IMAGE BX SPEC STEREO GUIDE 01/24/2022 [facility]MO[facility]   BREAST BIOPSY  03/15/2022   MM LT RADIOACTIVE SEED LOC MAMMO GUIDE 03/15/2022 [facility]AMMO[facility]   BREAST LUMPECTOMY WITH RADIOACTIVE SEED LOCALIZATION Left 03/16/2022   Procedure: LEFT BREAST LUMPECTOMY WITH RADIOACTIVE SEED LOCALIZATION;  Surgeon: Vanderbilt Ned, MD;  Location: [facility];  Service: General;  Laterality: Left;   FEMUR IM NAIL Right 05/14/2021   Procedure: INTRAMEDULLARY (IM) RETROGRADE FEMORAL NAILING;   Surgeon: Beuford Anes, MD;  Location: [facility];  Service: Orthopedics;  Laterality: Right;   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   Normocytic anemia 07/12/2021   History of femur fracture 07/11/2021   Seasonal allergies 04/08/2021   Restrictive lung disease 03/25/2021   Prediabetes 03/25/2021   Thoracic aortic ectasia (HCC) 09/07/2020   Congestive heart failure (HCC) 08/03/2020   Esophageal reflux 08/14/2017   Vitamin D  deficiency 01/12/2017   Cataract 09/19/2013   Hypothyroidism 04/26/2006   Hyperlipidemia 04/26/2006   BMI 45.0-49.9, adult (HCC) 04/26/2006   Major depressive disorder, recurrent episode (HCC) 04/26/2006   HYPERTENSION, BENIGN SYSTEMIC 04/26/2006    PCP: Anders Otto DASEN, MD  REFERRING PROVIDER: Anders Otto DASEN, MD  REFERRING DIAG:  M54.16 (ICD-10-CM) - Lumbar radiculopathy M25.551 (ICD-10-CM) - Right hip pain  Rationale for Evaluation and Treatment: Rehabilitation  THERAPY DIAG:  Pain in right leg  Pain in left leg  Muscle weakness (generalized)  Other abnormalities of gait and mobility  ONSET DATE: Chronic  SUBJECTIVE:  SUBJECTIVE STATEMENT: Pt reports significant benefit from aquatic therapy.  She report min pain today.   PERTINENT HISTORY:  L femur fx, HTN, CHF, breast cancer  PAIN:  Are you having pain?  Yes: NPRS scale: 1-2/10 Worst: 10/10 Pain location: bilateral LE (posterior/lateral hips) Pain description: sharp, numb Aggravating factors: walking,  standing Relieving factors: medication   PRECAUTIONS: None  RED FLAGS: None   WEIGHT BEARING RESTRICTIONS: No  FALLS:  Has patient fallen in last 6 months? No  LIVING ENVIRONMENT: Lives with: lives with their family Lives in: House/apartment Stairs: Yes: External: 4 steps; on right going up Has following equipment at home: None  OCCUPATION: Retired Designer, industrial/product  PLOF: Independent  PATIENT GOALS: improve standing tolerance, decrease pain  NEXT MD VISIT: 08/14/2023  OBJECTIVE:  Note: Objective measures were completed at Evaluation unless otherwise noted.  DIAGNOSTIC FINDINGS:  See imaging for recent lumbar MRI  PATIENT SURVEYS:   LEFS: 33/80  COGNITION: Overall cognitive status: Within functional limits for tasks assessed     SENSATION: Light touch: Impaired - anterior shin R  MUSCLE LENGTH: Gillispie test: Right (+); Left (+)  POSTURE: rounded shoulders, forward head, increased lumbar lordosis, and large body habitus   PALPATION: TTP to bilateral posterior/lateral hip  LOWER EXTREMITY ROM:     Active  Right eval Left eval  Hip flexion    Hip extension    Hip abduction    Hip adduction    Hip internal rotation    Hip external rotation    Knee flexion [facility] WFL  Knee extension    Ankle dorsiflexion    Ankle plantarflexion    Ankle inversion    Ankle eversion     (Blank rows = not tested)  LOWER EXTREMITY MMT:    MMT Right eval Left eval  Hip flexion 4 3+  Hip extension    Hip abduction 3+ 3+  Hip adduction    Hip internal rotation    Hip external rotation    Knee flexion 4 4  Knee extension 4 4  Ankle dorsiflexion    Ankle plantarflexion    Ankle inversion    Ankle eversion     (Blank rows = not tested)  LUMBAR SPECIAL TESTS:  Straight leg raise test: Negative and Slump test: Negative  FUNCTIONAL TESTS:  30 Second Sit to Stand: 4 reps with UE  GAIT: Distance walked: 44ft Assistive device utilized: None Level of assistance:  SBA Comments: flexed trunk, decreased gait speed  TREATMENT:  [facility] Adult PT Treatment:                                                DATE: 08/21/23 Nu-step L5 x 5 min UE/LE for functional activity tolerance Supine SLR 2x10 ea Side lying clamshell 2x15 blue band Bridge on ball - 3x5 with max hip ext LTR x 5 LAQ 2x10 5# STS 3x5 no UE high table Pt education: benefits of sleep and sleep hygiene   TREATMENT 08/09/23:  Aquatic therapy at [facility]- [address] - therapeutic pool temp 92 degrees Pt enters building independently.  Treatment took place in water 3.8 to  4 ft 8 in.feet deep depending upon activity.  Pt entered and exited the pool via stair and handrails    Aquatic Therapy:  Water walking for warm up fwd w/ yellow noodle  x15 reps ea  Hip ext EOP Hip abd EOP Squat UE support HS curl Heel raises Hip abd circles Blue noodle stomp  Lateral walking UE support  Pt requires the buoyancy of water for active assisted exercises with buoyancy supported for strengthening and AROM exercises. Hydrostatic pressure also supports joints by unweighting joint load by at least 50 % in 3-4 feet depth water. 80% in chest to neck deep water. Water will provide assistance with movement using the current and laminar flow while the buoyancy reduces weight bearing. Pt requires the viscosity of the water for resistance with strengthening exercises.   [facility] Adult PT Treatment:                                                DATE: 08/07/23 Nu-step L5 x 5 min UE/LE for functional activity tolerance Supine SLR 2x10 ea Hooklying clamshell 2x15 blue band Hooklying ball squeeze 2x10 - 5 hold Bridge with ball x 10  LTR x 5 LAQ 2x10 5# STS 3x5 no UE high table Standing hip abd/ext x 10 ea  [facility] Adult PT Treatment:                                                DATE: 07/31/23 Nu-step L5 x 5 min UE/LE Toe taps on cone one side only 2 x 10 with 1 HHA performed bil Standing hip abduction 2 x 10  with hands on table  Sit to stand from hi-lo table with table raised each set 2 x 5.  Ice pack x 6 min for L knee in supine  [facility] Adult PT Treatment:                                                DATE: 07/24/23 MTPR along the L piriformis using tennis ball Seated piriformis stretch LLE 2 x 30 sec Bridge 2 x 10  Sustained hip abduction 2 x 10 with Blue theraband UE ball press 2 x 10 holding 2 sec ea. Log rolling supine> sit position Sit to stand 2 x 5 and reviewed mechanics.  Nu-step l5 x 5 min UE/LE Updated HEP for sit to stand  PATIENT EDUCATION:  Education details: eval findings, ODI, HEP, POC Person educated: Patient Education method: Explanation, Demonstration, and Handouts Education comprehension: verbalized understanding and returned demonstration  HOME EXERCISE PROGRAM: Access Code: [email or web address] URL: [email or web address] Date: 07/24/2023 Prepared by: Joneen Fresh  Exercises - Supine Quadricep Sets  - 1 x daily - 7 x weekly - 2 sets - 10 reps - 5 sec hold - Active Straight Leg Raise with Quad Set  - 1 x daily - 7 x weekly - 2 sets - 10 reps - Hooklying Clamshell with Resistance  - 1 x daily - 7 x weekly - 2-3 sets - 15 reps - green band hold - Beginner Bridge  - 1 x daily - 7 x weekly - 2 sets - 10 reps - Sit to Stand with Hands on Knees  - 1 x daily - 7 x weekly - 2 sets - 10 reps  ASSESSMENT:  CLINICAL IMPRESSION: Annalynn tolerated session  well with no adverse reaction.  She reports no increase in pain with exercises today but does reports significant muscular fatigue.  She has only be semi-compliant with her HEP and I encouraged her to complete this more often, particularly STS.  Discussed benefits of consistent sleep.   Evaluation: Patient is a 68 y.o. F who was seen today for physical therapy evaluation and treatment for chronic LE pain with occasional LBP. Physical findings are consistent with MD impression as pt demonstrates decrease in core  and LE strength and functional mobility. LEFS score shows severe disability in performance of home ADLs and community activities. Pt would benefit from skilled PT services working on improving strength, activity tolerance, and mobility while decreasing pain.    OBJECTIVE IMPAIRMENTS: Abnormal gait, decreased activity tolerance, decreased endurance, decreased mobility, difficulty walking, decreased ROM, decreased strength, and pain  ACTIVITY LIMITATIONS: carrying, lifting, standing, squatting, stairs, transfers, and locomotion level  PARTICIPATION LIMITATIONS: meal prep, cleaning, driving, shopping, community activity, occupation, and yard work  PERSONAL FACTORS: Time since onset of injury/illness/exacerbation and 3+ comorbidities: L femur fx, HTN, CHF, breast cancer are also affecting patient's functional outcome.   REHAB POTENTIAL: Good  CLINICAL DECISION MAKING: Evolving/moderate complexity  EVALUATION COMPLEXITY: Moderate   GOALS: Goals reviewed with patient? No  SHORT TERM GOALS: Target date: 07/31/2023  Pt will be compliant and knowledgeable with initial HEP for improved comfort and carryover Baseline: initial HEP given Goal status: MET 07/31/23  2.  Pt will self report LE pain no greater than 7/10 for improved comfort and functional ability Baseline: 10/10 at worst Goal status: Partially met 07/31/23   LONG TERM GOALS: Target date: 09/04/2023   Pt will improve LEFS to no less than 40/80 as proxy for functional improvement with home ADLs and higher level community activity with home ADLs and community activities Baseline: 33/80 Goal status: INITIAL   2.  Pt will self report LE pain no greater than 3/10 for improved comfort and functional ability Baseline: 10/10 at worst Goal status: INITIAL   3.  Pt will increase 30 Second Sit to Stand rep count to no less than 8 reps for improved balance, strength, and functional mobility Baseline: 4 reps with UE Goal status: INITIAL   4.   Pt will improve standing activity tolerance to no less than 20 minutes for improved comfort and functional ability with shopping and church activities Baseline: 4-5 minute Goal status: INITIAL  5.  Pt will improve all LE MMT to no less than 4/5 for improved functional mobility and decrease pain Baseline:  Goal status: INITIAL  PLAN:  PT FREQUENCY: 2x/week  PT DURATION: 8 weeks  PLANNED INTERVENTIONS: 97164- PT Re-evaluation, 97110-Therapeutic exercises, 97530- Therapeutic activity, [id number]- Neuromuscular re-education, 97535- Self Care, [id number]- Manual therapy, [id number]- Gait training, [id number]- Aquatic Therapy, H9716- Electrical stimulation (unattended), [id number]- Electrical stimulation (manual), 97016- Vasopneumatic device, Cryotherapy, and Moist heat.  PLAN FOR NEXT SESSION: assess HEP response, strengthening in gym and aquatic environments, standing activity tolerances   Helene FORBES Gasmen PT 08/21/23  12:34 PM

## 2023-08-22 ENCOUNTER — Other Ambulatory Visit: Payer: Self-pay

## 2023-08-22 NOTE — Patient Instructions (Signed)
 Visit Information  Thank you for taking time to visit with me today. Please don't hesitate to contact me if I can be of assistance to you before our next scheduled appointment.  Our next appointment is no further scheduled appointments.   Please call the care guide team at [phone] if you need to cancel or reschedule your appointment.   Following is a copy of your care plan:   Goals Addressed             This Visit's Progress    COMPLETED: VBCI RN Care Plan   On track    Problems:  Care Coordination needs related to Financial Strain   Goal: Over the next 30 days the Patient will take all medications exactly as prescribed and will call provider for medication related questions as evidenced by patient report of medication adherence    work with Child psychotherapist to address Financial constraints related to transportation, food, utilities related to the management of chronic conditions as evidenced by review of electronic medical record and patient or social worker report     Continue to work with PT  Interventions:   Evaluation of current treatment plan related to Financial constraints related to transportation, food, utilities self-management and patient's adherence to plan as established by provider. Discussed plans with patient for ongoing care management follow up and provided patient with direct contact information for care management team Advised patient to start using Advair  as ordered and contact pharmacy for refill of Albuterol   Reviewed medications with patient and discussed inhaler indications, rinse mouth out after using Advair  Discussed plans with patient for ongoing care management follow up and provided patient with direct contact information for care management team Screening for signs and symptoms of depression related to chronic disease state  Advised patient to contact insurance to ask about in-network dentist and eye doctor, as well as OTC benefits for scale. Educated  on why a scale is recommended for patients with CHF  Patient Self-Care Activities:  Attend all scheduled provider appointments Call provider office for new concerns or questions  Perform all self care activities independently  Take medications as prescribed   Work with the social worker to address care coordination needs and will continue to work with the clinical team to address health care and disease management related needs  Plan:  No further follow up required: No RNCM needs identified at this time. Patient will continue to work with BSW regarding SDOH needs. Provided patient with RNCM contact number should needs arise.             Please call the Suicide and Crisis Lifeline: 988 call 1-800-273-TALK (toll free, 24 hour hotline) if you are experiencing a Mental Health or Behavioral Health Crisis or need someone to talk to.  Patient verbalizes understanding of instructions and care plan provided today and agrees to view in MyChart. Active MyChart status and patient understanding of how to access instructions and care plan via MyChart confirmed with patient.     Rosaline Finlay, RN MSN [place]  [facility] Population Health RN Care Manager Direct Dial: [phone]  Fax: [phone]

## 2023-08-22 NOTE — Patient Outreach (Signed)
 Complex Care Management   Visit Note  08/22/2023  Name:  Andrea [name] MRN: [id number] DOB: 1955/10/08  Situation: Referral received for Complex Care Management related to SDOH Barriers:  Transportation Food insecurity Utility insecurity I obtained verbal consent from Patient.  Visit completed with Andrea [name]  on the phone  Background:   Past Medical History:  Diagnosis Date   Abnormal mammogram 08/28/2014   08/28/14 - Probable benign microcalcifications over the outer mid to lower left breast. Bi-Rads 3. F/U 6 months    Allergic rhinitis 06/22/2014   Arm pain, lateral, left 04/28/2016   Asthma    Breast cancer (HCC) 03/16/2022   Carpal tunnel syndrome 04/26/2006   Qualifier: Diagnosis of  By: Benjamine MD, Aaron     Femur fracture [facility]) 05/14/2021   Femur fracture, right (HCC) 05/14/2021   First degree AV block 09/22/2013   EKG 09/22/13    GANGLION CYST, WRIST, RIGHT 10/06/2008   Qualifier: Diagnosis of  By: Lucille  MD, Taineisha     Hypothyroidism    Imbalance 01/12/2017   Palpitations 09/22/2013   Right wrist pain 04/28/2016    Assessment: Patient Reported Symptoms:  Cognitive Cognitive Status: Able to follow simple commands, Alert and oriented to person, place, and time, Normal speech and language skills Cognitive/Intellectual Conditions Management [RPT]: None reported or documented in medical history or problem list      Neurological Neurological Review of Symptoms: No symptoms reported    HEENT HEENT Symptoms Reported: No symptoms reported HEENT Conditions: Vision problem(s) Vision Problems: blindness/vision loss (Wears glasses) HEENT Comment: Patient reports she needs to set an appointment with dentist and eye doctor. No immediate concerns. Plans to call insurance to ask about in-network providers. Vision problem(s)  Cardiovascular Cardiovascular Symptoms Reported: No symptoms reported Does patient have uncontrolled Hypertension?: No (confirmed patient  does have BP cuff) Cardiovascular Conditions: Hypertension, Heart failure, High blood cholesterol Cardiovascular Management Strategies: Medication therapy, Routine screening Cardiovascular Comment: Patient reports she does not know what happened to her scale. Advised that she contact insurance to ask about OTC benefits for scale.  Respiratory Respiratory Symptoms Reported: No symptoms reported Respiratory Conditions:  (Restrictive lung disease) Respiratory Comment: Note per last PCP visit that patient has not been taking inhalers. She reports she has not been taking Advair  as ordered, but does have inhaler. She does not have Albuterol  inhaler. Advised that she begin taking Advair  inhaler as ordered and contact pharmacy for refill of Albuterol . Reviewed inhaler indications/uses.  Endocrine Patient reports the following symptoms related to hypoglycemia or hyperglycemia : No symptoms reported Is patient diabetic?: No    Gastrointestinal Gastrointestinal Symptoms Reported: No symptoms reported (Last BM yesterday) Additional Gastrointestinal Details: Patient reports her appetite is good Gastrointestinal Conditions: Reflux/heartburn Gastrointestinal Management Strategies: Medication therapy Nutrition Risk Screen (CP): No indicators present  Genitourinary Genitourinary Symptoms Reported: No symptoms reported Genitourinary Conditions: Chronic kidney disease (CKD 3a)  Integumentary Integumentary Symptoms Reported: No symptoms reported    Musculoskeletal Musculoskelatal Symptoms Reviewed: Unsteady gait Musculoskeletal Conditions: Joint pain (R hip pain) Musculoskeletal Management Strategies: Exercise Musculoskeletal Comment: Patient is working with outpatient PT for R hip pain. She reports she is on the waiting list for water therapy. Falls in the past year?: No Number of falls in past year: 1 or less Was there an injury with Fall?: No Fall Risk Category Calculator: 0 Patient Fall Risk Level: Low  Fall Risk Patient at Risk for Falls Due to: No Fall Risks Fall risk Follow up: Falls evaluation completed,  Education provided, Falls prevention discussed  Psychosocial Psychosocial Symptoms Reported: No symptoms reported     Quality of Family Relationships: helpful, involved, supportive Do you feel physically threatened by others?: No      08/22/2023    2:40 PM  Depression screen PHQ 2/9  Decreased Interest 0  Down, Depressed, Hopeless 0  PHQ - 2 Score 0    There were no vitals filed for this visit.  Medications Reviewed Today     Reviewed by Arno Rosaline SQUIBB, RN (Registered Nurse) on 08/22/23 at 1422  Med List Status: <None>   Medication Order Taking? Sig Documenting Provider Last Dose Status Informant  albuterol  (VENTOLIN  HFA) 108 (90 Base) MCG/ACT inhaler [id number] Yes Inhale 2 puffs into the lungs every 6 (six) hours as needed for wheezing or shortness of breath. Anders Otto DASEN, MD  Active   amLODipine  (NORVASC ) 5 MG tablet [id number] Yes Take 1 tablet (5 mg total) by mouth at bedtime. Anders Otto DASEN, MD  Active   anastrozole  (ARIMIDEX ) 1 MG tablet [id number] Yes Take 1 tablet (1 mg total) by mouth daily. Iruku, Praveena, MD  Active   carvedilol  (COREG ) 6.25 MG tablet [id number] Yes TAKE 1 TABLET BY MOUTH TWICE DAILY WITH A MEAL Anders Otto T, MD  Active   famotidine  (PEPCID ) 20 MG tablet [id number] Yes TAKE 1 TABLET BY MOUTH ONCE DAILY AS NEEDED HEARTBURN  OR  INDIGESTION Anders Otto T, MD  Active   fenoprofen (NALFON) 600 MG TABS tablet [id number]  Take 1,200 mg by mouth.  Patient not taking: Reported on 08/22/2023   [provider]  Active   ferrous sulfate  325 (65 FE) MG tablet [id number]  Take 1 tablet (325 mg total) by mouth daily with breakfast.  Patient not taking: Reported on 08/22/2023   Regalado, Belkys A, MD  Active            Med Note WESS HARLENE LELON [place] Mar 09, 2022  2:34 PM) Not taking   fluticasone -salmeterol (ADVAIR  HFA) 115-21  MCG/ACT inhaler [id number]  Inhale 2 puffs into the lungs 2 (two) times daily.  Patient not taking: Reported on 08/14/2023   Anders Otto DASEN, MD  Active   gabapentin  (NEURONTIN ) 100 MG capsule [id number]  Take 1 capsule by mouth twice daily Anders Otto T, MD  Active   levothyroxine  (SYNTHROID ) 125 MCG tablet [id number]  TAKE 1 TABLET BY MOUTH ONCE DAILY BEFORE BREAKFAST Anders Otto DASEN, MD  Active   Melatonin 5 MG CHEW [id number]  Chew 5 mg by mouth at bedtime as needed. Anders Otto DASEN, MD  Active   Omega-3 Fatty Acids (FISH OIL ) 1000 MG CAPS [id number]  Take 1 capsule (1,000 mg total) by mouth in the morning and at bedtime.  Patient not taking: Reported on 08/14/2023   Anders Otto DASEN, MD  Active Self  polyethylene glycol (MIRALAX  / GLYCOLAX ) 17 g packet [id number]  Take 17 g by mouth 2 (two) times daily.  Patient not taking: Reported on 08/14/2023   Regalado, Belkys A, MD  Active   rosuvastatin  (CRESTOR ) 10 MG tablet [id number]  Take 1 tablet (10 mg total) by mouth daily.  Patient not taking: Reported on 08/14/2023   Anders Otto DASEN, MD  Active   sertraline  (ZOLOFT ) 50 MG tablet [id number]  Take 2 tablets by mouth once daily Anders Otto T, MD  Active   Vitamin D , Cholecalciferol, 50 MCG (2000 UT) CAPS [id number]  Take 2,000 Units by mouth daily. [provider]  Active Self            Recommendation:   Continue Current Plan of Care Contact your insurance to ask about in-network dentist and eye doctor, as well as OTC benefits for scale  Follow Up Plan:   No RNCM needs identified at this time. Patient will continue to work with BSW regarding SDOH needs. Provided patient with RNCM contact number should needs arise  Rosaline Finlay, RN MSN [place]  [facility] RN Care Manager Direct Dial: [phone]  Fax: [phone]

## 2023-08-23 ENCOUNTER — Ambulatory Visit

## 2023-08-23 DIAGNOSIS — R2689 Other abnormalities of gait and mobility: Secondary | ICD-10-CM | POA: Diagnosis not present

## 2023-08-23 DIAGNOSIS — M6281 Muscle weakness (generalized): Secondary | ICD-10-CM | POA: Diagnosis not present

## 2023-08-23 DIAGNOSIS — M79604 Pain in right leg: Secondary | ICD-10-CM

## 2023-08-23 DIAGNOSIS — M79605 Pain in left leg: Secondary | ICD-10-CM

## 2023-08-23 NOTE — Therapy (Signed)
 OUTPATIENT PHYSICAL THERAPY TREATMENT   Patient Name: Andrea [name] MRN: [id number] DOB:[birth year], 68 y.o., female Today's Date: 08/23/2023  END OF SESSION:  PT End of Session - 08/23/23 1137     Visit Number 7    Number of Visits 17    Date for PT Re-Evaluation 10/04/23    Authorization Type [employer] Dual Complete    PT Start Time 1140    PT Stop Time 1220    PT Time Calculation (min) 40 min    Activity Tolerance Patient tolerated treatment well    Behavior During Therapy WFL for tasks assessed/performed               Past Medical History:  Diagnosis Date   Abnormal mammogram 08/28/2014   08/28/14 - Probable benign microcalcifications over the outer mid to lower left breast. Bi-Rads 3. F/U 6 months    Allergic rhinitis 06/22/2014   Arm pain, lateral, left 04/28/2016   Asthma    Breast cancer (HCC) 03/16/2022   Carpal tunnel syndrome 04/26/2006   Qualifier: Diagnosis of  By: Benjamine MD, Aaron     Femur fracture [facility]) 05/14/2021   Femur fracture, right (HCC) 05/14/2021   First degree AV block 09/22/2013   EKG 09/22/13    GANGLION CYST, WRIST, RIGHT 10/06/2008   Qualifier: Diagnosis of  By: Lucille  MD, Taineisha     Hypothyroidism    Imbalance 01/12/2017   Palpitations 09/22/2013   Right wrist pain 04/28/2016   Past Surgical History:  Procedure Laterality Date   ABDOMINAL HYSTERECTOMY     fibroids   BREAST BIOPSY Left 01/24/2022   MM LT BREAST BX W LOC DEV 1ST LESION IMAGE BX SPEC STEREO GUIDE 01/24/2022 [facility]   BREAST BIOPSY  03/15/2022   MM LT RADIOACTIVE SEED LOC MAMMO GUIDE 03/15/2022 [facility]   BREAST LUMPECTOMY WITH RADIOACTIVE SEED LOCALIZATION Left 03/16/2022   Procedure: LEFT BREAST LUMPECTOMY WITH RADIOACTIVE SEED LOCALIZATION;  Surgeon: Vanderbilt Ned, MD;  Location: [facility];  Service: General;  Laterality: Left;   FEMUR IM NAIL Right 05/14/2021   Procedure: INTRAMEDULLARY (IM) RETROGRADE FEMORAL NAILING;   Surgeon: Beuford Anes, MD;  Location: [facility];  Service: Orthopedics;  Laterality: Right;   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   Normocytic anemia 07/12/2021   History of femur fracture 07/11/2021   Seasonal allergies 04/08/2021   Restrictive lung disease 03/25/2021   Prediabetes 03/25/2021   Thoracic aortic ectasia (HCC) 09/07/2020   Congestive heart failure (HCC) 08/03/2020   Esophageal reflux 08/14/2017   Vitamin D  deficiency 01/12/2017   Cataract 09/19/2013   Hypothyroidism 04/26/2006   Hyperlipidemia 04/26/2006   BMI 45.0-49.9, adult (HCC) 04/26/2006   Major depressive disorder, recurrent episode (HCC) 04/26/2006   HYPERTENSION, BENIGN SYSTEMIC 04/26/2006    PCP: Anders Otto DASEN, MD  REFERRING PROVIDER: Anders Otto DASEN, MD  REFERRING DIAG:  M54.16 (ICD-10-CM) - Lumbar radiculopathy M25.551 (ICD-10-CM) - Right hip pain  Rationale for Evaluation and Treatment: Rehabilitation  THERAPY DIAG:  Pain in right leg  Pain in left leg  Muscle weakness (generalized)  Other abnormalities of gait and mobility  ONSET DATE: Chronic  SUBJECTIVE:  SUBJECTIVE STATEMENT: Pt presents to PT with reports of muscle soreness after last session but no back or hip pain. Has been compliant with HEP.    PERTINENT HISTORY:  L femur fx, HTN, CHF, breast cancer  PAIN:  Are you having pain?  Yes: NPRS scale: 1-2/10 Worst: 10/10 Pain location: bilateral LE (posterior/lateral hips) Pain description: sharp,  numb Aggravating factors: walking, standing Relieving factors: medication   PRECAUTIONS: None  RED FLAGS: None   WEIGHT BEARING RESTRICTIONS: No  FALLS:  Has patient fallen in last 6 months? No  LIVING ENVIRONMENT: Lives with: lives with their family Lives in: House/apartment Stairs: Yes: External: 4 steps; on right going up Has following equipment at home: None  OCCUPATION: Retired Designer, industrial/product  PLOF: Independent  PATIENT GOALS: improve standing tolerance, decrease pain  NEXT MD VISIT: 08/14/2023  OBJECTIVE:  Note: Objective measures were completed at Evaluation unless otherwise noted.  DIAGNOSTIC FINDINGS:  See imaging for recent lumbar MRI  PATIENT SURVEYS:   LEFS: 33/80  COGNITION: Overall cognitive status: Within functional limits for tasks assessed     SENSATION: Light touch: Impaired - anterior shin R  MUSCLE LENGTH: Reinoso test: Right (+); Left (+)  POSTURE: rounded shoulders, forward head, increased lumbar lordosis, and large body habitus   PALPATION: TTP to bilateral posterior/lateral hip  LOWER EXTREMITY ROM:     Active  Right eval Left eval  Hip flexion    Hip extension    Hip abduction    Hip adduction    Hip internal rotation    Hip external rotation    Knee flexion [facility] WFL  Knee extension    Ankle dorsiflexion    Ankle plantarflexion    Ankle inversion    Ankle eversion     (Blank rows = not tested)  LOWER EXTREMITY MMT:    MMT Right eval Left eval  Hip flexion 4 3+  Hip extension    Hip abduction 3+ 3+  Hip adduction    Hip internal rotation    Hip external rotation    Knee flexion 4 4  Knee extension 4 4  Ankle dorsiflexion    Ankle plantarflexion    Ankle inversion    Ankle eversion     (Blank rows = not tested)  LUMBAR SPECIAL TESTS:  Straight leg raise test: Negative and Slump test: Negative  FUNCTIONAL TESTS:  30 Second Sit to Stand: 4 reps with UE  GAIT: Distance walked: 39ft Assistive device utilized:  None Level of assistance: SBA Comments: flexed trunk, decreased gait speed  TREATMENT: [facility] Adult PT Treatment:                                                DATE: 08/23/23 Nu-step L5 x 5 min UE/LE for functional activity tolerance LAQ 2x10 5# LTR x 10  Supine SLR 2x10 ea Hooklying clam 2x15 blue band STS 2x5  Standing hip abd/ext 2x10 ea Review of tests/measures, goals, and outcomes  PATIENT EDUCATION:  Education details: HEP updates and POC extension Person educated: Patient Education method: Explanation, Demonstration, and Handouts Education comprehension: verbalized understanding and returned demonstration  HOME EXERCISE PROGRAM: Access Code: [email or web address] URL: [email or web address] Date: 08/23/2023 Prepared by: Alm Kingdom  Exercises - Supine Quadricep Sets  - 1 x daily - 7 x weekly - 2 sets - 10 reps - 5 sec hold -  Active Straight Leg Raise with Quad Set  - 1 x daily - 7 x weekly - 2 sets - 10 reps - Hooklying Clamshell with Resistance  - 1 x daily - 7 x weekly - 2-3 sets - 15 reps - green band hold - Beginner Bridge  - 1 x daily - 7 x weekly - 2 sets - 10 reps - Sit to Stand with Hands on Knees  - 1 x daily - 7 x weekly - 2 sets - 10 reps - Standing Hip Abduction with Counter Support  - 1 x daily - 7 x weekly - 2 sets - 10 reps - Standing Hip Extension with Counter Support  - 1 x daily - 7 x weekly - 2 sets - 10 reps  ASSESSMENT:  CLINICAL IMPRESSION: Pt was able to complete prescribed exercises with no adverse effect, although she did struggle walking to lobby post session due to fatigue. PT focused on general LE strengthening and progressed standing proximal hip strength. POC extended as she has made some progress towards meeting LTGs. She continues to benefit from skilled PT, will continue per POC.   Evaluation: Patient is a 68 y.o. F who was seen today for physical therapy evaluation and treatment for chronic LE pain with occasional LBP. Physical  findings are consistent with MD impression as pt demonstrates decrease in core and LE strength and functional mobility. LEFS score shows severe disability in performance of home ADLs and community activities. Pt would benefit from skilled PT services working on improving strength, activity tolerance, and mobility while decreasing pain.    OBJECTIVE IMPAIRMENTS: Abnormal gait, decreased activity tolerance, decreased endurance, decreased mobility, difficulty walking, decreased ROM, decreased strength, and pain  ACTIVITY LIMITATIONS: carrying, lifting, standing, squatting, stairs, transfers, and locomotion level  PARTICIPATION LIMITATIONS: meal prep, cleaning, driving, shopping, community activity, occupation, and yard work  PERSONAL FACTORS: Time since onset of injury/illness/exacerbation and 3+ comorbidities: L femur fx, HTN, CHF, breast cancer are also affecting patient's functional outcome.   REHAB POTENTIAL: Good  CLINICAL DECISION MAKING: Evolving/moderate complexity  EVALUATION COMPLEXITY: Moderate   GOALS: Goals reviewed with patient? No  SHORT TERM GOALS: Target date: 07/31/2023  Pt will be compliant and knowledgeable with initial HEP for improved comfort and carryover Baseline: initial HEP given Goal status: MET 07/31/23  2.  Pt will self report LE pain no greater than 7/10 for improved comfort and functional ability Baseline: 10/10 at worst Goal status: Partially met 07/31/23   LONG TERM GOALS: Target date: 10/04/2023    Pt will improve LEFS to no less than 40/80 as proxy for functional improvement with home ADLs and higher level community activity with home ADLs and community activities Baseline: 33/80 08/23/2023: 44/80 Goal status: MET   2.  Pt will self report LE pain no greater than 3/10 for improved comfort and functional ability Baseline: 10/10 at worst Worst: 10/10 Goal status: IN PROGRESS   3.  Pt will increase 30 Second Sit to Stand rep count to no less than 8 reps  for improved balance, strength, and functional mobility Baseline: 4 reps with UE 08/23/2023: 6 reps no UE Goal status: IN PROGRESS   4.  Pt will improve standing activity tolerance to no less than 20 minutes for improved comfort and functional ability with shopping and church activities Baseline: 4-5 minutes 08/23/2023: 15-20 minutes Goal status: IN PROGRESS  5.  Pt will improve all LE MMT to no less than 4/5 for improved functional mobility and decrease pain Baseline:  Goal status: IN PROGRESS  PLAN:  PT FREQUENCY: 2x/week  PT DURATION: 8 weeks  PLANNED INTERVENTIONS: 97164- PT Re-evaluation, 97110-Therapeutic exercises, 97530- Therapeutic activity, [id number]- Neuromuscular re-education, 97535- Self Care, [id number]- Manual therapy, [id number]- Gait training, [phone]- Aquatic Therapy, [phone]- Electrical stimulation (unattended), [id number]- Electrical stimulation (manual), 97016- Vasopneumatic device, Cryotherapy, and Moist heat.  PLAN FOR NEXT SESSION: assess HEP response, strengthening in gym and aquatic environments, standing activity tolerances   Alm JAYSON Kingdom PT 08/23/23  1:06 PM

## 2023-08-24 ENCOUNTER — Encounter: Payer: Self-pay | Admitting: Family Medicine

## 2023-08-24 ENCOUNTER — Ambulatory Visit (INDEPENDENT_AMBULATORY_CARE_PROVIDER_SITE_OTHER): Admitting: Family Medicine

## 2023-08-24 VITALS — BP 114/89 | HR 79 | Ht 63.0 in | Wt 263.0 lb

## 2023-08-24 DIAGNOSIS — R748 Abnormal levels of other serum enzymes: Secondary | ICD-10-CM

## 2023-08-24 DIAGNOSIS — I10 Essential (primary) hypertension: Secondary | ICD-10-CM

## 2023-08-24 DIAGNOSIS — E785 Hyperlipidemia, unspecified: Secondary | ICD-10-CM | POA: Diagnosis not present

## 2023-08-24 MED ORDER — SERTRALINE HCL 50 MG PO TABS: 50.0000 mg | ORAL_TABLET | Freq: Every day | ORAL | Status: DC

## 2023-08-24 NOTE — Progress Notes (Signed)
    SUBJECTIVE:   CHIEF COMPLAINT / HPI:   Elevated Liver enzymes: Here for lab f/u. Denies GI symptoms. Feels well otherwise.   HLD: Off Fish oil  and Statins  HTN: Compliant with Coreg  6.25 mg BID and Amlodipine  5 mg QD.  PERTINENT  PMH / PSH: Pmhx reviewed  OBJECTIVE:   BP 114/89   Pulse 79   Ht 5' 3 (1.6 m)   Wt 263 lb (119.3 kg)   SpO2 99%   BMI 46.59 kg/m   Physical Exam Vitals and nursing note reviewed.   Cardiovascular:     Rate and Rhythm: Normal rate and regular rhythm.     Heart sounds: Normal heart sounds. No murmur heard. Pulmonary:     Effort: Pulmonary effort is normal. No respiratory distress.     Breath sounds: Normal breath sounds. No stridor. No wheezing or rhonchi.  Abdominal:     General: There is no distension.     Palpations: Abdomen is soft. There is no mass.     Tenderness: There is no abdominal tenderness. There is no guarding.      ASSESSMENT/PLAN:   Assessment & Plan Elevated liver enzymes  Differentials include fatty liver vs. pharmaco-agent RUQ- fatty liver. Viral hepatitis panel negative Lab remains elevated despite being off Statin and fish oil .  Other medications she is currently on which can remotely cause hepatitis include - Norvasc , Famotidine , and Zoloft . She endorsed taking Famotidine  daily. I advised to switch this medication to prn if possible Reduce Zoloft  from 100 mg QD to 50 mg QD Continue Amlodipine  for now for BP control F/U in 3-4 weeks for lab Will take Norvasc  if lab worsens Hyperlipidemia, unspecified hyperlipidemia type Off meds due to elevated liver enzymes Referral order to lipid clinic placed May benefit from Repatha  HYPERTENSION, BENIGN SYSTEMIC Stable on current regimen    Otto Fairly, MD [facility]

## 2023-08-24 NOTE — Assessment & Plan Note (Addendum)
  Differentials include fatty liver vs. pharmaco-agent RUQ- fatty liver. Viral hepatitis panel negative Lab remains elevated despite being off Statin and fish oil .  Other medications she is currently on which can remotely cause hepatitis include - Norvasc , Famotidine , and Zoloft . She endorsed taking Famotidine  daily. I advised to switch this medication to prn if possible Reduce Zoloft  from 100 mg QD to 50 mg QD Continue Amlodipine  for now for BP control F/U in 3-4 weeks for lab Will take Norvasc  if lab worsens

## 2023-08-24 NOTE — Patient Instructions (Signed)
  It was nice seeing you today. Your liver test is still up. This might be due to fatty liver. Treatment for that is weight loss.  It could also be due to medications. We will have you cut back on your Zoloft  to one tablet daily. Continue Amlodipine  for now given normal BP. Consider stopping this medication in the future if your liver enzyme is still up. Take Famotidine  only when needed. I will see you back in 2 weeks for Liver test. I sent you to Cholesterol clinic for lipid management. Continue to hold your Cholesterol medicatione till then.

## 2023-08-24 NOTE — Assessment & Plan Note (Addendum)
 Off meds due to elevated liver enzymes Referral order to lipid clinic placed May benefit from Repatha

## 2023-08-24 NOTE — Assessment & Plan Note (Addendum)
 Stable on current regimen

## 2023-08-28 ENCOUNTER — Ambulatory Visit: Attending: Family Medicine

## 2023-08-28 DIAGNOSIS — M79604 Pain in right leg: Secondary | ICD-10-CM | POA: Diagnosis not present

## 2023-08-28 DIAGNOSIS — M79605 Pain in left leg: Secondary | ICD-10-CM | POA: Insufficient documentation

## 2023-08-28 DIAGNOSIS — R2689 Other abnormalities of gait and mobility: Secondary | ICD-10-CM | POA: Insufficient documentation

## 2023-08-28 DIAGNOSIS — M6281 Muscle weakness (generalized): Secondary | ICD-10-CM | POA: Insufficient documentation

## 2023-08-28 NOTE — Therapy (Signed)
 OUTPATIENT PHYSICAL THERAPY TREATMENT   Patient Name: Andrea [name] MRN: [id number] DOB:[birth year], 68 y.o., female Today's Date: 08/28/2023  END OF SESSION:  PT End of Session - 08/28/23 1125     Visit Number 8    Number of Visits 17    Date for PT Re-Evaluation 10/04/23    Authorization Type [employer] Dual Complete    PT Start Time 1145    PT Stop Time 1223    PT Time Calculation (min) 38 min    Activity Tolerance Patient tolerated treatment well    Behavior During Therapy WFL for tasks assessed/performed                Past Medical History:  Diagnosis Date   Abnormal mammogram 08/28/2014   08/28/14 - Probable benign microcalcifications over the outer mid to lower left breast. Bi-Rads 3. F/U 6 months    Allergic rhinitis 06/22/2014   Arm pain, lateral, left 04/28/2016   Asthma    Breast cancer (HCC) 03/16/2022   Carpal tunnel syndrome 04/26/2006   Qualifier: Diagnosis of  By: Benjamine MD, Aaron     Femur fracture [facility]) 05/14/2021   Femur fracture, right (HCC) 05/14/2021   First degree AV block 09/22/2013   EKG 09/22/13    GANGLION CYST, WRIST, RIGHT 10/06/2008   Qualifier: Diagnosis of  By: Lucille  MD, Taineisha     Hypothyroidism    Imbalance 01/12/2017   Palpitations 09/22/2013   Right wrist pain 04/28/2016   Past Surgical History:  Procedure Laterality Date   ABDOMINAL HYSTERECTOMY     fibroids   BREAST BIOPSY Left 01/24/2022   MM LT BREAST BX W LOC DEV 1ST LESION IMAGE BX SPEC STEREO GUIDE 01/24/2022 [facility]   BREAST BIOPSY  03/15/2022   MM LT RADIOACTIVE SEED LOC MAMMO GUIDE 03/15/2022 [facility]   BREAST LUMPECTOMY WITH RADIOACTIVE SEED LOCALIZATION Left 03/16/2022   Procedure: LEFT BREAST LUMPECTOMY WITH RADIOACTIVE SEED LOCALIZATION;  Surgeon: Vanderbilt Ned, MD;  Location: [facility];  Service: General;  Laterality: Left;   FEMUR IM NAIL Right 05/14/2021   Procedure: INTRAMEDULLARY (IM) RETROGRADE FEMORAL NAILING;   Surgeon: Beuford Anes, MD;  Location: [facility];  Service: Orthopedics;  Laterality: Right;   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   Normocytic anemia 07/12/2021   History of femur fracture 07/11/2021   Seasonal allergies 04/08/2021   Restrictive lung disease 03/25/2021   Prediabetes 03/25/2021   Thoracic aortic ectasia (HCC) 09/07/2020   Congestive heart failure (HCC) 08/03/2020   Esophageal reflux 08/14/2017   Vitamin D  deficiency 01/12/2017   Cataract 09/19/2013   Hypothyroidism 04/26/2006   Hyperlipidemia 04/26/2006   BMI 45.0-49.9, adult (HCC) 04/26/2006   Major depressive disorder, recurrent episode (HCC) 04/26/2006   HYPERTENSION, BENIGN SYSTEMIC 04/26/2006    PCP: Anders Otto DASEN, MD  REFERRING PROVIDER: Anders Otto DASEN, MD  REFERRING DIAG:  M54.16 (ICD-10-CM) - Lumbar radiculopathy M25.551 (ICD-10-CM) - Right hip pain  Rationale for Evaluation and Treatment: Rehabilitation  THERAPY DIAG:  Pain in right leg  Pain in left leg  Muscle weakness (generalized)  Other abnormalities of gait and mobility  ONSET DATE: Chronic  SUBJECTIVE:  SUBJECTIVE STATEMENT: Pt presents to PT with no current reports of pain, feels that everything is improving.   PERTINENT HISTORY:  L femur fx, HTN, CHF, breast cancer  PAIN:  Are you having pain?  Yes: NPRS scale: 1-2/10 Worst: 10/10 Pain location: bilateral LE (posterior/lateral hips) Pain description: sharp, numb Aggravating factors: walking,  standing Relieving factors: medication   PRECAUTIONS: None  RED FLAGS: None   WEIGHT BEARING RESTRICTIONS: No  FALLS:  Has patient fallen in last 6 months? No  LIVING ENVIRONMENT: Lives with: lives with their family Lives in: House/apartment Stairs: Yes: External: 4 steps; on right going up Has following equipment at home: None  OCCUPATION: Retired Designer, industrial/product  PLOF: Independent  PATIENT GOALS: improve standing tolerance, decrease pain  NEXT MD VISIT: 08/14/2023  OBJECTIVE:  Note: Objective measures were completed at Evaluation unless otherwise noted.  DIAGNOSTIC FINDINGS:  See imaging for recent lumbar MRI  PATIENT SURVEYS:   LEFS: 33/80  COGNITION: Overall cognitive status: Within functional limits for tasks assessed     SENSATION: Light touch: Impaired - anterior shin R  MUSCLE LENGTH: [place]y test: Right (+); Left (+)  POSTURE: rounded shoulders, forward head, increased lumbar lordosis, and large body habitus   PALPATION: TTP to bilateral posterior/lateral hip  LOWER EXTREMITY ROM:     Active  Right eval Left eval  Hip flexion    Hip extension    Hip abduction    Hip adduction    Hip internal rotation    Hip external rotation    Knee flexion [facility] WFL  Knee extension    Ankle dorsiflexion    Ankle plantarflexion    Ankle inversion    Ankle eversion     (Blank rows = not tested)  LOWER EXTREMITY MMT:    MMT Right eval Left eval  Hip flexion 4 3+  Hip extension    Hip abduction 3+ 3+  Hip adduction    Hip internal rotation    Hip external rotation    Knee flexion 4 4  Knee extension 4 4  Ankle dorsiflexion    Ankle plantarflexion    Ankle inversion    Ankle eversion     (Blank rows = not tested)  LUMBAR SPECIAL TESTS:  Straight leg raise test: Negative and Slump test: Negative  FUNCTIONAL TESTS:  30 Second Sit to Stand: 4 reps with UE  GAIT: Distance walked: 20ft Assistive device utilized: None Level of assistance:  SBA Comments: flexed trunk, decreased gait speed  TREATMENT: [facility] Adult PT Treatment:                                                DATE: 08/28/23 Nu-step L5 x 4 min UE/LE for functional activity tolerance LAQ 2x10 5# STS 3x5  Supine QS x 10 - 5 hold Supine SLR 2x10 ea LTR x 10  Hooklying clam 2x15 blue band Hooklying marching 2x20 blue band   [facility] Adult PT Treatment:                                                DATE: 08/23/23 Nu-step L5 x 5 min UE/LE for functional activity tolerance LAQ 2x10 5# LTR x 10  Supine SLR 2x10 ea  Hooklying clam 2x15 blue band STS 2x5  Standing hip abd/ext 2x10 ea Review of tests/measures, goals, and outcomes  PATIENT EDUCATION:  Education details: HEP updates and POC extension Person educated: Patient Education method: Explanation, Demonstration, and Handouts Education comprehension: verbalized understanding and returned demonstration  HOME EXERCISE PROGRAM: Access Code: [email or web address] URL: [email or web address] Date: 08/23/2023 Prepared by: Alm Kingdom  Exercises - Supine Quadricep Sets  - 1 x daily - 7 x weekly - 2 sets - 10 reps - 5 sec hold - Active Straight Leg Raise with Quad Set  - 1 x daily - 7 x weekly - 2 sets - 10 reps - Hooklying Clamshell with Resistance  - 1 x daily - 7 x weekly - 2-3 sets - 15 reps - green band hold - Beginner Bridge  - 1 x daily - 7 x weekly - 2 sets - 10 reps - Sit to Stand with Hands on Knees  - 1 x daily - 7 x weekly - 2 sets - 10 reps - Standing Hip Abduction with Counter Support  - 1 x daily - 7 x weekly - 2 sets - 10 reps - Standing Hip Extension with Counter Support  - 1 x daily - 7 x weekly - 2 sets - 10 reps  ASSESSMENT:  CLINICAL IMPRESSION: Pt was able to complete prescribed exercises with no adverse effect. PT focused on general LE strengthening and progressed standing proximal hip strength.She continues to benefit from skilled PT, will continue per POC.   Evaluation: Patient is a  68 y.o. F who was seen today for physical therapy evaluation and treatment for chronic LE pain with occasional LBP. Physical findings are consistent with MD impression as pt demonstrates decrease in core and LE strength and functional mobility. LEFS score shows severe disability in performance of home ADLs and community activities. Pt would benefit from skilled PT services working on improving strength, activity tolerance, and mobility while decreasing pain.    OBJECTIVE IMPAIRMENTS: Abnormal gait, decreased activity tolerance, decreased endurance, decreased mobility, difficulty walking, decreased ROM, decreased strength, and pain  ACTIVITY LIMITATIONS: carrying, lifting, standing, squatting, stairs, transfers, and locomotion level  PARTICIPATION LIMITATIONS: meal prep, cleaning, driving, shopping, community activity, occupation, and yard work  PERSONAL FACTORS: Time since onset of injury/illness/exacerbation and 3+ comorbidities: L femur fx, HTN, CHF, breast cancer are also affecting patient's functional outcome.   REHAB POTENTIAL: Good  CLINICAL DECISION MAKING: Evolving/moderate complexity  EVALUATION COMPLEXITY: Moderate   GOALS: Goals reviewed with patient? No  SHORT TERM GOALS: Target date: 07/31/2023  Pt will be compliant and knowledgeable with initial HEP for improved comfort and carryover Baseline: initial HEP given Goal status: MET 07/31/23  2.  Pt will self report LE pain no greater than 7/10 for improved comfort and functional ability Baseline: 10/10 at worst Goal status: Partially met 07/31/23   LONG TERM GOALS: Target date: 10/04/2023    Pt will improve LEFS to no less than 40/80 as proxy for functional improvement with home ADLs and higher level community activity with home ADLs and community activities Baseline: 33/80 08/23/2023: 44/80 Goal status: MET   2.  Pt will self report LE pain no greater than 3/10 for improved comfort and functional ability Baseline: 10/10 at  worst Worst: 10/10 Goal status: IN PROGRESS   3.  Pt will increase 30 Second Sit to Stand rep count to no less than 8 reps for improved balance, strength, and functional mobility Baseline: 4 reps with UE 08/23/2023: 6 reps no UE  Goal status: IN PROGRESS   4.  Pt will improve standing activity tolerance to no less than 20 minutes for improved comfort and functional ability with shopping and church activities Baseline: 4-5 minutes 08/23/2023: 15-20 minutes Goal status: IN PROGRESS  5.  Pt will improve all LE MMT to no less than 4/5 for improved functional mobility and decrease pain Baseline:  Goal status: IN PROGRESS  PLAN:  PT FREQUENCY: 2x/week  PT DURATION: 8 weeks  PLANNED INTERVENTIONS: 97164- PT Re-evaluation, 97110-Therapeutic exercises, 97530- Therapeutic activity, [id number]- Neuromuscular re-education, 97535- Self Care, [id number]- Manual therapy, [id number]- Gait training, [id number]- Aquatic Therapy, H9716- Electrical stimulation (unattended), [id number]- Electrical stimulation (manual), 97016- Vasopneumatic device, Cryotherapy, and Moist heat.  PLAN FOR NEXT SESSION: assess HEP response, strengthening in gym and aquatic environments, standing activity tolerances   Alm JAYSON Kingdom PT 08/28/23  1:07 PM

## 2023-08-30 ENCOUNTER — Ambulatory Visit

## 2023-08-30 DIAGNOSIS — M79604 Pain in right leg: Secondary | ICD-10-CM | POA: Diagnosis not present

## 2023-08-30 DIAGNOSIS — M79605 Pain in left leg: Secondary | ICD-10-CM | POA: Diagnosis not present

## 2023-08-30 DIAGNOSIS — M6281 Muscle weakness (generalized): Secondary | ICD-10-CM

## 2023-08-30 DIAGNOSIS — R2689 Other abnormalities of gait and mobility: Secondary | ICD-10-CM

## 2023-08-30 NOTE — Therapy (Signed)
 OUTPATIENT PHYSICAL THERAPY TREATMENT   Patient Name: Andrea [name] MRN: [id number] DOB:07/17/1955, 68 y.o., female Today's Date: 08/30/2023  END OF SESSION:  PT End of Session - 08/30/23 1129     Visit Number 9    Number of Visits 17    Date for PT Re-Evaluation 10/04/23    Authorization Type [employer] Dual Complete    PT Start Time 1130    PT Stop Time 1208    PT Time Calculation (min) 38 min    Activity Tolerance Patient tolerated treatment well    Behavior During Therapy WFL for tasks assessed/performed                 Past Medical History:  Diagnosis Date   Abnormal mammogram 08/28/2014   08/28/14 - Probable benign microcalcifications over the outer mid to lower left breast. Bi-Rads 3. F/U 6 months    Allergic rhinitis 06/22/2014   Arm pain, lateral, left 04/28/2016   Asthma    Breast cancer (HCC) 03/16/2022   Carpal tunnel syndrome 04/26/2006   Qualifier: Diagnosis of  By: Benjamine MD, Aaron     Femur fracture [facility]) 05/14/2021   Femur fracture, right (HCC) 05/14/2021   First degree AV block 09/22/2013   EKG 09/22/13    GANGLION CYST, WRIST, RIGHT 10/06/2008   Qualifier: Diagnosis of  By: Lucille  MD, Taineisha     Hypothyroidism    Imbalance 01/12/2017   Palpitations 09/22/2013   Right wrist pain 04/28/2016   Past Surgical History:  Procedure Laterality Date   ABDOMINAL HYSTERECTOMY     fibroids   BREAST BIOPSY Left 01/24/2022   MM LT BREAST BX W LOC DEV 1ST LESION IMAGE BX SPEC STEREO GUIDE 01/24/2022 [facility]   BREAST BIOPSY  03/15/2022   MM LT RADIOACTIVE SEED LOC MAMMO GUIDE 03/15/2022 [facility]   BREAST LUMPECTOMY WITH RADIOACTIVE SEED LOCALIZATION Left 03/16/2022   Procedure: LEFT BREAST LUMPECTOMY WITH RADIOACTIVE SEED LOCALIZATION;  Surgeon: Vanderbilt Ned, MD;  Location: [facility];  Service: General;  Laterality: Left;   FEMUR IM NAIL Right 05/14/2021   Procedure: INTRAMEDULLARY (IM) RETROGRADE FEMORAL NAILING;   Surgeon: Beuford Anes, MD;  Location: [facility];  Service: Orthopedics;  Laterality: Right;   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   Normocytic anemia 07/12/2021   History of femur fracture 07/11/2021   Seasonal allergies 04/08/2021   Restrictive lung disease 03/25/2021   Prediabetes 03/25/2021   Thoracic aortic ectasia (HCC) 09/07/2020   Congestive heart failure (HCC) 08/03/2020   Esophageal reflux 08/14/2017   Vitamin D  deficiency 01/12/2017   Cataract 09/19/2013   Hypothyroidism 04/26/2006   Hyperlipidemia 04/26/2006   BMI 45.0-49.9, adult (HCC) 04/26/2006   Major depressive disorder, recurrent episode (HCC) 04/26/2006   HYPERTENSION, BENIGN SYSTEMIC 04/26/2006    PCP: Anders Otto DASEN, MD  REFERRING PROVIDER: Anders Otto DASEN, MD  REFERRING DIAG:  M54.16 (ICD-10-CM) - Lumbar radiculopathy M25.551 (ICD-10-CM) - Right hip pain  Rationale for Evaluation and Treatment: Rehabilitation  THERAPY DIAG:  Pain in right leg  Pain in left leg  Muscle weakness (generalized)  Other abnormalities of gait and mobility  ONSET DATE: Chronic  SUBJECTIVE:  SUBJECTIVE STATEMENT: Pt presents to PT with reports of no current pain. Yesterday she had a lot of pain in posterior LE.   PERTINENT HISTORY:  L femur fx, HTN, CHF, breast cancer  PAIN:  Are you having pain?  Yes: NPRS scale: 1-2/10 Worst: 10/10 Pain location: bilateral LE (posterior/lateral hips) Pain description: sharp, numb Aggravating  factors: walking, standing Relieving factors: medication   PRECAUTIONS: None  RED FLAGS: None   WEIGHT BEARING RESTRICTIONS: No  FALLS:  Has patient fallen in last 6 months? No  LIVING ENVIRONMENT: Lives with: lives with their family Lives in: House/apartment Stairs: Yes: External: 4 steps; on right going up Has following equipment at home: None  OCCUPATION: Retired Designer, industrial/product  PLOF: Independent  PATIENT GOALS: improve standing tolerance, decrease pain  NEXT MD VISIT: 08/14/2023  OBJECTIVE:  Note: Objective measures were completed at Evaluation unless otherwise noted.  DIAGNOSTIC FINDINGS:  See imaging for recent lumbar MRI  PATIENT SURVEYS:   LEFS: 33/80  COGNITION: Overall cognitive status: Within functional limits for tasks assessed     SENSATION: Light touch: Impaired - anterior shin R  MUSCLE LENGTH: [place] test: Right (+); Left (+)  POSTURE: rounded shoulders, forward head, increased lumbar lordosis, and large body habitus   PALPATION: TTP to bilateral posterior/lateral hip  LOWER EXTREMITY ROM:     Active  Right eval Left eval  Hip flexion    Hip extension    Hip abduction    Hip adduction    Hip internal rotation    Hip external rotation    Knee flexion [facility] WFL  Knee extension    Ankle dorsiflexion    Ankle plantarflexion    Ankle inversion    Ankle eversion     (Blank rows = not tested)  LOWER EXTREMITY MMT:    MMT Right eval Left eval  Hip flexion 4 3+  Hip extension    Hip abduction 3+ 3+  Hip adduction    Hip internal rotation    Hip external rotation    Knee flexion 4 4  Knee extension 4 4  Ankle dorsiflexion    Ankle plantarflexion    Ankle inversion    Ankle eversion     (Blank rows = not tested)  LUMBAR SPECIAL TESTS:  Straight leg raise test: Negative and Slump test: Negative  FUNCTIONAL TESTS:  30 Second Sit to Stand: 4 reps with UE  GAIT: Distance walked: 51ft Assistive device utilized: None Level of  assistance: SBA Comments: flexed trunk, decreased gait speed  TREATMENT: [facility] Adult PT Treatment:                                                DATE: 08/30/23 Nu-step L5 x 4 min UE/LE for functional activity tolerance LAQ 2x10 5# Seated hamstring curl 2x10 black band Supine SLR 2x10 ea LTR x 10  Hooklying clam 2x15 blue band Hooklying marching 2x20 blue band Standing mini squat with UE support 3x5  [facility] Adult PT Treatment:                                                DATE: 08/28/23 Nu-step L5 x 4 min UE/LE for functional activity tolerance LAQ 2x10 5# STS  3x5  Supine QS x 10 - 5 hold Supine SLR 2x10 ea LTR x 10  Hooklying clam 2x15 blue band Hooklying marching 2x20 blue band  [facility] Adult PT Treatment:                                                DATE: 08/23/23 Nu-step L5 x 5 min UE/LE for functional activity tolerance LAQ 2x10 5# LTR x 10  Supine SLR 2x10 ea Hooklying clam 2x15 blue band STS 2x5  Standing hip abd/ext 2x10 ea Review of tests/measures, goals, and outcomes  PATIENT EDUCATION:  Education details: HEP updates and POC extension Person educated: Patient Education method: Explanation, Demonstration, and Handouts Education comprehension: verbalized understanding and returned demonstration  HOME EXERCISE PROGRAM: Access Code: [email or web address] URL: [email or web address] Date: 08/23/2023 Prepared by: Alm Kingdom  Exercises - Supine Quadricep Sets  - 1 x daily - 7 x weekly - 2 sets - 10 reps - 5 sec hold - Active Straight Leg Raise with Quad Set  - 1 x daily - 7 x weekly - 2 sets - 10 reps - Hooklying Clamshell with Resistance  - 1 x daily - 7 x weekly - 2-3 sets - 15 reps - green band hold - Beginner Bridge  - 1 x daily - 7 x weekly - 2 sets - 10 reps - Sit to Stand with Hands on Knees  - 1 x daily - 7 x weekly - 2 sets - 10 reps - Standing Hip Abduction with Counter Support  - 1 x daily - 7 x weekly - 2 sets - 10 reps - Standing Hip Extension with  Counter Support  - 1 x daily - 7 x weekly - 2 sets - 10 reps  ASSESSMENT:  CLINICAL IMPRESSION: Pt was able to complete prescribed exercises with no adverse effect. PT focused on general LE strengthening and progressed standing proximal hip strength.She continues to benefit from skilled PT, will continue per POC.    Evaluation: Patient is a 68 y.o. F who was seen today for physical therapy evaluation and treatment for chronic LE pain with occasional LBP. Physical findings are consistent with MD impression as pt demonstrates decrease in core and LE strength and functional mobility. LEFS score shows severe disability in performance of home ADLs and community activities. Pt would benefit from skilled PT services working on improving strength, activity tolerance, and mobility while decreasing pain.    OBJECTIVE IMPAIRMENTS: Abnormal gait, decreased activity tolerance, decreased endurance, decreased mobility, difficulty walking, decreased ROM, decreased strength, and pain  ACTIVITY LIMITATIONS: carrying, lifting, standing, squatting, stairs, transfers, and locomotion level  PARTICIPATION LIMITATIONS: meal prep, cleaning, driving, shopping, community activity, occupation, and yard work  PERSONAL FACTORS: Time since onset of injury/illness/exacerbation and 3+ comorbidities: L femur fx, HTN, CHF, breast cancer are also affecting patient's functional outcome.   REHAB POTENTIAL: Good  CLINICAL DECISION MAKING: Evolving/moderate complexity  EVALUATION COMPLEXITY: Moderate   GOALS: Goals reviewed with patient? No  SHORT TERM GOALS: Target date: 07/31/2023  Pt will be compliant and knowledgeable with initial HEP for improved comfort and carryover Baseline: initial HEP given Goal status: MET 07/31/23  2.  Pt will self report LE pain no greater than 7/10 for improved comfort and functional ability Baseline: 10/10 at worst Goal status: Partially met 07/31/23   LONG TERM GOALS: Target date: 10/04/2023  Pt will improve LEFS to no less than 40/80 as proxy for functional improvement with home ADLs and higher level community activity with home ADLs and community activities Baseline: 33/80 08/23/2023: 44/80 Goal status: MET   2.  Pt will self report LE pain no greater than 3/10 for improved comfort and functional ability Baseline: 10/10 at worst Worst: 10/10 Goal status: IN PROGRESS   3.  Pt will increase 30 Second Sit to Stand rep count to no less than 8 reps for improved balance, strength, and functional mobility Baseline: 4 reps with UE 08/23/2023: 6 reps no UE Goal status: IN PROGRESS   4.  Pt will improve standing activity tolerance to no less than 20 minutes for improved comfort and functional ability with shopping and church activities Baseline: 4-5 minutes 08/23/2023: 15-20 minutes Goal status: IN PROGRESS  5.  Pt will improve all LE MMT to no less than 4/5 for improved functional mobility and decrease pain Baseline:  Goal status: IN PROGRESS  PLAN:  PT FREQUENCY: 2x/week  PT DURATION: 8 weeks  PLANNED INTERVENTIONS: 97164- PT Re-evaluation, 97110-Therapeutic exercises, 97530- Therapeutic activity, [id number]- Neuromuscular re-education, 97535- Self Care, 02859- Manual therapy, [id number]- Gait training, [id number]- Aquatic Therapy, H9716- Electrical stimulation (unattended), [id number]- Electrical stimulation (manual), 97016- Vasopneumatic device, Cryotherapy, and Moist heat.  PLAN FOR NEXT SESSION: assess HEP response, strengthening in gym and aquatic environments, standing activity tolerances   Alm JAYSON Kingdom PT 08/30/23  1:02 PM

## 2023-09-03 ENCOUNTER — Telehealth: Payer: Self-pay

## 2023-09-04 ENCOUNTER — Ambulatory Visit

## 2023-09-06 ENCOUNTER — Ambulatory Visit

## 2023-09-06 DIAGNOSIS — M79605 Pain in left leg: Secondary | ICD-10-CM | POA: Diagnosis not present

## 2023-09-06 DIAGNOSIS — M6281 Muscle weakness (generalized): Secondary | ICD-10-CM | POA: Diagnosis not present

## 2023-09-06 DIAGNOSIS — R2689 Other abnormalities of gait and mobility: Secondary | ICD-10-CM | POA: Diagnosis not present

## 2023-09-06 DIAGNOSIS — M79604 Pain in right leg: Secondary | ICD-10-CM

## 2023-09-06 NOTE — Therapy (Signed)
 OUTPATIENT PHYSICAL THERAPY TREATMENT/PROGRESS NOTE  Progress Note Reporting Period 07/10/2023 to 09/06/2023  See note below for Objective Data and Assessment of Progress/Goals.   Patient Name: Andrea [name] MRN: [id number] DOB:1955-04-30, 68 y.o., female Today's Date: 09/06/2023  END OF SESSION:  PT End of Session - 09/06/23 1054     Visit Number 10    Number of Visits 17    Date for PT Re-Evaluation 10/04/23    Authorization Type [employer] Dual Complete    PT Start Time 1056    PT Stop Time 1136    PT Time Calculation (min) 40 min    Activity Tolerance Patient tolerated treatment well    Behavior During Therapy WFL for tasks assessed/performed                  Past Medical History:  Diagnosis Date   Abnormal mammogram 08/28/2014   08/28/14 - Probable benign microcalcifications over the outer mid to lower left breast. Bi-Rads 3. F/U 6 months    Allergic rhinitis 06/22/2014   Arm pain, lateral, left 04/28/2016   Asthma    Breast cancer (HCC) 03/16/2022   Carpal tunnel syndrome 04/26/2006   Qualifier: Diagnosis of  By: Benjamine MD, Aaron     Femur fracture [facility]) 05/14/2021   Femur fracture, right (HCC) 05/14/2021   First degree AV block 09/22/2013   EKG 09/22/13    GANGLION CYST, WRIST, RIGHT 10/06/2008   Qualifier: Diagnosis of  By: Lucille  MD, Taineisha     Hypothyroidism    Imbalance 01/12/2017   Palpitations 09/22/2013   Right wrist pain 04/28/2016   Past Surgical History:  Procedure Laterality Date   ABDOMINAL HYSTERECTOMY     fibroids   BREAST BIOPSY Left 01/24/2022   MM LT BREAST BX W LOC DEV 1ST LESION IMAGE BX SPEC STEREO GUIDE 01/24/2022 [facility]   BREAST BIOPSY  03/15/2022   MM LT RADIOACTIVE SEED LOC MAMMO GUIDE 03/15/2022 [facility]   BREAST LUMPECTOMY WITH RADIOACTIVE SEED LOCALIZATION Left 03/16/2022   Procedure: LEFT BREAST LUMPECTOMY WITH RADIOACTIVE SEED LOCALIZATION;  Surgeon: Vanderbilt Ned, MD;  Location: [facility];  Service: General;  Laterality: Left;   FEMUR IM NAIL Right 05/14/2021   Procedure: INTRAMEDULLARY (IM) RETROGRADE FEMORAL NAILING;  Surgeon: Beuford Anes, MD;  Location: [facility];  Service: Orthopedics;  Laterality: Right;   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   Normocytic anemia 07/12/2021   History of femur fracture 07/11/2021   Seasonal allergies 04/08/2021   Restrictive lung disease 03/25/2021   Prediabetes 03/25/2021   Thoracic aortic ectasia (HCC) 09/07/2020   Congestive heart failure (HCC) 08/03/2020   Esophageal reflux 08/14/2017   Vitamin D  deficiency 01/12/2017   Cataract 09/19/2013   Hypothyroidism 04/26/2006   Hyperlipidemia 04/26/2006   BMI 45.0-49.9, adult (HCC) 04/26/2006   Major depressive disorder, recurrent episode (HCC) 04/26/2006   HYPERTENSION, BENIGN SYSTEMIC 04/26/2006    PCP: Anders Otto DASEN, MD  REFERRING PROVIDER: Anders Otto DASEN, MD  REFERRING DIAG:  M54.16 (ICD-10-CM) - Lumbar radiculopathy M25.551 (ICD-10-CM) - Right hip pain  Rationale for Evaluation and Treatment: Rehabilitation  THERAPY DIAG:  Pain in right leg  Pain in  left leg  Muscle weakness (generalized)  Other abnormalities of gait and mobility  ONSET DATE: Chronic  SUBJECTIVE:                                                                                                                                                                                           SUBJECTIVE STATEMENT: Pt presents to PT with no current pain today, had some muscle soreness after last session.   PERTINENT HISTORY:  L femur fx, HTN, CHF, breast cancer  PAIN:  Are you having pain?   Yes: NPRS scale: 1-2/10 Worst: 10/10 Pain location: bilateral LE (posterior/lateral hips) Pain description: sharp, numb Aggravating factors: walking, standing Relieving factors: medication   PRECAUTIONS: None  RED FLAGS: None   WEIGHT BEARING RESTRICTIONS: No  FALLS:  Has patient fallen in last 6 months? No  LIVING ENVIRONMENT: Lives with: lives with their family Lives in: House/apartment Stairs: Yes: External: 4 steps; on right going up Has following equipment at home: None  OCCUPATION: Retired Designer, industrial/product  PLOF: Independent  PATIENT GOALS: improve standing tolerance, decrease pain  NEXT MD VISIT: 08/14/2023  OBJECTIVE:  Note: Objective measures were completed at Evaluation unless otherwise noted.  DIAGNOSTIC FINDINGS:  See imaging for recent lumbar MRI  PATIENT SURVEYS:   LEFS: 33/80  COGNITION: Overall cognitive status: Within functional limits for tasks assessed     SENSATION: Light touch: Impaired - anterior shin R  MUSCLE LENGTH: Eye test: Right (+); Left (+)  POSTURE: rounded shoulders, forward head, increased lumbar lordosis, and large body habitus   PALPATION: TTP to bilateral posterior/lateral hip  LOWER EXTREMITY ROM:     Active  Right eval Left eval  Hip flexion    Hip extension    Hip abduction    Hip adduction    Hip internal rotation    Hip external rotation    Knee flexion [facility] WFL  Knee extension    Ankle dorsiflexion    Ankle plantarflexion    Ankle inversion    Ankle eversion     (Blank rows = not tested)  LOWER EXTREMITY MMT:    MMT Right eval Left eval  Hip flexion 4 3+  Hip extension    Hip abduction 3+ 3+  Hip adduction    Hip internal rotation    Hip external rotation    Knee flexion 4 4  Knee extension 4 4  Ankle dorsiflexion    Ankle plantarflexion    Ankle inversion    Ankle eversion     (Blank rows = not tested)  LUMBAR SPECIAL TESTS:  Straight leg raise test: Negative and Slump test:  Negative  FUNCTIONAL TESTS:  30 Second Sit to Stand: 4 reps with UE  GAIT: Distance walked: 38ft Assistive device utilized: None Level of assistance: SBA Comments: flexed trunk, decreased gait speed  TREATMENT: [facility] Adult PT Treatment:                                                DATE: 09/06/23 Nu-step L5 x 4 min UE/LE for functional activity tolerance LAQ 2x10 5# Supine QS with towel under knee x 10 - 5 hold Supine SLR 2x10 ea Hooklying clam 2x15 black band LTR x 10 Bridge 2x10 STS 2x10 - high table  [facility] Adult PT Treatment:                                                DATE: 08/30/23 Nu-step L5 x 4 min UE/LE for functional activity tolerance LAQ 2x10 5# Seated hamstring curl 2x10 black band Supine SLR 2x10 ea LTR x 10  Hooklying clam 2x15 blue band Hooklying marching 2x20 blue band Standing mini squat with UE support 3x5  [facility] Adult PT Treatment:                                                DATE: 08/28/23 Nu-step L5 x 4 min UE/LE for functional activity tolerance LAQ 2x10 5# STS 3x5  Supine QS x 10 - 5 hold Supine SLR 2x10 ea LTR x 10  Hooklying clam 2x15 blue band Hooklying marching 2x20 blue band  [facility] Adult PT Treatment:                                                DATE: 08/23/23 Nu-step L5 x 5 min UE/LE for functional activity tolerance LAQ 2x10 5# LTR x 10  Supine SLR 2x10 ea Hooklying clam 2x15 blue band STS 2x5  Standing hip abd/ext 2x10 ea Review of tests/measures, goals, and outcomes  PATIENT EDUCATION:  Education details: HEP updates and POC extension Person educated: Patient Education method: Explanation, Demonstration, and Handouts Education comprehension: verbalized understanding and returned demonstration  HOME EXERCISE PROGRAM: Access Code: [email or web address] URL: [email or web address] Date: 08/23/2023 Prepared by: Alm Kingdom  Exercises - Supine Quadricep Sets  - 1 x daily - 7 x weekly - 2 sets - 10 reps - 5 sec hold - Active  Straight Leg Raise with Quad Set  - 1 x daily - 7 x weekly - 2 sets - 10 reps - Hooklying Clamshell with Resistance  - 1 x daily - 7 x weekly - 2-3 sets - 15 reps - green band hold - Beginner Bridge  - 1 x daily - 7 x weekly - 2 sets - 10 reps - Sit to Stand with Hands on Knees  - 1 x daily - 7 x weekly - 2 sets - 10 reps - Standing Hip Abduction with Counter Support  - 1 x daily - 7 x weekly - 2 sets - 10 reps - Standing Hip Extension  with Counter Support  - 1 x daily - 7 x weekly - 2 sets - 10 reps  ASSESSMENT:  CLINICAL IMPRESSION: Pt was able to complete prescribed exercises with no adverse effect. PT focused on continued progression of  general LE strengthening in seated and supine positioning. She continues to benefit from skilled PT, we will progress her in gym and aquatic environments over next few weeks.   Evaluation: Patient is a 68 y.o. F who was seen today for physical therapy evaluation and treatment for chronic LE pain with occasional LBP. Physical findings are consistent with MD impression as pt demonstrates decrease in core and LE strength and functional mobility. LEFS score shows severe disability in performance of home ADLs and community activities. Pt would benefit from skilled PT services working on improving strength, activity tolerance, and mobility while decreasing pain.    OBJECTIVE IMPAIRMENTS: Abnormal gait, decreased activity tolerance, decreased endurance, decreased mobility, difficulty walking, decreased ROM, decreased strength, and pain  ACTIVITY LIMITATIONS: carrying, lifting, standing, squatting, stairs, transfers, and locomotion level  PARTICIPATION LIMITATIONS: meal prep, cleaning, driving, shopping, community activity, occupation, and yard work  PERSONAL FACTORS: Time since onset of injury/illness/exacerbation and 3+ comorbidities: L femur fx, HTN, CHF, breast cancer are also affecting patient's functional outcome.   REHAB POTENTIAL: Good  CLINICAL DECISION  MAKING: Evolving/moderate complexity  EVALUATION COMPLEXITY: Moderate   GOALS: Goals reviewed with patient? No  SHORT TERM GOALS: Target date: 07/31/2023  Pt will be compliant and knowledgeable with initial HEP for improved comfort and carryover Baseline: initial HEP given Goal status: MET 07/31/23  2.  Pt will self report LE pain no greater than 7/10 for improved comfort and functional ability Baseline: 10/10 at worst Goal status: Partially met 07/31/23   LONG TERM GOALS: Target date: 10/04/2023    Pt will improve LEFS to no less than 40/80 as proxy for functional improvement with home ADLs and higher level community activity with home ADLs and community activities Baseline: 33/80 08/23/2023: 44/80 Goal status: MET   2.  Pt will self report LE pain no greater than 3/10 for improved comfort and functional ability Baseline: 10/10 at worst Worst: 10/10 Goal status: IN PROGRESS   3.  Pt will increase 30 Second Sit to Stand rep count to no less than 8 reps for improved balance, strength, and functional mobility Baseline: 4 reps with UE 08/23/2023: 6 reps no UE Goal status: IN PROGRESS   4.  Pt will improve standing activity tolerance to no less than 20 minutes for improved comfort and functional ability with shopping and church activities Baseline: 4-5 minutes 08/23/2023: 15-20 minutes Goal status: IN PROGRESS  5.  Pt will improve all LE MMT to no less than 4/5 for improved functional mobility and decrease pain Baseline:  Goal status: IN PROGRESS  PLAN:  PT FREQUENCY: 2x/week  PT DURATION: 8 weeks  PLANNED INTERVENTIONS: 97164- PT Re-evaluation, 97110-Therapeutic exercises, 97530- Therapeutic activity, [id number]- Neuromuscular re-education, 97535- Self Care, 02859- Manual therapy, [id number]- Gait training, [id number]- Aquatic Therapy, H9716- Electrical stimulation (unattended), [id number]- Electrical stimulation (manual), 97016- Vasopneumatic device, Cryotherapy, and Moist heat.  PLAN FOR NEXT  SESSION: assess HEP response, strengthening in gym and aquatic environments, standing activity tolerances   Alm JAYSON Kingdom PT 09/06/23  4:51 PM

## 2023-09-10 ENCOUNTER — Other Ambulatory Visit: Payer: Self-pay

## 2023-09-10 NOTE — Patient Outreach (Signed)
 Complex Care Management   Visit Note  09/10/2023  Name:  Andrea [name] MRN: [id number] DOB: 29-Jun-1955  Situation: Referral received for Complex Care Management related to SDOH Barriers:  Transportation Housing wanting to move Food insecurity Financial Resource Strain I obtained verbal consent from Patient.  Visit completed with patient  on the phone  Background:   Past Medical History:  Diagnosis Date   Abnormal mammogram 08/28/2014   08/28/14 - Probable benign microcalcifications over the outer mid to lower left breast. Bi-Rads 3. F/U 6 months    Allergic rhinitis 06/22/2014   Arm pain, lateral, left 04/28/2016   Asthma    Breast cancer (HCC) 03/16/2022   Carpal tunnel syndrome 04/26/2006   Qualifier: Diagnosis of  By: Benjamine MD, Aaron     Femur fracture [facility]) 05/14/2021   Femur fracture, right (HCC) 05/14/2021   First degree AV block 09/22/2013   EKG 09/22/13    GANGLION CYST, WRIST, RIGHT 10/06/2008   Qualifier: Diagnosis of  By: Lucille  MD, Taineisha     Hypothyroidism    Imbalance 01/12/2017   Palpitations 09/22/2013   Right wrist pain 04/28/2016    Assessment: BSW met with patient over the phone for f/u call. Patient was alert and cognitive. Patient reports she recently moved. Patient is living in same apartment complex, but different unit ([address]). BSW updated patient address. Patient confirms she received BSW food resources via mail, but has not had time to look over them. Patient states she was busy with her moving process and is still working on transferring some things over. Patient states she feels more comfortable now in her current Apt unit than the one before. While completing, CCM and asked about dental and vision needs, patient stated being knowledgeable on how to find a provider for such needs and will be reaching out soon. Patient states her utilities and rent are caught up at the moment and does not have other needs. When asked about transportation,  patient reports she usually calls a cab or has a relative aid in getting things she needs. BSW will f/u with [employer] regarding her account registration with them and any pending tasks. BSW completed referral form for Home Delivery Program through [employer]. Referral form was emailed to [employer] ([email or web address]). Patient stated she was okay for now and did not have any other needs.   SDOH Interventions    Flowsheet Row Patient Outreach Telephone from 09/10/2023 in [place] POPULATION HEALTH DEPARTMENT Patient Outreach Telephone from 08/15/2023 in [place] POPULATION HEALTH DEPARTMENT Telephone from 08/08/2023 in  POPULATION HEALTH DEPARTMENT Clinical Support from 07/26/2023 in [facility] - A Dept Of [place]. [facility] Office Visit from 05/29/2023 in [facility] - A Dept Of [place]. [facility] Clinical Support from 05/01/2022 in [facility] - A Dept Of [place]. [facility]  SDOH Interventions        Food Insecurity Interventions Community Resources Provided [employer] Provided Other (Comment)  [Mailed [place] [employer] list.] [phone] Referral, Other (Comment)  [food pantry list] -- [id number] Referral, Other (Comment)  [food pantry list]  Housing Interventions Intervention Not Indicated Intervention Not Indicated -- Intervention Not Indicated -- --  Transportation Interventions Other (Comment), Payor Benefit  [Pt uses insurance transportation benefit. Patient is registered with [employer] but needs to go in person to have picture taken to update ID. Will mail patient  information for [employer]. Pt has not been able to visit [employer] due to pain in leg.] Other (Comment), [employer] Benefit  [Pt uses insurance transportation benefit. Patient is registered with [employer] but needs to go in person to have picture taken to update ID. Will mail patient information for [employer]  [employer]. Pt has not been able to visit [employer] due to pain in leg.] Other (Comment), Payor Benefit  [Patient uses [employer] transportation benefit. Patient is registered with [employer] but needs to go in person to have picture taken to update ID. Will mail patient information for [employer].] Payor Benefit -- [employer] Benefit  Utilities Interventions Intervention Not Indicated Intervention Not Indicated Other (Comment)  [Mailed information for utility assistance.] [id number] Referral -- --  Alcohol Usage Interventions -- -- -- Intervention Not Indicated (Score <7) -- --  Depression Interventions/Treatment  -- -- -- PHQ2-9 Score <4 Follow-up Not Indicated Counseling, Medication --  Financial Strain Interventions Community Resources Provided [employer] Provided -- [id number] Referral -- --  Physical Activity Interventions -- -- -- Intervention Not Indicated  [She has [employer] membership] -- --  Stress Interventions -- -- -- Intervention Not Indicated -- --  Social Connections Interventions -- -- -- Intervention Not Indicated -- --      Recommendation:   none  Follow Up Plan:   Telephone follow up appointment date/time:  09/24/2023 at 11AM.   Laymon Doll, BSW Cutter/VBCI - [facility] Social Worker [phone]

## 2023-09-10 NOTE — Patient Instructions (Signed)
 Visit Information  Thank you for taking time to visit with me today. Please don't hesitate to contact me if I can be of assistance to you before our next scheduled appointment.  Your next care management appointment is by telephone on 09/24/2023 at 11AM  Please call the care guide team at [phone] if you need to cancel, schedule, or reschedule an appointment.   Please call the Suicide and Crisis Lifeline: 988 go to [facility] [address], [place] [phone]) call 911 if you are experiencing a Mental Health or Behavioral Health Crisis or need someone to talk to.  Laymon Doll, BSW /VBCI - [employer] Social Worker [phone]

## 2023-09-13 ENCOUNTER — Ambulatory Visit

## 2023-09-14 ENCOUNTER — Ambulatory Visit

## 2023-09-19 ENCOUNTER — Telehealth: Payer: Self-pay

## 2023-09-19 ENCOUNTER — Ambulatory Visit

## 2023-09-19 NOTE — Therapy (Incomplete)
 OUTPATIENT PHYSICAL THERAPY TREATMENT/PROGRESS NOTE  Progress Note Reporting Period 07/10/2023 to 09/06/2023  See note below for Objective Data and Assessment of Progress/Goals.   Patient Name: Andrea [name] MRN: [id number] DOB:1955/03/05, 68 y.o., female Today's Date: 09/19/2023  END OF SESSION:            Past Medical History:  Diagnosis Date   Abnormal mammogram 08/28/2014   08/28/14 - Probable benign microcalcifications over the outer mid to lower left breast. Bi-Rads 3. F/U 6 months    Allergic rhinitis 06/22/2014   Arm pain, lateral, left 04/28/2016   Asthma    Breast cancer (HCC) 03/16/2022   Carpal tunnel syndrome 04/26/2006   Qualifier: Diagnosis of  By: Benjamine MD, Aaron     Femur fracture [facility]) 05/14/2021   Femur fracture, right (HCC) 05/14/2021   First degree AV block 09/22/2013   EKG 09/22/13    GANGLION CYST, WRIST, RIGHT 10/06/2008   Qualifier: Diagnosis of  By: Lucille  MD, Taineisha     Hypothyroidism    Imbalance 01/12/2017   Palpitations 09/22/2013   Right wrist pain 04/28/2016   Past Surgical History:  Procedure Laterality Date   ABDOMINAL HYSTERECTOMY     fibroids   BREAST BIOPSY Left 01/24/2022   MM LT BREAST BX W LOC DEV 1ST LESION IMAGE BX SPEC STEREO GUIDE 01/24/2022 [facility]   BREAST BIOPSY  03/15/2022   MM LT RADIOACTIVE SEED LOC MAMMO GUIDE 03/15/2022 [facility]   BREAST LUMPECTOMY WITH RADIOACTIVE SEED LOCALIZATION Left 03/16/2022   Procedure: LEFT BREAST LUMPECTOMY WITH RADIOACTIVE SEED LOCALIZATION;  Surgeon: Vanderbilt Ned, MD;  Location: [facility];  Service: General;  Laterality: Left;   FEMUR IM NAIL Right 05/14/2021   Procedure: INTRAMEDULLARY (IM) RETROGRADE FEMORAL NAILING;  Surgeon: Beuford Anes, MD;  Location: [facility];  Service: Orthopedics;  Laterality: Right;   Patient Active Problem List   Diagnosis Date Noted   Lumbar radiculopathy 06/29/2023   CKD stage 3a, GFR 45-59 ml/min (HCC)  06/29/2023   Elevated liver enzymes 05/04/2023   Hip pain 04/17/2023   Insomnia 02/06/2023   Genetic testing 06/19/2022   Skin desquamation 06/14/2022   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   Normocytic anemia 07/12/2021   History of femur fracture 07/11/2021   Seasonal allergies 04/08/2021   Restrictive lung disease 03/25/2021   Prediabetes 03/25/2021   Thoracic aortic ectasia (HCC) 09/07/2020   Congestive heart failure (HCC) 08/03/2020   Esophageal reflux 08/14/2017   Vitamin D  deficiency 01/12/2017   Cataract 09/19/2013   Hypothyroidism 04/26/2006   Hyperlipidemia 04/26/2006   BMI 45.0-49.9, adult (HCC) 04/26/2006   Major depressive disorder, recurrent episode (HCC) 04/26/2006   HYPERTENSION, BENIGN SYSTEMIC 04/26/2006    PCP: Anders Otto DASEN, MD  REFERRING PROVIDER: Anders Otto DASEN, MD  REFERRING DIAG:  M54.16 (ICD-10-CM) - Lumbar radiculopathy M25.551 (ICD-10-CM) - Right hip pain  Rationale for Evaluation and Treatment: Rehabilitation  THERAPY DIAG:  No diagnosis found.  ONSET DATE: Chronic  SUBJECTIVE:  SUBJECTIVE STATEMENT: ***  PERTINENT HISTORY:  L femur fx, HTN, CHF, breast cancer  PAIN:  Are you having pain?  Yes: NPRS scale: 1-2/10 Worst: 10/10 Pain location: bilateral LE (posterior/lateral hips) Pain description: sharp, numb Aggravating factors: walking, standing Relieving factors: medication   PRECAUTIONS: None  RED FLAGS: None   WEIGHT BEARING RESTRICTIONS: No  FALLS:  Has patient fallen in last 6 months? No  LIVING ENVIRONMENT: Lives with: lives with their family Lives in: House/apartment Stairs: Yes: External: 4 steps; on right going up Has following equipment at home: None  OCCUPATION: Retired Designer, industrial/product  PLOF:  Independent  PATIENT GOALS: improve standing tolerance, decrease pain  NEXT MD VISIT: 08/14/2023  OBJECTIVE:  Note: Objective measures were completed at Evaluation unless otherwise noted.  DIAGNOSTIC FINDINGS:  See imaging for recent lumbar MRI  PATIENT SURVEYS:   LEFS: 33/80  COGNITION: Overall cognitive status: Within functional limits for tasks assessed     SENSATION: Light touch: Impaired - anterior shin R  MUSCLE LENGTH: Barrientez test: Right (+); Left (+)  POSTURE: rounded shoulders, forward head, increased lumbar lordosis, and large body habitus   PALPATION: TTP to bilateral posterior/lateral hip  LOWER EXTREMITY ROM:     Active  Right eval Left eval  Hip flexion    Hip extension    Hip abduction    Hip adduction    Hip internal rotation    Hip external rotation    Knee flexion [facility] WFL  Knee extension    Ankle dorsiflexion    Ankle plantarflexion    Ankle inversion    Ankle eversion     (Blank rows = not tested)  LOWER EXTREMITY MMT:    MMT Right eval Left eval  Hip flexion 4 3+  Hip extension    Hip abduction 3+ 3+  Hip adduction    Hip internal rotation    Hip external rotation    Knee flexion 4 4  Knee extension 4 4  Ankle dorsiflexion    Ankle plantarflexion    Ankle inversion    Ankle eversion     (Blank rows = not tested)  LUMBAR SPECIAL TESTS:  Straight leg raise test: Negative and Slump test: Negative  FUNCTIONAL TESTS:  30 Second Sit to Stand: 4 reps with UE  GAIT: Distance walked: 109ft Assistive device utilized: None Level of assistance: SBA Comments: flexed trunk, decreased gait speed  TREATMENT: [facility] Adult PT Treatment:                                                DATE: 09/19/23 Nu-step L5 x 4 min UE/LE for functional activity tolerance LAQ 2x10 5# Supine QS with towel under knee x 10 - 5 hold Supine SLR 2x10 ea Hooklying clam 2x15 black band LTR x 10 Bridge 2x10 STS 2x10 - high table  [facility] Adult PT Treatment:                                                 DATE: 09/06/23 Nu-step L5 x 4 min UE/LE for functional activity tolerance LAQ 2x10 5# Supine QS with towel under knee x 10 - 5 hold Supine SLR 2x10 ea Hooklying clam 2x15 black band LTR x  10 Bridge 2x10 STS 2x10 - high table  [facility] Adult PT Treatment:                                                DATE: 08/30/23 Nu-step L5 x 4 min UE/LE for functional activity tolerance LAQ 2x10 5# Seated hamstring curl 2x10 black band Supine SLR 2x10 ea LTR x 10  Hooklying clam 2x15 blue band Hooklying marching 2x20 blue band Standing mini squat with UE support 3x5  [facility] Adult PT Treatment:                                                DATE: 08/28/23 Nu-step L5 x 4 min UE/LE for functional activity tolerance LAQ 2x10 5# STS 3x5  Supine QS x 10 - 5 hold Supine SLR 2x10 ea LTR x 10  Hooklying clam 2x15 blue band Hooklying marching 2x20 blue band  [facility] Adult PT Treatment:                                                DATE: 08/23/23 Nu-step L5 x 5 min UE/LE for functional activity tolerance LAQ 2x10 5# LTR x 10  Supine SLR 2x10 ea Hooklying clam 2x15 blue band STS 2x5  Standing hip abd/ext 2x10 ea Review of tests/measures, goals, and outcomes  PATIENT EDUCATION:  Education details: HEP updates and POC extension Person educated: Patient Education method: Explanation, Demonstration, and Handouts Education comprehension: verbalized understanding and returned demonstration  HOME EXERCISE PROGRAM: Access Code: [email or web address] URL: [email or web address] Date: 08/23/2023 Prepared by: Alm Kingdom  Exercises - Supine Quadricep Sets  - 1 x daily - 7 x weekly - 2 sets - 10 reps - 5 sec hold - Active Straight Leg Raise with Quad Set  - 1 x daily - 7 x weekly - 2 sets - 10 reps - Hooklying Clamshell with Resistance  - 1 x daily - 7 x weekly - 2-3 sets - 15 reps - green band hold - Beginner Bridge  - 1 x daily - 7 x weekly - 2 sets - 10 reps -  Sit to Stand with Hands on Knees  - 1 x daily - 7 x weekly - 2 sets - 10 reps - Standing Hip Abduction with Counter Support  - 1 x daily - 7 x weekly - 2 sets - 10 reps - Standing Hip Extension with Counter Support  - 1 x daily - 7 x weekly - 2 sets - 10 reps  ASSESSMENT:  CLINICAL IMPRESSION: ***  Evaluation: Patient is a 68 y.o. F who was seen today for physical therapy evaluation and treatment for chronic LE pain with occasional LBP. Physical findings are consistent with MD impression as pt demonstrates decrease in core and LE strength and functional mobility. LEFS score shows severe disability in performance of home ADLs and community activities. Pt would benefit from skilled PT services working on improving strength, activity tolerance, and mobility while decreasing pain.    OBJECTIVE IMPAIRMENTS: Abnormal gait, decreased activity tolerance, decreased endurance, decreased mobility, difficulty walking, decreased ROM, decreased strength, and pain  ACTIVITY  LIMITATIONS: carrying, lifting, standing, squatting, stairs, transfers, and locomotion level  PARTICIPATION LIMITATIONS: meal prep, cleaning, driving, shopping, community activity, occupation, and yard work  PERSONAL FACTORS: Time since onset of injury/illness/exacerbation and 3+ comorbidities: L femur fx, HTN, CHF, breast cancer are also affecting patient's functional outcome.   REHAB POTENTIAL: Good  CLINICAL DECISION MAKING: Evolving/moderate complexity  EVALUATION COMPLEXITY: Moderate   GOALS: Goals reviewed with patient? No  SHORT TERM GOALS: Target date: 07/31/2023  Pt will be compliant and knowledgeable with initial HEP for improved comfort and carryover Baseline: initial HEP given Goal status: MET 07/31/23  2.  Pt will self report LE pain no greater than 7/10 for improved comfort and functional ability Baseline: 10/10 at worst Goal status: Partially met 07/31/23   LONG TERM GOALS: Target date: 10/04/2023    Pt will  improve LEFS to no less than 40/80 as proxy for functional improvement with home ADLs and higher level community activity with home ADLs and community activities Baseline: 33/80 08/23/2023: 44/80 Goal status: MET   2.  Pt will self report LE pain no greater than 3/10 for improved comfort and functional ability Baseline: 10/10 at worst Worst: 10/10 Goal status: IN PROGRESS   3.  Pt will increase 30 Second Sit to Stand rep count to no less than 8 reps for improved balance, strength, and functional mobility Baseline: 4 reps with UE 08/23/2023: 6 reps no UE Goal status: IN PROGRESS   4.  Pt will improve standing activity tolerance to no less than 20 minutes for improved comfort and functional ability with shopping and church activities Baseline: 4-5 minutes 08/23/2023: 15-20 minutes Goal status: IN PROGRESS  5.  Pt will improve all LE MMT to no less than 4/5 for improved functional mobility and decrease pain Baseline:  Goal status: IN PROGRESS  PLAN:  PT FREQUENCY: 2x/week  PT DURATION: 8 weeks  PLANNED INTERVENTIONS: 97164- PT Re-evaluation, 97110-Therapeutic exercises, 97530- Therapeutic activity, [id number]- Neuromuscular re-education, 97535- Self Care, [id number]- Manual therapy, [id number]- Gait training, [id number]- Aquatic Therapy, H9716- Electrical stimulation (unattended), [id number]- Electrical stimulation (manual), 97016- Vasopneumatic device, Cryotherapy, and Moist heat.  PLAN FOR NEXT SESSION: assess HEP response, strengthening in gym and aquatic environments, standing activity tolerances   Alm JAYSON Kingdom PT 09/19/23  7:56 AM

## 2023-09-19 NOTE — Telephone Encounter (Signed)
 Patient's transportation took her to [facility] instead of [facility] clinic location by mistake. We confirmed her next appointment on Friday.   Alm JAYSON Kingdom PT 09/19/23 12:02 PM

## 2023-09-20 ENCOUNTER — Ambulatory Visit (INDEPENDENT_AMBULATORY_CARE_PROVIDER_SITE_OTHER): Admitting: Family Medicine

## 2023-09-20 ENCOUNTER — Encounter: Payer: Self-pay | Admitting: Family Medicine

## 2023-09-20 VITALS — BP 101/68 | HR 89 | Wt 261.4 lb

## 2023-09-20 DIAGNOSIS — E039 Hypothyroidism, unspecified: Secondary | ICD-10-CM

## 2023-09-20 DIAGNOSIS — R748 Abnormal levels of other serum enzymes: Secondary | ICD-10-CM

## 2023-09-20 DIAGNOSIS — I10 Essential (primary) hypertension: Secondary | ICD-10-CM

## 2023-09-20 NOTE — Assessment & Plan Note (Addendum)
 TSH checked Will contact her soon with her result.

## 2023-09-20 NOTE — Patient Instructions (Signed)
Elevated Liver Enzymes  When the liver gets hurt, it lets out substances called enzymes into the blood. This makes the enzyme levels in your blood go up. These are elevated liver enzymes. A blood test can detect this. The most common enzymes released are alanine transaminase (ALT) and aspartate transaminase (AST). These two enzymes are also called transaminases. What causes elevated liver enzymes? Elevated liver enzymes can be caused by: Liver problems, like hepatitis B or C. Major muscle injury. Regular or heavy alcohol use. Certain medicines and supplements. If liver enzymes are a little higher than normal people often do not have symptoms. Your health care provider may do more blood tests or imaging tests to help find the cause of your elevated liver enzymes. Follow these instructions at home: Medicines Ask about changing or stopping: Any medicines you take. Any vitamins, herbs, or supplements you take. Do not take aspirin or ibuprofen unless you're told to. Alcohol use Do not drink alcohol if: Your provider tells you not to drink. You're pregnant, may be pregnant, or plan to become pregnant. If you drink alcohol: Limit how much you have to: 0-1 drink a day if you're female. 0-2 drinks a day if you're female. Know how much alcohol is in your drink. In the [place] one drink is one 12 oz bottle of beer (355 mL), one 5 oz glass of wine (148 mL), or one 1 oz glass of hard liquor (44 mL). Lifestyle  To help your liver heal, your provider may tell you to: Eat a healthy diet. Exercise. Lose weight. Limit foods and drinks that have a lot of sugar. General instructions Drink more fluids as told. Ask what things are safe for you to do at home. Ask when you can go back to work or school. Keep all follow-up visits. The provider will check if your liver enzymes are going down. Contact a health care provider if: You have pain or swelling in your belly. Your skin or eyes turn yellow. You  lose weight without trying. You throw up or feel like you may throw up. This information is not intended to replace advice given to you by your health care provider. Make sure you discuss any questions you have with your health care provider. Document Revised: 06/19/2022 Document Reviewed: 06/19/2022 Elsevier Patient Education  2024 [employer].

## 2023-09-20 NOTE — Assessment & Plan Note (Addendum)
 Cmet rechecked today Hepatitis panel normal RUQ US  showed hepatosteatosis which might be contributory to her transaminates I will call her soon with her test results

## 2023-09-20 NOTE — Assessment & Plan Note (Addendum)
 BP looks good. Continue Coreg  6.25 mg BID and Norvasc  5 mg QD.

## 2023-09-20 NOTE — Progress Notes (Signed)
    SUBJECTIVE:   CHIEF COMPLAINT / HPI: She came with all home medications and med reconciliation completed today.  HTN: Compliant with meds. Here for f/u.  Hypothyroidism: Compliant with Synthroid  125 mcg every day. Here for lab follow up.  Elevated Liver enzyme:  She remains off statins and fish oil . Reduced Zoloft  from 100 mg every day to 50 mg every day. Here for lab follow up.  PERTINENT  PMH / PSH: PMHx reviewed  OBJECTIVE:   Vitals:   09/20/23 1519 09/20/23 1521 09/20/23 1523  BP: 93/79 (!) 108/96 101/68  Pulse: 89    SpO2: 99%    Weight: 261 lb 6.4 oz (118.6 kg)      Physical Exam Vitals and nursing note reviewed.  Constitutional:      Appearance: Normal appearance. She is obese.  Cardiovascular:     Rate and Rhythm: Normal rate and regular rhythm.     Heart sounds: Normal heart sounds. No murmur heard. Pulmonary:     Effort: Pulmonary effort is normal. No respiratory distress.     Breath sounds: Normal breath sounds. No wheezing.  Abdominal:     General: Abdomen is flat. Bowel sounds are normal. There is no distension.     Palpations: Abdomen is soft. There is no mass.     Tenderness: There is no abdominal tenderness.  Musculoskeletal:     Right lower leg: No edema.     Left lower leg: No edema.      ASSESSMENT/PLAN:   Assessment & Plan Hypothyroidism, unspecified type TSH checked Will contact her soon with her result.  Elevated liver enzymes Cmet rechecked today Hepatitis panel normal RUQ US  showed hepatosteatosis which might be contributory to her transaminates I will call her soon with her test results HYPERTENSION, BENIGN SYSTEMIC BP looks good. Continue Coreg  6.25 mg BID and Norvasc  5 mg QD.     Otto Fairly, MD [facility]

## 2023-09-21 ENCOUNTER — Ambulatory Visit: Payer: Self-pay | Admitting: Family Medicine

## 2023-09-21 ENCOUNTER — Ambulatory Visit

## 2023-09-21 DIAGNOSIS — M79604 Pain in right leg: Secondary | ICD-10-CM

## 2023-09-21 DIAGNOSIS — R2689 Other abnormalities of gait and mobility: Secondary | ICD-10-CM | POA: Diagnosis not present

## 2023-09-21 DIAGNOSIS — M79605 Pain in left leg: Secondary | ICD-10-CM | POA: Diagnosis not present

## 2023-09-21 DIAGNOSIS — M6281 Muscle weakness (generalized): Secondary | ICD-10-CM | POA: Diagnosis not present

## 2023-09-21 LAB — CMP14+EGFR
ALT: 39 IU/L — ABNORMAL HIGH (ref 0–32)
AST: 98 IU/L — ABNORMAL HIGH (ref 0–40)
Albumin: 3.6 g/dL — ABNORMAL LOW (ref 3.9–4.9)
Alkaline Phosphatase: 131 IU/L — ABNORMAL HIGH (ref 44–121)
BUN/Creatinine Ratio: 14 (ref 12–28)
BUN: 14 mg/dL (ref 8–27)
Bilirubin Total: 0.5 mg/dL (ref 0.0–1.2)
CO2: 25 mmol/L (ref 20–29)
Calcium: 9.4 mg/dL (ref 8.7–10.3)
Chloride: 100 mmol/L (ref 96–106)
Creatinine, Ser: 1.03 mg/dL — ABNORMAL HIGH (ref 0.57–1.00)
Globulin, Total: 3.4 g/dL (ref 1.5–4.5)
Glucose: 120 mg/dL — ABNORMAL HIGH (ref 70–99)
Potassium: 3.2 mmol/L — ABNORMAL LOW (ref 3.5–5.2)
Sodium: 141 mmol/L (ref 134–144)
Total Protein: 7 g/dL (ref 6.0–8.5)
eGFR: 60 mL/min/1.73 (ref >59)

## 2023-09-21 LAB — TSH: TSH: 2.44 u[IU]/mL (ref 0.450–4.500)

## 2023-09-21 NOTE — Telephone Encounter (Signed)
  Result discussed with her. Mild hyperglycemia - repeat A1C at next visit. Renal function at baseline Mild hypokalemia - potassium-rich diet advised. She stated that she will get bananas and vege from the store today. Liver function improved - continue current plan and recheck in 2 months. She will call to schedule a follow-up appointment. All questions were answered.

## 2023-09-21 NOTE — Therapy (Signed)
 OUTPATIENT PHYSICAL THERAPY TREATMENT   Patient Name: Andrea [name] MRN: [id number] DOB:05/06/1955, 68 y.o., female Today's Date: 09/21/2023  END OF SESSION:  PT End of Session - 09/21/23 1142     Visit Number 11    Number of Visits 17    Date for PT Re-Evaluation 10/04/23    Authorization Type [employer] Dual Complete    PT Start Time 1143    PT Stop Time 1225    PT Time Calculation (min) 42 min    Activity Tolerance Patient tolerated treatment well    Behavior During Therapy WFL for tasks assessed/performed           Past Medical History:  Diagnosis Date   Abnormal mammogram 08/28/2014   08/28/14 - Probable benign microcalcifications over the outer mid to lower left breast. Bi-Rads 3. F/U 6 months    Allergic rhinitis 06/22/2014   Arm pain, lateral, left 04/28/2016   Asthma    Breast cancer (HCC) 03/16/2022   Carpal tunnel syndrome 04/26/2006   Qualifier: Diagnosis of  By: Benjamine MD, Aaron     Femur fracture [facility]) 05/14/2021   Femur fracture, right (HCC) 05/14/2021   First degree AV block 09/22/2013   EKG 09/22/13    GANGLION CYST, WRIST, RIGHT 10/06/2008   Qualifier: Diagnosis of  By: Lucille  MD, Taineisha     Hypothyroidism    Imbalance 01/12/2017   Palpitations 09/22/2013   Right wrist pain 04/28/2016   Past Surgical History:  Procedure Laterality Date   ABDOMINAL HYSTERECTOMY     fibroids   BREAST BIOPSY Left 01/24/2022   MM LT BREAST BX W LOC DEV 1ST LESION IMAGE BX SPEC STEREO GUIDE 01/24/2022 [facility]   BREAST BIOPSY  03/15/2022   MM LT RADIOACTIVE SEED LOC MAMMO GUIDE 03/15/2022 [facility]   BREAST LUMPECTOMY WITH RADIOACTIVE SEED LOCALIZATION Left 03/16/2022   Procedure: LEFT BREAST LUMPECTOMY WITH RADIOACTIVE SEED LOCALIZATION;  Surgeon: Vanderbilt Ned, MD;  Location: [facility];  Service: General;  Laterality: Left;   FEMUR IM NAIL Right 05/14/2021   Procedure: INTRAMEDULLARY (IM) RETROGRADE FEMORAL NAILING;  Surgeon:  Beuford Anes, MD;  Location: [facility];  Service: Orthopedics;  Laterality: Right;   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   Normocytic anemia 07/12/2021   History of femur fracture 07/11/2021   Seasonal allergies 04/08/2021   Restrictive lung disease 03/25/2021   Prediabetes 03/25/2021   Thoracic aortic ectasia (HCC) 09/07/2020   Congestive heart failure (HCC) 08/03/2020   Esophageal reflux 08/14/2017   Vitamin D  deficiency 01/12/2017   Cataract 09/19/2013   Hypothyroidism 04/26/2006   Hyperlipidemia 04/26/2006   BMI 45.0-49.9, adult (HCC) 04/26/2006   Major depressive disorder, recurrent episode (HCC) 04/26/2006   HYPERTENSION, BENIGN SYSTEMIC 04/26/2006    PCP: Anders Otto DASEN, MD  REFERRING PROVIDER: Anders Otto DASEN, MD  REFERRING DIAG:  M54.16 (ICD-10-CM) - Lumbar radiculopathy M25.551 (ICD-10-CM) - Right hip pain  Rationale for Evaluation and Treatment: Rehabilitation  THERAPY DIAG:  Pain in right leg  Pain in left leg  Muscle weakness (generalized)  Other abnormalities of gait and mobility  ONSET DATE: Chronic  SUBJECTIVE:  SUBJECTIVE STATEMENT: Patient reports that she felt ok today until she had to walk back to the pool, L>R hip and ankle pain today.  PERTINENT HISTORY:  L femur fx, HTN, CHF, breast cancer  PAIN:  Are you having pain?  Yes: NPRS scale: 1-2/10 Worst: 10/10 Pain location: bilateral LE (posterior/lateral hips) Pain description: sharp, numb Aggravating factors:  walking, standing Relieving factors: medication   PRECAUTIONS: None  RED FLAGS: None   WEIGHT BEARING RESTRICTIONS: No  FALLS:  Has patient fallen in last 6 months? No  LIVING ENVIRONMENT: Lives with: lives with their family Lives in: House/apartment Stairs: Yes: External: 4 steps; on right going up Has following equipment at home: None  OCCUPATION: Retired Designer, industrial/product  PLOF: Independent  PATIENT GOALS: improve standing tolerance, decrease pain  NEXT MD VISIT: 08/14/2023  OBJECTIVE:  Note: Objective measures were completed at Evaluation unless otherwise noted.  DIAGNOSTIC FINDINGS:  See imaging for recent lumbar MRI  PATIENT SURVEYS:   LEFS: 33/80  COGNITION: Overall cognitive status: Within functional limits for tasks assessed     SENSATION: Light touch: Impaired - anterior shin R  MUSCLE LENGTH: [place] test: Right (+); Left (+)  POSTURE: rounded shoulders, forward head, increased lumbar lordosis, and large body habitus   PALPATION: TTP to bilateral posterior/lateral hip  LOWER EXTREMITY ROM:     Active  Right eval Left eval  Hip flexion    Hip extension    Hip abduction    Hip adduction    Hip internal rotation    Hip external rotation    Knee flexion [facility] WFL  Knee extension    Ankle dorsiflexion    Ankle plantarflexion    Ankle inversion    Ankle eversion     (Blank rows = not tested)  LOWER EXTREMITY MMT:    MMT Right eval Left eval  Hip flexion 4 3+  Hip extension    Hip abduction 3+ 3+  Hip adduction    Hip internal rotation    Hip external rotation    Knee flexion 4 4  Knee extension 4 4  Ankle dorsiflexion    Ankle plantarflexion    Ankle inversion    Ankle eversion     (Blank rows = not tested)  LUMBAR SPECIAL TESTS:  Straight leg raise test: Negative and Slump test: Negative  FUNCTIONAL TESTS:  30 Second Sit to Stand: 4 reps with UE  GAIT: Distance walked: 59ft Assistive device utilized: None Level of assistance:  SBA Comments: flexed trunk, decreased gait speed  TREATMENT: [facility] Adult PT Treatment:                                                DATE: 09/21/23 Aquatic therapy at [facility]- [address] - therapeutic pool temp approximately 91 degrees. Pt enters building ambulating independently. Treatment took place in water 3.8 to  4 ft 8 in. deep depending upon activity.  Pt entered and exited the pool via stair and handrails independently.  Aquatic Exercise: Walking forward/side stepping holding yellow noodle Step ups to bottom step with single rail use x5 BIL Standing with UE support edge of pool: Hip abd/add x10 BIL Hip ext x10 BIL Hamstring curl x20 BIL Squats 2x10 Heel/toe raises x10 ea Hip ext/flex with knee straight x 10 BIL Hip Circles CW/CCW x10 each BIL  Pt requires the buoyancy  of water for active assisted exercises with buoyancy supported for strengthening and AROM exercises. Hydrostatic pressure also supports joints by unweighting joint load by at least 50 % in 3-4 feet depth water. 80% in chest to neck deep water. Water will provide assistance with movement using the current and laminar flow while the buoyancy reduces weight bearing. Pt requires the viscosity of the water for resistance with strengthening exercises.   [facility] Adult PT Treatment:                                                DATE: 09/19/23 Nu-step L5 x 4 min UE/LE for functional activity tolerance LAQ 2x10 5# Supine QS with towel under knee x 10 - 5 hold Supine SLR 2x10 ea Hooklying clam 2x15 black band LTR x 10 Bridge 2x10 STS 2x10 - high table  [facility] Adult PT Treatment:                                                DATE: 09/06/23 Nu-step L5 x 4 min UE/LE for functional activity tolerance LAQ 2x10 5# Supine QS with towel under knee x 10 - 5 hold Supine SLR 2x10 ea Hooklying clam 2x15 black band LTR x 10 Bridge 2x10 STS 2x10 - high table   PATIENT EDUCATION:  Education details: HEP updates and POC  extension Person educated: Patient Education method: Explanation, Demonstration, and Handouts Education comprehension: verbalized understanding and returned demonstration  HOME EXERCISE PROGRAM: Access Code: [email or web address] URL: [email or web address] Date: 08/23/2023 Prepared by: Alm Kingdom  Exercises - Supine Quadricep Sets  - 1 x daily - 7 x weekly - 2 sets - 10 reps - 5 sec hold - Active Straight Leg Raise with Quad Set  - 1 x daily - 7 x weekly - 2 sets - 10 reps - Hooklying Clamshell with Resistance  - 1 x daily - 7 x weekly - 2-3 sets - 15 reps - green band hold - Beginner Bridge  - 1 x daily - 7 x weekly - 2 sets - 10 reps - Sit to Stand with Hands on Knees  - 1 x daily - 7 x weekly - 2 sets - 10 reps - Standing Hip Abduction with Counter Support  - 1 x daily - 7 x weekly - 2 sets - 10 reps - Standing Hip Extension with Counter Support  - 1 x daily - 7 x weekly - 2 sets - 10 reps  ASSESSMENT:  CLINICAL IMPRESSION: Patient presents to aquatic PT session reporting that she was not having much pain before the walk back to the pool area. Describes Lt hip and ankle pain today. Session today focused on ambulation, standing activity tolerance, and gentle strengthening as tolerable in the aquatic environment for use of buoyancy to offload joints and the viscosity of water as resistance during therapeutic exercise. Patient was able to tolerate all prescribed exercises in the aquatic environment with no adverse effects. Patient continues to benefit from skilled PT services on land and aquatic based and should be progressed as able to improve functional independence.    Evaluation: Patient is a 68 y.o. F who was seen today for physical therapy evaluation and treatment for chronic LE pain with occasional LBP.  Physical findings are consistent with MD impression as pt demonstrates decrease in core and LE strength and functional mobility. LEFS score shows severe disability in performance  of home ADLs and community activities. Pt would benefit from skilled PT services working on improving strength, activity tolerance, and mobility while decreasing pain.    OBJECTIVE IMPAIRMENTS: Abnormal gait, decreased activity tolerance, decreased endurance, decreased mobility, difficulty walking, decreased ROM, decreased strength, and pain  ACTIVITY LIMITATIONS: carrying, lifting, standing, squatting, stairs, transfers, and locomotion level  PARTICIPATION LIMITATIONS: meal prep, cleaning, driving, shopping, community activity, occupation, and yard work  PERSONAL FACTORS: Time since onset of injury/illness/exacerbation and 3+ comorbidities: L femur fx, HTN, CHF, breast cancer are also affecting patient's functional outcome.   REHAB POTENTIAL: Good  CLINICAL DECISION MAKING: Evolving/moderate complexity  EVALUATION COMPLEXITY: Moderate   GOALS: Goals reviewed with patient? No  SHORT TERM GOALS: Target date: 07/31/2023  Pt will be compliant and knowledgeable with initial HEP for improved comfort and carryover Baseline: initial HEP given Goal status: MET 07/31/23  2.  Pt will self report LE pain no greater than 7/10 for improved comfort and functional ability Baseline: 10/10 at worst Goal status: Partially met 07/31/23   LONG TERM GOALS: Target date: 10/04/2023    Pt will improve LEFS to no less than 40/80 as proxy for functional improvement with home ADLs and higher level community activity with home ADLs and community activities Baseline: 33/80 08/23/2023: 44/80 Goal status: MET   2.  Pt will self report LE pain no greater than 3/10 for improved comfort and functional ability Baseline: 10/10 at worst Worst: 10/10 Goal status: IN PROGRESS   3.  Pt will increase 30 Second Sit to Stand rep count to no less than 8 reps for improved balance, strength, and functional mobility Baseline: 4 reps with UE 08/23/2023: 6 reps no UE Goal status: IN PROGRESS   4.  Pt will improve standing  activity tolerance to no less than 20 minutes for improved comfort and functional ability with shopping and church activities Baseline: 4-5 minutes 08/23/2023: 15-20 minutes Goal status: IN PROGRESS  5.  Pt will improve all LE MMT to no less than 4/5 for improved functional mobility and decrease pain Baseline:  Goal status: IN PROGRESS  PLAN:  PT FREQUENCY: 2x/week  PT DURATION: 8 weeks  PLANNED INTERVENTIONS: 97164- PT Re-evaluation, 97110-Therapeutic exercises, 97530- Therapeutic activity, [id number]- Neuromuscular re-education, 97535- Self Care, [id number]- Manual therapy, [id number]- Gait training, [id number]- Aquatic Therapy, [id number]- Electrical stimulation (unattended), [id number]- Electrical stimulation (manual), 97016- Vasopneumatic device, Cryotherapy, and Moist heat.  PLAN FOR NEXT SESSION: assess HEP response, strengthening in gym and aquatic environments, standing activity tolerances   Corean Pouch PTA 09/21/23  12:19 PM

## 2023-09-24 ENCOUNTER — Ambulatory Visit (INDEPENDENT_AMBULATORY_CARE_PROVIDER_SITE_OTHER): Admitting: Physical Medicine and Rehabilitation

## 2023-09-24 ENCOUNTER — Encounter: Payer: Self-pay | Admitting: Physical Medicine and Rehabilitation

## 2023-09-24 ENCOUNTER — Other Ambulatory Visit: Payer: Self-pay

## 2023-09-24 DIAGNOSIS — M5442 Lumbago with sciatica, left side: Secondary | ICD-10-CM

## 2023-09-24 DIAGNOSIS — M5416 Radiculopathy, lumbar region: Secondary | ICD-10-CM

## 2023-09-24 DIAGNOSIS — R202 Paresthesia of skin: Secondary | ICD-10-CM | POA: Diagnosis not present

## 2023-09-24 DIAGNOSIS — G8929 Other chronic pain: Secondary | ICD-10-CM | POA: Diagnosis not present

## 2023-09-24 NOTE — Progress Notes (Unsigned)
 Andrea [name] - 68 y.o. female MRN [id number]  Date of birth: [birth year]  Office Visit Note: Visit Date: 09/24/2023 PCP: Anders Otto DASEN, MD Referred by: Anders Otto DASEN, MD  Subjective: Chief Complaint  Patient presents with   Lower Back - Pain    With radiation to left leg   HPI: Andrea [name] is a 68 y.o. female who comes in today per the request of Dr. Otto Anders for evaluation of chronic, worsening and severe left sided lower back pain radiating to hip and down lateral leg. Also reports numbness and tingling to left foot. Pain ongoing for several months, worsens with walking and activity. She describes pain as sore and sharp sensation, currently rates as 0 out of 10. Some relief of pain with home exercise regimen, rest and use of medications. She is currently attending formal physical therapy, also participating in water therapy. She feels PT has significantly helped to alleviate her pain. States she attended aquatic therapy last Friday and has been pain free since. Recent lumbar MRI imaging grade 1 spondylolisthesis at L5-S1 with disc and endplate degeneration, up to severe facet degeneration. No spinal or lateral recess stenosis. Up to moderate foraminal stenosis. Query L5 radiculitis. No history of lumbar surgery/injections. Patient denies focal weakness, no recent trauma or falls.       Review of Systems  Musculoskeletal:  Positive for back pain.  Neurological:  Positive for tingling. Negative for sensory change, focal weakness and weakness.  All other systems reviewed and are negative.  Otherwise per HPI.  Assessment & Plan: Visit Diagnoses:    ICD-10-CM   1. Chronic left-sided low back pain with left-sided sciatica  M54.42    G89.29     2. Lumbar radiculopathy  M54.16     3. Paresthesia of skin  R20.2        Plan: Findings:  Chronic left sided lower back pain radiating to hip and down lateral leg. Patient is pain free today following continued  formal physical therapy. I discussed recent lumbar MRI with her today using imaging and spine model. There are findings at L5-S1 that could be a pain generator for her. Her symptoms do correlate with more L5 radiculopathy. Would like her to continue with land/aquatic therapy as these treatments seem to be helping with her pain. Should her pain return we would consider performing lumbar epidural steroid injection. I discussed injection procedure with her in detail today, she has no questions at this time. I encouraged patient to remain active as tolerated. We are happy to see her back as needed. No red flag symptoms noted upon exam today.     Meds & Orders: No orders of the defined types were placed in this encounter.  No orders of the defined types were placed in this encounter.   Follow-up: Return if symptoms worsen or fail to improve.   Procedures: No procedures performed      Clinical History: CLINICAL DATA:  68 year old female with persistent low back pain.   EXAM: MRI LUMBAR SPINE WITHOUT CONTRAST   TECHNIQUE: Multiplanar, multisequence MR imaging of the lumbar spine was performed. No intravenous contrast was administered.   COMPARISON:  None Available.   FINDINGS: Segmentation: Lumbar segmentation appears to be normal and will be designated as such for this report.   Alignment: Preserved, mildly exaggerated lumbar lordosis. Superimposed degenerative appearing grade 1 anterolisthesis of L5 on S1 measuring 5-6 mm. No significant scoliosis.   Vertebrae: Mix of acute and chronic degenerative endplate  marrow signal changes at L5-S1, patchy marrow edema associated anteriorly and asymmetrically to the right. Background bone marrow signal is heterogeneous but within normal limits. No other marrow edema identified. Intact visible sacrum and SI joints.   Conus medullaris and cauda equina: Conus extends to the T12-L1 level. No lower spinal cord or conus signal abnormality.  Normal cauda equina nerve roots and capacious spinal canal.   Paraspinal and other soft tissues: 2 cm gallstones are visible. Negative other visible abdominal viscera. Lumbar paraspinal muscle atrophy is generalized. Negative paraspinal soft tissues otherwise.   Disc levels:   T11-T12: Mild disc desiccation and disc bulging. Mild to moderate facet hypertrophy. No stenosis.   T12-L1:  Negative disc. Mild facet hypertrophy. No stenosis.   L1-L2:  Negative.   L2-L3:  Negative.   L3-L4:  Negative disc. Mild facet hypertrophy. No stenosis.   L4-L5: Negative disc. Mild to moderate facet hypertrophy. No stenosis.   L5-S1: Grade 1 spondylolisthesis. Heterogeneous mild disc desiccation. Mild circumferential disc bulge/pseudo disc and endplate spurring. Moderate to severe facet hypertrophy somewhat greater on the left. Capacious spinal canal, no spinal or lateral recess stenosis. But moderate left and mild to moderate right L5 foraminal stenosis.   IMPRESSION: 1. Grade 1 spondylolisthesis at L5-S1 with disc and endplate degeneration, up to severe facet degeneration. No spinal or lateral recess stenosis. Up to moderate foraminal stenosis. Query L5 radiculitis. 2. No other significant lumbar disc degeneration. Intermittent facet hypertrophy. Capacious spinal canal. 3. Cholelithiasis.     Electronically Signed   By: VEAR Hurst M.D.   On: 06/05/2023 15:47   She reports that she has never smoked. She has never used smokeless tobacco. No results for input(s): HGBA1C, LABURIC in the last 8760 hours.  Objective:  VS:  HT:    WT:   BMI:     BP:   HR: bpm  TEMP: ( )  RESP:  Physical Exam Vitals and nursing note reviewed.  HENT:     Head: Normocephalic and atraumatic.     Right Ear: External ear normal.     Left Ear: External ear normal.     Nose: Nose normal.     Mouth/Throat:     Mouth: Mucous membranes are moist.  Eyes:     Extraocular Movements: Extraocular movements  intact.  Cardiovascular:     Rate and Rhythm: Normal rate.     Pulses: Normal pulses.  Pulmonary:     Effort: Pulmonary effort is normal.  Abdominal:     General: Abdomen is flat. There is no distension.  Musculoskeletal:        General: Tenderness present.     Cervical back: Normal range of motion.     Comments: Patient rises from seated position to standing without difficulty. Good lumbar range of motion. No pain noted with facet loading. 5/5 strength noted with bilateral hip flexion, knee flexion/extension, ankle dorsiflexion/plantarflexion and EHL. No clonus noted bilaterally. No pain upon palpation of greater trochanters. No pain with internal/external rotation of bilateral hips. Sensation intact bilaterally. Negative slump test bilaterally. Ambulates without aid, gait steady.     Skin:    General: Skin is warm and dry.     Capillary Refill: Capillary refill takes less than 2 seconds.  Neurological:     General: No focal deficit present.     Mental Status: She is alert and oriented to person, place, and time.  Psychiatric:        Mood and Affect: Mood normal.  Behavior: Behavior normal.     Ortho Exam  Imaging: No results found.  Past Medical/Family/Surgical/Social History: Medications & Allergies reviewed per EMR, new medications updated. 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   Normocytic anemia 07/12/2021   History of femur fracture 07/11/2021   Seasonal allergies 04/08/2021   Restrictive lung disease 03/25/2021   Prediabetes 03/25/2021   Thoracic aortic ectasia (HCC) 09/07/2020   Congestive heart failure (HCC) 08/03/2020   Esophageal reflux 08/14/2017   Vitamin D   deficiency 01/12/2017   Cataract 09/19/2013   Hypothyroidism 04/26/2006   Hyperlipidemia 04/26/2006   BMI 45.0-49.9, adult (HCC) 04/26/2006   Major depressive disorder, recurrent episode (HCC) 04/26/2006   HYPERTENSION, BENIGN SYSTEMIC 04/26/2006   Past Medical History:  Diagnosis Date   Abnormal mammogram 08/28/2014   08/28/14 - Probable benign microcalcifications over the outer mid to lower left breast. Bi-Rads 3. F/U 6 months    Allergic rhinitis 06/22/2014   Arm pain, lateral, left 04/28/2016   Asthma    Breast cancer (HCC) 03/16/2022   Carpal tunnel syndrome 04/26/2006   Qualifier: Diagnosis of  By: Benjamine MD, Aaron     Femur fracture [facility]) 05/14/2021   Femur fracture, right (HCC) 05/14/2021   First degree AV block 09/22/2013   EKG 09/22/13    GANGLION CYST, WRIST, RIGHT 10/06/2008   Qualifier: Diagnosis of  By: Lucille  MD, Taineisha     Hypothyroidism    Imbalance 01/12/2017   Palpitations 09/22/2013   Right wrist pain 04/28/2016   Family History  Problem Relation Age of Onset   Brain cancer Mother        cervial, pituitary, bone?   Hypertension Mother    Lung cancer Father    Alcohol abuse Father    Hypertension Sister    Hypothyroidism Sister    Hypertension Sister    Hypothyroidism Sister    Hypertension Sister    Hypothyroidism Sister    Hypertension Sister    Hypothyroidism Sister    Hypertension Sister    Hypothyroidism Sister    Prostate cancer Brother    Lung cancer Brother        lung   Ovarian cancer Maternal Aunt    Past Surgical History:  Procedure Laterality Date   ABDOMINAL HYSTERECTOMY     fibroids   BREAST BIOPSY Left 01/24/2022   MM LT BREAST BX W LOC DEV 1ST LESION IMAGE BX SPEC STEREO GUIDE 01/24/2022 [facility]   BREAST BIOPSY  03/15/2022   MM LT RADIOACTIVE SEED LOC MAMMO GUIDE 03/15/2022 [facility]   BREAST LUMPECTOMY WITH RADIOACTIVE SEED LOCALIZATION Left 03/16/2022   Procedure: LEFT BREAST LUMPECTOMY WITH  RADIOACTIVE SEED LOCALIZATION;  Surgeon: Vanderbilt Ned, MD;  Location: [facility];  Service: General;  Laterality: Left;   FEMUR IM NAIL Right 05/14/2021   Procedure: INTRAMEDULLARY (IM) RETROGRADE FEMORAL NAILING;  Surgeon: Beuford Anes, MD;  Location: [facility];  Service: Orthopedics;  Laterality: Right;   Social History   Occupational History   Not on file  Tobacco Use   Smoking status: Never   Smokeless tobacco: Never   Tobacco comments:    Tobacco smoke comes in through the house vent from outside  Vaping Use   Vaping status: Never Used  Substance and Sexual Activity   Alcohol use: No   Drug use: No   Sexual activity: Never    Birth control/protection: Surgical

## 2023-09-24 NOTE — Progress Notes (Unsigned)
 Pain Scale   Average Pain 7   7 months  Low back pain with radiation into left leg  Numbness and tingling Weather makes it worse (cold)  Sharp pain  125/88

## 2023-09-24 NOTE — Patient Instructions (Signed)

## 2023-09-24 NOTE — Progress Notes (Unsigned)
 Core Outcome Measures Index (COMI) Back Score  Average Pain 0  COMI Score 0 %

## 2023-09-24 NOTE — Patient Outreach (Signed)
 Complex Care Management   Visit Note  09/24/2023  Name:  Andrea [name] MRN: [id number] DOB: 13-Dec-1955  Situation: Referral received for Complex Care Management related to SDOH Barriers:  Transportation Housing new apartment unit Food insecurity I obtained verbal consent from Patient.  Visit completed with patient  on the phone  Background:   Past Medical History:  Diagnosis Date   Abnormal mammogram 08/28/2014   08/28/14 - Probable benign microcalcifications over the outer mid to lower left breast. Bi-Rads 3. F/U 6 months    Allergic rhinitis 06/22/2014   Arm pain, lateral, left 04/28/2016   Asthma    Breast cancer (HCC) 03/16/2022   Carpal tunnel syndrome 04/26/2006   Qualifier: Diagnosis of  By: Benjamine MD, Aaron     Femur fracture [facility]) 05/14/2021   Femur fracture, right (HCC) 05/14/2021   First degree AV block 09/22/2013   EKG 09/22/13    GANGLION CYST, WRIST, RIGHT 10/06/2008   Qualifier: Diagnosis of  By: Lucille  MD, Taineisha     Hypothyroidism    Imbalance 01/12/2017   Palpitations 09/22/2013   Right wrist pain 04/28/2016    Assessment: BSW held f/u call with patient. Patient was alert and cognitive. Patient reports that she received food from [employer]. Patient was advised that she will receive food on a monthly basis moving forward every 3 Wednesday of the month. Patient understood and agreed. Patient also confirmed she was able to visit [employer] regarding [employer] [place]. Transportation was provided by her sister and patient was given a form that her PCP has to complete. Patient was unsure of where she left form but was going to look for it or go [place] [employer] again to ask for form and drop off at PCP office. Patient confirmed she still has food resources BSW sent to her via email and will reference them as needed. Patient states she is good right now and does not require information to other resources. Patient states since she moved to new unit things have been going  better for her and her grandson. Patient agreed to case closure and was informed on how to get connected again to social workers through her PCP. Patient understood and agreed and was thankful for the information provided to her. No other resources were provided/requested at this time. BSW will remove himself from care team and close case.   SDOH Interventions    Flowsheet Row Patient Outreach Telephone from 09/24/2023 in [place] POPULATION HEALTH DEPARTMENT Patient Outreach Telephone from 09/10/2023 in [place] POPULATION HEALTH DEPARTMENT Patient Outreach Telephone from 08/15/2023 in [place] POPULATION HEALTH DEPARTMENT Telephone from 08/08/2023 in [place] POPULATION HEALTH DEPARTMENT Clinical Support from 07/26/2023 in [facility] - A Dept Of [place]. [facility] Office Visit from 05/29/2023 in [facility] - A Dept Of [place]. [facility]  SDOH Interventions        Food Insecurity Interventions Community Resources Provided [employer] Resources Provided [employer] Provided Other (Comment)  [Mailed [place] [employer] list.] [employer] Referral, Other (Comment)  [food pantry list] --  Housing Interventions -- Intervention Not Indicated Intervention Not Indicated -- Intervention Not Indicated --  Transportation Interventions -- Other (Comment), Payor Benefit  [Pt uses insurance transportation benefit. Patient is registered with [employer] [place] but needs to go in person to have picture taken to update ID. Will mail patient information for [employer] [place] [employer]. Pt has not been able to visit [employer] due  to pain in leg.] Other (Comment), Payor Benefit  [Pt uses insurance transportation benefit. Patient is registered with [employer] [place] but needs to go in person to have picture taken to update ID. Will mail patient information for [employer] [place] [employer]. Pt has not been able to visit [employer] due to pain in leg.] Other (Comment), Payor Benefit  [Patient  uses [employer] transportation benefit. Patient is registered with [employer] [place] but needs to go in person to have picture taken to update ID. Will mail patient information for [employer] [place] [employer].] Payor Benefit --  Utilities Interventions -- Intervention Not Indicated Intervention Not Indicated Other (Comment)  [Mailed information for utility assistance.] [id number] Referral --  Alcohol Usage Interventions -- -- -- -- Intervention Not Indicated (Score <7) --  Depression Interventions/Treatment  -- -- -- -- PHQ2-9 Score <4 Follow-up Not Indicated Counseling, Medication  Financial Strain Interventions -- Programmer, applications Provided [employer] Provided -- [id number] Referral --  Physical Activity Interventions -- -- -- -- Intervention Not Indicated  [She has [employer] membership] --  Stress Interventions -- -- -- -- Intervention Not Indicated --  Social Connections Interventions -- -- -- -- Intervention Not Indicated --      Recommendation:   none  Follow Up Plan:   Patient has met all care management goals. Care Management case will be closed. Patient has been provided contact information should new needs arise.   Laymon Doll, BSW [place]/[facility] - [employer] Social Worker [phone]

## 2023-09-25 ENCOUNTER — Other Ambulatory Visit: Payer: Self-pay | Admitting: Hematology and Oncology

## 2023-09-25 ENCOUNTER — Encounter: Payer: Self-pay | Admitting: Gastroenterology

## 2023-09-25 ENCOUNTER — Ambulatory Visit

## 2023-09-25 ENCOUNTER — Ambulatory Visit (AMBULATORY_SURGERY_CENTER)

## 2023-09-25 VITALS — Ht 63.0 in | Wt 261.0 lb

## 2023-09-25 DIAGNOSIS — R2689 Other abnormalities of gait and mobility: Secondary | ICD-10-CM | POA: Diagnosis not present

## 2023-09-25 DIAGNOSIS — M79605 Pain in left leg: Secondary | ICD-10-CM | POA: Diagnosis not present

## 2023-09-25 DIAGNOSIS — Z1211 Encounter for screening for malignant neoplasm of colon: Secondary | ICD-10-CM

## 2023-09-25 DIAGNOSIS — M6281 Muscle weakness (generalized): Secondary | ICD-10-CM

## 2023-09-25 DIAGNOSIS — M79604 Pain in right leg: Secondary | ICD-10-CM | POA: Diagnosis not present

## 2023-09-25 MED ORDER — NA SULFATE-K SULFATE-MG SULF 17.5-3.13-1.6 GM/177ML PO SOLN: 1.0000 | Freq: Once | ORAL | 0 refills | Status: AC

## 2023-09-25 NOTE — Therapy (Unsigned)
 OUTPATIENT PHYSICAL THERAPY TREATMENT   Patient Name: Andrea [name] MRN: [id number] DOB:01-02-1956, 68 y.o., female Today's Date: 09/26/2023  END OF SESSION:  PT End of Session - 09/25/23 1320     Visit Number 12    Number of Visits 24    Date for PT Re-Evaluation 11/06/23    Authorization Type UHC Dual Complete    PT Start Time 1400    PT Stop Time 1440    PT Time Calculation (min) 40 min    Activity Tolerance Patient tolerated treatment well    Behavior During Therapy WFL for tasks assessed/performed           Past Medical History:  Diagnosis Date   Abnormal mammogram 08/28/2014   08/28/14 - Probable benign microcalcifications over the outer mid to lower left breast. Bi-Rads 3. F/U 6 months    Allergic rhinitis 06/22/2014   Arm pain, lateral, left 04/28/2016   Asthma    Breast cancer (HCC) 03/16/2022   Carpal tunnel syndrome 04/26/2006   Qualifier: Diagnosis of  By: Benjamine MD, Aaron     Femur fracture [facility]) 05/14/2021   Femur fracture, right (HCC) 05/14/2021   First degree AV block 09/22/2013   EKG 09/22/13    GANGLION CYST, WRIST, RIGHT 10/06/2008   Qualifier: Diagnosis of  By: Lucille  MD, Taineisha     Hypothyroidism    Imbalance 01/12/2017   Palpitations 09/22/2013   Right wrist pain 04/28/2016   Past Surgical History:  Procedure Laterality Date   ABDOMINAL HYSTERECTOMY     fibroids   BREAST BIOPSY Left 01/24/2022   MM LT BREAST BX W LOC DEV 1ST LESION IMAGE BX SPEC STEREO GUIDE 01/24/2022 [facility] MAMMO[facility]AP[facility]   BREAST BIOPSY  03/15/2022   MM LT RADIOACTIVE SEED LOC MAMMO GUIDE 03/15/2022 [facility] MAMMOGR[facility]   BREAST LUMPECTOMY WITH RADIOACTIVE SEED LOCALIZATION Left 03/16/2022   Procedure: LEFT BREAST LUMPECTOMY WITH RADIOACTIVE SEED LOCALIZATION;  Surgeon: Vanderbilt Ned, MD;  Location: [facility];  Service: General;  Laterality: Left;   FEMUR IM NAIL Right 05/14/2021   Procedure: INTRAMEDULLARY (IM) RETROGRADE FEMORAL NAILING;  Surgeon:  Beuford Anes, MD;  Location: [facility];  Service: Orthopedics;  Laterality: Right;   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   Normocytic anemia 07/12/2021   History of femur fracture 07/11/2021   Seasonal allergies 04/08/2021   Restrictive lung disease 03/25/2021   Prediabetes 03/25/2021   Thoracic aortic ectasia (HCC) 09/07/2020   Congestive heart failure (HCC) 08/03/2020   Esophageal reflux 08/14/2017   Vitamin D  deficiency 01/12/2017   Cataract 09/19/2013   Hypothyroidism 04/26/2006   Hyperlipidemia 04/26/2006   BMI 45.0-49.9, adult (HCC) 04/26/2006   Major depressive disorder, recurrent episode (HCC) 04/26/2006   HYPERTENSION, BENIGN SYSTEMIC 04/26/2006    PCP: Anders Otto DASEN, MD  REFERRING PROVIDER: Anders Otto DASEN, MD  REFERRING DIAG:  M54.16 (ICD-10-CM) - Lumbar radiculopathy M25.551 (ICD-10-CM) - Right hip pain  Rationale for Evaluation and Treatment: Rehabilitation  THERAPY DIAG:  Pain in right leg - Plan: PT plan of care cert/re-cert  Pain in left leg - Plan: PT plan of care cert/re-cert  Muscle weakness (generalized) - Plan: PT plan of care cert/re-cert  Other abnormalities of gait and mobility - Plan: PT plan of care cert/re-cert  ONSET DATE: Chronic  SUBJECTIVE:                                                                                                                                                                                           SUBJECTIVE STATEMENT: Pt presents to PT with no current pain. Has been compliant with HEP. Feels like aquatic therapy and PT overall has really helped her pain decrease and she is getting stronger. Standing tolerance  continues to be an issue.   PERTINENT HISTORY:  L femur fx, HTN, CHF, breast cancer  PAIN:  Are you having pain?  Yes: NPRS scale: 1-2/10 Worst: 10/10 Pain location: bilateral LE (posterior/lateral hips) Pain description: sharp, numb Aggravating factors: walking, standing Relieving factors: medication   PRECAUTIONS: None  RED FLAGS: None   WEIGHT BEARING RESTRICTIONS: No  FALLS:  Has patient fallen in last 6 months? No  LIVING ENVIRONMENT: Lives with: lives with their family Lives in: House/apartment Stairs: Yes: External: 4 steps; on right going up Has following equipment at home: None  OCCUPATION: Retired Designer, industrial/product  PLOF: Independent  PATIENT GOALS: improve standing tolerance, decrease pain  NEXT MD VISIT: 08/14/2023  OBJECTIVE:  Note: Objective measures were completed at Evaluation unless otherwise noted.  DIAGNOSTIC FINDINGS:  See imaging for recent lumbar MRI  PATIENT SURVEYS:   LEFS: 33/80 08/23/2023: 44/80  ODI: 29/50 - 58% disability  COGNITION: Overall cognitive status: Within functional limits for tasks assessed     SENSATION: Light touch: Impaired - anterior shin R  MUSCLE LENGTH: Siek test: Right (+); Left (+)  POSTURE: rounded shoulders, forward head, increased lumbar lordosis, and large body habitus   PALPATION: TTP to bilateral posterior/lateral hip  LOWER EXTREMITY ROM:     Active  Right eval Left eval  Hip flexion    Hip extension    Hip abduction    Hip adduction    Hip internal rotation    Hip external rotation    Knee flexion [facility]  Knee extension    Ankle dorsiflexion    Ankle plantarflexion    Ankle inversion    Ankle eversion     (Blank rows = not tested)  LOWER EXTREMITY MMT:    MMT Right eval Left eval Right 09/25/23 Left 09/25/23  Hip flexion 4 3+    Hip extension      Hip abduction 3+ 3+ 4 4  Hip adduction      Hip internal rotation      Hip external rotation      Knee flexion 4  4    Knee  extension 4 4    Ankle dorsiflexion      Ankle plantarflexion      Ankle inversion      Ankle eversion       (Blank rows = not tested)  LUMBAR SPECIAL TESTS:  Straight leg raise test: Negative and Slump test: Negative  FUNCTIONAL TESTS:  30 Second Sit to Stand: 4 reps with UE 08/23/2023: 6 reps no UE 09/25/2023: 5 reps no UE  GAIT: Distance walked: 73ft Assistive device utilized: None Level of assistance: SBA Comments: flexed trunk, decreased gait speed  TREATMENT: [facility] Adult PT Treatment:                                                DATE: 09/25/23 Nu-step L5 x 4 min UE/LE for functional activity tolerance Assessment of tests/measures, goals, and outcomes  Supine SLR x 15 ea Bridge 2x10 Hooklying clam x 15 black band LTR x 10 Repeated flexion in sitting with swiss ball x 15 Lateral flexion x 10 ea with swiss STS x 10 10# DB - high table  [facility] Adult PT Treatment:                                                DATE: 09/21/23 Aquatic therapy at [facility]- [address] - therapeutic pool temp approximately 91 degrees. Pt enters building ambulating independently. Treatment took place in water 3.8 to  4 ft 8 in. deep depending upon activity.  Pt entered and exited the pool via stair and handrails independently.  Aquatic Exercise: Walking forward/side stepping holding yellow noodle Step ups to bottom step with single rail use x5 BIL Standing with UE support edge of pool: Hip abd/add x10 BIL Hip ext x10 BIL Hamstring curl x20 BIL Squats 2x10 Heel/toe raises x10 ea Hip ext/flex with knee straight x 10 BIL Hip Circles CW/CCW x10 each BIL  Pt requires the buoyancy of water for active assisted exercises with buoyancy supported for strengthening and AROM exercises. Hydrostatic pressure also supports joints by unweighting joint load by at least 50 % in 3-4 feet depth water. 80% in chest to neck deep water. Water will provide assistance with movement using the current and  laminar flow while the buoyancy reduces weight bearing. Pt requires the viscosity of the water for resistance with strengthening exercises.   [facility] Adult PT Treatment:                                                DATE: 09/19/23 Nu-step L5 x 4 min UE/LE for functional activity tolerance LAQ 2x10 5# Supine QS with towel under knee x 10 - 5 hold Supine SLR 2x10 ea Hooklying clam 2x15 black band LTR x 10 Bridge 2x10 STS 2x10 - high table  [facility] Adult PT Treatment:  DATE: 09/06/23 Nu-step L5 x 4 min UE/LE for functional activity tolerance LAQ 2x10 5# Supine QS with towel under knee x 10 - 5 hold Supine SLR 2x10 ea Hooklying clam 2x15 black band LTR x 10 Bridge 2x10 STS 2x10 - high table   PATIENT EDUCATION:  Education details: HEP updates and POC extension Person educated: Patient Education method: Explanation, Demonstration, and Handouts Education comprehension: verbalized understanding and returned demonstration  HOME EXERCISE PROGRAM: Access Code: [email or web address] URL: [email or web address] Date: 08/23/2023 Prepared by: Alm Kingdom  Exercises - Supine Quadricep Sets  - 1 x daily - 7 x weekly - 2 sets - 10 reps - 5 sec hold - Active Straight Leg Raise with Quad Set  - 1 x daily - 7 x weekly - 2 sets - 10 reps - Hooklying Clamshell with Resistance  - 1 x daily - 7 x weekly - 2-3 sets - 15 reps - green band hold - Beginner Bridge  - 1 x daily - 7 x weekly - 2 sets - 10 reps - Sit to Stand with Hands on Knees  - 1 x daily - 7 x weekly - 2 sets - 10 reps - Standing Hip Abduction with Counter Support  - 1 x daily - 7 x weekly - 2 sets - 10 reps - Standing Hip Extension with Counter Support  - 1 x daily - 7 x weekly - 2 sets - 10 reps  ASSESSMENT:  CLINICAL IMPRESSION: Pt was able to complete prescribed exercises with no adverse effect. Today we continued to progress exercises focused on proximal hip strength and lumbar  mobility. She shows improving hip abd strength, does continue to note decrease in pain with therapy, especially after aquatics. We assessed her subjective function of her lumbar spine with ODI which shows severe disability in performance of home ADLs and community activities. As pt is improving slowly and getting relief of symptoms with therapy she would likely benefit from continued services with short extension in POC and continuing to alternate gym and aquatic sessions. Pt in agreement with current plan as she things therapy is benefiting her greatly. Will continue progress core/hip strength as tolerated.   Evaluation: Patient is a 68 y.o. F who was seen today for physical therapy evaluation and treatment for chronic LE pain with occasional LBP. Physical findings are consistent with MD impression as pt demonstrates decrease in core and LE strength and functional mobility. LEFS score shows severe disability in performance of home ADLs and community activities. Pt would benefit from skilled PT services working on improving strength, activity tolerance, and mobility while decreasing pain.    OBJECTIVE IMPAIRMENTS: Abnormal gait, decreased activity tolerance, decreased endurance, decreased mobility, difficulty walking, decreased ROM, decreased strength, and pain  ACTIVITY LIMITATIONS: carrying, lifting, standing, squatting, stairs, transfers, and locomotion level  PARTICIPATION LIMITATIONS: meal prep, cleaning, driving, shopping, community activity, occupation, and yard work  PERSONAL FACTORS: Time since onset of injury/illness/exacerbation and 3+ comorbidities: L femur fx, HTN, CHF, breast cancer are also affecting patient's functional outcome.   REHAB POTENTIAL: Good  CLINICAL DECISION MAKING: Evolving/moderate complexity  EVALUATION COMPLEXITY: Moderate   GOALS: Goals reviewed with patient? No  SHORT TERM GOALS: Target date: 07/31/2023  Pt will be compliant and knowledgeable with initial HEP  for improved comfort and carryover Baseline: initial HEP given Goal status: MET 07/31/23  2.  Pt will self report LE pain no greater than 7/10 for improved comfort and functional ability Baseline: 10/10 at worst Goal status: Partially met 07/31/23  LONG TERM GOALS: Target date: 11/06/2023    Pt will improve LEFS to no less than 40/80 as proxy for functional improvement with home ADLs and higher level community activity with home ADLs and community activities Baseline: 33/80 08/23/2023: 44/80 Goal status: MET   2.  Pt will self report LE pain no greater than 3/10 for improved comfort and functional ability Baseline: 10/10 at worst 09/25/23: 5/10 at worst Goal status: IN PROGRESS   3.  Pt will increase 30 Second Sit to Stand rep count to no less than 8 reps for improved balance, strength, and functional mobility Baseline: 4 reps with UE 08/23/2023: 6 reps no UE 09/25/2023: 5 reps no UE Goal status: IN PROGRESS   4.  Pt will improve standing activity tolerance to no less than 20 minutes for improved comfort and functional ability with shopping and church activities Baseline: 4-5 minutes 08/23/2023: 15-20 minutes 09/25/2023: 15 minutes Goal status: IN PROGRESS  5.  Pt will improve all LE MMT to no less than 4/5 for improved functional mobility and decrease pain Baseline: see chart Goal status: MET   6. Pt will be decrease ODI disability score to no greater than 48% (24/80) as proxy for functional improvement Baseline: 29/50 - 58% disability Goal status: INITIAL  PLAN:  PT FREQUENCY: 2x/week  PT DURATION: 8 weeks  PLANNED INTERVENTIONS: 97164- PT Re-evaluation, 97110-Therapeutic exercises, 97530- Therapeutic activity, [id number]- Neuromuscular re-education, 97535- Self Care, [id number]- Manual therapy, [id number]- Gait training, [id number]- Aquatic Therapy, [id number]- Electrical stimulation (unattended), [id number]- Electrical stimulation (manual), 97016- Vasopneumatic device, Cryotherapy, and Moist heat.  PLAN  FOR NEXT SESSION: assess HEP response, strengthening in gym and aquatic environments, standing activity tolerances   Alm JAYSON Kingdom PT 09/26/23  8:24 AM

## 2023-09-25 NOTE — Progress Notes (Signed)

## 2023-09-26 ENCOUNTER — Telehealth: Payer: Self-pay

## 2023-09-26 ENCOUNTER — Telehealth: Payer: Self-pay | Admitting: Gastroenterology

## 2023-09-26 ENCOUNTER — Encounter (HOSPITAL_COMMUNITY): Payer: Self-pay | Admitting: Gastroenterology

## 2023-09-26 NOTE — Telephone Encounter (Signed)
 Pt verbally confirmed appt for 7/31

## 2023-09-26 NOTE — Telephone Encounter (Signed)
 Procedure:Colonoscopy Procedure date: 10/04/23 Procedure location: [facility] Arrival Time: 8:00 am Spoke with the patient Y/N: Yes Any prep concerns? No  Has the patient obtained the prep from the pharmacy ? No, will pick it up from the pharmacy today Do you have a care partner and transportation: Yes Any additional concerns? No

## 2023-09-27 ENCOUNTER — Inpatient Hospital Stay: Payer: 59 | Attending: Hematology and Oncology | Admitting: Hematology and Oncology

## 2023-09-27 VITALS — BP 122/89 | HR 76 | Temp 98.2°F | Resp 18 | Wt 263.1 lb

## 2023-09-27 DIAGNOSIS — Z923 Personal history of irradiation: Secondary | ICD-10-CM | POA: Insufficient documentation

## 2023-09-27 DIAGNOSIS — Z79811 Long term (current) use of aromatase inhibitors: Secondary | ICD-10-CM | POA: Insufficient documentation

## 2023-09-27 DIAGNOSIS — D0512 Intraductal carcinoma in situ of left breast: Secondary | ICD-10-CM | POA: Insufficient documentation

## 2023-09-27 DIAGNOSIS — Z79899 Other long term (current) drug therapy: Secondary | ICD-10-CM | POA: Diagnosis not present

## 2023-09-27 DIAGNOSIS — M8580 Other specified disorders of bone density and structure, unspecified site: Secondary | ICD-10-CM | POA: Diagnosis not present

## 2023-09-27 NOTE — Progress Notes (Signed)
 BRIEF ONCOLOGIC HISTORY:  Oncology History  DCIS (ductal carcinoma in situ) of breast  01/04/2022 Mammogram   Mammogram showed possible mass with associated calcifications in the left breast warranting further investigation.  Left breast diagnostic mammogram confirmed abnormality.  Ultrasound showed no sonographic correlate to the left breast mass.  No axillary adenopathy   01/24/2022 Pathology Results   Left breast needle core biopsy showed atypical ductal hyperplasia involving a papillary lesion.   03/16/2022 Definitive Surgery   Patient underwent left breast lumpectomy which showed a small focus of DCIS, intermediate grade, 1.5 mm, previous procedure related changes including focal squamous metaplasia, resection margins negative for DCIS.  Prognostic show ER +95% strong staining PR +95% strong staining    Genetic Testing   No pathogenic variants identified on the [facility] Multi-Cancer Panel. 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 The report date is 06/18/2022.  The Multi-Caner Panel offered by [facility] includes sequencing and/or deletion/duplication analysis of the following 70 genes:  AIP*, ALK, APC*, ATM*, AXIN2*, BAP1*, BARD1*, BLM*, BMPR1A*, BRCA1*, BRCA2*, BRIP1*, CDC73*, CDH1*, CDK4, CDKN1B*, CDKN2A, CHEK2*, CTNNA1*, DICER1*, EPCAM, EGFR, FH*, FLCN*, GREM1, HOXB13, KIT, LZTR1, MAX*, MBD4, MEN1*, MET, MITF, MLH1*, MSH2*, MSH3*, MSH6*, MUTYH*, NF1*, NF2*, NTHL1*, PALB2*, PDGFRA, PMS2*, POLD1*, POLE*, POT1*, PRKAR1A*, PTCH1*, PTEN*, RAD51C*, RAD51D*, RB1*, RET, SDHA*, SDHAF2*, SDHB*, SDHC*, SDHD*, SMAD4*, SMARCA4*, SMARCB1*, SMARCE1*, STK11*, SUFU*, TMEM127*, TP53*, TSC1*, TSC2*, VHL*. RNA analysis was not completed as the sample did not meet quality metrics.   05/11/2022 - 06/07/2022 Radiation Therapy     The patient initially received a dose of 42.56 Gy in 16 fractions to the left breast using whole-breast tangent fields. This was delivered using a 3-D conformal technique. The patient  then received a boost to the seroma. This delivered an additional 8 Gy in 96fractions using a 3 field photon technique due to the depth of the seroma. The total dose was 50.56 Gy.    05/2022 -  Anti-estrogen oral therapy   Anastrozole    09/25/2022 Cancer Staging   Staging form: Breast, AJCC 8th Edition - Pathologic: Stage 0 (pTis (DCIS), pN0, cM0, ER+, PR+) - Signed by Crawford Morna Pickle, NP on 09/25/2022 Stage prefix: Initial diagnosis     INTERVAL HISTORY:   Discussed the use of AI scribe software for clinical note transcription with the patient, who gave verbal consent to proceed.  History of Present Illness    The patient, with breast cancer and osteopenia, presents for follow-up while anastrozole . She is suffering from a bad back and pain through the leg which has gotten worse over time. She is undergoing Physical therapy which has helping a lot. Last mammogram in November 2024 neg. She didn't notice any breast changes since her last visit.  Rest of the pertinent 10 point ROS reviewed and negative PAST MEDICAL/SURGICAL HISTORY:  Past Medical History:  Diagnosis Date   Abnormal mammogram 08/28/2014   08/28/14 - Probable benign microcalcifications over the outer mid to lower left breast. Bi-Rads 3. F/U 6 months    Allergic rhinitis 06/22/2014   Arm pain, lateral, left 04/28/2016   Asthma    Breast cancer (HCC) 03/16/2022   Carpal tunnel syndrome 04/26/2006   Qualifier: Diagnosis of  By: Benjamine MD, Aaron     Femur fracture [facility]) 05/14/2021   Femur fracture, right (HCC) 05/14/2021   First degree AV block 09/22/2013   EKG 09/22/13    GANGLION CYST, WRIST, RIGHT 10/06/2008   Qualifier: Diagnosis of  By: Lucille  MD, Preston  Hypothyroidism    Imbalance 01/12/2017   Palpitations 09/22/2013   Right wrist pain 04/28/2016   Past Surgical History:  Procedure Laterality Date   ABDOMINAL HYSTERECTOMY     fibroids   BREAST BIOPSY Left 01/24/2022   MM LT BREAST BX W LOC  DEV 1ST LESION IMAGE BX SPEC STEREO GUIDE 01/24/2022 [facility]   BREAST BIOPSY  03/15/2022   MM LT RADIOACTIVE SEED LOC MAMMO GUIDE 03/15/2022 [facility]   BREAST LUMPECTOMY WITH RADIOACTIVE SEED LOCALIZATION Left 03/16/2022   Procedure: LEFT BREAST LUMPECTOMY WITH RADIOACTIVE SEED LOCALIZATION;  Surgeon: Vanderbilt Ned, MD;  Location: ;  Service: General;  Laterality: Left;   FEMUR IM NAIL Right 05/14/2021   Procedure: INTRAMEDULLARY (IM) RETROGRADE FEMORAL NAILING;  Surgeon: Beuford Anes, MD;  Location: [facility];  Service: Orthopedics;  Laterality: Right;     ALLERGIES:  No Known Allergies   CURRENT MEDICATIONS:  Outpatient Encounter Medications as of 09/27/2023  Medication Sig Note   albuterol  (VENTOLIN  HFA) 108 (90 Base) MCG/ACT inhaler Inhale 2 puffs into the lungs every 6 (six) hours as needed for wheezing or shortness of breath.    amLODipine  (NORVASC ) 5 MG tablet Take 1 tablet (5 mg total) by mouth at bedtime.    anastrozole  (ARIMIDEX ) 1 MG tablet Take 1 tablet by mouth once daily    carvedilol  (COREG ) 6.25 MG tablet TAKE 1 TABLET BY MOUTH TWICE DAILY WITH A MEAL    famotidine  (PEPCID ) 20 MG tablet TAKE 1 TABLET BY MOUTH ONCE DAILY AS NEEDED HEARTBURN  OR  INDIGESTION    gabapentin  (NEURONTIN ) 100 MG capsule Take 1 capsule by mouth twice daily    levothyroxine  (SYNTHROID ) 125 MCG tablet TAKE 1 TABLET BY MOUTH ONCE DAILY BEFORE BREAKFAST    Melatonin 5 MG CHEW Chew 5 mg by mouth at bedtime as needed.    sertraline  (ZOLOFT ) 50 MG tablet Take 1 tablet (50 mg total) by mouth daily.    Vitamin D , Cholecalciferol, 50 MCG (2000 UT) CAPS Take 2,000 Units by mouth daily.    ferrous sulfate  325 (65 FE) MG tablet Take 1 tablet (325 mg total) by mouth daily with breakfast. (Patient not taking: Reported on 09/27/2023) 03/09/2022: Not taking    fluticasone -salmeterol (ADVAIR  HFA) 115-21 MCG/ACT inhaler Inhale 2 puffs into the lungs 2 (two) times daily.  (Patient not taking: Reported on 09/27/2023)    polyethylene glycol (MIRALAX  / GLYCOLAX ) 17 g packet Take 17 g by mouth 2 (two) times daily. (Patient not taking: Reported on 09/27/2023)    No facility-administered encounter medications on file as of 09/27/2023.     ONCOLOGIC FAMILY HISTORY:  Family History  Problem Relation Age of Onset   Brain cancer Mother        cervial, pituitary, bone?   Hypertension Mother    Lung cancer Father    Alcohol abuse Father    Hypertension Sister    Hypothyroidism Sister    Hypertension Sister    Hypothyroidism Sister    Hypertension Sister    Hypothyroidism Sister    Hypertension Sister    Hypothyroidism Sister    Hypertension Sister    Hypothyroidism Sister    Prostate cancer Brother    Lung cancer Brother        lung   Ovarian cancer Maternal Aunt    Colon cancer Neg Hx    Rectal cancer Neg Hx    Stomach cancer Neg Hx      SOCIAL HISTORY:  Social History  Socioeconomic History   Marital status: Single    Spouse name: Not on file   Number of children: Not on file   Years of education: Not on file   Highest education level: Not on file  Occupational History   Not on file  Tobacco Use   Smoking status: Never   Smokeless tobacco: Never   Tobacco comments:    Tobacco smoke comes in through the house vent from outside  Vaping Use   Vaping status: Never Used  Substance and Sexual Activity   Alcohol use: No   Drug use: No   Sexual activity: Never    Birth control/protection: Surgical  Other Topics Concern   Not on file  Social History Narrative   Works for [employer], 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[employer], concessions   Social Drivers of Corporate investment banker Strain: Medium Risk (09/10/2023)   Overall Financial Resource Strain (CARDIA)    Difficulty of Paying Living Expenses: Somewhat hard  Food Insecurity: No Food Insecurity (09/24/2023)   Hunger Vital Sign    Worried About Running Out of Food in  the Last Year: Never true    Ran Out of Food in the Last Year: Never true  Recent Concern: Food Insecurity - Food Insecurity Present (09/10/2023)   Hunger Vital Sign    Worried About Running Out of Food in the Last Year: Sometimes true    Ran Out of Food in the Last Year: Sometimes true  Transportation Needs: No Transportation Needs (09/10/2023)   PRAPARE - Administrator, Civil Service (Medical): No    Lack of Transportation (Non-Medical): No  Recent Concern: Transportation Needs - Unmet Transportation Needs (08/15/2023)   PRAPARE - Transportation    Lack of Transportation (Medical): No    Lack of Transportation (Non-Medical): Yes  Physical Activity: Insufficiently Active (07/26/2023)   Exercise Vital Sign    Days of Exercise per Week: 1 day    Minutes of Exercise per Session: 60 min  Stress: No Stress Concern Present (07/26/2023)   [employer] of Occupational Health - Occupational Stress Questionnaire    Feeling of Stress : Not at all  Social Connections: Unknown (07/26/2023)   Social Connection and Isolation Panel    Frequency of Communication with Friends and Family: More than three times a week    Frequency of Social Gatherings with Friends and Family: Once a week    Attends Religious Services: More than 4 times per year    Active Member of [employer] or Organizations: Yes    Attends Banker Meetings: More than 4 times per year    Marital Status: Not on file  Intimate Partner Violence: Not At Risk (09/10/2023)   Humiliation, Afraid, Rape, and Kick questionnaire    Fear of Current or Ex-Partner: No    Emotionally Abused: No    Physically Abused: No    Sexually Abused: No     OBSERVATIONS/OBJECTIVE:  BP 122/89 (BP Location: Left Arm, Patient Position: Sitting)   Pulse 76   Temp 98.2 F (36.8 C) (Temporal)   Resp 18   Wt 263 lb 1.6 oz (119.3 kg)   SpO2 98%   BMI 46.61 kg/m   GENERAL: Patient is a well appearing female in no acute distress NODES:   No cervical, supraclavicular, or axillary lymphadenopathy palpated.  BREAST EXAM: Left breast status postlumpectomy and radiation, radiation changes noted,. No def palpable masses. No regional adenopathy.    LABORATORY DATA:  None for this visit.  DIAGNOSTIC IMAGING:  None for this visit.      ASSESSMENT AND PLAN:  Ms.. Geron is a pleasant 68 y.o. female with Stage 0 left breast DCIS, ER+/PR+, diagnosed in 02/2022, treated with lumpectomy, adjuvant radiation therapy, and anti-estrogen therapy with Anastrozole  beginning in 06/2022.    Breast Cancer On Anastrozole  with intermittent sharp breast pain, which is normal. Mammogram in November was normal. -Bilateral breast inspected and palpated today.  No concern for local recurrence.  No regional adenopathy.  Posttreatment changes noted in the left breast. -Continue Anastrozole . -Continue regular mammograms.  She is due for another mammogram in November 2025. Ordered.  Osteopenia Bone density scan last year showed osteopenia. Patient is taking Vitamin D  and Calcium . -Encourage regular walking and weight-bearing exercises. -Plan for repeat bone density scan later this year. Scheduled.  Follow-up in 6 months.  *Total Encounter Time as defined by the Centers for Medicare and Medicaid Services includes, in addition to the face-to-face time of a patient visit (documented in the note above) non-face-to-face time: obtaining and reviewing outside history, ordering and reviewing medications, tests or procedures, care coordination (communications with other health care professionals or caregivers) and documentation in the medical record.

## 2023-09-28 ENCOUNTER — Ambulatory Visit: Attending: Family Medicine

## 2023-09-28 DIAGNOSIS — M6281 Muscle weakness (generalized): Secondary | ICD-10-CM | POA: Insufficient documentation

## 2023-09-28 DIAGNOSIS — R2689 Other abnormalities of gait and mobility: Secondary | ICD-10-CM | POA: Insufficient documentation

## 2023-09-28 DIAGNOSIS — M79605 Pain in left leg: Secondary | ICD-10-CM | POA: Insufficient documentation

## 2023-09-28 DIAGNOSIS — M79604 Pain in right leg: Secondary | ICD-10-CM | POA: Insufficient documentation

## 2023-09-28 NOTE — Therapy (Signed)
 OUTPATIENT PHYSICAL THERAPY TREATMENT   Patient Name: Andrea [name] MRN: [id number] DOB:21-May-1955, 68 y.o., female Today's Date: 09/28/2023  END OF SESSION:  PT End of Session - 09/28/23 1135     Visit Number --    Number of Visits 24    Date for PT Re-Evaluation 11/06/23    Authorization Type UHC Dual Complete    PT Start Time --            Past Medical History:  Diagnosis Date   Abnormal mammogram 08/28/2014   08/28/14 - Probable benign microcalcifications over the outer mid to lower left breast. Bi-Rads 3. F/U 6 months    Allergic rhinitis 06/22/2014   Arm pain, lateral, left 04/28/2016   Asthma    Breast cancer (HCC) 03/16/2022   Carpal tunnel syndrome 04/26/2006   Qualifier: Diagnosis of  By: Benjamine MD, Aaron     Femur fracture [facility]) 05/14/2021   Femur fracture, right (HCC) 05/14/2021   First degree AV block 09/22/2013   EKG 09/22/13    GANGLION CYST, WRIST, RIGHT 10/06/2008   Qualifier: Diagnosis of  By: Lucille  MD, Taineisha     Hypothyroidism    Imbalance 01/12/2017   Palpitations 09/22/2013   Right wrist pain 04/28/2016   Past Surgical History:  Procedure Laterality Date   ABDOMINAL HYSTERECTOMY     fibroids   BREAST BIOPSY Left 01/24/2022   MM LT BREAST BX W LOC DEV 1ST LESION IMAGE BX SPEC STEREO GUIDE 01/24/2022 [facility]   BREAST BIOPSY  03/15/2022   MM LT RADIOACTIVE SEED LOC MAMMO GUIDE 03/15/2022 [facility]   BREAST LUMPECTOMY WITH RADIOACTIVE SEED LOCALIZATION Left 03/16/2022   Procedure: LEFT BREAST LUMPECTOMY WITH RADIOACTIVE SEED LOCALIZATION;  Surgeon: Vanderbilt Ned, MD;  Location: [facility];  Service: General;  Laterality: Left;   FEMUR IM NAIL Right 05/14/2021   Procedure: INTRAMEDULLARY (IM) RETROGRADE FEMORAL NAILING;  Surgeon: Beuford Anes, MD;  Location: [facility];  Service: Orthopedics;  Laterality: Right;   Patient Active Problem List   Diagnosis Date Noted   Lumbar radiculopathy 06/29/2023    CKD stage 3a, GFR 45-59 ml/min (HCC) 06/29/2023   Elevated liver enzymes 05/04/2023   Hip pain 04/17/2023   Insomnia 02/06/2023   Genetic testing 06/19/2022   Skin desquamation 06/14/2022   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   Normocytic anemia 07/12/2021   History of femur fracture 07/11/2021   Seasonal allergies 04/08/2021   Restrictive lung disease 03/25/2021   Prediabetes 03/25/2021   Thoracic aortic ectasia (HCC) 09/07/2020   Congestive heart failure (HCC) 08/03/2020   Esophageal reflux 08/14/2017   Vitamin D  deficiency 01/12/2017   Cataract 09/19/2013   Hypothyroidism 04/26/2006   Hyperlipidemia 04/26/2006   BMI 45.0-49.9, adult (HCC) 04/26/2006   Major depressive disorder, recurrent episode (HCC) 04/26/2006   HYPERTENSION, BENIGN SYSTEMIC 04/26/2006    PCP: Anders Otto DASEN, MD  REFERRING PROVIDER: Anders Otto DASEN, MD  REFERRING DIAG:  M54.16 (ICD-10-CM) - Lumbar radiculopathy M25.551 (ICD-10-CM) - Right hip pain  Rationale for Evaluation and Treatment: Rehabilitation  THERAPY DIAG:  Pain in right leg  Pain in left leg  Muscle weakness (generalized)  Other abnormalities of gait and mobility  ONSET DATE: Chronic  SUBJECTIVE:  SUBJECTIVE STATEMENT: Patient presents to PT reporting 10/10 RLE pain that radiates from her lower back to the foot. She is pushed back into pool area in wheelchair and is tearful. She states that the pain began this morning when she got up and has not gotten any better with OTC pain medication. She states this is very odd for her because she was not having any pain during or after her last PT session on land this week.  PERTINENT HISTORY:  L femur fx, HTN, CHF, breast cancer  PAIN:  Are you having pain?  Yes: NPRS scale:  1-2/10 Worst: 10/10 Pain location: bilateral LE (posterior/lateral hips) Pain description: sharp, numb Aggravating factors: walking, standing Relieving factors: medication   PRECAUTIONS: None  RED FLAGS: None   WEIGHT BEARING RESTRICTIONS: No  FALLS:  Has patient fallen in last 6 months? No  LIVING ENVIRONMENT: Lives with: lives with their family Lives in: House/apartment Stairs: Yes: External: 4 steps; on right going up Has following equipment at home: None  OCCUPATION: Retired Designer, industrial/product  PLOF: Independent  PATIENT GOALS: improve standing tolerance, decrease pain  NEXT MD VISIT: 08/14/2023  OBJECTIVE:  Note: Objective measures were completed at Evaluation unless otherwise noted.  DIAGNOSTIC FINDINGS:  See imaging for recent lumbar MRI  PATIENT SURVEYS:   LEFS: 33/80 08/23/2023: 44/80  ODI: 29/50 - 58% disability  COGNITION: Overall cognitive status: Within functional limits for tasks assessed     SENSATION: Light touch: Impaired - anterior shin R  MUSCLE LENGTH: Schley test: Right (+); Left (+)  POSTURE: rounded shoulders, forward head, increased lumbar lordosis, and large body habitus   PALPATION: TTP to bilateral posterior/lateral hip  LOWER EXTREMITY ROM:     Active  Right eval Left eval  Hip flexion    Hip extension    Hip abduction    Hip adduction    Hip internal rotation    Hip external rotation    Knee flexion [facility]  Knee extension    Ankle dorsiflexion    Ankle plantarflexion    Ankle inversion    Ankle eversion     (Blank rows = not tested)  LOWER EXTREMITY MMT:    MMT Right eval Left eval Right 09/25/23 Left 09/25/23  Hip flexion 4 3+    Hip extension      Hip abduction 3+ 3+ 4 4  Hip adduction      Hip internal rotation      Hip external rotation      Knee flexion 4 4    Knee extension 4 4    Ankle dorsiflexion      Ankle plantarflexion      Ankle inversion      Ankle eversion       (Blank rows = not  tested)  LUMBAR SPECIAL TESTS:  Straight leg raise test: Negative and Slump test: Negative  FUNCTIONAL TESTS:  30 Second Sit to Stand: 4 reps with UE 08/23/2023: 6 reps no UE 09/25/2023: 5 reps no UE  GAIT: Distance walked: 40ft Assistive device utilized: None Level of assistance: SBA Comments: flexed trunk, decreased gait speed  TREATMENT: [facility] Adult PT Treatment:                                                DATE: 09/28/23 Arrived-no charge for today's visit, see assessment.  [facility] Adult  PT Treatment:                                                DATE: 09/25/23 Nu-step L5 x 4 min UE/LE for functional activity tolerance Assessment of tests/measures, goals, and outcomes  Supine SLR x 15 ea Bridge 2x10 Hooklying clam x 15 black band LTR x 10 Repeated flexion in sitting with swiss ball x 15 Lateral flexion x 10 ea with swiss STS x 10 10# DB - high table  [facility] Adult PT Treatment:                                                DATE: 09/21/23 Aquatic therapy at [facility]- [address] - therapeutic pool temp approximately 91 degrees. Pt enters building ambulating independently. Treatment took place in water 3.8 to  4 ft 8 in. deep depending upon activity.  Pt entered and exited the pool via stair and handrails independently.  Aquatic Exercise: Walking forward/side stepping holding yellow noodle Step ups to bottom step with single rail use x5 BIL Standing with UE support edge of pool: Hip abd/add x10 BIL Hip ext x10 BIL Hamstring curl x20 BIL Squats 2x10 Heel/toe raises x10 ea Hip ext/flex with knee straight x 10 BIL Hip Circles CW/CCW x10 each BIL  Pt requires the buoyancy of water for active assisted exercises with buoyancy supported for strengthening and AROM exercises. Hydrostatic pressure also supports joints by unweighting joint load by at least 50 % in 3-4 feet depth water. 80% in chest to neck deep water. Water will provide assistance with movement using the current  and laminar flow while the buoyancy reduces weight bearing. Pt requires the viscosity of the water for resistance with strengthening exercises.   PATIENT EDUCATION:  Education details: HEP updates and POC extension Person educated: Patient Education method: Explanation, Demonstration, and Handouts Education comprehension: verbalized understanding and returned demonstration  HOME EXERCISE PROGRAM: Access Code: [email or web address] URL: [email or web address] Date: 08/23/2023 Prepared by: Alm Kingdom  Exercises - Supine Quadricep Sets  - 1 x daily - 7 x weekly - 2 sets - 10 reps - 5 sec hold - Active Straight Leg Raise with Quad Set  - 1 x daily - 7 x weekly - 2 sets - 10 reps - Hooklying Clamshell with Resistance  - 1 x daily - 7 x weekly - 2-3 sets - 15 reps - green band hold - Beginner Bridge  - 1 x daily - 7 x weekly - 2 sets - 10 reps - Sit to Stand with Hands on Knees  - 1 x daily - 7 x weekly - 2 sets - 10 reps - Standing Hip Abduction with Counter Support  - 1 x daily - 7 x weekly - 2 sets - 10 reps - Standing Hip Extension with Counter Support  - 1 x daily - 7 x weekly - 2 sets - 10 reps  ASSESSMENT:  CLINICAL IMPRESSION: Patient is brought back to aquatic area in wheelchair, tearful, and reporting 10/10 RLE pain that began this morning. She states she took OTC pain medication around 9AM but the pain has not decreased at all. She is in too much pain to participate in PT today. Offered for patient to  be transported downstairs to Emergency Department multiple times, but patient declined each time. Contacted family member to pick patient up from therapy. Patient departed therapy with family member, all questions answered, advised to follow up with her doctor as soon as possible.   Evaluation: Patient is a 68 y.o. F who was seen today for physical therapy evaluation and treatment for chronic LE pain with occasional LBP. Physical findings are consistent with MD impression as pt  demonstrates decrease in core and LE strength and functional mobility. LEFS score shows severe disability in performance of home ADLs and community activities. Pt would benefit from skilled PT services working on improving strength, activity tolerance, and mobility while decreasing pain.    OBJECTIVE IMPAIRMENTS: Abnormal gait, decreased activity tolerance, decreased endurance, decreased mobility, difficulty walking, decreased ROM, decreased strength, and pain  ACTIVITY LIMITATIONS: carrying, lifting, standing, squatting, stairs, transfers, and locomotion level  PARTICIPATION LIMITATIONS: meal prep, cleaning, driving, shopping, community activity, occupation, and yard work  PERSONAL FACTORS: Time since onset of injury/illness/exacerbation and 3+ comorbidities: L femur fx, HTN, CHF, breast cancer are also affecting patient's functional outcome.   REHAB POTENTIAL: Good  CLINICAL DECISION MAKING: Evolving/moderate complexity  EVALUATION COMPLEXITY: Moderate   GOALS: Goals reviewed with patient? No  SHORT TERM GOALS: Target date: 07/31/2023  Pt will be compliant and knowledgeable with initial HEP for improved comfort and carryover Baseline: initial HEP given Goal status: MET 07/31/23  2.  Pt will self report LE pain no greater than 7/10 for improved comfort and functional ability Baseline: 10/10 at worst Goal status: Partially met 07/31/23   LONG TERM GOALS: Target date: 11/06/2023    Pt will improve LEFS to no less than 40/80 as proxy for functional improvement with home ADLs and higher level community activity with home ADLs and community activities Baseline: 33/80 08/23/2023: 44/80 Goal status: MET   2.  Pt will self report LE pain no greater than 3/10 for improved comfort and functional ability Baseline: 10/10 at worst 09/25/23: 5/10 at worst Goal status: IN PROGRESS   3.  Pt will increase 30 Second Sit to Stand rep count to no less than 8 reps for improved balance, strength, and  functional mobility Baseline: 4 reps with UE 08/23/2023: 6 reps no UE 09/25/2023: 5 reps no UE Goal status: IN PROGRESS   4.  Pt will improve standing activity tolerance to no less than 20 minutes for improved comfort and functional ability with shopping and church activities Baseline: 4-5 minutes 08/23/2023: 15-20 minutes 09/25/2023: 15 minutes Goal status: IN PROGRESS  5.  Pt will improve all LE MMT to no less than 4/5 for improved functional mobility and decrease pain Baseline: see chart Goal status: MET   6. Pt will be decrease ODI disability score to no greater than 48% (24/80) as proxy for functional improvement Baseline: 29/50 - 58% disability Goal status: INITIAL  PLAN:  PT FREQUENCY: 2x/week  PT DURATION: 8 weeks  PLANNED INTERVENTIONS: 97164- PT Re-evaluation, 97110-Therapeutic exercises, 97530- Therapeutic activity, [id number]- Neuromuscular re-education, 97535- Self Care, [id number]- Manual therapy, [id number]- Gait training, [id number]- Aquatic Therapy, [id number]- Electrical stimulation (unattended), [id number]- Electrical stimulation (manual), 97016- Vasopneumatic device, Cryotherapy, and Moist heat.  PLAN FOR NEXT SESSION: assess HEP response, strengthening in gym and aquatic environments, standing activity tolerances   Corean Pouch PTA 09/28/23  12:15 PM

## 2023-10-03 NOTE — Anesthesia Preprocedure Evaluation (Signed)
 Anesthesia Evaluation  Patient identified by MRN, date of birth, ID band Patient awake    Reviewed: Allergy & Precautions, NPO status , Patient's Chart, lab work & pertinent test results  History of Anesthesia Complications Negative for: history of anesthetic complications  Airway Mallampati: II  TM Distance: >3 FB Neck ROM: Full    Dental  (+) Dental Advisory Given, Teeth Intact, Chipped,    Pulmonary asthma    Pulmonary exam normal breath sounds clear to auscultation       Cardiovascular hypertension, Pt. on home beta blockers and Pt. on medications +CHF  + dysrhythmias + Valvular Problems/Murmurs (Mod TR)  Rhythm:Irregular Rate:Normal  Echo 07/2021  1. Left ventricular ejection fraction, by estimation, is 55 to 60%. The left ventricle has normal function. The left ventricle has no regional wall motion abnormalities. Left ventricular diastolic function could not be evaluated.   2. Right ventricular systolic function is normal. The right ventricular size is mildly enlarged. There is normal pulmonary artery systolic pressure. The estimated right ventricular systolic pressure is 22.5 mmHg.   3. Left atrial size was severely dilated.   4. The mitral valve is normal in structure. Trivial mitral valve regurgitation.   5. Tricuspid valve regurgitation is moderate.   6. The aortic valve is tricuspid. Aortic valve regurgitation is trivial.   7. The inferior vena cava is normal in size with greater than 50% respiratory variability, suggesting right atrial pressure of 3 mmHg.   Comparison(s): No significant change from prior study. Prior images reviewed side by side.   '23 TTE - EF 55 to 60%. The right ventricular size is mildly enlarged. Left atrial size was severely dilated. Trivial mitral valve regurgitation. Tricuspid valve regurgitation is moderate. Aortic valve regurgitation is trivial.   Frequent PACs on monitor   Neuro/Psych   PSYCHIATRIC DISORDERS  Depression    negative neurological ROS     GI/Hepatic Neg liver ROS,GERD  Controlled,,  Endo/Other  Hypothyroidism  Class 3 obesity  Renal/GU Renal disease     Musculoskeletal negative musculoskeletal ROS (+)    Abdominal  (+) + obese  Peds  Hematology  (+) Blood dyscrasia, anemia   Anesthesia Other Findings   Reproductive/Obstetrics                              Anesthesia Physical Anesthesia Plan  ASA: 3  Anesthesia Plan: MAC   Post-op Pain Management: Tylenol  PO (pre-op)*   Induction: Intravenous  PONV Risk Score and Plan: 2 and Treatment may vary due to age or medical condition, Ondansetron , TIVA and Propofol  infusion  Airway Management Planned:   Additional Equipment: None  Intra-op Plan:   Post-operative Plan:   Informed Consent: I have reviewed the patients History and Physical, chart, labs and discussed the procedure including the risks, benefits and alternatives for the proposed anesthesia with the patient or authorized representative who has indicated his/her understanding and acceptance.     Dental advisory given  Plan Discussed with: CRNA  Anesthesia Plan Comments:          Anesthesia Quick Evaluation

## 2023-10-04 ENCOUNTER — Other Ambulatory Visit: Payer: Self-pay

## 2023-10-04 ENCOUNTER — Encounter (HOSPITAL_COMMUNITY)
Admission: RE | Disposition: A | Discharge status: Home / Self Care | Payer: Self-pay | Source: Home / Self Care | Attending: Gastroenterology

## 2023-10-04 ENCOUNTER — Ambulatory Visit (HOSPITAL_COMMUNITY)
Admission: RE | Admit: 2023-10-04 | Discharge: 2023-10-04 | Disposition: A | Discharge status: Home / Self Care | Attending: Gastroenterology | Admitting: Gastroenterology

## 2023-10-04 ENCOUNTER — Ambulatory Visit (HOSPITAL_COMMUNITY): Admitting: Anesthesiology

## 2023-10-04 DIAGNOSIS — I509 Heart failure, unspecified: Secondary | ICD-10-CM | POA: Insufficient documentation

## 2023-10-04 DIAGNOSIS — N289 Disorder of kidney and ureter, unspecified: Secondary | ICD-10-CM | POA: Insufficient documentation

## 2023-10-04 DIAGNOSIS — D120 Benign neoplasm of cecum: Secondary | ICD-10-CM | POA: Diagnosis not present

## 2023-10-04 DIAGNOSIS — Z5986 Financial insecurity: Secondary | ICD-10-CM | POA: Diagnosis not present

## 2023-10-04 DIAGNOSIS — N1831 Chronic kidney disease, stage 3a: Secondary | ICD-10-CM

## 2023-10-04 DIAGNOSIS — K219 Gastro-esophageal reflux disease without esophagitis: Secondary | ICD-10-CM | POA: Insufficient documentation

## 2023-10-04 DIAGNOSIS — E66813 Obesity, class 3: Secondary | ICD-10-CM | POA: Insufficient documentation

## 2023-10-04 DIAGNOSIS — F32A Depression, unspecified: Secondary | ICD-10-CM | POA: Diagnosis not present

## 2023-10-04 DIAGNOSIS — I130 Hypertensive heart and chronic kidney disease with heart failure and stage 1 through stage 4 chronic kidney disease, or unspecified chronic kidney disease: Secondary | ICD-10-CM | POA: Diagnosis not present

## 2023-10-04 DIAGNOSIS — E039 Hypothyroidism, unspecified: Secondary | ICD-10-CM | POA: Diagnosis not present

## 2023-10-04 DIAGNOSIS — K5730 Diverticulosis of large intestine without perforation or abscess without bleeding: Secondary | ICD-10-CM | POA: Diagnosis not present

## 2023-10-04 DIAGNOSIS — Z1211 Encounter for screening for malignant neoplasm of colon: Secondary | ICD-10-CM | POA: Insufficient documentation

## 2023-10-04 DIAGNOSIS — J45909 Unspecified asthma, uncomplicated: Secondary | ICD-10-CM | POA: Insufficient documentation

## 2023-10-04 DIAGNOSIS — Z6841 Body Mass Index (BMI) 40.0 and over, adult: Secondary | ICD-10-CM | POA: Diagnosis not present

## 2023-10-04 DIAGNOSIS — Z8249 Family history of ischemic heart disease and other diseases of the circulatory system: Secondary | ICD-10-CM | POA: Insufficient documentation

## 2023-10-04 DIAGNOSIS — D123 Benign neoplasm of transverse colon: Secondary | ICD-10-CM | POA: Diagnosis not present

## 2023-10-04 DIAGNOSIS — I110 Hypertensive heart disease with heart failure: Secondary | ICD-10-CM | POA: Diagnosis not present

## 2023-10-04 DIAGNOSIS — Z800 Family history of malignant neoplasm of digestive organs: Secondary | ICD-10-CM | POA: Diagnosis not present

## 2023-10-04 DIAGNOSIS — D126 Benign neoplasm of colon, unspecified: Secondary | ICD-10-CM

## 2023-10-04 HISTORY — PX: COLONOSCOPY: SHX5424

## 2023-10-04 SURGERY — COLONOSCOPY
Anesthesia: Monitor Anesthesia Care

## 2023-10-04 MED ORDER — ONDANSETRON HCL 4 MG/2ML IJ SOLN
INTRAMUSCULAR | Status: DC | PRN
  Administered 2023-10-04: 4 mg via INTRAVENOUS

## 2023-10-04 MED ORDER — GLYCOPYRROLATE PF 0.2 MG/ML IJ SOSY
PREFILLED_SYRINGE | INTRAMUSCULAR | Status: DC | PRN
  Administered 2023-10-04: .2 mg via INTRAVENOUS

## 2023-10-04 MED ORDER — EPHEDRINE SULFATE-NACL 50-0.9 MG/10ML-% IV SOSY
PREFILLED_SYRINGE | INTRAVENOUS | Status: DC | PRN
  Administered 2023-10-04 (×2): 10 mg via INTRAVENOUS

## 2023-10-04 MED ORDER — SODIUM CHLORIDE 0.9 % IV SOLN: INTRAVENOUS | Status: DC | PRN

## 2023-10-04 MED ORDER — FLEET ENEMA RE ENEM
1.0000 | ENEMA | Freq: Once | RECTAL | Status: AC
  Administered 2023-10-04: 1 via RECTAL

## 2023-10-04 MED ORDER — PROPOFOL 500 MG/50ML IV EMUL
INTRAVENOUS | Status: DC | PRN
Start: 2023-10-04 — End: 2023-10-04
  Administered 2023-10-04: 100 ug/kg/min via INTRAVENOUS

## 2023-10-04 MED ORDER — LIDOCAINE 2% (20 MG/ML) 5 ML SYRINGE
INTRAMUSCULAR | Status: DC | PRN
  Administered 2023-10-04: 100 mg via INTRAVENOUS

## 2023-10-04 MED ORDER — PROPOFOL 10 MG/ML IV BOLUS
INTRAVENOUS | Status: DC | PRN
  Administered 2023-10-04: 50 mg via INTRAVENOUS
  Administered 2023-10-04 (×2): 20 mg via INTRAVENOUS

## 2023-10-04 MED ORDER — FLEET ENEMA RE ENEM
ENEMA | RECTAL | Status: AC
  Filled 2023-10-04: qty 1

## 2023-10-04 NOTE — Discharge Instructions (Signed)

## 2023-10-04 NOTE — Op Note (Signed)
 [facility] Patient Name: Andrea [name] Procedure Date: 10/04/2023 MRN: [id number] Attending MD: Glendia BRAVO. Stacia , MD, [id number] Date of Birth: [birth year] CSN: [id number] Age: 68 Admit Type: Outpatient Procedure:                Colonoscopy Indications:              Screening in patient at increased risk: Colorectal                            cancer in brother 23 or older Providers:                Clinten Howk E. Stacia, MD, Particia Fischer, RN, Curtistine Bishop, Technician Referring MD:              Medicines:                Monitored Anesthesia Care Complications:            No immediate complications. Estimated Blood Loss:     Estimated blood loss was minimal. Procedure:                Pre-Anesthesia Assessment:                           - Prior to the procedure, a History and Physical                            was performed, and patient medications and                            allergies were reviewed. The patient's tolerance of                            previous anesthesia was also reviewed. The risks                            and benefits of the procedure and the sedation                            options and risks were discussed with the patient.                            All questions were answered, and informed consent                            was obtained. Prior Anticoagulants: The patient has                            taken no anticoagulant or antiplatelet agents. ASA                            Grade Assessment: III - A patient with severe  systemic disease. After reviewing the risks and                            benefits, the patient was deemed in satisfactory                            condition to undergo the procedure.                           After obtaining informed consent, the colonoscope                            was passed under direct vision. Throughout the                             procedure, the patient's blood pressure, pulse, and                            oxygen saturations were monitored continuously. The                            PCF-[id number] ([id number]) colonoscope was introduced                            through the anus and advanced to the the cecum,                            identified by appendiceal orifice and ileocecal                            valve. The CF-[id number] ([id number]) Olympus colonoscope                            was introduced through the anus and advanced to the                            sigmoid colon before being exchanged for a                            pediatric colonoscope. The colonoscopy was                            unusually difficult due to restricted mobility of                            the sigmoid colon, significant looping in the right                            colon, a tortuous colon and the patient's body                            habitus. Successful completion of the procedure was  aided by changing the patient to a supine position,                            using manual pressure, withdrawing and reinserting                            the scope and withdrawing the scope and replacing                            with the pediatric endoscope. The sigmoid colon was                            fixed, angulated and narrowed. The adult                            colonoscope was not able to traverse the sigmoid                            colon. A pediatric colonoscope was used to traverse                            the sigmoid colon, and significant looping and                            tortuosity was encountered in the right colon. The                            patient tolerated the procedure fairly well, but                            experienced upper airway obstruction resulting in                            prominent diaphragmatic breathing, causing                            respiratory variation  that made visualization and                            polypectomy much more difficult. The quality of the                            bowel preparation was adequate. The ileocecal                            valve, appendiceal orifice, and rectum were                            photographed. The bowel preparation used was SUPREP                            via split dose instruction. Scope In: 11:10:35 AM Scope Out: 12:14:01 PM Scope Withdrawal Time: 0 hours 47 minutes 14 seconds  Total Procedure Duration: 1 hour 3 minutes 26 seconds  Findings:      The perianal and digital rectal examinations were normal. Pertinent       negatives include normal sphincter tone and no palpable rectal lesions.      A 6 mm polyp was found in the cecum. The polyp was sessile. The polyp       was removed with a cold snare. Resection and retrieval were complete.       Estimated blood loss was minimal.      A 3 mm polyp was found in the cecum. The polyp was sessile. The polyp       was removed with a cold snare. Resection and retrieval were complete.       Estimated blood loss was minimal.      Two pedunculated and sessile polyps were found in the hepatic flexure.       The polyps were 2 to 10 mm in size. These polyps were removed with a       cold snare. Resection and retrieval were complete. Estimated blood loss       was minimal. These polyps were in very difficult locations for resection       due to looping and respiratory movements. Eventually supine position       provided a stable enough scope position to allow polypectomy.      Many medium-mouthed and small-mouthed diverticula were found in the       sigmoid colon and descending colon. There was narrowing of the colon in       association with the diverticular opening which precluded passage with       adult colonoscope.      The exam was otherwise normal throughout the examined colon.      The retroflexed view of the distal rectum and anal verge was  normal and       showed no anal or rectal abnormalities. Impression:               - One 6 mm polyp in the cecum, removed with a cold                            snare. Resected and retrieved.                           - One 3 mm polyp in the cecum, removed with a cold                            snare. Resected and retrieved.                           - Two 2 to 10 mm polyps at the hepatic flexure,                            removed with a cold snare. Resected and retrieved.                           - Severe diverticulosis in the sigmoid colon and in                            the descending  colon. There was narrowing of the                            colon in association with the diverticular opening.                           - The distal rectum and anal verge are normal on                            retroflexion view. Moderate Sedation:      Not Applicable - Patient had care per Anesthesia. Recommendation:           - Patient has a contact number available for                            emergencies. The signs and symptoms of potential                            delayed complications were discussed with the                            patient. Return to normal activities tomorrow.                            Written discharge instructions were provided to the                            patient.                           - Resume previous diet.                           - Await pathology results.                           - Repeat colonoscopy in 2-3 years for surveillance.                           - Recommend daily fiber supplement to reduce risk                            of diverticular complications.                           - Consider 2-day prep with next colonoscopy given                            borderline prep quality. Procedure Code(s):        --- Professional ---                           [phone], Colonoscopy, flexible; with removal of                            tumor(s),  polyp(s), or other lesion(s)  by snare                            technique Diagnosis Code(s):        --- Professional ---                           Z80.0, Family history of malignant neoplasm of                            digestive organs                           D12.0, Benign neoplasm of cecum                           D12.3, Benign neoplasm of transverse colon (hepatic                            flexure or splenic flexure)                           K57.30, Diverticulosis of large intestine without                            perforation or abscess without bleeding CPT copyright 2022 American Medical Association. All rights reserved. The codes documented in this report are preliminary and upon coder review may  be revised to meet current compliance requirements. Griffey Nicasio E. Stacia, MD 10/04/2023 12:29:45 PM This report has been signed electronically. Number of Addenda: 0

## 2023-10-04 NOTE — H&P (Signed)
 [facility] History and Physical   Primary Care Physician:  Anders Otto DASEN, MD   Reason for Procedure:   Colon cancer screening  Plan:    Screening colonoscopy     HPI: Andrea [name] is a 68 y.o. female undergoing screening colonoscopy.  She has  no chronic GI symptoms.  She had a colonoscopy in 2010 with sigmoid diverticulosis, otherwise normal. She tells me her brother was diagnosed with colon cancer in his 65s.   Past Medical History:  Diagnosis Date   Abnormal mammogram 08/28/2014   08/28/14 - Probable benign microcalcifications over the outer mid to lower left breast. Bi-Rads 3. F/U 6 months    Allergic rhinitis 06/22/2014   Arm pain, lateral, left 04/28/2016   Asthma    Breast cancer (HCC) 03/16/2022   Carpal tunnel syndrome 04/26/2006   Qualifier: Diagnosis of  By: Benjamine MD, Aaron     Femur fracture [facility]) 05/14/2021   Femur fracture, right (HCC) 05/14/2021   First degree AV block 09/22/2013   EKG 09/22/13    GANGLION CYST, WRIST, RIGHT 10/06/2008   Qualifier: Diagnosis of  By: Lucille  MD, Taineisha     Hypothyroidism    Imbalance 01/12/2017   Palpitations 09/22/2013   Right wrist pain 04/28/2016    Past Surgical History:  Procedure Laterality Date   ABDOMINAL HYSTERECTOMY     fibroids   BREAST BIOPSY Left 01/24/2022   MM LT BREAST BX W LOC DEV 1ST LESION IMAGE BX SPEC STEREO GUIDE 01/24/2022 [facility]   BREAST BIOPSY  03/15/2022   MM LT RADIOACTIVE SEED LOC MAMMO GUIDE 03/15/2022 [facility]   BREAST LUMPECTOMY WITH RADIOACTIVE SEED LOCALIZATION Left 03/16/2022   Procedure: LEFT BREAST LUMPECTOMY WITH RADIOACTIVE SEED LOCALIZATION;  Surgeon: Vanderbilt Debby, MD;  Location: [facility];  Service: General;  Laterality: Left;   FEMUR IM NAIL Right 05/14/2021   Procedure: INTRAMEDULLARY (IM) RETROGRADE FEMORAL NAILING;  Surgeon: Beuford Anes, MD;  Location: [facility];  Service: Orthopedics;  Laterality: Right;     Prior to Admission medications   Medication Sig Start Date End Date Taking? Authorizing Provider  amLODipine  (NORVASC ) 5 MG tablet Take 1 tablet (5 mg total) by mouth at bedtime. 05/04/23  Yes Anders Otto DASEN, MD  anastrozole  (ARIMIDEX ) 1 MG tablet Take 1 tablet by mouth once daily 09/25/23  Yes Iruku, Praveena, MD  carvedilol  (COREG ) 6.25 MG tablet TAKE 1 TABLET BY MOUTH TWICE DAILY WITH A MEAL 02/05/23  Yes [name], Kehinde T, MD  famotidine  (PEPCID ) 20 MG tablet TAKE 1 TABLET BY MOUTH ONCE DAILY AS NEEDED HEARTBURN  OR  INDIGESTION 08/20/23  Yes Anders Otto T, MD  gabapentin  (NEURONTIN ) 100 MG capsule Take 1 capsule by mouth twice daily 08/20/23  Yes [name], Kehinde T, MD  levothyroxine  (SYNTHROID ) 125 MCG tablet TAKE 1 TABLET BY MOUTH ONCE DAILY BEFORE BREAKFAST 06/19/23  Yes Anders Otto T, MD  Melatonin 5 MG CHEW Chew 5 mg by mouth at bedtime as needed. 02/06/23  Yes Anders Otto DASEN, MD  sertraline  (ZOLOFT ) 50 MG tablet Take 1 tablet (50 mg total) by mouth daily. 08/24/23  Yes Anders Otto DASEN, MD  Vitamin D , Cholecalciferol, 50 MCG (2000 UT) CAPS Take 2,000 Units by mouth daily.   Yes [provider]  albuterol  (VENTOLIN  HFA) 108 (90 Base) MCG/ACT inhaler Inhale 2 puffs into the lungs every 6 (six) hours as needed for wheezing or shortness of breath. 05/04/23   Anders Otto DASEN, MD  No current facility-administered medications for this encounter.    Allergies as of 05/23/2023   (No Known Allergies)    Family History  Problem Relation Age of Onset   Brain cancer Mother        cervial, pituitary, bone?   Hypertension Mother    Lung cancer Father    Alcohol abuse Father    Hypertension Sister    Hypothyroidism Sister    Hypertension Sister    Hypothyroidism Sister    Hypertension Sister    Hypothyroidism Sister    Hypertension Sister    Hypothyroidism Sister    Hypertension Sister    Hypothyroidism Sister    Prostate cancer Brother    Lung cancer Brother         lung   Ovarian cancer Maternal Aunt    Colon cancer Neg Hx    Rectal cancer Neg Hx    Stomach cancer Neg Hx     Social History   Socioeconomic History   Marital status: Single    Spouse name: Not on file   Number of children: Not on file   Years of education: Not on file   Highest education level: Not on file  Occupational History   Not on file  Tobacco Use   Smoking status: Never   Smokeless tobacco: Never   Tobacco comments:    Tobacco smoke comes in through the house vent from outside  Vaping Use   Vaping status: Never Used  Substance and Sexual Activity   Alcohol use: No   Drug use: No   Sexual activity: Never    Birth control/protection: Surgical  Other Topics Concern   Not on file  Social History Narrative   Works for [employer], 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[employer], concessions   Social Drivers of Corporate investment banker Strain: Medium Risk (09/10/2023)   Overall Financial Resource Strain (CARDIA)    Difficulty of Paying Living Expenses: Somewhat hard  Food Insecurity: No Food Insecurity (09/24/2023)   Hunger Vital Sign    Worried About Running Out of Food in the Last Year: Never true    Ran Out of Food in the Last Year: Never true  Recent Concern: Food Insecurity - Food Insecurity Present (09/10/2023)   Hunger Vital Sign    Worried About Running Out of Food in the Last Year: Sometimes true    Ran Out of Food in the Last Year: Sometimes true  Transportation Needs: No Transportation Needs (09/10/2023)   PRAPARE - Administrator, Civil Service (Medical): No    Lack of Transportation (Non-Medical): No  Recent Concern: Transportation Needs - Unmet Transportation Needs (08/15/2023)   PRAPARE - Transportation    Lack of Transportation (Medical): No    Lack of Transportation (Non-Medical): Yes  Physical Activity: Insufficiently Active (07/26/2023)   Exercise Vital Sign    Days of Exercise per Week: 1 day    Minutes of  Exercise per Session: 60 min  Stress: No Stress Concern Present (07/26/2023)   [employer] of Occupational Health - Occupational Stress Questionnaire    Feeling of Stress : Not at all  Social Connections: Unknown (07/26/2023)   Social Connection and Isolation Panel    Frequency of Communication with Friends and Family: More than three times a week    Frequency of Social Gatherings with Friends and Family: Once a week    Attends Religious Services: More than 4 times per year    Active Member  of Clubs or Organizations: Yes    Attends Banker Meetings: More than 4 times per year    Marital Status: Not on file  Intimate Partner Violence: Not At Risk (09/10/2023)   Humiliation, Afraid, Rape, and Kick questionnaire    Fear of Current or Ex-Partner: No    Emotionally Abused: No    Physically Abused: No    Sexually Abused: No    Review of Systems:  All other review of systems negative except as mentioned in the HPI.  Physical Exam: Vital signs BP (!) 154/71   Pulse 69   Temp (!) 97 F (36.1 C) (Temporal)   Resp 10   Ht 5' 3 (1.6 m)   Wt 119.3 kg   SpO2 97%   BMI 46.59 kg/m   General:   Alert,  Well-developed, well-nourished, pleasant and cooperative in NAD Airway:  Mallampati 3 Lungs:  Clear throughout to auscultation.   Heart:  Regular rate and rhythm; no murmurs, clicks, rubs,  or gallops. Abdomen:  Soft, nontender and nondistended. Normal bowel sounds.   Neuro/Psych:  Normal mood and affect. A and O x 3   Jahmez Bily E. Stacia, MD [facility]

## 2023-10-04 NOTE — Transfer of Care (Signed)
 Immediate Anesthesia Transfer of Care Note  Patient: Andrea [name]  Procedure(s) Performed: COLONOSCOPY  Patient Location: PACU and Endoscopy Unit  Anesthesia Type:MAC  Level of Consciousness: awake, alert , and oriented  Airway & Oxygen Therapy: Patient Spontanous Breathing and Patient connected to face mask oxygen  Post-op Assessment: Report given to RN and Post -op Vital signs reviewed and stable  Post vital signs: Reviewed and stable  Last Vitals:  Vitals Value Taken Time  BP 90/52   Temp    Pulse 78   Resp 14   SpO2 99%     Last Pain:  Vitals:   10/04/23 0902  TempSrc: Temporal  PainSc: 0-No pain         Complications: No notable events documented.

## 2023-10-04 NOTE — Anesthesia Postprocedure Evaluation (Signed)
 Anesthesia Post Note  Patient: Andrea [name]  Procedure(s) Performed: COLONOSCOPY     Patient location during evaluation: PACU Anesthesia Type: MAC Level of consciousness: awake and alert Pain management: pain level controlled Vital Signs Assessment: post-procedure vital signs reviewed and stable Respiratory status: spontaneous breathing Cardiovascular status: stable Anesthetic complications: no   No notable events documented.  Last Vitals:  Vitals:   10/04/23 1310 10/04/23 1320  BP: (!) 168/101   Pulse:  67  Resp: 17   Temp:    SpO2:  96%    Last Pain:  Vitals:   10/04/23 1258  TempSrc:   PainSc: 0-No pain                 Norleen Pope

## 2023-10-05 ENCOUNTER — Ambulatory Visit

## 2023-10-06 LAB — SURGICAL PATHOLOGY

## 2023-10-07 ENCOUNTER — Encounter (HOSPITAL_COMMUNITY): Payer: Self-pay | Admitting: Gastroenterology

## 2023-10-09 ENCOUNTER — Ambulatory Visit

## 2023-10-09 ENCOUNTER — Ambulatory Visit: Payer: Self-pay | Admitting: Gastroenterology

## 2023-10-09 DIAGNOSIS — R2689 Other abnormalities of gait and mobility: Secondary | ICD-10-CM

## 2023-10-09 DIAGNOSIS — M79604 Pain in right leg: Secondary | ICD-10-CM | POA: Diagnosis not present

## 2023-10-09 DIAGNOSIS — M6281 Muscle weakness (generalized): Secondary | ICD-10-CM | POA: Diagnosis not present

## 2023-10-09 DIAGNOSIS — M79605 Pain in left leg: Secondary | ICD-10-CM | POA: Diagnosis not present

## 2023-10-09 NOTE — Progress Notes (Signed)
 Ms. Goldblatt,   All four polyps that I removed during your recent procedure were completely benign but were proven to be pre-cancerous polyps that MAY have grown into cancers if they had not been removed.  Studies shows that at least 20% of women over age 67 and 30% of men over age 32 have pre-cancerous polyps.  Based on current nationally recognized surveillance guidelines, I recommend that you have a repeat colonoscopy in 3 years.   If you develop any new rectal bleeding, abdominal pain or significant bowel habit changes, please contact me before then.

## 2023-10-09 NOTE — Telephone Encounter (Signed)
 Patient returned call. Reviewed results and recall time frame. Patient verbalized understanding.

## 2023-10-09 NOTE — Therapy (Signed)
 OUTPATIENT PHYSICAL THERAPY TREATMENT   Patient Name: Andrea [name] MRN: [id number] DOB:1955/04/24, 68 y.o., female Today's Date: 10/09/2023  END OF SESSION:  PT End of Session - 10/09/23 1347     Visit Number 13    Number of Visits 24    Date for PT Re-Evaluation 11/06/23    Authorization Type [employer] Dual Complete    PT Start Time 1349    PT Stop Time 1430    PT Time Calculation (min) 41 min    Activity Tolerance No increased pain;Patient tolerated treatment well    Behavior During Therapy [facility] for tasks assessed/performed             Past Medical History:  Diagnosis Date   Abnormal mammogram 08/28/2014   08/28/14 - Probable benign microcalcifications over the outer mid to lower left breast. Bi-Rads 3. F/U 6 months    Allergic rhinitis 06/22/2014   Arm pain, lateral, left 04/28/2016   Asthma    Breast cancer (HCC) 03/16/2022   Carpal tunnel syndrome 04/26/2006   Qualifier: Diagnosis of  By: Benjamine MD, Aaron     Femur fracture [facility]) 05/14/2021   Femur fracture, right (HCC) 05/14/2021   First degree AV block 09/22/2013   EKG 09/22/13    GANGLION CYST, WRIST, RIGHT 10/06/2008   Qualifier: Diagnosis of  By: Lucille  MD, Taineisha     Hypothyroidism    Imbalance 01/12/2017   Palpitations 09/22/2013   Right wrist pain 04/28/2016   Past Surgical History:  Procedure Laterality Date   ABDOMINAL HYSTERECTOMY     fibroids   BREAST BIOPSY Left 01/24/2022   MM LT BREAST BX W LOC DEV 1ST LESION IMAGE BX SPEC STEREO GUIDE 01/24/2022 [facility]   BREAST BIOPSY  03/15/2022   MM LT RADIOACTIVE SEED LOC MAMMO GUIDE 03/15/2022 [facility]   BREAST LUMPECTOMY WITH RADIOACTIVE SEED LOCALIZATION Left 03/16/2022   Procedure: LEFT BREAST LUMPECTOMY WITH RADIOACTIVE SEED LOCALIZATION;  Surgeon: Vanderbilt Ned, MD;  Location: [facility];  Service: General;  Laterality: Left;   COLONOSCOPY N/A 10/04/2023   Procedure: COLONOSCOPY;  Surgeon: Stacia Glendia BRAVO, MD;  Location: [facility];  Service: Gastroenterology;  Laterality: N/A;   FEMUR IM NAIL Right 05/14/2021   Procedure: INTRAMEDULLARY (IM) RETROGRADE FEMORAL NAILING;  Surgeon: Beuford Anes, MD;  Location: [facility];  Service: Orthopedics;  Laterality: Right;   Patient Active Problem List   Diagnosis Date Noted   Family history of colon cancer 10/04/2023   Benign neoplasm of colon 10/04/2023   Lumbar radiculopathy 06/29/2023   CKD stage 3a, GFR 45-59 ml/min (HCC) 06/29/2023   Elevated liver enzymes 05/04/2023   Hip pain 04/17/2023   Insomnia 02/06/2023   Colon cancer screening 06/19/2022   Skin desquamation 06/14/2022   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   Normocytic anemia 07/12/2021   History of femur fracture 07/11/2021   Seasonal allergies 04/08/2021   Restrictive lung disease 03/25/2021   Prediabetes 03/25/2021   Thoracic aortic ectasia (HCC) 09/07/2020   Congestive heart failure (HCC) 08/03/2020   Esophageal reflux 08/14/2017   Vitamin D  deficiency 01/12/2017   Cataract 09/19/2013   Hypothyroidism 04/26/2006   Hyperlipidemia 04/26/2006   BMI 45.0-49.9, adult (HCC) 04/26/2006   Major depressive disorder, recurrent episode (HCC) 04/26/2006   HYPERTENSION, BENIGN SYSTEMIC 04/26/2006    PCP: Anders Otto DASEN, MD  REFERRING PROVIDER: Anders Otto DASEN, MD  REFERRING DIAG:  M54.16 (ICD-10-CM) - Lumbar radiculopathy M25.551 (ICD-10-CM) - Right hip pain  Rationale for Evaluation and Treatment: Rehabilitation  THERAPY DIAG:  Pain in right leg  Pain in left leg  Muscle weakness (generalized)  Other abnormalities of gait and mobility  ONSET DATE: Chronic  SUBJECTIVE:                                                                                                                                                                                           SUBJECTIVE STATEMENT: Pt states pain has been absent since last therapy session that she could  not participate in. Has not been compliant with HEP since last session. Went shopping yesterday, felt no discomfort until leaving after an hour of shopping.  Patient presents to PT reporting 10/10 RLE pain that radiates from her lower back to the foot. She is pushed back into pool area in wheelchair and is tearful. She states that the pain began this morning when she got up and has not gotten any better with OTC pain medication. She states this is very odd for her because she was not having any pain during or after her last PT session on land this week.  PERTINENT HISTORY:  L femur fx, HTN, CHF, breast cancer  PAIN:  Are you having pain?  Yes: NPRS scale: 1-2/10 Worst: 10/10 Pain location: bilateral LE (posterior/lateral hips) Pain description: sharp, numb Aggravating factors: walking, standing Relieving factors: medication   PRECAUTIONS: None  RED FLAGS: None   WEIGHT BEARING RESTRICTIONS: No  FALLS:  Has patient fallen in last 6 months? No  LIVING ENVIRONMENT: Lives with: lives with their family Lives in: House/apartment Stairs: Yes: External: 4 steps; on right going up Has following equipment at home: None  OCCUPATION: Retired Designer, industrial/product  PLOF: Independent  PATIENT GOALS: improve standing tolerance, decrease pain  NEXT MD VISIT: 08/14/2023  OBJECTIVE:  Note: Objective measures were completed at Evaluation unless otherwise noted.  DIAGNOSTIC FINDINGS:  See imaging for recent lumbar MRI  PATIENT SURVEYS:   LEFS: 33/80 08/23/2023: 44/80  ODI: 29/50 - 58% disability  COGNITION: Overall cognitive status: Within functional limits for tasks assessed     SENSATION: Light touch: Impaired - anterior shin R  MUSCLE LENGTH: Housey test: Right (+); Left (+)  POSTURE: rounded shoulders, forward head, increased lumbar lordosis, and large body habitus   PALPATION: TTP to bilateral posterior/lateral hip  LOWER EXTREMITY ROM:     Active  Right eval Left eval  Hip  flexion    Hip extension    Hip abduction    Hip adduction    Hip internal rotation    Hip external rotation    Knee flexion [facility]  Knee extension    Ankle dorsiflexion    Ankle plantarflexion    Ankle inversion    Ankle eversion     (Blank rows = not tested)  LOWER EXTREMITY MMT:    MMT Right eval Left eval Right 09/25/23 Left 09/25/23  Hip flexion 4 3+    Hip extension      Hip abduction 3+ 3+ 4 4  Hip adduction      Hip internal rotation      Hip external rotation      Knee flexion 4 4    Knee extension 4 4    Ankle dorsiflexion      Ankle plantarflexion      Ankle inversion      Ankle eversion       (Blank rows = not tested)  LUMBAR SPECIAL TESTS:  Straight leg raise test: Negative and Slump test: Negative  FUNCTIONAL TESTS:  30 Second Sit to Stand: 4 reps with UE 08/23/2023: 6 reps no UE 09/25/2023: 5 reps no UE  GAIT: Distance walked: 20ft Assistive device utilized: None Level of assistance: SBA Comments: flexed trunk, decreased gait speed  TREATMENT: [facility] Adult PT Treatment:                                                DATE: 10/09/23 Therapeutic Exercise Nu-step L4 x 4 min UE/LE for functional activity tolerance Supine SLR x 10 ea Bridge x10 LTR x 10 Repeated flexion in sitting with swiss ball x 10 Lateral flexion x 10 ea with swiss Standing abduction  Therapeutic Activities Minisquats at counter x 10 cues to bring hips back  Lateral walking at counter 2 laps  STS x 5 - high table UE use STS x 5 lowered table UE use Step ups 6 UE support x10 ea Lateral step up 4 with UE support x5  [facility] Adult PT Treatment:                                                DATE: 09/28/23 Arrived-no charge for today's visit, see assessment.  [facility] Adult PT Treatment:                                                DATE: 09/25/23 Nu-step L5 x 4 min UE/LE for functional activity tolerance Assessment of tests/measures, goals, and outcomes  Supine SLR x 10  ea Bridge 2x10 Hooklying clam x 15 black band LTR x 10 Repeated flexion in sitting with swiss ball x 15 Lateral flexion x 10 ea with swiss STS x 10 10# DB - high table  [facility] Adult PT Treatment:                                                DATE: 09/21/23 Aquatic therapy at [facility]- [address] - therapeutic pool temp approximately 91 degrees. Pt enters building ambulating independently. Treatment took place in water 3.8 to  4 ft 8 in.  deep depending upon activity.  Pt entered and exited the pool via stair and handrails independently.  Aquatic Exercise: Walking forward/side stepping holding yellow noodle Step ups to bottom step with single rail use x5 BIL Standing with UE support edge of pool: Hip abd/add x10 BIL Hip ext x10 BIL Hamstring curl x20 BIL Squats 2x10 Heel/toe raises x10 ea Hip ext/flex with knee straight x 10 BIL Hip Circles CW/CCW x10 each BIL  Pt requires the buoyancy of water for active assisted exercises with buoyancy supported for strengthening and AROM exercises. Hydrostatic pressure also supports joints by unweighting joint load by at least 50 % in 3-4 feet depth water. 80% in chest to neck deep water. Water will provide assistance with movement using the current and laminar flow while the buoyancy reduces weight bearing. Pt requires the viscosity of the water for resistance with strengthening exercises.   PATIENT EDUCATION:  Education details: HEP updates and POC extension Person educated: Patient Education method: Explanation, Demonstration, and Handouts Education comprehension: verbalized understanding and returned demonstration  HOME EXERCISE PROGRAM: Access Code: [email or web address] URL: [email or web address] Date: 08/23/2023 Prepared by: Alm Kingdom  Exercises - Supine Quadricep Sets  - 1 x daily - 7 x weekly - 2 sets - 10 reps - 5 sec hold - Active Straight Leg Raise with Quad Set  - 1 x daily - 7 x weekly - 2 sets - 10 reps -  Hooklying Clamshell with Resistance  - 1 x daily - 7 x weekly - 2-3 sets - 15 reps - green band hold - Beginner Bridge  - 1 x daily - 7 x weekly - 2 sets - 10 reps - Sit to Stand with Hands on Knees  - 1 x daily - 7 x weekly - 2 sets - 10 reps - Standing Hip Abduction with Counter Support  - 1 x daily - 7 x weekly - 2 sets - 10 reps - Standing Hip Extension with Counter Support  - 1 x daily - 7 x weekly - 2 sets - 10 reps  ASSESSMENT:  CLINICAL IMPRESSION: Ms Perfecto presented with minimal pain today after being able to perform PT at last aquatic session. Tolerated all exercises well with no reports of increased pain or symptoms. Ran though her HEP and exercises she can continue to perform at home to maintain her pain free status and keep improving functional strength. Pt continues to benefit from skilled PT to achieve greater functional strength and make progress toward goals.  Evaluation: Patient is a 68 y.o. F who was seen today for physical therapy evaluation and treatment for chronic LE pain with occasional LBP. Physical findings are consistent with MD impression as pt demonstrates decrease in core and LE strength and functional mobility. LEFS score shows severe disability in performance of home ADLs and community activities. Pt would benefit from skilled PT services working on improving strength, activity tolerance, and mobility while decreasing pain.    OBJECTIVE IMPAIRMENTS: Abnormal gait, decreased activity tolerance, decreased endurance, decreased mobility, difficulty walking, decreased ROM, decreased strength, and pain  ACTIVITY LIMITATIONS: carrying, lifting, standing, squatting, stairs, transfers, and locomotion level  PARTICIPATION LIMITATIONS: meal prep, cleaning, driving, shopping, community activity, occupation, and yard work  PERSONAL FACTORS: Time since onset of injury/illness/exacerbation and 3+ comorbidities: L femur fx, HTN, CHF, breast cancer are also affecting patient's  functional outcome.   REHAB POTENTIAL: Good  CLINICAL DECISION MAKING: Evolving/moderate complexity  EVALUATION COMPLEXITY: Moderate   GOALS: Goals reviewed with patient? No  SHORT TERM  GOALS: Target date: 07/31/2023  Pt will be compliant and knowledgeable with initial HEP for improved comfort and carryover Baseline: initial HEP given Goal status: MET 07/31/23  2.  Pt will self report LE pain no greater than 7/10 for improved comfort and functional ability Baseline: 10/10 at worst Goal status: Partially met 07/31/23   LONG TERM GOALS: Target date: 11/06/2023    Pt will improve LEFS to no less than 40/80 as proxy for functional improvement with home ADLs and higher level community activity with home ADLs and community activities Baseline: 33/80 08/23/2023: 44/80 Goal status: MET   2.  Pt will self report LE pain no greater than 3/10 for improved comfort and functional ability Baseline: 10/10 at worst 09/25/23: 5/10 at worst Goal status: IN PROGRESS   3.  Pt will increase 30 Second Sit to Stand rep count to no less than 8 reps for improved balance, strength, and functional mobility Baseline: 4 reps with UE 08/23/2023: 6 reps no UE 09/25/2023: 5 reps no UE Goal status: IN PROGRESS   4.  Pt will improve standing activity tolerance to no less than 20 minutes for improved comfort and functional ability with shopping and church activities Baseline: 4-5 minutes 08/23/2023: 15-20 minutes 09/25/2023: 15 minutes Goal status: IN PROGRESS  5.  Pt will improve all LE MMT to no less than 4/5 for improved functional mobility and decrease pain Baseline: see chart Goal status: MET   6. Pt will be decrease ODI disability score to no greater than 48% (24/80) as proxy for functional improvement Baseline: 29/50 - 58% disability Goal status: INITIAL  PLAN:  PT FREQUENCY: 2x/week  PT DURATION: 8 weeks  PLANNED INTERVENTIONS: 97164- PT Re-evaluation, 97110-Therapeutic exercises, 97530-  Therapeutic activity, [id number]- Neuromuscular re-education, 97535- Self Care, [id number]- Manual therapy, [id number]- Gait training, [id number]- Aquatic Therapy, [id number]- Electrical stimulation (unattended), [id number]- Electrical stimulation (manual), 97016- Vasopneumatic device, Cryotherapy, and Moist heat.  PLAN FOR NEXT SESSION: assess HEP response, strengthening in gym and aquatic environments, standing activity tolerances  Holly Schneider, SPT 10/09/23 2:33 PM

## 2023-10-10 ENCOUNTER — Other Ambulatory Visit: Payer: Self-pay | Admitting: Family Medicine

## 2023-10-14 ENCOUNTER — Other Ambulatory Visit: Payer: Self-pay | Admitting: Family Medicine

## 2023-10-16 ENCOUNTER — Ambulatory Visit

## 2023-10-16 DIAGNOSIS — R2689 Other abnormalities of gait and mobility: Secondary | ICD-10-CM | POA: Diagnosis not present

## 2023-10-16 DIAGNOSIS — M79605 Pain in left leg: Secondary | ICD-10-CM | POA: Diagnosis not present

## 2023-10-16 DIAGNOSIS — M6281 Muscle weakness (generalized): Secondary | ICD-10-CM | POA: Diagnosis not present

## 2023-10-16 DIAGNOSIS — M79604 Pain in right leg: Secondary | ICD-10-CM | POA: Diagnosis not present

## 2023-10-16 NOTE — Therapy (Signed)
 OUTPATIENT PHYSICAL THERAPY TREATMENT   Patient Name: Andrea [name] MRN: [id number] DOB:25-Nov-1955, 68 y.o., female Today's Date: 10/16/2023  END OF SESSION:  PT End of Session - 10/16/23 1052     Visit Number 14    Number of Visits 24    Date for PT Re-Evaluation 11/06/23    Authorization Type [employer] Dual Complete    PT Start Time 1054    PT Stop Time 1136    PT Time Calculation (min) 42 min    Activity Tolerance No increased pain;Patient tolerated treatment well    Behavior During Therapy [facility] for tasks assessed/performed              Past Medical History:  Diagnosis Date   Abnormal mammogram 08/28/2014   08/28/14 - Probable benign microcalcifications over the outer mid to lower left breast. Bi-Rads 3. F/U 6 months    Allergic rhinitis 06/22/2014   Arm pain, lateral, left 04/28/2016   Asthma    Breast cancer (HCC) 03/16/2022   Carpal tunnel syndrome 04/26/2006   Qualifier: Diagnosis of  By: Benjamine MD, Aaron     Femur fracture [facility]) 05/14/2021   Femur fracture, right (HCC) 05/14/2021   First degree AV block 09/22/2013   EKG 09/22/13    GANGLION CYST, WRIST, RIGHT 10/06/2008   Qualifier: Diagnosis of  By: Lucille  MD, Taineisha     Hypothyroidism    Imbalance 01/12/2017   Palpitations 09/22/2013   Right wrist pain 04/28/2016   Past Surgical History:  Procedure Laterality Date   ABDOMINAL HYSTERECTOMY     fibroids   BREAST BIOPSY Left 01/24/2022   MM LT BREAST BX W LOC DEV 1ST LESION IMAGE BX SPEC STEREO GUIDE 01/24/2022 [facility]   BREAST BIOPSY  03/15/2022   MM LT RADIOACTIVE SEED LOC MAMMO GUIDE 03/15/2022 [facility]   BREAST LUMPECTOMY WITH RADIOACTIVE SEED LOCALIZATION Left 03/16/2022   Procedure: LEFT BREAST LUMPECTOMY WITH RADIOACTIVE SEED LOCALIZATION;  Surgeon: Vanderbilt Ned, MD;  Location: [facility];  Service: General;  Laterality: Left;   COLONOSCOPY N/A 10/04/2023   Procedure: COLONOSCOPY;  Surgeon: Stacia Glendia BRAVO, MD;  Location: [facility];  Service: Gastroenterology;  Laterality: N/A;   FEMUR IM NAIL Right 05/14/2021   Procedure: INTRAMEDULLARY (IM) RETROGRADE FEMORAL NAILING;  Surgeon: Beuford Anes, MD;  Location: [facility];  Service: Orthopedics;  Laterality: Right;   Patient Active Problem List   Diagnosis Date Noted   Family history of colon cancer 10/04/2023   Benign neoplasm of colon 10/04/2023   Lumbar radiculopathy 06/29/2023   CKD stage 3a, GFR 45-59 ml/min (HCC) 06/29/2023   Elevated liver enzymes 05/04/2023   Hip pain 04/17/2023   Insomnia 02/06/2023   Colon cancer screening 06/19/2022   Skin desquamation 06/14/2022   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   Normocytic anemia 07/12/2021   History of femur fracture 07/11/2021   Seasonal allergies 04/08/2021   Restrictive lung disease 03/25/2021   Prediabetes 03/25/2021   Thoracic aortic ectasia (HCC) 09/07/2020   Congestive heart failure (HCC) 08/03/2020   Esophageal reflux 08/14/2017   Vitamin D  deficiency 01/12/2017   Cataract 09/19/2013   Hypothyroidism 04/26/2006   Hyperlipidemia 04/26/2006   BMI 45.0-49.9, adult (HCC) 04/26/2006   Major depressive disorder, recurrent episode (HCC) 04/26/2006   HYPERTENSION, BENIGN SYSTEMIC 04/26/2006    PCP: Anders Otto DASEN, MD  REFERRING PROVIDER: Anders Otto DASEN, MD  REFERRING DIAG:  M54.16 (ICD-10-CM) - Lumbar radiculopathy M25.551 (ICD-10-CM) - Right hip  pain  Rationale for Evaluation and Treatment: Rehabilitation  THERAPY DIAG:  Pain in right leg  Pain in left leg  Muscle weakness (generalized)  Other abnormalities of gait and mobility  ONSET DATE: Chronic  SUBJECTIVE:                                                                                                                                                                                           SUBJECTIVE STATEMENT: Felt numbness down her right leg in a restaurant a few days ago. No  major complaints. Went out yesterday walking for about an hour and a half with no complaints of pain or discomfort.  Patient presents to PT reporting 10/10 RLE pain that radiates from her lower back to the foot. She is pushed back into pool area in wheelchair and is tearful. She states that the pain began this morning when she got up and has not gotten any better with OTC pain medication. She states this is very odd for her because she was not having any pain during or after her last PT [place] land this week.   PERTINENT HISTORY:  L femur fx, HTN, CHF, breast cancer  PAIN:  Are you having pain?  Yes: NPRS scale: 1-2/10 Worst: 10/10 Pain location: bilateral LE (posterior/lateral hips) Pain description: sharp, numb Aggravating factors: walking, standing Relieving factors: medication   PRECAUTIONS: None  RED FLAGS: None   WEIGHT BEARING RESTRICTIONS: No  FALLS:  Has patient fallen in last 6 months? No  LIVING ENVIRONMENT: Lives with: lives with their family Lives in: House/apartment Stairs: Yes: External: 4 steps; on right going up Has following equipment at home: None  OCCUPATION: Retired Designer, industrial/product  PLOF: Independent  PATIENT GOALS: improve standing tolerance, decrease pain  NEXT MD VISIT: 08/14/2023  OBJECTIVE:  Note: Objective measures were completed at Evaluation unless otherwise noted.  DIAGNOSTIC FINDINGS:  See imaging for recent lumbar MRI  PATIENT SURVEYS:   LEFS: 33/80 08/23/2023: 44/80  ODI: 29/50 - 58% disability  COGNITION: Overall cognitive status: Within functional limits for tasks assessed     SENSATION: Light touch: Impaired - anterior shin R  MUSCLE LENGTH: Schleicher test: Right (+); Left (+)  POSTURE: rounded shoulders, forward head, increased lumbar lordosis, and large body habitus   PALPATION: TTP to bilateral posterior/lateral hip  LOWER EXTREMITY ROM:     Active  Right eval Left eval  Hip flexion    Hip extension    Hip  abduction    Hip adduction    Hip internal rotation    Hip external rotation    Knee flexion [facility]  Knee  extension    Ankle dorsiflexion    Ankle plantarflexion    Ankle inversion    Ankle eversion     (Blank rows = not tested)  LOWER EXTREMITY MMT:    MMT Right eval Left eval Right 09/25/23 Left 09/25/23  Hip flexion 4 3+    Hip extension      Hip abduction 3+ 3+ 4 4  Hip adduction      Hip internal rotation      Hip external rotation      Knee flexion 4 4    Knee extension 4 4    Ankle dorsiflexion      Ankle plantarflexion      Ankle inversion      Ankle eversion       (Blank rows = not tested)  LUMBAR SPECIAL TESTS:  Straight leg raise test: Negative and Slump test: Negative  FUNCTIONAL TESTS:  30 Second Sit to Stand: 4 reps with UE 08/23/2023: 6 reps no UE 09/25/2023: 5 reps no UE  GAIT: Distance walked: 38ft Assistive device utilized: None Level of assistance: SBA Comments: flexed trunk, decreased gait speed  TREATMENT: [facility] Adult PT Treatment:                                   DATE: 10/16/23 Therapeutic Exercise Nu-step L4 x 4 min UE/LE for functional activity tolerance Supine SLR x 10 ea Bridge x10 LTR x 10 TKE 5 # x 15 ea Repeated flexion rolls in sitting with swiss ball x 10 Lateral rolls x 10 ea with swiss ball Standing abduction 2x10 Standing hamstring curls 2x10  Therapeutic Activities Minisquats at counter x 10 cues to bring hips back  Lateral walking at counter 2 laps w/o UE support  STS 3 x 10  Step ups 6 UE support x10 ea Lateral step up 4 with UE support x5  [facility] Adult PT Treatment:                                                DATE: 10/09/23 Therapeutic Exercise Nu-step L4 x 4 min UE/LE for functional activity tolerance Supine SLR x 10 ea Bridge x10 LTR x 10 Repeated flexion in sitting with swiss ball x 10 Lateral flexion x 10 ea with swiss Standing abduction  Therapeutic Activities Minisquats at counter x 10 cues to  bring hips back  Lateral walking at counter 2 laps  STS x 5 - high table UE use STS x 5 lowered table UE use Step ups 6 UE support x10 ea Lateral step up 4 with UE support x5  [facility] Adult PT Treatment:                                                DATE: 09/28/23 Arrived-no charge for today's visit, see assessment.  [facility] Adult PT Treatment:                                                DATE: 09/25/23 Nu-step L5 x 4 min  UE/LE for functional activity tolerance Assessment of tests/measures, goals, and outcomes  Supine SLR x 10 ea Bridge 2x10 Hooklying clam x 15 black band LTR x 10 Repeated flexion in sitting with swiss ball x 15 Lateral flexion x 10 ea with swiss STS x 10 10# DB - high table  [facility] Adult PT Treatment:                                                DATE: 09/21/23 Aquatic therapy at [facility]- [address] - therapeutic pool temp approximately 91 degrees. Pt enters building ambulating independently. Treatment took place in water 3.8 to  4 ft 8 in. deep depending upon activity.  Pt entered and exited the pool via stair and handrails independently.  Aquatic Exercise: Walking forward/side stepping holding yellow noodle Step ups to bottom step with single rail use x5 BIL Standing with UE support edge of pool: Hip abd/add x10 BIL Hip ext x10 BIL Hamstring curl x20 BIL Squats 2x10 Heel/toe raises x10 ea Hip ext/flex with knee straight x 10 BIL Hip Circles CW/CCW x10 each BIL  Pt requires the buoyancy of water for active assisted exercises with buoyancy supported for strengthening and AROM exercises. Hydrostatic pressure also supports joints by unweighting joint load by at least 50 % in 3-4 feet depth water. 80% in chest to neck deep water. Water will provide assistance with movement using the current and laminar flow while the buoyancy reduces weight bearing. Pt requires the viscosity of the water for resistance with strengthening exercises.   PATIENT EDUCATION:   Education details: HEP updates and POC extension Person educated: Patient Education method: Explanation, Demonstration, and Handouts Education comprehension: verbalized understanding and returned demonstration  HOME EXERCISE PROGRAM: Access Code: [email or web address] URL: [email or web address] Date: 08/23/2023 Prepared by: Alm Kingdom  Exercises - Supine Quadricep Sets  - 1 x daily - 7 x weekly - 2 sets - 10 reps - 5 sec hold - Active Straight Leg Raise with Quad Set  - 1 x daily - 7 x weekly - 2 sets - 10 reps - Hooklying Clamshell with Resistance  - 1 x daily - 7 x weekly - 2-3 sets - 15 reps - green band hold - Beginner Bridge  - 1 x daily - 7 x weekly - 2 sets - 10 reps - Sit to Stand with Hands on Knees  - 1 x daily - 7 x weekly - 2 sets - 10 reps - Standing Hip Abduction with Counter Support  - 1 x daily - 7 x weekly - 2 sets - 10 reps - Standing Hip Extension with Counter Support  - 1 x daily - 7 x weekly - 2 sets - 10 reps  ASSESSMENT:  CLINICAL IMPRESSION: Ms Earlywine presented with minimal pain today and tolerated all exercises well with no reports of increased pain and minimal discomfort . Continued to emphasis importance of performing HEP to maintain current status. Focused on LE strength, and improving her functional activities performance. STSs were difficult but not painful. Pt continues to benefit from skilled PT to achieve greater functional strength, safety with ambulation and transfers and make progress toward goals.  Evaluation: Patient is a 68 y.o. F who was seen today for physical therapy evaluation and treatment for chronic LE pain with occasional LBP. Physical findings are consistent with MD impression as pt demonstrates decrease  in core and LE strength and functional mobility. LEFS score shows severe disability in performance of home ADLs and community activities. Pt would benefit from skilled PT services working on improving strength, activity tolerance, and  mobility while decreasing pain.    OBJECTIVE IMPAIRMENTS: Abnormal gait, decreased activity tolerance, decreased endurance, decreased mobility, difficulty walking, decreased ROM, decreased strength, and pain  ACTIVITY LIMITATIONS: carrying, lifting, standing, squatting, stairs, transfers, and locomotion level  PARTICIPATION LIMITATIONS: meal prep, cleaning, driving, shopping, community activity, occupation, and yard work  PERSONAL FACTORS: Time since onset of injury/illness/exacerbation and 3+ comorbidities: L femur fx, HTN, CHF, breast cancer are also affecting patient's functional outcome.   REHAB POTENTIAL: Good  CLINICAL DECISION MAKING: Evolving/moderate complexity  EVALUATION COMPLEXITY: Moderate   GOALS: Goals reviewed with patient? No  SHORT TERM GOALS: Target date: 07/31/2023  Pt will be compliant and knowledgeable with initial HEP for improved comfort and carryover Baseline: initial HEP given Goal status: MET 07/31/23  2.  Pt will self report LE pain no greater than 7/10 for improved comfort and functional ability Baseline: 10/10 at worst Goal status: Partially met 07/31/23   LONG TERM GOALS: Target date: 11/06/2023    Pt will improve LEFS to no less than 40/80 as proxy for functional improvement with home ADLs and higher level community activity with home ADLs and community activities Baseline: 33/80 08/23/2023: 44/80 Goal status: MET   2.  Pt will self report LE pain no greater than 3/10 for improved comfort and functional ability Baseline: 10/10 at worst 09/25/23: 5/10 at worst Goal status: IN PROGRESS   3.  Pt will increase 30 Second Sit to Stand rep count to no less than 8 reps for improved balance, strength, and functional mobility Baseline: 4 reps with UE 08/23/2023: 6 reps no UE 09/25/2023: 5 reps no UE Goal status: IN PROGRESS   4.  Pt will improve standing activity tolerance to no less than 20 minutes for improved comfort and functional ability with shopping  and church activities Baseline: 4-5 minutes 08/23/2023: 15-20 minutes 09/25/2023: 15 minutes Goal status: IN PROGRESS  5.  Pt will improve all LE MMT to no less than 4/5 for improved functional mobility and decrease pain Baseline: see chart Goal status: MET   6. Pt will be decrease ODI disability score to no greater than 48% (24/80) as proxy for functional improvement Baseline: 29/50 - 58% disability Goal status: INITIAL  PLAN:  PT FREQUENCY: 2x/week  PT DURATION: 8 weeks  PLANNED INTERVENTIONS: [id number]- PT Re-evaluation, 97110-Therapeutic exercises, 97530- Therapeutic activity, [id number]- Neuromuscular re-education, 97535- Self Care, [id number]- Manual therapy, [id number]- Gait training, [id number]- Aquatic Therapy, [id number]- Electrical stimulation (unattended), [id number]- Electrical stimulation (manual), 97016- Vasopneumatic device, Cryotherapy, and Moist heat.  PLAN FOR NEXT SESSION: assess HEP response, strengthening in gym and aquatic environments, standing activity tolerances  Holly Schneider, SPT 10/16/23 11:37 AM

## 2023-10-18 ENCOUNTER — Ambulatory Visit

## 2023-10-23 ENCOUNTER — Ambulatory Visit

## 2023-10-23 DIAGNOSIS — R2689 Other abnormalities of gait and mobility: Secondary | ICD-10-CM | POA: Diagnosis not present

## 2023-10-23 DIAGNOSIS — M79605 Pain in left leg: Secondary | ICD-10-CM | POA: Diagnosis not present

## 2023-10-23 DIAGNOSIS — M6281 Muscle weakness (generalized): Secondary | ICD-10-CM | POA: Diagnosis not present

## 2023-10-23 DIAGNOSIS — M79604 Pain in right leg: Secondary | ICD-10-CM | POA: Diagnosis not present

## 2023-10-23 NOTE — Therapy (Signed)
 OUTPATIENT PHYSICAL THERAPY TREATMENT   Patient Name: Andrea [name] MRN: [id number] DOB:1955/08/02, 68 y.o., female Today's Date: 10/23/2023  END OF SESSION:  PT End of Session - 10/23/23 1139     Visit Number 15    Number of Visits 24    Date for PT Re-Evaluation 11/06/23    Authorization Type [employer] Dual Complete    PT Start Time 1140    PT Stop Time 1220    PT Time Calculation (min) 40 min    Activity Tolerance No increased pain;Patient tolerated treatment well    Behavior During Therapy [facility] for tasks assessed/performed               Past Medical History:  Diagnosis Date   Abnormal mammogram 08/28/2014   08/28/14 - Probable benign microcalcifications over the outer mid to lower left breast. Bi-Rads 3. F/U 6 months    Allergic rhinitis 06/22/2014   Arm pain, lateral, left 04/28/2016   Asthma    Breast cancer (HCC) 03/16/2022   Carpal tunnel syndrome 04/26/2006   Qualifier: Diagnosis of  By: Benjamine MD, Aaron     Femur fracture [facility]) 05/14/2021   Femur fracture, right (HCC) 05/14/2021   First degree AV block 09/22/2013   EKG 09/22/13    GANGLION CYST, WRIST, RIGHT 10/06/2008   Qualifier: Diagnosis of  By: Lucille  MD, Taineisha     Hypothyroidism    Imbalance 01/12/2017   Palpitations 09/22/2013   Right wrist pain 04/28/2016   Past Surgical History:  Procedure Laterality Date   ABDOMINAL HYSTERECTOMY     fibroids   BREAST BIOPSY Left 01/24/2022   MM LT BREAST BX W LOC DEV 1ST LESION IMAGE BX SPEC STEREO GUIDE 01/24/2022 [facility]   BREAST BIOPSY  03/15/2022   MM LT RADIOACTIVE SEED LOC MAMMO GUIDE 03/15/2022 [facility]   BREAST LUMPECTOMY WITH RADIOACTIVE SEED LOCALIZATION Left 03/16/2022   Procedure: LEFT BREAST LUMPECTOMY WITH RADIOACTIVE SEED LOCALIZATION;  Surgeon: Vanderbilt Ned, MD;  Location: [facility];  Service: General;  Laterality: Left;   COLONOSCOPY N/A 10/04/2023   Procedure: COLONOSCOPY;  Surgeon: Stacia Glendia BRAVO, MD;  Location: [facility];  Service: Gastroenterology;  Laterality: N/A;   FEMUR IM NAIL Right 05/14/2021   Procedure: INTRAMEDULLARY (IM) RETROGRADE FEMORAL NAILING;  Surgeon: Beuford Anes, MD;  Location: [facility];  Service: Orthopedics;  Laterality: Right;   Patient Active Problem List   Diagnosis Date Noted   Family history of colon cancer 10/04/2023   Benign neoplasm of colon 10/04/2023   Lumbar radiculopathy 06/29/2023   CKD stage 3a, GFR 45-59 ml/min (HCC) 06/29/2023   Elevated liver enzymes 05/04/2023   Hip pain 04/17/2023   Insomnia 02/06/2023   Colon cancer screening 06/19/2022   Skin desquamation 06/14/2022   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   Normocytic anemia 07/12/2021   History of femur fracture 07/11/2021   Seasonal allergies 04/08/2021   Restrictive lung disease 03/25/2021   Prediabetes 03/25/2021   Thoracic aortic ectasia (HCC) 09/07/2020   Congestive heart failure (HCC) 08/03/2020   Esophageal reflux 08/14/2017   Vitamin D  deficiency 01/12/2017   Cataract 09/19/2013   Hypothyroidism 04/26/2006   Hyperlipidemia 04/26/2006   BMI 45.0-49.9, adult (HCC) 04/26/2006   Major depressive disorder, recurrent episode (HCC) 04/26/2006   HYPERTENSION, BENIGN SYSTEMIC 04/26/2006    PCP: Anders Otto DASEN, MD  REFERRING PROVIDER: Anders Otto DASEN, MD  REFERRING DIAG:  M54.16 (ICD-10-CM) - Lumbar radiculopathy M25.551 (ICD-10-CM) - Right  hip pain  Rationale for Evaluation and Treatment: Rehabilitation  THERAPY DIAG:  Pain in right leg  Pain in left leg  Muscle weakness (generalized)  Other abnormalities of gait and mobility  ONSET DATE: Chronic  SUBJECTIVE:                                                                                                                                                                                           SUBJECTIVE STATEMENT: Pt presents to PT no current pain, notes some numbness in bilateral  feet. Has been compliant with HEP.   Patient presents to PT reporting 10/10 RLE pain that radiates from her lower back to the foot. She is pushed back into pool area in wheelchair and is tearful. She states that the pain began this morning when she got up and has not gotten any better with OTC pain medication. She states this is very odd for her because she was not having any pain during or after her last PT session [place] this week.   PERTINENT HISTORY:  L femur fx, HTN, CHF, breast cancer  PAIN:  Are you having pain?  Yes: NPRS scale: 1-2/10 Worst: 10/10 Pain location: bilateral LE (posterior/lateral hips) Pain description: sharp, numb Aggravating factors: walking, standing Relieving factors: medication   PRECAUTIONS: None  RED FLAGS: None   WEIGHT BEARING RESTRICTIONS: No  FALLS:  Has patient fallen in last 6 months? No  LIVING ENVIRONMENT: Lives with: lives with their family Lives in: House/apartment Stairs: Yes: External: 4 steps; on right going up Has following equipment at home: None  OCCUPATION: Retired Designer, industrial/product  PLOF: Independent  PATIENT GOALS: improve standing tolerance, decrease pain  NEXT MD VISIT: 08/14/2023  OBJECTIVE:  Note: Objective measures were completed at Evaluation unless otherwise noted.  DIAGNOSTIC FINDINGS:  See imaging for recent lumbar MRI  PATIENT SURVEYS:   LEFS: 33/80 08/23/2023: 44/80  ODI: 29/50 - 58% disability  COGNITION: Overall cognitive status: Within functional limits for tasks assessed     SENSATION: Light touch: Impaired - anterior shin R  MUSCLE LENGTH: Hanssen test: Right (+); Left (+)  POSTURE: rounded shoulders, forward head, increased lumbar lordosis, and large body habitus   PALPATION: TTP to bilateral posterior/lateral hip  LOWER EXTREMITY ROM:     Active  Right eval Left eval  Hip flexion    Hip extension    Hip abduction    Hip adduction    Hip internal rotation    Hip external rotation     Knee flexion [facility]  Knee extension    Ankle dorsiflexion    Ankle plantarflexion  Ankle inversion    Ankle eversion     (Blank rows = not tested)  LOWER EXTREMITY MMT:    MMT Right eval Left eval Right 09/25/23 Left 09/25/23  Hip flexion 4 3+    Hip extension      Hip abduction 3+ 3+ 4 4  Hip adduction      Hip internal rotation      Hip external rotation      Knee flexion 4 4    Knee extension 4 4    Ankle dorsiflexion      Ankle plantarflexion      Ankle inversion      Ankle eversion       (Blank rows = not tested)  LUMBAR SPECIAL TESTS:  Straight leg raise test: Negative and Slump test: Negative  FUNCTIONAL TESTS:  30 Second Sit to Stand: 4 reps with UE 08/23/2023: 6 reps no UE 09/25/2023: 5 reps no UE  GAIT: Distance walked: 45ft Assistive device utilized: None Level of assistance: SBA Comments: flexed trunk, decreased gait speed  TREATMENT: [facility] Adult PT Treatment:                                   DATE: 10/23/23 Nu-step L5 x 4 min UE/LE for functional activity tolerance LAQ 2x15 5lb STS - unable today without pain Supine SLR 2x10 2# ea Bridge with ball 2x10 LTR x 10 Hooklying clamshell 2x15 black band Hooklying march 2x20 black band  [facility] Adult PT Treatment:                                   DATE: 10/16/23 Therapeutic Exercise Nu-step L4 x 4 min UE/LE for functional activity tolerance Supine SLR x 10 ea Bridge x10 LTR x 10 TKE 5 # x 15 ea Repeated flexion rolls in sitting with swiss ball x 10 Lateral rolls x 10 ea with swiss ball Standing abduction 2x10 Standing hamstring curls 2x10 Therapeutic Activities Minisquats at counter x 10 cues to bring hips back  Lateral walking at counter 2 laps w/o UE support  STS 3 x 10  Step ups 6 UE support x10 ea Lateral step up 4 with UE support x5  PATIENT EDUCATION:  Education details: HEP updates and POC extension Person educated: Patient Education method: Explanation, Demonstration, and  Handouts Education comprehension: verbalized understanding and returned demonstration  HOME EXERCISE PROGRAM: Access Code: [email or web address] URL: [email or web address] Date: 08/23/2023 Prepared by: Alm Kingdom  Exercises - Supine Quadricep Sets  - 1 x daily - 7 x weekly - 2 sets - 10 reps - 5 sec hold - Active Straight Leg Raise with Quad Set  - 1 x daily - 7 x weekly - 2 sets - 10 reps - Hooklying Clamshell with Resistance  - 1 x daily - 7 x weekly - 2-3 sets - 15 reps - green band hold - Beginner Bridge  - 1 x daily - 7 x weekly - 2 sets - 10 reps - Sit to Stand with Hands on Knees  - 1 x daily - 7 x weekly - 2 sets - 10 reps - Standing Hip Abduction with Counter Support  - 1 x daily - 7 x weekly - 2 sets - 10 reps - Standing Hip Extension with Counter Support  - 1 x daily - 7 x weekly - 2 sets - 10  reps  ASSESSMENT:  CLINICAL IMPRESSION: Pt was able to complete prescribed exercises, which were regressed slightly due to increased pain today. We continue to focus on core/hip strengthening and general functional mobility. Pt continues to benefit from skilled PT services, will continue per POC.   Evaluation: Patient is a 68 y.o. F who was seen today for physical therapy evaluation and treatment for chronic LE pain with occasional LBP. Physical findings are consistent with MD impression as pt demonstrates decrease in core and LE strength and functional mobility. LEFS score shows severe disability in performance of home ADLs and community activities. Pt would benefit from skilled PT services working on improving strength, activity tolerance, and mobility while decreasing pain.    OBJECTIVE IMPAIRMENTS: Abnormal gait, decreased activity tolerance, decreased endurance, decreased mobility, difficulty walking, decreased ROM, decreased strength, and pain  ACTIVITY LIMITATIONS: carrying, lifting, standing, squatting, stairs, transfers, and locomotion level  PARTICIPATION LIMITATIONS: meal  prep, cleaning, driving, shopping, community activity, occupation, and yard work  PERSONAL FACTORS: Time since onset of injury/illness/exacerbation and 3+ comorbidities: L femur fx, HTN, CHF, breast cancer are also affecting patient's functional outcome.   REHAB POTENTIAL: Good  CLINICAL DECISION MAKING: Evolving/moderate complexity  EVALUATION COMPLEXITY: Moderate   GOALS: Goals reviewed with patient? No  SHORT TERM GOALS: Target date: 07/31/2023  Pt will be compliant and knowledgeable with initial HEP for improved comfort and carryover Baseline: initial HEP given Goal status: MET 07/31/23  2.  Pt will self report LE pain no greater than 7/10 for improved comfort and functional ability Baseline: 10/10 at worst Goal status: Partially met 07/31/23   LONG TERM GOALS: Target date: 11/06/2023    Pt will improve LEFS to no less than 40/80 as proxy for functional improvement with home ADLs and higher level community activity with home ADLs and community activities Baseline: 33/80 08/23/2023: 44/80 Goal status: MET   2.  Pt will self report LE pain no greater than 3/10 for improved comfort and functional ability Baseline: 10/10 at worst 09/25/23: 5/10 at worst Goal status: IN PROGRESS   3.  Pt will increase 30 Second Sit to Stand rep count to no less than 8 reps for improved balance, strength, and functional mobility Baseline: 4 reps with UE 08/23/2023: 6 reps no UE 09/25/2023: 5 reps no UE Goal status: IN PROGRESS   4.  Pt will improve standing activity tolerance to no less than 20 minutes for improved comfort and functional ability with shopping and church activities Baseline: 4-5 minutes 08/23/2023: 15-20 minutes 09/25/2023: 15 minutes Goal status: IN PROGRESS  5.  Pt will improve all LE MMT to no less than 4/5 for improved functional mobility and decrease pain Baseline: see chart Goal status: MET   6. Pt will be decrease ODI disability score to no greater than 48% (24/80) as proxy  for functional improvement Baseline: 29/50 - 58% disability Goal status: INITIAL  PLAN:  PT FREQUENCY: 2x/week  PT DURATION: 8 weeks  PLANNED INTERVENTIONS: 97164- PT Re-evaluation, 97110-Therapeutic exercises, 97530- Therapeutic activity, [id number]- Neuromuscular re-education, 97535- Self Care, [id number]- Manual therapy, [id number]- Gait training, [id number]- Aquatic Therapy, [id number]- Electrical stimulation (unattended), [id number]- Electrical stimulation (manual), 97016- Vasopneumatic device, Cryotherapy, and Moist heat.  PLAN FOR NEXT SESSION: assess HEP response, strengthening in gym and aquatic environments, standing activity tolerances  Alm JAYSON Kingdom PT  10/23/23 5:05 PM

## 2023-10-25 ENCOUNTER — Ambulatory Visit

## 2023-10-25 DIAGNOSIS — M79604 Pain in right leg: Secondary | ICD-10-CM

## 2023-10-25 DIAGNOSIS — R2689 Other abnormalities of gait and mobility: Secondary | ICD-10-CM

## 2023-10-25 DIAGNOSIS — M79605 Pain in left leg: Secondary | ICD-10-CM | POA: Diagnosis not present

## 2023-10-25 DIAGNOSIS — M6281 Muscle weakness (generalized): Secondary | ICD-10-CM | POA: Diagnosis not present

## 2023-10-25 NOTE — Therapy (Signed)
 OUTPATIENT PHYSICAL THERAPY TREATMENT   Patient Name: Andrea [name] MRN: [id number] DOB:1955-07-22, 68 y.o., female Today's Date: 10/25/2023  END OF SESSION:  PT End of Session - 10/25/23 1359     Visit Number 16    Number of Visits 24    Date for PT Re-Evaluation 11/06/23    Authorization Type [employer] Dual Complete    PT Start Time 1357    PT Stop Time 1440    PT Time Calculation (min) 43 min    Activity Tolerance Patient tolerated treatment well;Patient limited by pain    Behavior During Therapy [facility] for tasks assessed/performed          Past Medical History:  Diagnosis Date   Abnormal mammogram 08/28/2014   08/28/14 - Probable benign microcalcifications over the outer mid to lower left breast. Bi-Rads 3. F/U 6 months    Allergic rhinitis 06/22/2014   Arm pain, lateral, left 04/28/2016   Asthma    Breast cancer (HCC) 03/16/2022   Carpal tunnel syndrome 04/26/2006   Qualifier: Diagnosis of  By: Benjamine MD, Aaron     Femur fracture [facility]) 05/14/2021   Femur fracture, right (HCC) 05/14/2021   First degree AV block 09/22/2013   EKG 09/22/13    GANGLION CYST, WRIST, RIGHT 10/06/2008   Qualifier: Diagnosis of  By: Lucille  MD, Taineisha     Hypothyroidism    Imbalance 01/12/2017   Palpitations 09/22/2013   Right wrist pain 04/28/2016   Past Surgical History:  Procedure Laterality Date   ABDOMINAL HYSTERECTOMY     fibroids   BREAST BIOPSY Left 01/24/2022   MM LT BREAST BX W LOC DEV 1ST LESION IMAGE BX SPEC STEREO GUIDE 01/24/2022 [facility]   BREAST BIOPSY  03/15/2022   MM LT RADIOACTIVE SEED LOC MAMMO GUIDE 03/15/2022 [facility]   BREAST LUMPECTOMY WITH RADIOACTIVE SEED LOCALIZATION Left 03/16/2022   Procedure: LEFT BREAST LUMPECTOMY WITH RADIOACTIVE SEED LOCALIZATION;  Surgeon: Vanderbilt Ned, MD;  Location: [facility];  Service: General;  Laterality: Left;   COLONOSCOPY N/A 10/04/2023   Procedure: COLONOSCOPY;  Surgeon: Stacia Glendia BRAVO, MD;  Location: [facility];  Service: Gastroenterology;  Laterality: N/A;   FEMUR IM NAIL Right 05/14/2021   Procedure: INTRAMEDULLARY (IM) RETROGRADE FEMORAL NAILING;  Surgeon: Beuford Anes, MD;  Location: [facility];  Service: Orthopedics;  Laterality: Right;   Patient Active Problem List   Diagnosis Date Noted   Family history of colon cancer 10/04/2023   Benign neoplasm of colon 10/04/2023   Lumbar radiculopathy 06/29/2023   CKD stage 3a, GFR 45-59 ml/min (HCC) 06/29/2023   Elevated liver enzymes 05/04/2023   Hip pain 04/17/2023   Insomnia 02/06/2023   Colon cancer screening 06/19/2022   Skin desquamation 06/14/2022   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   Normocytic anemia 07/12/2021   History of femur fracture 07/11/2021   Seasonal allergies 04/08/2021   Restrictive lung disease 03/25/2021   Prediabetes 03/25/2021   Thoracic aortic ectasia (HCC) 09/07/2020   Congestive heart failure (HCC) 08/03/2020   Esophageal reflux 08/14/2017   Vitamin D  deficiency 01/12/2017   Cataract 09/19/2013   Hypothyroidism 04/26/2006   Hyperlipidemia 04/26/2006   BMI 45.0-49.9, adult (HCC) 04/26/2006   Major depressive disorder, recurrent episode (HCC) 04/26/2006   HYPERTENSION, BENIGN SYSTEMIC 04/26/2006    PCP: Anders Otto DASEN, MD  REFERRING PROVIDER: Anders Otto DASEN, MD  REFERRING DIAG:  M54.16 (ICD-10-CM) - Lumbar radiculopathy M25.551 (ICD-10-CM) - Right hip pain  Rationale  for Evaluation and Treatment: Rehabilitation  THERAPY DIAG:  Pain in right leg  Pain in left leg  Muscle weakness (generalized)  Other abnormalities of gait and mobility  ONSET DATE: Chronic  SUBJECTIVE:                                                                                                                                                                                           SUBJECTIVE STATEMENT: Patient reports increased pain after walking back to pool.   Patient  presents to PT reporting 10/10 RLE pain that radiates from her lower back to the foot. She is pushed back into pool area in wheelchair and is tearful. She states that the pain began this morning when she got up and has not gotten any better with OTC pain medication. She states this is very odd for her because she was not having any pain during or after her last PT session on land this week.   PERTINENT HISTORY:  L femur fx, HTN, CHF, breast cancer  PAIN:  Are you having pain?  Yes: NPRS scale: 1-2/10 Worst: 10/10 Pain location: bilateral LE (posterior/lateral hips) Pain description: sharp, numb Aggravating factors: walking, standing Relieving factors: medication   PRECAUTIONS: None  RED FLAGS: None   WEIGHT BEARING RESTRICTIONS: No  FALLS:  Has patient fallen in last 6 months? No  LIVING ENVIRONMENT: Lives with: lives with their family Lives in: House/apartment Stairs: Yes: External: 4 steps; on right going up Has following equipment at home: None  OCCUPATION: Retired Designer, industrial/product  PLOF: Independent  PATIENT GOALS: improve standing tolerance, decrease pain  NEXT MD VISIT: 08/14/2023  OBJECTIVE:  Note: Objective measures were completed at Evaluation unless otherwise noted.  DIAGNOSTIC FINDINGS:  See imaging for recent lumbar MRI  PATIENT SURVEYS:   LEFS: 33/80 08/23/2023: 44/80  ODI: 29/50 - 58% disability  COGNITION: Overall cognitive status: Within functional limits for tasks assessed     SENSATION: Light touch: Impaired - anterior shin R  MUSCLE LENGTH: Dimiceli test: Right (+); Left (+)  POSTURE: rounded shoulders, forward head, increased lumbar lordosis, and large body habitus   PALPATION: TTP to bilateral posterior/lateral hip  LOWER EXTREMITY ROM:     Active  Right eval Left eval  Hip flexion    Hip extension    Hip abduction    Hip adduction    Hip internal rotation    Hip external rotation    Knee flexion [facility]  Knee extension    Ankle  dorsiflexion    Ankle plantarflexion    Ankle inversion    Ankle eversion     (Blank rows =  not tested)  LOWER EXTREMITY MMT:    MMT Right eval Left eval Right 09/25/23 Left 09/25/23  Hip flexion 4 3+    Hip extension      Hip abduction 3+ 3+ 4 4  Hip adduction      Hip internal rotation      Hip external rotation      Knee flexion 4 4    Knee extension 4 4    Ankle dorsiflexion      Ankle plantarflexion      Ankle inversion      Ankle eversion       (Blank rows = not tested)  LUMBAR SPECIAL TESTS:  Straight leg raise test: Negative and Slump test: Negative  FUNCTIONAL TESTS:  30 Second Sit to Stand: 4 reps with UE 08/23/2023: 6 reps no UE 09/25/2023: 5 reps no UE  GAIT: Distance walked: 39ft Assistive device utilized: None Level of assistance: SBA Comments: flexed trunk, decreased gait speed  TREATMENT: [facility] Adult PT Treatment:                                                DATE: 10/25/23 Aquatic therapy at [facility]- [address] - therapeutic pool temp approximately 91 degrees. Pt enters building ambulating independently. Treatment took place in water 3.8 to  4 ft 8 in. deep depending upon activity.  Pt entered and exited the pool via stair and handrails independently but with step to pattern and increased guarding and decreased pace.  Aquatic Exercise: Walking forward/side stepping holding yellow noodle x 2 laps each Step ups to bottom step with single rail use x5 BIL Standing with UE support edge of pool: Hip abd/add x10 BIL Hip ext x10 BIL Hamstring curl x10 BIL Squats 2x10 Heel/toe raises 2x10 Hip ext/flex with knee straight x 10 BIL Hip Circles CW/CCW x10 each BIL  Pt requires the buoyancy of water for active assisted exercises with buoyancy supported for strengthening and AROM exercises. Hydrostatic pressure also supports joints by unweighting joint load by at least 50 % in 3-4 feet depth water. 80% in chest to neck deep water. Water will provide  assistance with movement using the current and laminar flow while the buoyancy reduces weight bearing. Pt requires the viscosity of the water for resistance with strengthening exercises.  [facility] Adult PT Treatment:                                   DATE: 10/23/23 Nu-step L5 x 4 min UE/LE for functional activity tolerance LAQ 2x15 5lb STS - unable today without pain Supine SLR 2x10 2# ea Bridge with ball 2x10 LTR x 10 Hooklying clamshell 2x15 black band Hooklying march 2x20 black band  [facility] Adult PT Treatment:                                   DATE: 10/16/23 Therapeutic Exercise Nu-step L4 x 4 min UE/LE for functional activity tolerance Supine SLR x 10 ea Bridge x10 LTR x 10 TKE 5 # x 15 ea Repeated flexion rolls in sitting with swiss ball x 10 Lateral rolls x 10 ea with swiss ball Standing abduction 2x10 Standing hamstring curls 2x10 Therapeutic Activities Minisquats at counter x  10 cues to bring hips back  Lateral walking at counter 2 laps w/o UE support  STS 3 x 10  Step ups 6 UE support x10 ea Lateral step up 4 with UE support x5  PATIENT EDUCATION:  Education details: HEP updates and POC extension Person educated: Patient Education method: Explanation, Demonstration, and Handouts Education comprehension: verbalized understanding and returned demonstration  HOME EXERCISE PROGRAM: Access Code: [email or web address] URL: [email or web address] Date: 08/23/2023 Prepared by: Alm Kingdom  Exercises - Supine Quadricep Sets  - 1 x daily - 7 x weekly - 2 sets - 10 reps - 5 sec hold - Active Straight Leg Raise with Quad Set  - 1 x daily - 7 x weekly - 2 sets - 10 reps - Hooklying Clamshell with Resistance  - 1 x daily - 7 x weekly - 2-3 sets - 15 reps - green band hold - Beginner Bridge  - 1 x daily - 7 x weekly - 2 sets - 10 reps - Sit to Stand with Hands on Knees  - 1 x daily - 7 x weekly - 2 sets - 10 reps - Standing Hip Abduction with Counter Support  - 1 x daily - 7 x  weekly - 2 sets - 10 reps - Standing Hip Extension with Counter Support  - 1 x daily - 7 x weekly - 2 sets - 10 reps  ASSESSMENT:  CLINICAL IMPRESSION: Patient presents to aquatic PT session reporting increased pain, LLE>RLE, from ambulating to pool. Session today focused on improving overall activity tolerance and LE strengthening in the aquatic environment for use of buoyancy to offload joints and the viscosity of water as resistance during therapeutic exercise. Patient was able to tolerate all prescribed exercises in the aquatic environment with no adverse effects. She reports decreased pain in her LE upon entering the pool and completing exercises. Patient continues to benefit from skilled PT services on land and aquatic based and should be progressed as able to improve functional independence.   Evaluation: Patient is a 68 y.o. F who was seen today for physical therapy evaluation and treatment for chronic LE pain with occasional LBP. Physical findings are consistent with MD impression as pt demonstrates decrease in core and LE strength and functional mobility. LEFS score shows severe disability in performance of home ADLs and community activities. Pt would benefit from skilled PT services working on improving strength, activity tolerance, and mobility while decreasing pain.    OBJECTIVE IMPAIRMENTS: Abnormal gait, decreased activity tolerance, decreased endurance, decreased mobility, difficulty walking, decreased ROM, decreased strength, and pain  ACTIVITY LIMITATIONS: carrying, lifting, standing, squatting, stairs, transfers, and locomotion level  PARTICIPATION LIMITATIONS: meal prep, cleaning, driving, shopping, community activity, occupation, and yard work  PERSONAL FACTORS: Time since onset of injury/illness/exacerbation and 3+ comorbidities: L femur fx, HTN, CHF, breast cancer are also affecting patient's functional outcome.   REHAB POTENTIAL: Good  CLINICAL DECISION MAKING:  Evolving/moderate complexity  EVALUATION COMPLEXITY: Moderate   GOALS: Goals reviewed with patient? No  SHORT TERM GOALS: Target date: 07/31/2023  Pt will be compliant and knowledgeable with initial HEP for improved comfort and carryover Baseline: initial HEP given Goal status: MET 07/31/23  2.  Pt will self report LE pain no greater than 7/10 for improved comfort and functional ability Baseline: 10/10 at worst Goal status: Partially met 07/31/23   LONG TERM GOALS: Target date: 11/06/2023    Pt will improve LEFS to no less than 40/80 as proxy for functional improvement with home ADLs and  higher level community activity with home ADLs and community activities Baseline: 33/80 08/23/2023: 44/80 Goal status: MET   2.  Pt will self report LE pain no greater than 3/10 for improved comfort and functional ability Baseline: 10/10 at worst 09/25/23: 5/10 at worst Goal status: IN PROGRESS   3.  Pt will increase 30 Second Sit to Stand rep count to no less than 8 reps for improved balance, strength, and functional mobility Baseline: 4 reps with UE 08/23/2023: 6 reps no UE 09/25/2023: 5 reps no UE Goal status: IN PROGRESS   4.  Pt will improve standing activity tolerance to no less than 20 minutes for improved comfort and functional ability with shopping and church activities Baseline: 4-5 minutes 08/23/2023: 15-20 minutes 09/25/2023: 15 minutes Goal status: IN PROGRESS  5.  Pt will improve all LE MMT to no less than 4/5 for improved functional mobility and decrease pain Baseline: see chart Goal status: MET   6. Pt will be decrease ODI disability score to no greater than 48% (24/80) as proxy for functional improvement Baseline: 29/50 - 58% disability Goal status: INITIAL  PLAN:  PT FREQUENCY: 2x/week  PT DURATION: 8 weeks  PLANNED INTERVENTIONS: 97164- PT Re-evaluation, 97110-Therapeutic exercises, 97530- Therapeutic activity, [id number]- Neuromuscular re-education, 97535- Self Care, [id number]-  Manual therapy, [id number]- Gait training, [id number]- Aquatic Therapy, [id number]- Electrical stimulation (unattended), [id number]- Electrical stimulation (manual), 97016- Vasopneumatic device, Cryotherapy, and Moist heat.  PLAN FOR NEXT SESSION: assess HEP response, strengthening in gym and aquatic environments, standing activity tolerances  Corean Pouch PTA  10/25/23 2:46 PM

## 2023-10-29 ENCOUNTER — Other Ambulatory Visit: Payer: Self-pay | Admitting: Family Medicine

## 2023-10-30 ENCOUNTER — Ambulatory Visit: Attending: Family Medicine

## 2023-10-30 DIAGNOSIS — R2689 Other abnormalities of gait and mobility: Secondary | ICD-10-CM | POA: Insufficient documentation

## 2023-10-30 DIAGNOSIS — M79604 Pain in right leg: Secondary | ICD-10-CM | POA: Insufficient documentation

## 2023-10-30 DIAGNOSIS — M6281 Muscle weakness (generalized): Secondary | ICD-10-CM | POA: Insufficient documentation

## 2023-10-30 DIAGNOSIS — M79605 Pain in left leg: Secondary | ICD-10-CM | POA: Diagnosis not present

## 2023-10-30 NOTE — Therapy (Signed)
 OUTPATIENT PHYSICAL THERAPY TREATMENT   Patient Name: Andrea [name] MRN: [id number] DOB:Oct 06, 1955, 68 y.o., female Today's Date: 10/30/2023  END OF SESSION:  PT End of Session - 10/30/23 1052     Visit Number 17    Number of Visits 24    Date for PT Re-Evaluation 11/06/23    Authorization Type [employer] Dual Complete    PT Start Time 1100    PT Stop Time 1140    PT Time Calculation (min) 40 min    Activity Tolerance Patient tolerated treatment well;Patient limited by pain    Behavior During Therapy [facility] for tasks assessed/performed           Past Medical History:  Diagnosis Date   Abnormal mammogram 08/28/2014   08/28/14 - Probable benign microcalcifications over the outer mid to lower left breast. Bi-Rads 3. F/U 6 months    Allergic rhinitis 06/22/2014   Arm pain, lateral, left 04/28/2016   Asthma    Breast cancer (HCC) 03/16/2022   Carpal tunnel syndrome 04/26/2006   Qualifier: Diagnosis of  By: Benjamine MD, Aaron     Femur fracture [facility]) 05/14/2021   Femur fracture, right (HCC) 05/14/2021   First degree AV block 09/22/2013   EKG 09/22/13    GANGLION CYST, WRIST, RIGHT 10/06/2008   Qualifier: Diagnosis of  By: Lucille  MD, Taineisha     Hypothyroidism    Imbalance 01/12/2017   Palpitations 09/22/2013   Right wrist pain 04/28/2016   Past Surgical History:  Procedure Laterality Date   ABDOMINAL HYSTERECTOMY     fibroids   BREAST BIOPSY Left 01/24/2022   MM LT BREAST BX W LOC DEV 1ST LESION IMAGE BX SPEC STEREO GUIDE 01/24/2022 [facility]   BREAST BIOPSY  03/15/2022   MM LT RADIOACTIVE SEED LOC MAMMO GUIDE 03/15/2022 [facility]   BREAST LUMPECTOMY WITH RADIOACTIVE SEED LOCALIZATION Left 03/16/2022   Procedure: LEFT BREAST LUMPECTOMY WITH RADIOACTIVE SEED LOCALIZATION;  Surgeon: Vanderbilt Ned, MD;  Location: [facility];  Service: General;  Laterality: Left;   COLONOSCOPY N/A 10/04/2023   Procedure: COLONOSCOPY;  Surgeon: Stacia Glendia BRAVO, MD;  Location: [facility];  Service: Gastroenterology;  Laterality: N/A;   FEMUR IM NAIL Right 05/14/2021   Procedure: INTRAMEDULLARY (IM) RETROGRADE FEMORAL NAILING;  Surgeon: Beuford Anes, MD;  Location: [facility];  Service: Orthopedics;  Laterality: Right;   Patient Active Problem List   Diagnosis Date Noted   Family history of colon cancer 10/04/2023   Benign neoplasm of colon 10/04/2023   Lumbar radiculopathy 06/29/2023   CKD stage 3a, GFR 45-59 ml/min (HCC) 06/29/2023   Elevated liver enzymes 05/04/2023   Hip pain 04/17/2023   Insomnia 02/06/2023   Colon cancer screening 06/19/2022   Skin desquamation 06/14/2022   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   Normocytic anemia 07/12/2021   History of femur fracture 07/11/2021   Seasonal allergies 04/08/2021   Restrictive lung disease 03/25/2021   Prediabetes 03/25/2021   Thoracic aortic ectasia (HCC) 09/07/2020   Congestive heart failure (HCC) 08/03/2020   Esophageal reflux 08/14/2017   Vitamin D  deficiency 01/12/2017   Cataract 09/19/2013   Hypothyroidism 04/26/2006   Hyperlipidemia 04/26/2006   BMI 45.0-49.9, adult (HCC) 04/26/2006   Major depressive disorder, recurrent episode (HCC) 04/26/2006   HYPERTENSION, BENIGN SYSTEMIC 04/26/2006    PCP: Anders Otto DASEN, MD  REFERRING PROVIDER: Anders Otto DASEN, MD  REFERRING DIAG:  M54.16 (ICD-10-CM) - Lumbar radiculopathy M25.551 (ICD-10-CM) - Right hip pain  Rationale for Evaluation and Treatment: Rehabilitation  THERAPY DIAG:  Pain in right leg  Pain in left leg  Muscle weakness (generalized)  Other abnormalities of gait and mobility  ONSET DATE: Chronic  SUBJECTIVE:                                                                                                                                                                                           SUBJECTIVE STATEMENT: Pt presents to PT with some L LE soreness, she fell getting out of her  house onto her L side, no hit to the head. Felt her L ankle gave way when stepping down the threshold out of her house.   Patient presents to PT reporting 10/10 RLE pain that radiates from her lower back to the foot. She is pushed back into pool area in wheelchair and is tearful. She states that the pain began this morning when she got up and has not gotten any better with OTC pain medication. She states this is very odd for her because she was not having any pain during or after her last PT session on land this week.   PERTINENT HISTORY:  L femur fx, HTN, CHF, breast cancer  PAIN:  Are you having pain?  Yes: NPRS scale: 1-2/10 Worst: 10/10 Pain location: bilateral LE (posterior/lateral hips) Pain description: sharp, numb Aggravating factors: walking, standing Relieving factors: medication   PRECAUTIONS: None  RED FLAGS: None   WEIGHT BEARING RESTRICTIONS: No  FALLS:  Has patient fallen in last 6 months? No  LIVING ENVIRONMENT: Lives with: lives with their family Lives in: House/apartment Stairs: Yes: External: 4 steps; on right going up Has following equipment at home: None  OCCUPATION: Retired Designer, industrial/product  PLOF: Independent  PATIENT GOALS: improve standing tolerance, decrease pain  NEXT MD VISIT: 08/14/2023  OBJECTIVE:  Note: Objective measures were completed at Evaluation unless otherwise noted.  DIAGNOSTIC FINDINGS:  See imaging for recent lumbar MRI  PATIENT SURVEYS:   LEFS: 33/80 08/23/2023: 44/80  ODI: 29/50 - 58% disability  COGNITION: Overall cognitive status: Within functional limits for tasks assessed     SENSATION: Light touch: Impaired - anterior shin R  MUSCLE LENGTH: Twiggs test: Right (+); Left (+)  POSTURE: rounded shoulders, forward head, increased lumbar lordosis, and large body habitus   PALPATION: TTP to bilateral posterior/lateral hip  LOWER EXTREMITY ROM:     Active  Right eval Left eval  Hip flexion    Hip extension    Hip  abduction    Hip adduction    Hip internal rotation    Hip external rotation    Knee  flexion [facility]  Knee extension    Ankle dorsiflexion    Ankle plantarflexion    Ankle inversion    Ankle eversion     (Blank rows = not tested)  LOWER EXTREMITY MMT:    MMT Right eval Left eval Right 09/25/23 Left 09/25/23  Hip flexion 4 3+    Hip extension      Hip abduction 3+ 3+ 4 4  Hip adduction      Hip internal rotation      Hip external rotation      Knee flexion 4 4    Knee extension 4 4    Ankle dorsiflexion      Ankle plantarflexion      Ankle inversion      Ankle eversion       (Blank rows = not tested)  LUMBAR SPECIAL TESTS:  Straight leg raise test: Negative and Slump test: Negative  FUNCTIONAL TESTS:  30 Second Sit to Stand: 4 reps with UE 08/23/2023: 6 reps no UE 09/25/2023: 5 reps no UE  GAIT: Distance walked: 41ft Assistive device utilized: None Level of assistance: SBA Comments: flexed trunk, decreased gait speed  TREATMENT: [facility] Adult PT Treatment:                                   DATE: 10/30/2023 Nu-step L5 x 5 min UE/LE for functional activity tolerance LAQ 2x10 5lb STS 3x5 - high table LTR x 10 Bridge 2x10 Supine SLR x 15 ea Hooklying clamshell 2x15 black band  [facility] Adult PT Treatment:                                                DATE: 10/25/23 Aquatic therapy at [facility]- [address] - therapeutic pool temp approximately 91 degrees. Pt enters building ambulating independently. Treatment took place in water 3.8 to  4 ft 8 in. deep depending upon activity.  Pt entered and exited the pool via stair and handrails independently but with step to pattern and increased guarding and decreased pace.  Aquatic Exercise: Walking forward/side stepping holding yellow noodle x 2 laps each Step ups to bottom step with single rail use x5 BIL Standing with UE support edge of pool: Hip abd/add x10 BIL Hip ext x10 BIL Hamstring curl x10 BIL Squats  2x10 Heel/toe raises 2x10 Hip ext/flex with knee straight x 10 BIL Hip Circles CW/CCW x10 each BIL  Pt requires the buoyancy of water for active assisted exercises with buoyancy supported for strengthening and AROM exercises. Hydrostatic pressure also supports joints by unweighting joint load by at least 50 % in 3-4 feet depth water. 80% in chest to neck deep water. Water will provide assistance with movement using the current and laminar flow while the buoyancy reduces weight bearing. Pt requires the viscosity of the water for resistance with strengthening exercises.  [facility] Adult PT Treatment:                                   DATE: 10/23/23 Nu-step L5 x 4 min UE/LE for functional activity tolerance LAQ 2x15 5lb STS - unable today without pain Supine SLR 2x10 2# ea Bridge with ball 2x10 LTR x 10 Hooklying clamshell 2x15  black band Hooklying march 2x20 black band  [facility] Adult PT Treatment:                                   DATE: 10/16/23 Therapeutic Exercise Nu-step L4 x 4 min UE/LE for functional activity tolerance Supine SLR x 10 ea Bridge x10 LTR x 10 TKE 5 # x 15 ea Repeated flexion rolls in sitting with swiss ball x 10 Lateral rolls x 10 ea with swiss ball Standing abduction 2x10 Standing hamstring curls 2x10 Therapeutic Activities Minisquats at counter x 10 cues to bring hips back  Lateral walking at counter 2 laps w/o UE support  STS 3 x 10  Step ups 6 UE support x10 ea Lateral step up 4 with UE support x5  PATIENT EDUCATION:  Education details: HEP updates and POC extension Person educated: Patient Education method: Explanation, Demonstration, and Handouts Education comprehension: verbalized understanding and returned demonstration  HOME EXERCISE PROGRAM: Access Code: [email or web address] URL: [email or web address] Date: 08/23/2023 Prepared by: Alm Kingdom  Exercises - Supine Quadricep Sets  - 1 x daily - 7 x weekly - 2 sets - 10 reps - 5 sec hold - Active  Straight Leg Raise with Quad Set  - 1 x daily - 7 x weekly - 2 sets - 10 reps - Hooklying Clamshell with Resistance  - 1 x daily - 7 x weekly - 2-3 sets - 15 reps - green band hold - Beginner Bridge  - 1 x daily - 7 x weekly - 2 sets - 10 reps - Sit to Stand with Hands on Knees  - 1 x daily - 7 x weekly - 2 sets - 10 reps - Standing Hip Abduction with Counter Support  - 1 x daily - 7 x weekly - 2 sets - 10 reps - Standing Hip Extension with Counter Support  - 1 x daily - 7 x weekly - 2 sets - 10 reps  ASSESSMENT:  CLINICAL IMPRESSION: Pt was able to complete prescribed exercises, continued to be limited from recent progression due to L ankle pain. No new deficits from fall earlier in the day when she was at home. We continue to focus on core/hip strengthening and general functional mobility. Will assess how she does in pool this week, may need continued strengthening in order to keep from regressing.   Evaluation: Patient is a 68 y.o. F who was seen today for physical therapy evaluation and treatment for chronic LE pain with occasional LBP. Physical findings are consistent with MD impression as pt demonstrates decrease in core and LE strength and functional mobility. LEFS score shows severe disability in performance of home ADLs and community activities. Pt would benefit from skilled PT services working on improving strength, activity tolerance, and mobility while decreasing pain.    OBJECTIVE IMPAIRMENTS: Abnormal gait, decreased activity tolerance, decreased endurance, decreased mobility, difficulty walking, decreased ROM, decreased strength, and pain  ACTIVITY LIMITATIONS: carrying, lifting, standing, squatting, stairs, transfers, and locomotion level  PARTICIPATION LIMITATIONS: meal prep, cleaning, driving, shopping, community activity, occupation, and yard work  PERSONAL FACTORS: Time since onset of injury/illness/exacerbation and 3+ comorbidities: L femur fx, HTN, CHF, breast cancer are  also affecting patient's functional outcome.   REHAB POTENTIAL: Good  CLINICAL DECISION MAKING: Evolving/moderate complexity  EVALUATION COMPLEXITY: Moderate   GOALS: Goals reviewed with patient? No  SHORT TERM GOALS: Target date: 07/31/2023  Pt will be compliant and knowledgeable with initial  HEP for improved comfort and carryover Baseline: initial HEP given Goal status: MET 07/31/23  2.  Pt will self report LE pain no greater than 7/10 for improved comfort and functional ability Baseline: 10/10 at worst Goal status: Partially met 07/31/23   LONG TERM GOALS: Target date: 11/06/2023    Pt will improve LEFS to no less than 40/80 as proxy for functional improvement with home ADLs and higher level community activity with home ADLs and community activities Baseline: 33/80 08/23/2023: 44/80 Goal status: MET   2.  Pt will self report LE pain no greater than 3/10 for improved comfort and functional ability Baseline: 10/10 at worst 09/25/23: 5/10 at worst Goal status: IN PROGRESS   3.  Pt will increase 30 Second Sit to Stand rep count to no less than 8 reps for improved balance, strength, and functional mobility Baseline: 4 reps with UE 08/23/2023: 6 reps no UE 09/25/2023: 5 reps no UE Goal status: IN PROGRESS   4.  Pt will improve standing activity tolerance to no less than 20 minutes for improved comfort and functional ability with shopping and church activities Baseline: 4-5 minutes 08/23/2023: 15-20 minutes 09/25/2023: 15 minutes Goal status: IN PROGRESS  5.  Pt will improve all LE MMT to no less than 4/5 for improved functional mobility and decrease pain Baseline: see chart Goal status: MET   6. Pt will be decrease ODI disability score to no greater than 48% (24/80) as proxy for functional improvement Baseline: 29/50 - 58% disability Goal status: INITIAL  PLAN:  PT FREQUENCY: 2x/week  PT DURATION: 8 weeks  PLANNED INTERVENTIONS: 97164- PT Re-evaluation, 97110-Therapeutic  exercises, 97530- Therapeutic activity, [id number]- Neuromuscular re-education, 97535- Self Care, [id number]- Manual therapy, [id number]- Gait training, [id number]- Aquatic Therapy, [id number]- Electrical stimulation (unattended), [id number]- Electrical stimulation (manual), 97016- Vasopneumatic device, Cryotherapy, and Moist heat.  PLAN FOR NEXT SESSION: assess HEP response, strengthening in gym and aquatic environments, standing activity tolerances  Alm JAYSON Kingdom PT  10/30/23 11:47 AM

## 2023-11-01 ENCOUNTER — Ambulatory Visit

## 2023-11-01 DIAGNOSIS — M6281 Muscle weakness (generalized): Secondary | ICD-10-CM | POA: Diagnosis not present

## 2023-11-01 DIAGNOSIS — R2689 Other abnormalities of gait and mobility: Secondary | ICD-10-CM | POA: Diagnosis not present

## 2023-11-01 DIAGNOSIS — M79604 Pain in right leg: Secondary | ICD-10-CM | POA: Diagnosis not present

## 2023-11-01 DIAGNOSIS — M79605 Pain in left leg: Secondary | ICD-10-CM | POA: Diagnosis not present

## 2023-11-01 NOTE — Therapy (Signed)
 OUTPATIENT PHYSICAL THERAPY TREATMENT   Patient Name: Andrea [name] MRN: [id number] DOB:November 03, 1955, 68 y.o., female Today's Date: 11/01/2023  END OF SESSION:  PT End of Session - 11/01/23 1359     Visit Number 18    Number of Visits 24    Date for PT Re-Evaluation 11/06/23    Authorization Type U[employer] Dual Complete    PT Start Time 1400    PT Stop Time 1440    PT Time Calculation (min) 40 min    Activity Tolerance Patient tolerated treatment well;Patient limited by pain    Behavior During Therapy [facility] for tasks assessed/performed            Past Medical History:  Diagnosis Date   Abnormal mammogram 08/28/2014   08/28/14 - Probable benign microcalcifications over the outer mid to lower left breast. Bi-Rads 3. F/U 6 months    Allergic rhinitis 06/22/2014   Arm pain, lateral, left 04/28/2016   Asthma    Breast cancer (HCC) 03/16/2022   Carpal tunnel syndrome 04/26/2006   Qualifier: Diagnosis of  By: Benjamine MD, Aaron     Femur fracture [facility]) 05/14/2021   Femur fracture, right (HCC) 05/14/2021   First degree AV block 09/22/2013   EKG 09/22/13    GANGLION CYST, WRIST, RIGHT 10/06/2008   Qualifier: Diagnosis of  By: Lucille  MD, Taineisha     Hypothyroidism    Imbalance 01/12/2017   Palpitations 09/22/2013   Right wrist pain 04/28/2016   Past Surgical History:  Procedure Laterality Date   ABDOMINAL HYSTERECTOMY     fibroids   BREAST BIOPSY Left 01/24/2022   MM LT BREAST BX W LOC DEV 1ST LESION IMAGE BX SPEC STEREO GUIDE 01/24/2022 [facility]   BREAST BIOPSY  03/15/2022   MM LT RADIOACTIVE SEED LOC MAMMO GUIDE 03/15/2022 [facility]   BREAST LUMPECTOMY WITH RADIOACTIVE SEED LOCALIZATION Left 03/16/2022   Procedure: LEFT BREAST LUMPECTOMY WITH RADIOACTIVE SEED LOCALIZATION;  Surgeon: Vanderbilt Ned, MD;  Location: [facility];  Service: General;  Laterality: Left;   COLONOSCOPY N/A 10/04/2023   Procedure: COLONOSCOPY;  Surgeon: Stacia Glendia BRAVO, MD;  Location: [facility];  Service: Gastroenterology;  Laterality: N/A;   FEMUR IM NAIL Right 05/14/2021   Procedure: INTRAMEDULLARY (IM) RETROGRADE FEMORAL NAILING;  Surgeon: Beuford Anes, MD;  Location: [facility];  Service: Orthopedics;  Laterality: Right;   Patient Active Problem List   Diagnosis Date Noted   Family history of colon cancer 10/04/2023   Benign neoplasm of colon 10/04/2023   Lumbar radiculopathy 06/29/2023   CKD stage 3a, GFR 45-59 ml/min (HCC) 06/29/2023   Elevated liver enzymes 05/04/2023   Hip pain 04/17/2023   Insomnia 02/06/2023   Colon cancer screening 06/19/2022   Skin desquamation 06/14/2022   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   Normocytic anemia 07/12/2021   History of femur fracture 07/11/2021   Seasonal allergies 04/08/2021   Restrictive lung disease 03/25/2021   Prediabetes 03/25/2021   Thoracic aortic ectasia (HCC) 09/07/2020   Congestive heart failure (HCC) 08/03/2020   Esophageal reflux 08/14/2017   Vitamin D  deficiency 01/12/2017   Cataract 09/19/2013   Hypothyroidism 04/26/2006   Hyperlipidemia 04/26/2006   BMI 45.0-49.9, adult (HCC) 04/26/2006   Major depressive disorder, recurrent episode (HCC) 04/26/2006   HYPERTENSION, BENIGN SYSTEMIC 04/26/2006    PCP: Anders Otto DASEN, MD  REFERRING PROVIDER: Anders Otto DASEN, MD  REFERRING DIAG:  M54.16 (ICD-10-CM) - Lumbar radiculopathy M25.551 (ICD-10-CM) - Right hip pain  Rationale for Evaluation and Treatment: Rehabilitation  THERAPY DIAG:  Pain in right leg  Pain in left leg  Muscle weakness (generalized)  Other abnormalities of gait and mobility  ONSET DATE: Chronic  SUBJECTIVE:                                                                                                                                                                                           SUBJECTIVE STATEMENT: Patient reports that the LLE is very sore and painful today, says  she felt ok after previous session on land.  Patient presents to PT reporting 10/10 RLE pain that radiates from her lower back to the foot. She is pushed back into pool area in wheelchair and is tearful. She states that the pain began this morning when she got up and has not gotten any better with OTC pain medication. She states this is very odd for her because she was not having any pain during or after her last PT session on land this week.   PERTINENT HISTORY:  L femur fx, HTN, CHF, breast cancer  PAIN:  Are you having pain?  Yes: NPRS scale: 1-2/10 Worst: 10/10 Pain location: bilateral LE (posterior/lateral hips) Pain description: sharp, numb Aggravating factors: walking, standing Relieving factors: medication   PRECAUTIONS: None  RED FLAGS: None   WEIGHT BEARING RESTRICTIONS: No  FALLS:  Has patient fallen in last 6 months? No  LIVING ENVIRONMENT: Lives with: lives with their family Lives in: House/apartment Stairs: Yes: External: 4 steps; on right going up Has following equipment at home: None  OCCUPATION: Retired Designer, industrial/product  PLOF: Independent  PATIENT GOALS: improve standing tolerance, decrease pain  NEXT MD VISIT: 08/14/2023  OBJECTIVE:  Note: Objective measures were completed at Evaluation unless otherwise noted.  DIAGNOSTIC FINDINGS:  See imaging for recent lumbar MRI  PATIENT SURVEYS:   LEFS: 33/80 08/23/2023: 44/80  ODI: 29/50 - 58% disability  COGNITION: Overall cognitive status: Within functional limits for tasks assessed     SENSATION: Light touch: Impaired - anterior shin R  MUSCLE LENGTH: Tango test: Right (+); Left (+)  POSTURE: rounded shoulders, forward head, increased lumbar lordosis, and large body habitus   PALPATION: TTP to bilateral posterior/lateral hip  LOWER EXTREMITY ROM:     Active  Right eval Left eval  Hip flexion    Hip extension    Hip abduction    Hip adduction    Hip internal rotation    Hip external  rotation    Knee flexion [facility]  Knee extension    Ankle dorsiflexion    Ankle plantarflexion    Ankle inversion  Ankle eversion     (Blank rows = not tested)  LOWER EXTREMITY MMT:    MMT Right eval Left eval Right 09/25/23 Left 09/25/23  Hip flexion 4 3+    Hip extension      Hip abduction 3+ 3+ 4 4  Hip adduction      Hip internal rotation      Hip external rotation      Knee flexion 4 4    Knee extension 4 4    Ankle dorsiflexion      Ankle plantarflexion      Ankle inversion      Ankle eversion       (Blank rows = not tested)  LUMBAR SPECIAL TESTS:  Straight leg raise test: Negative and Slump test: Negative  FUNCTIONAL TESTS:  30 Second Sit to Stand: 4 reps with UE 08/23/2023: 6 reps no UE 09/25/2023: 5 reps no UE  GAIT: Distance walked: 43ft Assistive device utilized: None Level of assistance: SBA Comments: flexed trunk, decreased gait speed  TREATMENT: [facility] Adult PT Treatment:                                                DATE: 11/01/23 Aquatic therapy at [facility] [address] - therapeutic pool temp approximately 91 degrees. Pt enters building ambulating independently. Treatment took place in water 3.8 to 4 ft 8 in. deep depending upon activity.  Pt entered and exited the pool via stair and handrails independently but with step to pattern and increased guarding and decreased pace.  Aquatic Exercise: Walking forward/side stepping holding yellow noodle x 2 laps each Standing with UE support edge of pool: Hip abd/add x10 BIL Hip ext x10 BIL Squats 2x10 Heel/toe raises x10 Sitting on submerged bench: Alternating LAQ Bicycle kicks Scissor kicks  Pt requires the buoyancy of water for active assisted exercises with buoyancy supported for strengthening and AROM exercises. Hydrostatic pressure also supports joints by unweighting joint load by at least 50 % in 3-4 feet depth water. 80% in chest to neck deep water. Water will provide assistance with  movement using the current and laminar flow while the buoyancy reduces weight bearing. Pt requires the viscosity of the water for resistance with strengthening exercises.  [facility] Adult PT Treatment:                                   DATE: 10/30/2023 Nu-step L5 x 5 min UE/LE for functional activity tolerance LAQ 2x10 5lb STS 3x5 - high table LTR x 10 Bridge 2x10 Supine SLR x 15 ea Hooklying clamshell 2x15 black band  [facility] Adult PT Treatment:                                                DATE: 10/25/23 Aquatic therapy at [facility] [address] - therapeutic pool temp approximately 91 degrees. Pt enters building ambulating independently. Treatment took place in water 3.8 to 4 ft 8 in. deep depending upon activity.  Pt entered and exited the pool via stair and handrails independently but with step to pattern and increased guarding and decreased pace.  Aquatic Exercise: Walking forward/side stepping holding yellow  noodle x 2 laps each Step ups to bottom step with single rail use x5 BIL Standing with UE support edge of pool: Hip abd/add x10 BIL Hip ext x10 BIL Hamstring curl x10 BIL Squats 2x10 Heel/toe raises 2x10 Hip ext/flex with knee straight x 10 BIL Hip Circles CW/CCW x10 each BIL  Pt requires the buoyancy of water for active assisted exercises with buoyancy supported for strengthening and AROM exercises. Hydrostatic pressure also supports joints by unweighting joint load by at least 50 % in 3-4 feet depth water. 80% in chest to neck deep water. Water will provide assistance with movement using the current and laminar flow while the buoyancy reduces weight bearing. Pt requires the viscosity of the water for resistance with strengthening exercises.   PATIENT EDUCATION:  Education details: HEP updates and POC extension Person educated: Patient Education method: Explanation, Demonstration, and Handouts Education comprehension: verbalized understanding and returned  demonstration  HOME EXERCISE PROGRAM: Access Code: [email or web address] URL: [email or web address] Date: 08/23/2023 Prepared by: Alm Kingdom  Exercises - Supine Quadricep Sets  - 1 x daily - 7 x weekly - 2 sets - 10 reps - 5 sec hold - Active Straight Leg Raise with Quad Set  - 1 x daily - 7 x weekly - 2 sets - 10 reps - Hooklying Clamshell with Resistance  - 1 x daily - 7 x weekly - 2-3 sets - 15 reps - green band hold - Beginner Bridge  - 1 x daily - 7 x weekly - 2 sets - 10 reps - Sit to Stand with Hands on Knees  - 1 x daily - 7 x weekly - 2 sets - 10 reps - Standing Hip Abduction with Counter Support  - 1 x daily - 7 x weekly - 2 sets - 10 reps - Standing Hip Extension with Counter Support  - 1 x daily - 7 x weekly - 2 sets - 10 reps  ASSESSMENT:  CLINICAL IMPRESSION: Patient presents to aquatic PT session reporting increased pain in her LLE, says she felt ok after previous land session this week. Session today focused on gentle movements and LE strengthening in the aquatic environment for use of buoyancy to offload joints and the viscosity of water as resistance during therapeutic exercise. Patient was able to tolerate all prescribed exercises in the aquatic environment with no adverse effects. Patient continues to benefit from skilled PT services on land and aquatic based and should be progressed as able to improve functional independence.   Evaluation: Patient is a 68 y.o. F who was seen today for physical therapy evaluation and treatment for chronic LE pain with occasional LBP. Physical findings are consistent with MD impression as pt demonstrates decrease in core and LE strength and functional mobility. LEFS score shows severe disability in performance of home ADLs and community activities. Pt would benefit from skilled PT services working on improving strength, activity tolerance, and mobility while decreasing pain.    OBJECTIVE IMPAIRMENTS: Abnormal gait, decreased activity  tolerance, decreased endurance, decreased mobility, difficulty walking, decreased ROM, decreased strength, and pain  ACTIVITY LIMITATIONS: carrying, lifting, standing, squatting, stairs, transfers, and locomotion level  PARTICIPATION LIMITATIONS: meal prep, cleaning, driving, shopping, community activity, occupation, and yard work  PERSONAL FACTORS: Time since onset of injury/illness/exacerbation and 3+ comorbidities: L femur fx, HTN, CHF, breast cancer are also affecting patient's functional outcome.   REHAB POTENTIAL: Good  CLINICAL DECISION MAKING: Evolving/moderate complexity  EVALUATION COMPLEXITY: Moderate   GOALS: Goals reviewed with patient? No  SHORT  TERM GOALS: Target date: 07/31/2023  Pt will be compliant and knowledgeable with initial HEP for improved comfort and carryover Baseline: initial HEP given Goal status: MET 07/31/23  2.  Pt will self report LE pain no greater than 7/10 for improved comfort and functional ability Baseline: 10/10 at worst Goal status: Partially met 07/31/23   LONG TERM GOALS: Target date: 11/06/2023    Pt will improve LEFS to no less than 40/80 as proxy for functional improvement with home ADLs and higher level community activity with home ADLs and community activities Baseline: 33/80 08/23/2023: 44/80 Goal status: MET   2.  Pt will self report LE pain no greater than 3/10 for improved comfort and functional ability Baseline: 10/10 at worst 09/25/23: 5/10 at worst Goal status: IN PROGRESS   3.  Pt will increase 30 Second Sit to Stand rep count to no less than 8 reps for improved balance, strength, and functional mobility Baseline: 4 reps with UE 08/23/2023: 6 reps no UE 09/25/2023: 5 reps no UE Goal status: IN PROGRESS   4.  Pt will improve standing activity tolerance to no less than 20 minutes for improved comfort and functional ability with shopping and church activities Baseline: 4-5 minutes 08/23/2023: 15-20 minutes 09/25/2023: 15  minutes Goal status: IN PROGRESS  5.  Pt will improve all LE MMT to no less than 4/5 for improved functional mobility and decrease pain Baseline: see chart Goal status: MET   6. Pt will be decrease ODI disability score to no greater than 48% (24/80) as proxy for functional improvement Baseline: 29/50 - 58% disability Goal status: INITIAL  PLAN:  PT FREQUENCY: 2x/week  PT DURATION: 8 weeks  PLANNED INTERVENTIONS: 97164- PT Re-evaluation, 97110-Therapeutic exercises, 97530- Therapeutic activity, [id number]- Neuromuscular re-education, 97535- Self Care, [id number]- Manual therapy, [id number]- Gait training, [id number]- Aquatic Therapy, H9716- Electrical stimulation (unattended), [id number]- Electrical stimulation (manual), 97016- Vasopneumatic device, Cryotherapy, and Moist heat.  PLAN FOR NEXT SESSION: assess HEP response, strengthening in gym and aquatic environments, standing activity tolerances  Corean Pouch PTA  11/01/23 2:47 PM

## 2023-11-05 ENCOUNTER — Encounter: Payer: Self-pay | Admitting: Family Medicine

## 2023-11-05 ENCOUNTER — Ambulatory Visit (INDEPENDENT_AMBULATORY_CARE_PROVIDER_SITE_OTHER): Admitting: Family Medicine

## 2023-11-05 VITALS — BP 105/86 | HR 79 | Ht 63.0 in

## 2023-11-05 DIAGNOSIS — Z23 Encounter for immunization: Secondary | ICD-10-CM

## 2023-11-05 DIAGNOSIS — R7309 Other abnormal glucose: Secondary | ICD-10-CM | POA: Diagnosis not present

## 2023-11-05 DIAGNOSIS — R7303 Prediabetes: Secondary | ICD-10-CM | POA: Diagnosis not present

## 2023-11-05 DIAGNOSIS — R748 Abnormal levels of other serum enzymes: Secondary | ICD-10-CM

## 2023-11-05 LAB — POCT GLYCOSYLATED HEMOGLOBIN (HGB A1C): Hemoglobin A1C: 5.6 % (ref 4.0–5.6)

## 2023-11-05 NOTE — Patient Instructions (Signed)
Hip Exercises Ask your health care provider which exercises are safe for you. Do exercises exactly as told by your provider and adjust them as told. It is normal to feel mild stretching, pulling, tightness, or discomfort as you do these exercises. Stop right away if you feel sudden pain or your pain gets worse. Do not begin these exercises until told by your provider. Stretching and range-of-motion exercises These exercises warm up your muscles and joints and improve the movement and flexibility of your hip. They also help to relieve pain, numbness, and tingling. You may be asked to limit your range of motion if you had a hip replacement. Talk to your provider about these limits. Hamstrings, supine  Lie on your back (supine position). Loop a belt, towel, or exercise band over the ball of your left / right foot. The ball of your foot is on the walking surface, right under your toes. Straighten your left / right knee and slowly pull on the belt, towel, or band to raise your leg until you feel a gentle stretch behind your knee (hamstring). Do not let your knee bend while you do this. Keep your other leg flat on the floor. Hold this position for __________ seconds. Slowly return your leg to the starting position. Repeat __________ times. Complete this exercise __________ times a day. Hip rotation  Lie on your back on a firm surface. With your left / right hand, gently pull your left / right knee toward the shoulder that is on the same side of the body. Stop when your knee is pointing toward the ceiling. Hold your left / right ankle with your other hand. Keeping your knee steady, gently pull your left / right ankle toward your other shoulder until you feel a stretch in your butt. Keep your hips and shoulders firmly planted while you do this stretch. Hold this position for __________ seconds. Repeat __________ times. Complete this exercise __________ times a day. Seated stretch This exercise is  sometimes called hamstrings and adductors stretch. Sit on the floor with your legs stretched wide. Keep your knees straight during this exercise. Keeping your head and back in a straight line, bend at your waist to reach for your left foot (position A). You should feel a stretch in your right inner thigh (adductors). Hold this position for __________ seconds. Then slowly return to the upright position. Keeping your head and back in a straight line, bend at your waist to reach forward (position B). You should feel a stretch behind both of your thighs and knees (hamstrings). Hold this position for __________ seconds. Then slowly return to the upright position. Keeping your head and back in a straight line, bend at your waist to reach for your right foot (position C). You should feel a stretch in your left inner thigh (adductors). Hold this position for __________ seconds. Then slowly return to the upright position. Repeat __________ times. Complete this exercise __________ times a day. Lunge This exercise stretches the muscles of the hip (hip flexors). Place your left / right knee on the floor and bend your other knee so that is directly over your ankle. You should be half-kneeling. Keep good posture with your head over your shoulders. Tighten your butt muscles to point your tailbone downward. This will prevent your back from arching too much. You should feel a gentle stretch in the front of your left / right thigh and hip. If you do not feel a stretch, slide your other foot forward slightly and  then slowly lunge forward with your chest up until your knee once again lines up over your ankle. Make sure your tailbone continues to point downward. Hold this position for __________ seconds. Slowly return to the starting position. Repeat __________ times. Complete this exercise __________ times a day. Strengthening exercises These exercises build strength and endurance in your hip. Endurance is the  ability to use your muscles for a long time, even after they get tired. Bridge This exercise strengthens the muscles of your hip (hip extensors). Lie on your back on a firm surface with your knees bent and your feet flat on the floor. Tighten your butt muscles and lift your bottom off the floor until the trunk of your body and your hips are level with your thighs. Do not arch your back. You should feel the muscles working in your butt and the back of your thighs. If you do not feel these muscles, slide your feet 1-2 inches (2.5-5 cm) farther away from your butt. Hold this position for __________ seconds. Slowly lower your hips to the starting position. Let your muscles relax completely between repetitions. Repeat __________ times. Complete this exercise __________ times a day. Straight leg raises, side-lying This exercise strengthens the muscles that move the hip joint away from the center of the body (hip abductors). Lie on your side with your left / right leg in the top position. Lie so your head, shoulder, hip, and knee line up. You may bend your bottom knee slightly to help you balance. Roll your hips slightly forward, so your hips are stacked directly over each other and your left / right knee is facing forward. Leading with your heel, lift your top leg 4-6 inches (10-15 cm). You should feel the muscles in your top hip lifting. Do not let your foot drift forward. Do not let your knee roll toward the ceiling. Hold this position for __________ seconds. Slowly return to the starting position. Let your muscles relax completely between repetitions. Repeat __________ times. Complete this exercise __________ times a day. Straight leg raises, side-lying This exercise strengthens the muscles that move the hip joint toward the center of the body (hip adductors). Lie on your side with your left / right leg in the bottom position. Lie so your head, shoulder, hip, and knee line up. You may place your  upper foot in front to help you balance. Roll your hips slightly forward, so your hips are stacked directly over each other and your left / right knee is facing forward. Tense the muscles in your inner thigh and lift your bottom leg 4-6 inches (10-15 cm). Hold this position for __________ seconds. Slowly return to the starting position. Let your muscles relax completely between repetitions. Repeat __________ times. Complete this exercise __________ times a day. Straight leg raises, supine This exercise strengthens the muscles in the front of your thigh (quadriceps and hip flexors). Lie on your back (supine position) with your left / right leg extended and your other knee bent. Tense the muscles in the front of your left / right thigh. You should see your kneecap slide up or see increased dimpling just above your knee. Keep these muscles tight as you raise your leg 4-6 inches (10-15 cm) off the floor. Do not let your knee bend. Hold this position for __________ seconds. Keep these muscles tense as you lower your leg. Relax the muscles slowly and completely between repetitions. Repeat __________ times. Complete this exercise __________ times a day. Hip abductors, standing This  exercise strengthens the muscles that move the leg and hip joint away from the center of the body (hip abductors). Tie one end of a rubber exercise band or tubing to a secure surface, such as a chair, table, or pole. Loop the other end of the band or tubing around your left / right ankle. Keeping your ankle with the band or tubing directly opposite the secured end, step away until there is tension in the tubing or band. Hold on to a chair, table, or pole as needed for balance. Lift your left / right leg out to your side. While you do this: Keep your back upright. Keep your shoulders over your hips. Keep your toes pointing forward. Make sure to use your hip muscles to slowly lift your leg. Do not tip your body or  forcefully lift your leg. Hold this position for __________ seconds. Slowly return to the starting position. Repeat __________ times. Complete this exercise __________ times a day. Squats This exercise strengthens the muscles in the front of your thigh (quadriceps). Stand in front of a table, or stand in a doorframe so your feet and knees are in line with the frame. You may place your hands on the table or frame for balance. Slowly bend your knees and lower your hips like you are going to sit in a chair. Keep your lower legs in a straight up-and-down position. Do not let your hips go lower than your knees. Do not bend your knees lower than told by your provider. If your hip pain increases, do not bend as low. Hold this position for ___________ seconds. Slowly push with your legs to return to standing. Do not use your hands to pull yourself to standing. Repeat __________ times. Complete this exercise __________ times a day. This information is not intended to replace advice given to you by your health care provider. Make sure you discuss any questions you have with your health care provider. Document Revised: 10/18/2021 Document Reviewed: 10/18/2021 Elsevier Patient Education  2024 [employer].

## 2023-11-05 NOTE — Progress Notes (Signed)
    SUBJECTIVE:   CHIEF COMPLAINT / HPI:   Discussed the use of AI scribe software for clinical note transcription with the patient, who gave verbal consent to proceed.  History of Present Illness   Andrea [name] is a 68 year old female who presents with left hip pain following a fall and for routine liver and A1c testing.  She has been experiencing intermittent left hip pain with an intensity of 8 out of 10. The left hip pain began after a fall on the left side last Tuesday. The pain began after an incident last Tuesday when she was on her way to therapy, stepped down, and felt her foot was weak, leading to a fall. She is uncertain if she fell on her left side, but notes that the pain has been present on and off since then. She mentions having had similar hip pain in the past. The pain is primarily present when walking, although it has improved since the initial onset. She is currently using Tylenol  for pain management.       PERTINENT  PMH / PSH: PMhx reviewed  OBJECTIVE:   BP 105/86   Pulse 79   Ht 5' 3 (1.6 m)   SpO2 100%   BMI 46.59 kg/m   Physical Exam   VITALS: BP- 105/86 GEN: No distress CHEST: Lungs clear to auscultation, no wheezing. CARDIOVASCULAR: S1 S2 normal, no murmurs. RRR EXT: Normal ROM of both LL with mild tenderness of her left hip joint. No leg edema, erythema or swelling       ASSESSMENT/PLAN:   Assessment & Plan   Assessment and Plan    Left hip pain Chronic left hip pain exacerbated by a recent fall.  - PT referral recommended. However, she prefers water therapy and stated that she would call for her appointment. - Continue acetaminophen  - use sparingly discussed due to her liver. - Provide home exercises. - Encourage water therapy. - Consider referral to physical therapy, change pain medication, and obtain a hip x-ray if no improvement. She agreed with the plan  Hypertension Blood pressure is well-controlled with the current  regimen.  History of abnormal liver function tests Previous tests elevated, ongoing monitoring. - Order liver function tests.   General Health Maintenance Due for liver test and A1c. Discussed flu and COVID vaccinations. - Perform liver test and A1c. - Administer flu vaccinations. - Received COVID shot Feb 2025  - will readdress need for additional dose.      PreDM: A1C checked and it looks great Continue to work on diet and exercise   Otto Fairly, MD [facility]

## 2023-11-05 NOTE — Assessment & Plan Note (Signed)
A1C checked today. 

## 2023-11-06 ENCOUNTER — Ambulatory Visit: Payer: Self-pay | Admitting: Family Medicine

## 2023-11-06 LAB — CMP14+EGFR
ALT: 33 IU/L — ABNORMAL HIGH (ref 0–32)
AST: 79 IU/L — ABNORMAL HIGH (ref 0–40)
Albumin: 3.5 g/dL — ABNORMAL LOW (ref 3.9–4.9)
Alkaline Phosphatase: 139 IU/L — ABNORMAL HIGH (ref 44–121)
BUN/Creatinine Ratio: 12 (ref 12–28)
BUN: 13 mg/dL (ref 8–27)
Bilirubin Total: 0.5 mg/dL (ref 0.0–1.2)
CO2: 26 mmol/L (ref 20–29)
Calcium: 9.3 mg/dL (ref 8.7–10.3)
Chloride: 100 mmol/L (ref 96–106)
Creatinine, Ser: 1.06 mg/dL — ABNORMAL HIGH (ref 0.57–1.00)
Globulin, Total: 3.6 g/dL (ref 1.5–4.5)
Glucose: 102 mg/dL — ABNORMAL HIGH (ref 70–99)
Potassium: 3.2 mmol/L — ABNORMAL LOW (ref 3.5–5.2)
Sodium: 145 mmol/L — ABNORMAL HIGH (ref 134–144)
Total Protein: 7.1 g/dL (ref 6.0–8.5)
eGFR: 58 mL/min/1.73 — ABNORMAL LOW (ref >59)

## 2023-11-06 MED ORDER — POTASSIUM CHLORIDE CRYS ER 10 MEQ PO TBCR
10.0000 meq | EXTENDED_RELEASE_TABLET | Freq: Every day | ORAL | 0 refills | Status: DC
Start: 2023-11-06 — End: 2023-11-27

## 2023-11-06 NOTE — Telephone Encounter (Signed)
 HIPAA compliant callback message left.  Please advise the following information below:  Renal 1.06 at baseline - keep well hydrated. Sodium is elevated: May be due to dehydration. Keep well hydrated. Recheck at next visit.   Potassium is low at 3.2: May be due to poor intake. Kdur escribed to the pharmacy. Recheck at next visit.   Liver enzymes are still elevated, but at baseline. We already took her off Statin and Fish oil  for this. Consider changing Amlodipine  to a different agent for BP management. Liver enzymes show hepatosteatosis. Perhaps repeat the test in 3-6 months.

## 2023-11-06 NOTE — Telephone Encounter (Signed)
 Patient returns call to nurse line.   Discussed results with patient and the need to start potassium. Advised PCP has sent this to her pharmacy.    Discussed hydration and FU 3-6 months.   She was appreciative of time.

## 2023-11-13 ENCOUNTER — Telehealth: Payer: Self-pay | Admitting: Physical Medicine and Rehabilitation

## 2023-11-13 ENCOUNTER — Telehealth: Payer: Self-pay

## 2023-11-13 DIAGNOSIS — M48061 Spinal stenosis, lumbar region without neurogenic claudication: Secondary | ICD-10-CM

## 2023-11-13 DIAGNOSIS — M5416 Radiculopathy, lumbar region: Secondary | ICD-10-CM

## 2023-11-13 NOTE — Telephone Encounter (Signed)
Patient called. She would like an appointment with Dr. Newton.  

## 2023-11-13 NOTE — Telephone Encounter (Signed)
 Patient advised she wants an injection in her lower back.Andrea [name]

## 2023-11-14 ENCOUNTER — Ambulatory Visit: Admitting: Physical Therapy

## 2023-11-27 ENCOUNTER — Telehealth: Payer: Self-pay

## 2023-11-27 ENCOUNTER — Encounter (HOSPITAL_BASED_OUTPATIENT_CLINIC_OR_DEPARTMENT_OTHER): Payer: Self-pay | Admitting: Internal Medicine

## 2023-11-27 ENCOUNTER — Ambulatory Visit (INDEPENDENT_AMBULATORY_CARE_PROVIDER_SITE_OTHER): Admitting: Internal Medicine

## 2023-11-27 ENCOUNTER — Other Ambulatory Visit (HOSPITAL_COMMUNITY): Payer: Self-pay

## 2023-11-27 VITALS — BP 128/86 | HR 78 | Ht 64.0 in | Wt 260.0 lb

## 2023-11-27 DIAGNOSIS — R748 Abnormal levels of other serum enzymes: Secondary | ICD-10-CM | POA: Diagnosis not present

## 2023-11-27 DIAGNOSIS — K760 Fatty (change of) liver, not elsewhere classified: Secondary | ICD-10-CM | POA: Diagnosis not present

## 2023-11-27 DIAGNOSIS — M791 Myalgia, unspecified site: Secondary | ICD-10-CM

## 2023-11-27 DIAGNOSIS — E785 Hyperlipidemia, unspecified: Secondary | ICD-10-CM

## 2023-11-27 DIAGNOSIS — K719 Toxic liver disease, unspecified: Secondary | ICD-10-CM

## 2023-11-27 DIAGNOSIS — T466X5D Adverse effect of antihyperlipidemic and antiarteriosclerotic drugs, subsequent encounter: Secondary | ICD-10-CM

## 2023-11-27 DIAGNOSIS — Z79899 Other long term (current) drug therapy: Secondary | ICD-10-CM

## 2023-11-27 DIAGNOSIS — T466X5A Adverse effect of antihyperlipidemic and antiarteriosclerotic drugs, initial encounter: Secondary | ICD-10-CM

## 2023-11-27 NOTE — Telephone Encounter (Signed)
-----   Message from Nurse Porter HERO sent at 11/27/2023 12:06 PM EDT ----- Regarding: REPATHA START PER DR. HILTY Dr. Mona would like to pursue Repatha on this pt pending PA  Can you help with this and let us  know the status?   Thanks, [employer]

## 2023-11-27 NOTE — Telephone Encounter (Signed)
 Pharmacy Patient Advocate Encounter   Received notification from Physician's Office that prior authorization for REPATHA is required/requested.   Insurance verification completed.   The patient is insured through [facility] .   Per test claim: PA required; PA submitted to above mentioned insurance via Latent Key/confirmation #/EOC [facility] Status is pending

## 2023-11-27 NOTE — Patient Instructions (Signed)
 Medication Instructions:   Dr. Mona recommends REPATHA (PCSK9). This is an injectable cholesterol medication self-administered once every 14 days. This medication will likely need prior approval with your insurance company, which we will work on. If the medication is not approved initially, we may need to do an appeal with your insurance. If approved, we will provide you with copay and cost information. We'll then send the prescription to your pharmacy. We would have you complete another set of fasting labs between 3-4 months to reassess cholesterol.   REPATHA is self-injected once every 14 days in subcutaneous or fatty tissue - such as belly or side/outer/upper thigh. It is best stored in the refrigerator but is stable at room temp up to 28 days. Please take the pen-injector out of fridge about 30 minutes - 1 hour prior to injection, to allow it to warm closer to room temperature.   This medication is very effective in lowering LDL and can lower LPa, as Dr. Mona mentioned. It is also generally well tolerated -- most common reaction may be cold-like symptoms such as runny nose, scratchy throat, as this is an antibody therapy. It is generally self-limiting and after a few doses, your body should have normalized to the medication.   Here is a demo video: [email or web address]   If you need a co-pay card for Repatha: Lawsponsor.fr If you need a co-pay card for Praluent: [email or web address]  Patient Assistance:    These foundations have funds at various times.   The [employer]: https://www.panfoundation.org/disease-funds/hypercholesterolemia/ -- can sign up for wait list  The [facility] offers assistance to help pay for medication copays.  They will cover copays for all cholesterol lowering meds, including statins, fibrates, omega-3 fish oils like Vascepa, ezetimibe, Repatha, Praluent, Nexletol,  Nexlizet.  The cards are usually good for $2,500 or 12 months, whichever comes first. Our fax # is [phone] (you will need this to apply) Go to healthwellfoundation.org Click on "Apply Now" Answer questions as to whom is applying (patient or representative) Your disease fund will be "hypercholesterolemia - Medicare access" They will ask questions about finances and which medications you are taking for cholesterol When you submit, the approval is usually within minutes.  You will need to print the card information from the site You will need to show this information to your pharmacy, they will bill your Medicare Part D plan first -then bill [employer] Well --for the copay.   You can also call them at [phone], although the hold times can be quite long.     *If you need a refill on your cardiac medications before your next appointment, please call your pharmacy*   Lab Work:  1.)  IN ONE MONTH (AROUND 10/30 OR AFTER) HERE 3RD FLOOR [address]--LFTs  2.)  IN 3 MONTHS (AROUND 12/30 OR AFTER) BACK HERE AT 3RD FLOOR [address] --NMR LIPOPROFILE AND LIPOPROTEIN A--PLEASE COME FASTING TO THIS LAB APPOINTMENT  If you have labs (blood work) drawn today and your tests are completely normal, you will receive your results only by: MyChart Message (if you have MyChart) OR A paper copy in the mail If you have any lab test that is abnormal or we need to change your treatment, we will call you to review the results.    Follow-Up:  AS NEEDED WITH DR. HILTY

## 2023-11-27 NOTE — Progress Notes (Signed)
 LIPID CLINIC CONSULT NOTE  Chief Complaint:  Manage dyslipidemia  Primary Care Physician: Andrea Otto DASEN, MD  Primary Cardiologist:  None  HPI:  Andrea [name] is a 68 y.o. female who is being seen today for the evaluation of dyslipidemia at the request of Andrea, Otto DASEN, MD. This is a pleasant 68 year old female, referred for evaluation management of dyslipidemia.  She has a history of high cholesterol in the past but has tried at least 2 statin medications which caused significant lower extremity myalgias.  She also has had elevated liver enzymes in the setting of this.  Notably, her AST and ALT were 176 and 66 but recently improved off of statin therapy down to 79 and 33.  She had an ultrasound of the right upper quadrant which showed fatty liver.  Recent lipids off of statin medications include total cholesterol 231, triglycerides 152, HDL 40 and LDL 163.  PMHx:  Past Medical History:  Diagnosis Date   Abnormal mammogram 08/28/2014   08/28/14 - Probable benign microcalcifications over the outer mid to lower left breast. Bi-Rads 3. F/U 6 months    Allergic rhinitis 06/22/2014   Arm pain, lateral, left 04/28/2016   Asthma    Breast cancer (HCC) 03/16/2022   Carpal tunnel syndrome 04/26/2006   Qualifier: Diagnosis of  By: Andrea [name]     Femur fracture [facility]) 05/14/2021   Femur fracture, right (HCC) 05/14/2021   First degree AV block 09/22/2013   EKG 09/22/13    GANGLION CYST, WRIST, RIGHT 10/06/2008   Qualifier: Diagnosis of  By: Lucille  MD, [name]     Hypothyroidism    Imbalance 01/12/2017   Palpitations 09/22/2013   Right wrist pain 04/28/2016    Past Surgical History:  Procedure Laterality Date   ABDOMINAL HYSTERECTOMY     fibroids   BREAST BIOPSY Left 01/24/2022   MM LT BREAST BX W LOC DEV 1ST LESION IMAGE BX SPEC STEREO GUIDE 01/24/2022 [facility]MO[facility]   BREAST BIOPSY  03/15/2022   MM LT RADIOACTIVE SEED LOC MAMMO GUIDE 03/15/2022 [facility]  MAMMOGRAPHY   BREAST LUMPECTOMY WITH RADIOACTIVE SEED LOCALIZATION Left 03/16/2022   Procedure: LEFT BREAST LUMPECTOMY WITH RADIOACTIVE SEED LOCALIZATION;  Surgeon: Andrea Debby, MD;  Location: [facility];  Service: General;  Laterality: Left;   COLONOSCOPY N/A 10/04/2023   Procedure: COLONOSCOPY;  Surgeon: Andrea Glendia BRAVO, MD;  Location: [facility];  Service: Gastroenterology;  Laterality: N/A;   FEMUR IM NAIL Right 05/14/2021   Procedure: INTRAMEDULLARY (IM) RETROGRADE FEMORAL NAILING;  Surgeon: Andrea Anes, MD;  Location: [facility];  Service: Orthopedics;  Laterality: Right;    FAMHx:  Family History  Problem Relation Age of Onset   Brain cancer Mother        cervial, pituitary, bone?   Hypertension Mother    Lung cancer Father    Alcohol abuse Father    Hypertension Sister    Hypothyroidism Sister    Hypertension Sister    Hypothyroidism Sister    Hypertension Sister    Hypothyroidism Sister    Hypertension Sister    Hypothyroidism Sister    Hypertension Sister    Hypothyroidism Sister    Prostate cancer Brother    Lung cancer Brother        lung   Ovarian cancer Maternal Aunt    Colon cancer Neg Hx    Rectal cancer Neg Hx    Stomach cancer Neg Hx     SOCHx:   reports  that she has never smoked. She has never used smokeless tobacco. She reports that she does not drink alcohol and does not use drugs.  ALLERGIES:  No Known Allergies  ROS: Pertinent items noted in HPI and remainder of comprehensive ROS otherwise negative.  HOME MEDS: Current Outpatient Medications on File Prior to Visit  Medication Sig Dispense Refill   albuterol  (VENTOLIN  HFA) 108 (90 Base) MCG/ACT inhaler Inhale 2 puffs into the lungs every 6 (six) hours as needed for wheezing or shortness of breath. 18 g 1   amLODipine  (NORVASC ) 5 MG tablet Take 1 tablet (5 mg total) by mouth at bedtime. 90 tablet 1   anastrozole  (ARIMIDEX ) 1 MG tablet Take 1 tablet by mouth once daily 90  tablet 0   carvedilol  (COREG ) 6.25 MG tablet TAKE 1 TABLET BY MOUTH TWICE DAILY WITH A MEAL 180 tablet 1   famotidine  (PEPCID ) 20 MG tablet TAKE 1 TABLET BY MOUTH ONCE DAILY AS NEEDED HEARTBURN  OR  INDIGESTION 90 tablet 0   gabapentin  (NEURONTIN ) 100 MG capsule Take 1 capsule by mouth twice daily 60 capsule 0   levothyroxine  (SYNTHROID ) 125 MCG tablet TAKE 1 TABLET BY MOUTH ONCE DAILY BEFORE BREAKFAST 90 tablet 1   Melatonin 5 MG CHEW Chew 5 mg by mouth at bedtime as needed. 30 tablet 0   sertraline  (ZOLOFT ) 50 MG tablet Take 1 tablet (50 mg total) by mouth daily.     Vitamin D , Cholecalciferol, 50 MCG (2000 UT) CAPS Take 2,000 Units by mouth daily.     No current facility-administered medications on file prior to visit.    LABS/IMAGING: No results found for this or any previous visit (from the past 48 hours). No results found.  LIPID PANEL:    Component Value Date/Time   CHOL 231 (H) 08/14/2023 1037   TRIG 152 (H) 08/14/2023 1037   HDL 40 08/14/2023 1037   CHOLHDL 5.8 (H) 08/14/2023 1037   CHOLHDL 3.9 10/07/2015 1211   VLDL 25 10/07/2015 1211   LDLCALC 163 (H) 08/14/2023 1037    No results found for: LIPOA   WEIGHTS: Wt Readings from Last 3 Encounters:  11/27/23 260 lb (117.9 kg)  10/04/23 263 lb 0.1 oz (119.3 kg)  09/27/23 263 lb 1.6 oz (119.3 kg)    VITALS: BP 128/86   Pulse 78   Ht 5' 4 (1.626 m)   Wt 260 lb (117.9 kg)   SpO2 94%   BMI 44.63 kg/m   EXAM: Deferred  EKG: Deferred  ASSESSMENT: Dyslipidemia, goal LDL less than 70 MASLD Morbid obesity Statin intolerance-myalgias, hepatotoxicity  PLAN: 1.   Andrea [name] has a dyslipidemia with goal LDL less than 70 and remains well above target.  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  She also had myalgias associated with them.  We need to consider alternative therapies for lipid lowering.  She needs at least 50% reduction in cholesterol and I think is a  good candidate for PCSK9 inhibitor.  This is unlikely to cause any significant change in her liver enzymes which we will monitor closely after 2 initial doses of therapy.  Will reach out for prior authorization.  Plan repeat lipids then in about 3 months including NMR and LP(a).  Thanks again for the kind referral.  Vinie KYM Maxcy, MD, [facility], FNLA, FACP  [place]  [facility]  Medical Director of the [facility] Lipid [facility] &  Cardiovascular Risk Reduction Clinic Diplomate of the American  Board of Clinical Lipidology Attending Cardiologist  Direct Dial: [phone]  Fax: [phone]  Website:  [email or web address]  Vinie BROCKS Desmon Hitchner 11/27/2023, 12:00 PM

## 2023-11-28 ENCOUNTER — Other Ambulatory Visit (HOSPITAL_COMMUNITY): Payer: Self-pay

## 2023-11-28 MED ORDER — REPATHA SURECLICK 140 MG/ML ~~LOC~~ SOAJ: 140.0000 mg | SUBCUTANEOUS | 3 refills | Status: AC

## 2023-11-28 NOTE — Addendum Note (Signed)
 Addended by: GLADIS PORTER HERO on: 11/28/2023 04:32 PM   Modules accepted: Orders

## 2023-11-28 NOTE — Telephone Encounter (Signed)
 Left a detailed on the pts voicemail that we got the PA approved for her Repatha through [facility] for copay $0.   In the message I endorsed that we will send the Repatha rx to [facility] to fill for the pt.  Advised the pt to call back with any additional questions/concerns regarding this matter.

## 2023-11-28 NOTE — Telephone Encounter (Signed)
 Pharmacy Patient Advocate Encounter  Received notification from [employer] that Prior Authorization for REPATHA has been APPROVED from 11/27/23 to 05/26/24. Ran test claim, Copay is $0. This test claim was processed through [facility]- copay amounts may vary at other pharmacies due to pharmacy/plan contracts, or as the patient moves through the different stages of their insurance plan.

## 2023-12-06 ENCOUNTER — Ambulatory Visit: Admitting: Physical Medicine and Rehabilitation

## 2023-12-06 ENCOUNTER — Other Ambulatory Visit: Payer: Self-pay

## 2023-12-06 VITALS — BP 134/96 | HR 77

## 2023-12-06 DIAGNOSIS — M5416 Radiculopathy, lumbar region: Secondary | ICD-10-CM | POA: Diagnosis not present

## 2023-12-06 MED ORDER — METHYLPREDNISOLONE ACETATE 80 MG/ML IJ SUSP
40.0000 mg | Freq: Once | INTRAMUSCULAR | Status: AC
  Administered 2023-12-06: 40 mg

## 2023-12-06 NOTE — Progress Notes (Signed)
 Pain Scale   Average Pain 5 Patient advising she has chronic lower back pain radiating to left side.         +Driver, -BT, -Dye Allergies.

## 2023-12-10 ENCOUNTER — Other Ambulatory Visit: Payer: Self-pay | Admitting: Family Medicine

## 2023-12-10 MED ORDER — SERTRALINE HCL 50 MG PO TABS: 50.0000 mg | ORAL_TABLET | Freq: Every day | ORAL | 1 refills | Status: AC

## 2023-12-17 NOTE — Progress Notes (Signed)
 Andrea [name] - 68 y.o. female MRN [id number]  Date of birth: 18-Mar-1955  Office Visit Note: Visit Date: 12/06/2023 PCP: Anders Otto DASEN, MD Referred by: Anders Otto DASEN, MD  Subjective: Chief Complaint  Patient presents with   Lower Back - Pain   HPI:  Andrea [name] is a 68 y.o. female who comes in today at the request of Duwaine Pouch, FNP for planned Left L5-S1 Lumbar Transforaminal epidural steroid injection with fluoroscopic guidance.  The patient has failed conservative care including home exercise, medications, time and activity modification.  This injection will be diagnostic and hopefully therapeutic.  Please see requesting physician notes for further details and justification.   ROS Otherwise per HPI.  Assessment & Plan: Visit Diagnoses:    ICD-10-CM   1. Lumbar radiculopathy  M54.16 XR C-ARM NO REPORT    Epidural Steroid injection    methylPREDNISolone acetate (DEPO-MEDROL) injection 40 mg      Plan: No additional findings.   Meds & Orders:  Meds ordered this encounter  Medications   methylPREDNISolone acetate (DEPO-MEDROL) injection 40 mg    Orders Placed This Encounter  Procedures   XR C-ARM NO REPORT   Epidural Steroid injection    Follow-up: No follow-ups on file.   Procedures: No procedures performed  Lumbosacral Transforaminal Epidural Steroid Injection - Sub-Pedicular Approach with Fluoroscopic Guidance  Patient: Andrea [name]      Date of Birth: 1955/05/10 MRN: [id number] PCP: Anders Otto DASEN, MD      Visit Date: 12/06/2023   Universal Protocol:    Date/Time: 12/06/2023  Consent Given By: the patient  Position: PRONE  Additional Comments: Vital signs were monitored before and after the procedure. Patient was prepped and draped in the usual sterile fashion. The correct patient, procedure, and site was verified.   Injection Procedure Details:   Procedure diagnoses: Lumbar radiculopathy [M54.16]    Meds  Administered:  Meds ordered this encounter  Medications   methylPREDNISolone acetate (DEPO-MEDROL) injection 40 mg    Laterality: Left  Location/Site: L5  Needle:6.0 in., 22 ga.  Short bevel or Quincke spinal needle  Needle Placement: Transforaminal  Findings:    -Comments: Excellent flow of contrast along the nerve, nerve root and into the epidural space.  Procedure Details: After squaring off the end-plates to get a true AP view, the C-arm was positioned so that an oblique view of the foramen as noted above was visualized. The target area is just inferior to the nose of the scotty dog or sub pedicular. The soft tissues overlying this structure were infiltrated with 2-3 ml. of 1% Lidocaine  without Epinephrine .  The spinal needle was inserted toward the target using a trajectory view along the fluoroscope beam.  Under AP and lateral visualization, the needle was advanced so it did not puncture dura and was located close the 6 O'Clock position of the pedical in AP tracterory. Biplanar projections were used to confirm position. Aspiration was confirmed to be negative for CSF and/or blood. A 1-2 ml. volume of Isovue-250 was injected and flow of contrast was noted at each level. Radiographs were obtained for documentation purposes.   After attaining the desired flow of contrast documented above, a 0.5 to 1.0 ml test dose of 0.25% Marcaine  was injected into each respective transforaminal space.  The patient was observed for 90 seconds post injection.  After no sensory deficits were reported, and normal lower extremity motor function was noted,   the above injectate was administered so that  equal amounts of the injectate were placed at each foramen (level) into the transforaminal epidural space.   Additional Comments:  The patient tolerated the procedure well Dressing: 2 x 2 sterile gauze and Band-Aid    Post-procedure details: Patient was observed during the procedure. Post-procedure  instructions were reviewed.  Patient left the clinic in stable condition.    Clinical History: CLINICAL DATA:  68 year old female with persistent low back pain.   EXAM: MRI LUMBAR SPINE WITHOUT CONTRAST   TECHNIQUE: Multiplanar, multisequence MR imaging of the lumbar spine was performed. No intravenous contrast was administered.   COMPARISON:  None Available.   FINDINGS: Segmentation: Lumbar segmentation appears to be normal and will be designated as such for this report.   Alignment: Preserved, mildly exaggerated lumbar lordosis. Superimposed degenerative appearing grade 1 anterolisthesis of L5 on S1 measuring 5-6 mm. No significant scoliosis.   Vertebrae: Mix of acute and chronic degenerative endplate marrow signal changes at L5-S1, patchy marrow edema associated anteriorly and asymmetrically to the right. Background bone marrow signal is heterogeneous but within normal limits. No other marrow edema identified. Intact visible sacrum and SI joints.   Conus medullaris and cauda equina: Conus extends to the T12-L1 level. No lower spinal cord or conus signal abnormality. Normal cauda equina nerve roots and capacious spinal canal.   Paraspinal and other soft tissues: 2 cm gallstones are visible. Negative other visible abdominal viscera. Lumbar paraspinal muscle atrophy is generalized. Negative paraspinal soft tissues otherwise.   Disc levels:   T11-T12: Mild disc desiccation and disc bulging. Mild to moderate facet hypertrophy. No stenosis.   T12-L1:  Negative disc. Mild facet hypertrophy. No stenosis.   L1-L2:  Negative.   L2-L3:  Negative.   L3-L4:  Negative disc. Mild facet hypertrophy. No stenosis.   L4-L5: Negative disc. Mild to moderate facet hypertrophy. No stenosis.   L5-S1: Grade 1 spondylolisthesis. Heterogeneous mild disc desiccation. Mild circumferential disc bulge/pseudo disc and endplate spurring. Moderate to severe facet hypertrophy  somewhat greater on the left. Capacious spinal canal, no spinal or lateral recess stenosis. But moderate left and mild to moderate right L5 foraminal stenosis.   IMPRESSION: 1. Grade 1 spondylolisthesis at L5-S1 with disc and endplate degeneration, up to severe facet degeneration. No spinal or lateral recess stenosis. Up to moderate foraminal stenosis. Query L5 radiculitis. 2. No other significant lumbar disc degeneration. Intermittent facet hypertrophy. Capacious spinal canal. 3. Cholelithiasis.     Electronically Signed   By: VEAR Hurst M.D.   On: 06/05/2023 15:47     Objective:  VS:  HT:    WT:   BMI:     BP:(!) 134/96  HR:77bpm  TEMP: ( )  RESP:  Physical Exam Vitals and nursing note reviewed.  Constitutional:      General: She is not in acute distress.    Appearance: Normal appearance. She is obese. She is not ill-appearing.  HENT:     Head: Normocephalic and atraumatic.     Right Ear: External ear normal.     Left Ear: External ear normal.  Eyes:     Extraocular Movements: Extraocular movements intact.  Cardiovascular:     Rate and Rhythm: Normal rate.     Pulses: Normal pulses.  Pulmonary:     Effort: Pulmonary effort is normal. No respiratory distress.  Abdominal:     General: There is no distension.     Palpations: Abdomen is soft.  Musculoskeletal:        General: Tenderness present.  Cervical back: Neck supple.     Right lower leg: No edema.     Left lower leg: No edema.     Comments: Patient has good distal strength with no pain over the greater trochanters.  No clonus or focal weakness.  Skin:    Findings: No erythema, lesion or rash.  Neurological:     General: No focal deficit present.     Mental Status: She is alert and oriented to person, place, and time.     Sensory: No sensory deficit.     Motor: No weakness or abnormal muscle tone.     Coordination: Coordination normal.  Psychiatric:        Mood and Affect: Mood normal.         Behavior: Behavior normal.      Imaging: No results found.

## 2023-12-17 NOTE — Procedures (Signed)
 Lumbosacral Transforaminal Epidural Steroid Injection - Sub-Pedicular Approach with Fluoroscopic Guidance  Patient: Andrea [name]      Date of Birth: [birth year] MRN: [id number] PCP: Anders Otto DASEN, MD      Visit Date: 12/06/2023   Universal Protocol:    Date/Time: 12/06/2023  Consent Given By: the patient  Position: PRONE  Additional Comments: Vital signs were monitored before and after the procedure. Patient was prepped and draped in the usual sterile fashion. The correct patient, procedure, and site was verified.   Injection Procedure Details:   Procedure diagnoses: Lumbar radiculopathy [M54.16]    Meds Administered:  Meds ordered this encounter  Medications   methylPREDNISolone acetate (DEPO-MEDROL) injection 40 mg    Laterality: Left  Location/Site: L5  Needle:6.0 in., 22 ga.  Short bevel or Quincke spinal needle  Needle Placement: Transforaminal  Findings:    -Comments: Excellent flow of contrast along the nerve, nerve root and into the epidural space.  Procedure Details: After squaring off the end-plates to get a true AP view, the C-arm was positioned so that an oblique view of the foramen as noted above was visualized. The target area is just inferior to the nose of the scotty dog or sub pedicular. The soft tissues overlying this structure were infiltrated with 2-3 ml. of 1% Lidocaine  without Epinephrine .  The spinal needle was inserted toward the target using a trajectory view along the fluoroscope beam.  Under AP and lateral visualization, the needle was advanced so it did not puncture dura and was located close the 6 O'Clock position of the pedical in AP tracterory. Biplanar projections were used to confirm position. Aspiration was confirmed to be negative for CSF and/or blood. A 1-2 ml. volume of Isovue-250 was injected and flow of contrast was noted at each level. Radiographs were obtained for documentation purposes.   After attaining the desired  flow of contrast documented above, a 0.5 to 1.0 ml test dose of 0.25% Marcaine  was injected into each respective transforaminal space.  The patient was observed for 90 seconds post injection.  After no sensory deficits were reported, and normal lower extremity motor function was noted,   the above injectate was administered so that equal amounts of the injectate were placed at each foramen (level) into the transforaminal epidural space.   Additional Comments:  The patient tolerated the procedure well Dressing: 2 x 2 sterile gauze and Band-Aid    Post-procedure details: Patient was observed during the procedure. Post-procedure instructions were reviewed.  Patient left the clinic in stable condition.

## 2023-12-20 ENCOUNTER — Telehealth: Payer: Self-pay | Admitting: Physical Medicine and Rehabilitation

## 2023-12-20 NOTE — Telephone Encounter (Signed)
 Pt called asking for a call back. Pt had injection 10/9 and feeling discomfort and a lot of popping in her back. Please call pt at [phone].

## 2023-12-21 ENCOUNTER — Telehealth: Payer: Self-pay

## 2023-12-21 DIAGNOSIS — M5416 Radiculopathy, lumbar region: Secondary | ICD-10-CM

## 2023-12-21 NOTE — Telephone Encounter (Signed)
 Patient calls nurse line requesting a referral to PT.  She reports her Orthopedic provider recommend PT for her back.   She reports he advised her to have PCP make this referral.   Advised will forward to PCP.

## 2023-12-21 NOTE — Telephone Encounter (Signed)
PT referral ordered

## 2023-12-27 DIAGNOSIS — Z79899 Other long term (current) drug therapy: Secondary | ICD-10-CM | POA: Diagnosis not present

## 2023-12-27 DIAGNOSIS — R748 Abnormal levels of other serum enzymes: Secondary | ICD-10-CM | POA: Diagnosis not present

## 2023-12-27 DIAGNOSIS — E785 Hyperlipidemia, unspecified: Secondary | ICD-10-CM | POA: Diagnosis not present

## 2023-12-28 LAB — HEPATIC FUNCTION PANEL
ALT: 50 IU/L — ABNORMAL HIGH (ref 0–32)
AST: 90 IU/L — ABNORMAL HIGH (ref 0–40)
Albumin: 3.5 g/dL — ABNORMAL LOW (ref 3.9–4.9)
Alkaline Phosphatase: 155 IU/L — ABNORMAL HIGH (ref 49–135)
Bilirubin Total: 0.5 mg/dL (ref 0.0–1.2)
Bilirubin, Direct: 0.2 mg/dL (ref 0.00–0.40)
Total Protein: 7.2 g/dL (ref 6.0–8.5)

## 2023-12-28 LAB — NMR, LIPOPROFILE
Cholesterol, Total: 177 mg/dL (ref 100–199)
HDL Particle Number: 15.3 umol/L — ABNORMAL LOW (ref >=30.5)
HDL-C: 44 mg/dL (ref >39)
LDL Particle Number: 1493 nmol/L — ABNORMAL HIGH (ref <1000)
LDL Size: 21.8 nm (ref >20.5)
LDL-C (NIH Calc): 110 mg/dL — ABNORMAL HIGH (ref 0–99)
LP-IR Score: 30 (ref <=45)
Small LDL Particle Number: 202 nmol/L (ref <=527)
Triglycerides: 129 mg/dL (ref 0–149)

## 2023-12-31 ENCOUNTER — Encounter: Payer: Self-pay | Admitting: Radiology

## 2023-12-31 LAB — LIPOPROTEIN A (LPA): Lipoprotein (a): 74.1 nmol/L (ref <75.0)

## 2024-01-02 ENCOUNTER — Ambulatory Visit: Payer: Self-pay | Admitting: Internal Medicine

## 2024-01-02 DIAGNOSIS — R748 Abnormal levels of other serum enzymes: Secondary | ICD-10-CM

## 2024-01-02 DIAGNOSIS — E785 Hyperlipidemia, unspecified: Secondary | ICD-10-CM

## 2024-01-04 ENCOUNTER — Other Ambulatory Visit: Payer: Self-pay | Admitting: Family Medicine

## 2024-01-07 ENCOUNTER — Other Ambulatory Visit: Payer: 59

## 2024-01-10 ENCOUNTER — Ambulatory Visit
Admission: RE | Admit: 2024-01-10 | Discharge: 2024-01-10 | Disposition: A | Source: Ambulatory Visit | Attending: Hematology and Oncology

## 2024-01-10 DIAGNOSIS — D0512 Intraductal carcinoma in situ of left breast: Secondary | ICD-10-CM

## 2024-01-10 NOTE — Therapy (Incomplete)
 OUTPATIENT PHYSICAL THERAPY THORACOLUMBAR EVALUATION   Patient Name: Andrea [name] MRN: [id number] DOB:[birth year], 68 y.o., female Today's Date: 01/10/2024  END OF SESSION:   Past Medical History:  Diagnosis Date   Abnormal mammogram 08/28/2014   08/28/14 - Probable benign microcalcifications over the outer mid to lower left breast. Bi-Rads 3. F/U 6 months    Allergic rhinitis 06/22/2014   Arm pain, lateral, left 04/28/2016   Asthma    Breast cancer (HCC) 03/16/2022   Carpal tunnel syndrome 04/26/2006   Qualifier: Diagnosis of  By: Benjamine MD, Aaron     Femur fracture [facility]) 05/14/2021   Femur fracture, right (HCC) 05/14/2021   First degree AV block 09/22/2013   EKG 09/22/13    GANGLION CYST, WRIST, RIGHT 10/06/2008   Qualifier: Diagnosis of  By: Lucille  MD, Taineisha     Hypothyroidism    Imbalance 01/12/2017   Palpitations 09/22/2013   Right wrist pain 04/28/2016   Past Surgical History:  Procedure Laterality Date   ABDOMINAL HYSTERECTOMY     fibroids   BREAST BIOPSY Left 01/24/2022   MM LT BREAST BX W LOC DEV 1ST LESION IMAGE BX SPEC STEREO GUIDE 01/24/2022 [facility]   BREAST BIOPSY  03/15/2022   MM LT RADIOACTIVE SEED LOC MAMMO GUIDE 03/15/2022 [facility]   BREAST LUMPECTOMY WITH RADIOACTIVE SEED LOCALIZATION Left 03/16/2022   Procedure: LEFT BREAST LUMPECTOMY WITH RADIOACTIVE SEED LOCALIZATION;  Surgeon: Vanderbilt Ned, MD;  Location: [facility];  Service: General;  Laterality: Left;   COLONOSCOPY N/A 10/04/2023   Procedure: COLONOSCOPY;  Surgeon: Stacia Glendia BRAVO, MD;  Location: [facility];  Service: Gastroenterology;  Laterality: N/A;   FEMUR IM NAIL Right 05/14/2021   Procedure: INTRAMEDULLARY (IM) RETROGRADE FEMORAL NAILING;  Surgeon: Beuford Anes, MD;  Location: [facility];  Service: Orthopedics;  Laterality: Right;   Patient Active Problem List   Diagnosis Date Noted   Family history of colon cancer 10/04/2023   Benign  neoplasm of colon 10/04/2023   Lumbar radiculopathy 06/29/2023   CKD stage 3a, GFR 45-59 ml/min (HCC) 06/29/2023   Elevated liver enzymes 05/04/2023   Hip pain 04/17/2023   Insomnia 02/06/2023   Colon cancer screening 06/19/2022   Skin desquamation 06/14/2022   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   Normocytic anemia 07/12/2021   History of femur fracture 07/11/2021   Seasonal allergies 04/08/2021   Restrictive lung disease 03/25/2021   Prediabetes 03/25/2021   Thoracic aortic ectasia 09/07/2020   Congestive heart failure (HCC) 08/03/2020   Esophageal reflux 08/14/2017   Vitamin D  deficiency 01/12/2017   Cataract 09/19/2013   Hypothyroidism 04/26/2006   Hyperlipidemia 04/26/2006   BMI 45.0-49.9, adult (HCC) 04/26/2006   Major depressive disorder, recurrent episode 04/26/2006   HYPERTENSION, BENIGN SYSTEMIC 04/26/2006    PCP: Anders Otto DASEN, MD  REFERRING PROVIDER: Anders Otto DASEN, MD  REFERRING DIAG: M54.16 (ICD-10-CM) - Lumbar radiculopathy   Rationale for Evaluation and Treatment: Rehabilitation  THERAPY DIAG:  No diagnosis found.  ONSET DATE: ***  SUBJECTIVE:  SUBJECTIVE STATEMENT: ***  PERTINENT HISTORY:  ***  PAIN:  Are you having pain? Yes: NPRS scale: *** Pain location: *** Pain description: *** Aggravating factors: *** Relieving factors: ***  PRECAUTIONS: {Therapy precautions:24002}  RED FLAGS: {PT Red Flags:29287}   WEIGHT BEARING RESTRICTIONS: {Yes ***/No:24003}  FALLS:  Has patient fallen in last 6 months? {fallsyesno:27318}  LIVING ENVIRONMENT: Lives with: {[facility] lives with:25569::lives with their family} Lives in: {Lives in:25570} Stairs: {opstairs:27293} Has following equipment at home: {Assistive devices:23999}  OCCUPATION:  ***  PLOF: {PLOF:24004}  PATIENT GOALS: ***  NEXT MD VISIT: ***  OBJECTIVE:  Note: Objective measures were completed at Evaluation unless otherwise noted.  DIAGNOSTIC FINDINGS:  06/05/23 MRI lumbar IMPRESSION: 1. Grade 1 spondylolisthesis at L5-S1 with disc and endplate degeneration, up to severe facet degeneration. No spinal or lateral recess stenosis. Up to moderate foraminal stenosis. Query L5 radiculitis. 2. No other significant lumbar disc degeneration. Intermittent facet hypertrophy. Capacious spinal canal. 3. Cholelithiasis.  PATIENT SURVEYS:  {rehab surveys:24030}  COGNITION: Overall cognitive status: {cognition:24006}     SENSATION: {sensation:27233}  MUSCLE LENGTH: Hamstrings: Right *** deg; Left *** deg Debby test: Right *** deg; Left *** deg  POSTURE: {posture:25561}  PALPATION: ***  LUMBAR ROM:   AROM eval  Flexion   Extension   Right lateral flexion   Left lateral flexion   Right rotation   Left rotation    (Blank rows = not tested)  LOWER EXTREMITY ROM:     {AROM/PROM:27142}  Right eval Left eval  Hip flexion    Hip extension    Hip abduction    Hip adduction    Hip internal rotation    Hip external rotation    Knee flexion    Knee extension    Ankle dorsiflexion    Ankle plantarflexion    Ankle inversion    Ankle eversion     (Blank rows = not tested)  LOWER EXTREMITY MMT:    MMT Right eval Left eval  Hip flexion    Hip extension    Hip abduction    Hip adduction    Hip internal rotation    Hip external rotation    Knee flexion    Knee extension    Ankle dorsiflexion    Ankle plantarflexion    Ankle inversion    Ankle eversion     (Blank rows = not tested)  LUMBAR SPECIAL TESTS:  {lumbar special test:25242}  FUNCTIONAL TESTS:  {Functional tests:24029}  GAIT: Distance walked: *** Assistive device utilized: {Assistive devices:23999} Level of assistance: {Levels of assistance:24026} Comments:  ***  TREATMENT DATE: ***                                                                                                                                 PATIENT EDUCATION:  Education details: *** Person educated: {Person educated:25204} Education method: {Education Method:25205} Education comprehension: {Education Comprehension:25206}  HOME EXERCISE PROGRAM: ***  ASSESSMENT:  CLINICAL IMPRESSION: Patient is a 68 y.o. female who was seen today for physical therapy evaluation and treatment for M54.16 (ICD-10-CM) - Lumbar radiculopathy .   OBJECTIVE IMPAIRMENTS: {opptimpairments:25111}.   ACTIVITY LIMITATIONS: {activitylimitations:27494}  PARTICIPATION LIMITATIONS: {participationrestrictions:25113}  PERSONAL FACTORS: {Personal factors:25162} are also affecting patient's functional outcome.   REHAB POTENTIAL: {rehabpotential:25112}  CLINICAL DECISION MAKING: {clinical decision making:25114}  EVALUATION COMPLEXITY: {Evaluation complexity:25115}   GOALS:  SHORT TERM GOALS: Target date: ***  *** Baseline: Goal status: INITIAL  2.  *** Baseline:  Goal status: INITIAL  3.  *** Baseline:  Goal status: INITIAL  4.  *** Baseline:  Goal status: INITIAL  5.  *** Baseline:  Goal status: INITIAL  6.  *** Baseline:  Goal status: INITIAL  LONG TERM GOALS: Target date: ***  *** Baseline:  Goal status: INITIAL  2.  *** Baseline:  Goal status: INITIAL  3.  *** Baseline:  Goal status: INITIAL  4.  *** Baseline:  Goal status: INITIAL  5.  *** Baseline:  Goal status: INITIAL  6.  *** Baseline:  Goal status: INITIAL  PLAN:  PT FREQUENCY: {rehab frequency:25116}  PT DURATION: {rehab duration:25117}  PLANNED INTERVENTIONS: {rehab planned interventions:25118::97110-Therapeutic exercises,97530- Therapeutic [phone]- Neuromuscular re-education,97535- Self Rjmz,02859- Manual therapy,Patient/Family education}.  PLAN FOR NEXT SESSION:  ***   Dasie Daft, PT 01/10/2024, 6:12 PM

## 2024-01-11 ENCOUNTER — Ambulatory Visit: Attending: Family Medicine

## 2024-01-18 ENCOUNTER — Ambulatory Visit: Admitting: Physical Therapy

## 2024-01-22 ENCOUNTER — Ambulatory Visit: Attending: Family Medicine | Admitting: Physical Therapy

## 2024-01-22 ENCOUNTER — Encounter: Payer: Self-pay | Admitting: Physical Therapy

## 2024-01-22 ENCOUNTER — Other Ambulatory Visit: Payer: Self-pay

## 2024-01-22 DIAGNOSIS — M5459 Other low back pain: Secondary | ICD-10-CM | POA: Insufficient documentation

## 2024-01-22 DIAGNOSIS — R2689 Other abnormalities of gait and mobility: Secondary | ICD-10-CM | POA: Diagnosis present

## 2024-01-22 DIAGNOSIS — M79672 Pain in left foot: Secondary | ICD-10-CM | POA: Diagnosis present

## 2024-01-22 DIAGNOSIS — M6281 Muscle weakness (generalized): Secondary | ICD-10-CM | POA: Insufficient documentation

## 2024-01-22 NOTE — Therapy (Signed)
 OUTPATIENT PHYSICAL THERAPY THORACOLUMBAR EVALUATION   Patient Name: Andrea [name] MRN: [id number] DOB:[birth year], 68 y.o., female Today's Date: 01/22/2024  END OF SESSION:  PT End of Session - 01/22/24 1357     Visit Number 1    Number of Visits 9    Date for Recertification  03/18/24    Authorization Type [employer] Dual Complete    Authorization - Visit Number 1    Progress Note Due on Visit 10    PT Start Time 1400    PT Stop Time 1448    PT Time Calculation (min) 48 min    Activity Tolerance Patient tolerated treatment well    Behavior During Therapy WFL for tasks assessed/performed          Past Medical History:  Diagnosis Date   Abnormal mammogram 08/28/2014   08/28/14 - Probable benign microcalcifications over the outer mid to lower left breast. Bi-Rads 3. F/U 6 months    Allergic rhinitis 06/22/2014   Arm pain, lateral, left 04/28/2016   Asthma    Breast cancer (HCC) 03/16/2022   Carpal tunnel syndrome 04/26/2006   Qualifier: Diagnosis of  By: Benjamine MD, Aaron     Femur fracture [facility]) 05/14/2021   Femur fracture, right (HCC) 05/14/2021   First degree AV block 09/22/2013   EKG 09/22/13    GANGLION CYST, WRIST, RIGHT 10/06/2008   Qualifier: Diagnosis of  By: Lucille  MD, Taineisha     Hypothyroidism    Imbalance 01/12/2017   Palpitations 09/22/2013   Right wrist pain 04/28/2016   Past Surgical History:  Procedure Laterality Date   ABDOMINAL HYSTERECTOMY     fibroids   BREAST BIOPSY Left 01/24/2022   MM LT BREAST BX W LOC DEV 1ST LESION IMAGE BX SPEC STEREO GUIDE 01/24/2022 [facility]   BREAST BIOPSY  03/15/2022   MM LT RADIOACTIVE SEED LOC MAMMO GUIDE 03/15/2022 [facility]   BREAST LUMPECTOMY WITH RADIOACTIVE SEED LOCALIZATION Left 03/16/2022   Procedure: LEFT BREAST LUMPECTOMY WITH RADIOACTIVE SEED LOCALIZATION;  Surgeon: Vanderbilt Ned, MD;  Location: [facility];  Service: General;  Laterality: Left;   COLONOSCOPY N/A  10/04/2023   Procedure: COLONOSCOPY;  Surgeon: Stacia Glendia BRAVO, MD;  Location: [facility];  Service: Gastroenterology;  Laterality: N/A;   FEMUR IM NAIL Right 05/14/2021   Procedure: INTRAMEDULLARY (IM) RETROGRADE FEMORAL NAILING;  Surgeon: Beuford Anes, MD;  Location: [facility];  Service: Orthopedics;  Laterality: Right;   Patient Active Problem List   Diagnosis Date Noted   Family history of colon cancer 10/04/2023   Benign neoplasm of colon 10/04/2023   Lumbar radiculopathy 06/29/2023   CKD stage 3a, GFR 45-59 ml/min (HCC) 06/29/2023   Elevated liver enzymes 05/04/2023   Hip pain 04/17/2023   Insomnia 02/06/2023   Colon cancer screening 06/19/2022   Skin desquamation 06/14/2022   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   Normocytic anemia 07/12/2021   History of femur fracture 07/11/2021   Seasonal allergies 04/08/2021   Restrictive lung disease 03/25/2021   Prediabetes 03/25/2021   Thoracic aortic ectasia 09/07/2020   Congestive heart failure (HCC) 08/03/2020   Esophageal reflux 08/14/2017   Vitamin D  deficiency 01/12/2017   Cataract 09/19/2013   Hypothyroidism 04/26/2006   Hyperlipidemia 04/26/2006   BMI 45.0-49.9, adult (HCC) 04/26/2006   Major depressive disorder, recurrent episode 04/26/2006   HYPERTENSION, BENIGN SYSTEMIC 04/26/2006    PCP: Anders Otto DASEN, MD  REFERRING PROVIDER: Anders Otto DASEN, MD  REFERRING DIAG:  M54.16 (ICD-10-CM) - Lumbar radiculopathy    Rationale for Evaluation and Treatment: Rehabilitation  THERAPY DIAG:  Other low back pain  Pain in left foot  Muscle weakness (generalized)  Other abnormalities of gait and mobility  ONSET DATE: 2-3 years, episodic since injection  SUBJECTIVE:                                                                                                                                                                                           SUBJECTIVE STATEMENT: Pt states that she had 1  month of relief following epidural injection. Has had low back pain x2-3 years which has been overall worsening, had PT from May-September 2025 with good outcomes, symptoms returned prompting follow up with phys med and rehab. Pt reports numbness in L foot, no symptoms in RLE. Pt denies LLE symptoms other than the foot. Pain increases with activity or position, independent of time of day. Pt not currently completing exercises. Pt reports that symptoms will range in intensity from 0/10-7/10. Symptoms described as sharp, dull in the low back, numb in L foot, sometimes painful, starts okay then becomes numb, and then painful.   PERTINENT HISTORY:  Pt had epidural steroid injection 12/06/23 in the lumbar spine at L5 with Gardiner Masters, MD at [facility]. PCP placed referral for PT.   PAIN:  Are you having pain? Yes: NPRS scale: 6/10 Pain location: L foot Pain description: numb Aggravating factors: moving, weight bearing Relieving factors: laying down  PRECAUTIONS: None  RED FLAGS: Bowel or bladder incontinence: No and Cauda equina syndrome: No   WEIGHT BEARING RESTRICTIONS: No  FALLS:  Has patient fallen in last 6 months? Yes. Number of falls 1  LIVING ENVIRONMENT: Lives with: grandson stays with her since she has been having more trouble physically and medically, 68 years old- helps with grocery, upkeep, cooking Lives in: House/apartment Stairs: Yes: External: 3 steps; on left going up Has following equipment at home: None  OCCUPATION: retired custodial and concessions associate  PLOF: Independent  PATIENT GOALS: move around without pain or numbness   NEXT MD VISIT: PCP Feb 2026  OBJECTIVE:  Note: Objective measures were completed at Evaluation unless otherwise noted.  DIAGNOSTIC FINDINGS:  IMPRESSION: 06/05/23 1. Grade 1 spondylolisthesis at L5-S1 with disc and endplate degeneration, up to severe facet degeneration. No spinal or lateral recess stenosis. Up to  moderate foraminal stenosis. Query L5 radiculitis. 2. No other significant lumbar disc degeneration. Intermittent facet hypertrophy. Capacious spinal canal. 3. Cholelithiasis.  PATIENT SURVEYS:  Modified Oswestry:  MODIFIED OSWESTRY DISABILITY SCALE  Date: 01/22/24 Score  Pain intensity 1 = The  pain is bad, but I can manage without having to take pain medication  2. Personal care (washing, dressing, etc.) 1 =  I can take care of myself normally, but it increases my pain.  3. Lifting 5 =  I cannot lift or carry anything at all.  4. Walking 2 =  Pain prevents me from walking more than  mile.  5. Sitting 3 =  Pain prevents me from sitting more than  hour.  6. Standing 4 =  Pain prevents me from standing more than 10 minutes.  7. Sleeping 0 = Pain does not prevent me from sleeping well.  8. Social Life 4 =  Pain has restricted my social life to my home  9. Traveling 4 = My pain restricts my travel to short necessary journeys under 1/2 hour.  10. Employment/ Homemaking 5 = Pain prevents me from performing any job or homemaking chores.  Total 29/50   Interpretation of scores: Score Category Description  0-20% Minimal Disability The patient can cope with most living activities. Usually no treatment is indicated apart from advice on lifting, sitting and exercise  21-40% Moderate Disability The patient experiences more pain and difficulty with sitting, lifting and standing. Travel and social life are more difficult and they may be disabled from work. Personal care, sexual activity and sleeping are not grossly affected, and the patient can usually be managed by conservative means  41-60% Severe Disability Pain remains the main problem in this group, but activities of daily living are affected. These patients require a detailed investigation  61-80% Crippled Back pain impinges on all aspects of the patient's life. Positive intervention is required  81-100% Bed-bound These patients are either  bed-bound or exaggerating their symptoms  Bluford FORBES Zoe DELENA Karon DELENA, et al. Surgery versus conservative management of stable thoracolumbar fracture: the PRESTO feasibility RCT. [place] ([place]): [employer]; 2021 Nov. [facility] Technology Assessment, No. 25.62.) Appendix 3, Oswestry Disability Index category descriptors. Available from: Findjewelers.cz  Minimally Clinically Important Difference (MCID) = 12.8%  COGNITION: Overall cognitive status: Within functional limits for tasks assessed     SENSATION: S1 dermatome on LLE noted tingling   POSTURE: rounded shoulders, forward head, increased lumbar lordosis, and increased thoracic kyphosis  PALPATION: Slight inc in resting tension of paraspinals and lat muscles bilaterally, no TTP noted  LUMBAR ROM:   AROM eval  Flexion Nil loss, notes some dizziness  Extension 25% loss, no symptoms  Right lateral flexion Nil loss, no symptoms  Left lateral flexion Nil loss, L sided symptoms to the knee  Right rotation   Left rotation    (Blank rows = not tested)  LOWER EXTREMITY ROM:   WFL  Active  Right eval Left eval  Hip flexion    Hip extension    Hip abduction    Hip adduction    Hip internal rotation    Hip external rotation    Knee flexion    Knee extension    Ankle dorsiflexion    Ankle plantarflexion    Ankle inversion    Ankle eversion     (Blank rows = not tested)  LOWER EXTREMITY MMT:    MMT Right eval Left eval  Hip flexion 4+/5 3+/5  Hip extension 4/5 4-/5  Hip abduction 4-/5 3+/5  Hip adduction 4+/5 4+/5  Hip internal rotation    Hip external rotation    Knee flexion 4/5 4-/5  Knee extension 4+/5 4/5  Ankle dorsiflexion 5/5 5/5  Ankle plantarflexion  4+/5 4+/5  Ankle inversion    Ankle eversion     (Blank rows = not tested)   GAIT: Distance walked: lobby to treatment area Assistive device utilized: None Level of assistance: Complete Independence Comments:  inc weight shift bilaterally, reciprocal steps, dec LLE WB tolerance with slightly shortened RLE stride, no LOB noted  TREATMENT DATE:   [facility] Adult PT Treatment:                                                DATE: 01/22/24  Self Care: HEP developed and provided. Pt able to demonstrate correct form and technique, denies further questions Pt edu                                                                                                                                  PATIENT EDUCATION:  Education details: Pt educated on relevant anatomy, physiology, pathology, diagnosis, prognosis, progression of care, pain and activity modification related to low back pain Person educated: Patient Education method: Explanation, Demonstration, and Handouts Education comprehension: verbalized understanding and returned demonstration  HOME EXERCISE PROGRAM: Access Code: [email or web address] URL: [email or web address] Date: 01/22/2024 Prepared by: Stann Ohara  Exercises - Slouch Overcorrect on Swiss Ball  - 1 x daily - 7 x weekly - 3 sets - 10 reps - 2 hold - Supine Sciatic Nerve Glide  - 1 x daily - 7 x weekly - 2 sets - 15 reps - 2 hold  ASSESSMENT:  CLINICAL IMPRESSION: Patient is a 68 y.o. F who was seen today for physical therapy evaluation and treatment for low back pain. Pt has presence of intermittent L foot pain as well. Symptoms and presentation consistent with lumbar impingement which contributes to inc pain and numbness in L foot, LLE weakness, and low back pain. Inc neural tension in LLE and decreased tolerance to closing moment in the lumbar spine on the left side. Pt to initiate HEP of nerve glides and mid range lumbar mechanics in sitting to progress activity tolerance. Pt stands to benefit from continued skilled physical therapy to address deficit areas and restore safety with activities and participations at home and in the community.     OBJECTIVE IMPAIRMENTS: Abnormal gait,  decreased activity tolerance, decreased endurance, decreased knowledge of condition, decreased mobility, difficulty walking, decreased ROM, decreased strength, dizziness, impaired perceived functional ability, impaired sensation, obesity, and pain.   ACTIVITY LIMITATIONS: carrying, lifting, bending, sitting, standing, and squatting  PARTICIPATION LIMITATIONS: meal prep, cleaning, laundry, driving, and community activity  PERSONAL FACTORS: Age, Fitness, Past/current experiences, Time since onset of injury/illness/exacerbation, and 3+ comorbidities: restrictive lung disease, DCIS of breast, R hip fx, CKD are also affecting patient's functional outcome.   REHAB POTENTIAL: Good  CLINICAL DECISION MAKING: Stable/uncomplicated  EVALUATION COMPLEXITY: Low   GOALS: Goals reviewed with  patient? Yes  SHORT TERM GOALS: Target date: 02/22/24   Pt will report compliance with HEP to work towards ind and home management strategies Baseline: Goal status: INITIAL   2.  Pt will score no greater than 19/50 on ODI to demonstrate improved activity tolerance Baseline: 29/50 Goal status: INITIAL   3.  Pt will improve lumbar spine ROM to full and painless in order to demonstrate progress towards activity tolerance and improved function Baseline: see ROM chart Goal status: INITIAL   4.  Pt will report no greater than 4/10 pain over 5 consecutive days to demonstrate progress towards maintained reduction of symptoms Baseline: 0/10- 7/10 Goal status: INITIAL      LONG TERM GOALS: Target date: 03/18/24   Pt will score no greater than 9/50 on ODI to demonstrate improved activity tolerance Baseline:  Goal status: INITIAL   2.  Pt will report no greater than 1/10 pain over 7 consecutive days to demonstrate maintained reduction in symptoms and improved tolerance to activity Baseline: 0/10-7/10 Goal status: INITIAL   3.  Pt will be ind in the management of their symptoms at home and in the  community Baseline:  Goal status: INITIAL     PLAN:  PT FREQUENCY: 1-2x/week  PT DURATION: 8 weeks  PLANNED INTERVENTIONS: 97110-Therapeutic exercises, 97530- Therapeutic activity, [id number]- Neuromuscular re-education, 97535- Self Care, 02859- Manual therapy, [id number]- Gait training, [phone]- Aquatic Therapy, [phone]- Electrical stimulation (unattended), 97016- Vasopneumatic device, 20560 (1-2 muscles), 20561 (3+ muscles)- Dry Needling, Patient/Family education, Cryotherapy, and Moist heat.  PLAN FOR NEXT SESSION: progress core strength and stability, hip strengthening, endurance, modify HEP as indicated   Stann DELENA Ohara, PT 01/22/2024, 3:14 PM

## 2024-01-28 ENCOUNTER — Other Ambulatory Visit: Payer: Self-pay | Admitting: Hematology and Oncology

## 2024-02-05 ENCOUNTER — Other Ambulatory Visit (HOSPITAL_BASED_OUTPATIENT_CLINIC_OR_DEPARTMENT_OTHER)

## 2024-02-06 ENCOUNTER — Encounter: Payer: Self-pay | Admitting: Physical Therapy

## 2024-02-06 ENCOUNTER — Ambulatory Visit: Attending: Family Medicine | Admitting: Physical Therapy

## 2024-02-06 DIAGNOSIS — R2689 Other abnormalities of gait and mobility: Secondary | ICD-10-CM | POA: Diagnosis present

## 2024-02-06 DIAGNOSIS — M6281 Muscle weakness (generalized): Secondary | ICD-10-CM | POA: Insufficient documentation

## 2024-02-06 DIAGNOSIS — M79672 Pain in left foot: Secondary | ICD-10-CM

## 2024-02-06 DIAGNOSIS — M5459 Other low back pain: Secondary | ICD-10-CM | POA: Diagnosis present

## 2024-02-06 NOTE — Therapy (Signed)
 OUTPATIENT PHYSICAL THERAPY THORACOLUMBAR TREATMENT   Patient Name: Andrea [name] MRN: [id number] DOB:Oct 21, 1955, 68 y.o., female Today's Date: 02/06/2024  END OF SESSION:  PT End of Session - 02/06/24 0916     Visit Number 2    Number of Visits 9    Date for Recertification  03/18/24    Authorization Type [employer] Dual Complete    Authorization - Visit Number 2    Progress Note Due on Visit 10    PT Start Time 0916    PT Stop Time 0954    PT Time Calculation (min) 38 min    Activity Tolerance Patient tolerated treatment well    Behavior During Therapy [facility] for tasks assessed/performed           Past Medical History:  Diagnosis Date   Abnormal mammogram 08/28/2014   08/28/14 - Probable benign microcalcifications over the outer mid to lower left breast. Bi-Rads 3. F/U 6 months    Allergic rhinitis 06/22/2014   Arm pain, lateral, left 04/28/2016   Asthma    Breast cancer (HCC) 03/16/2022   Carpal tunnel syndrome 04/26/2006   Qualifier: Diagnosis of  By: Benjamine MD, Aaron     Femur fracture [facility]) 05/14/2021   Femur fracture, right (HCC) 05/14/2021   First degree AV block 09/22/2013   EKG 09/22/13    GANGLION CYST, WRIST, RIGHT 10/06/2008   Qualifier: Diagnosis of  By: Lucille  MD, Taineisha     Hypothyroidism    Imbalance 01/12/2017   Palpitations 09/22/2013   Right wrist pain 04/28/2016   Past Surgical History:  Procedure Laterality Date   ABDOMINAL HYSTERECTOMY     fibroids   BREAST BIOPSY Left 01/24/2022   MM LT BREAST BX W LOC DEV 1ST LESION IMAGE BX SPEC STEREO GUIDE 01/24/2022 [facility]   BREAST BIOPSY  03/15/2022   MM LT RADIOACTIVE SEED LOC MAMMO GUIDE 03/15/2022 [facility]   BREAST LUMPECTOMY WITH RADIOACTIVE SEED LOCALIZATION Left 03/16/2022   Procedure: LEFT BREAST LUMPECTOMY WITH RADIOACTIVE SEED LOCALIZATION;  Surgeon: Vanderbilt Ned, MD;  Location: [facility];  Service: General;  Laterality: Left;   COLONOSCOPY N/A  10/04/2023   Procedure: COLONOSCOPY;  Surgeon: Stacia Glendia BRAVO, MD;  Location: [facility];  Service: Gastroenterology;  Laterality: N/A;   FEMUR IM NAIL Right 05/14/2021   Procedure: INTRAMEDULLARY (IM) RETROGRADE FEMORAL NAILING;  Surgeon: Beuford Anes, MD;  Location: [facility];  Service: Orthopedics;  Laterality: Right;   Patient Active Problem List   Diagnosis Date Noted   Family history of colon cancer 10/04/2023   Benign neoplasm of colon 10/04/2023   Lumbar radiculopathy 06/29/2023   CKD stage 3a, GFR 45-59 ml/min (HCC) 06/29/2023   Elevated liver enzymes 05/04/2023   Hip pain 04/17/2023   Insomnia 02/06/2023   Colon cancer screening 06/19/2022   Skin desquamation 06/14/2022   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   Normocytic anemia 07/12/2021   History of femur fracture 07/11/2021   Seasonal allergies 04/08/2021   Restrictive lung disease 03/25/2021   Prediabetes 03/25/2021   Thoracic aortic ectasia 09/07/2020   Congestive heart failure (HCC) 08/03/2020   Esophageal reflux 08/14/2017   Vitamin D  deficiency 01/12/2017   Cataract 09/19/2013   Hypothyroidism 04/26/2006   Hyperlipidemia 04/26/2006   BMI 45.0-49.9, adult (HCC) 04/26/2006   Major depressive disorder, recurrent episode 04/26/2006   HYPERTENSION, BENIGN SYSTEMIC 04/26/2006    PCP: Anders Otto DASEN, MD  REFERRING PROVIDER: Anders Otto DASEN, MD  REFERRING DIAG:  M54.16 (ICD-10-CM) - Lumbar radiculopathy    Rationale for Evaluation and Treatment: Rehabilitation  THERAPY DIAG:  Other low back pain  Pain in left foot  Muscle weakness (generalized)  ONSET DATE: 2-3 years, episodic since injection  SUBJECTIVE:                                                                                                                                                                                           SUBJECTIVE STATEMENT: Pt states that she has been mostly compliant with HEP and has been  able to note improvement in symptoms. Reports numbness in L foot, no low back pain present.   Pt states that she had 1 month of relief following epidural injection. Has had low back pain x2-3 years which has been overall worsening, had PT from May-September 2025 with good outcomes, symptoms returned prompting follow up with phys med and rehab. Pt reports numbness in L foot, no symptoms in RLE. Pt denies LLE symptoms other than the foot. Pain increases with activity or position, independent of time of day. Pt not currently completing exercises. Pt reports that symptoms will range in intensity from 0/10-7/10. Symptoms described as sharp, dull in the low back, numb in L foot, sometimes painful, starts okay then becomes numb, and then painful.   PERTINENT HISTORY:  Pt had epidural steroid injection 12/06/23 in the lumbar spine at L5 with Gardiner Masters, MD at [facility]. PCP placed referral for PT.   PAIN:  Are you having pain? Yes: NPRS scale: 0/10 Pain location: L foot Pain description: numb Aggravating factors: moving, weight bearing Relieving factors: laying down  PRECAUTIONS: None  RED FLAGS: Bowel or bladder incontinence: No and Cauda equina syndrome: No   WEIGHT BEARING RESTRICTIONS: No  FALLS:  Has patient fallen in last 6 months? Yes. Number of falls 1  LIVING ENVIRONMENT: Lives with: grandson stays with her since she has been having more trouble physically and medically, 68 years old- helps with grocery, upkeep, cooking Lives in: House/apartment Stairs: Yes: External: 3 steps; on left going up Has following equipment at home: None  OCCUPATION: retired custodial and concessions associate  PLOF: Independent  PATIENT GOALS: move around without pain or numbness   NEXT MD VISIT: PCP Feb 2026  OBJECTIVE:  Note: Objective measures were completed at Evaluation unless otherwise noted.  DIAGNOSTIC FINDINGS:  IMPRESSION: 06/05/23 1. Grade 1 spondylolisthesis at  L5-S1 with disc and endplate degeneration, up to severe facet degeneration. No spinal or lateral recess stenosis. Up to moderate foraminal stenosis. Query L5 radiculitis. 2. No other significant lumbar disc degeneration. Intermittent facet hypertrophy. Capacious spinal  canal. 3. Cholelithiasis.  PATIENT SURVEYS:  Modified Oswestry:  MODIFIED OSWESTRY DISABILITY SCALE  Date: 01/22/24 Score  Pain intensity 1 = The pain is bad, but I can manage without having to take pain medication  2. Personal care (washing, dressing, etc.) 1 =  I can take care of myself normally, but it increases my pain.  3. Lifting 5 =  I cannot lift or carry anything at all.  4. Walking 2 =  Pain prevents me from walking more than  mile.  5. Sitting 3 =  Pain prevents me from sitting more than  hour.  6. Standing 4 =  Pain prevents me from standing more than 10 minutes.  7. Sleeping 0 = Pain does not prevent me from sleeping well.  8. Social Life 4 =  Pain has restricted my social life to my home  9. Traveling 4 = My pain restricts my travel to short necessary journeys under 1/2 hour.  10. Employment/ Homemaking 5 = Pain prevents me from performing any job or homemaking chores.  Total 29/50   Interpretation of scores: Score Category Description  0-20% Minimal Disability The patient can cope with most living activities. Usually no treatment is indicated apart from advice on lifting, sitting and exercise  21-40% Moderate Disability The patient experiences more pain and difficulty with sitting, lifting and standing. Travel and social life are more difficult and they may be disabled from work. Personal care, sexual activity and sleeping are not grossly affected, and the patient can usually be managed by conservative means  41-60% Severe Disability Pain remains the main problem in this group, but activities of daily living are affected. These patients require a detailed investigation  61-80% Crippled Back pain impinges  on all aspects of the patients life. Positive intervention is required  81-100% Bed-bound These patients are either bed-bound or exaggerating their symptoms  Bluford FORBES Zoe DELENA Karon DELENA, et al. Surgery versus conservative management of stable thoracolumbar fracture: the PRESTO feasibility RCT. [place] ([place]): [employer]; 2021 Nov. [facility] Technology Assessment, No. 25.62.) Appendix 3, Oswestry Disability Index category descriptors. Available from: [email or web address].cz  Minimally Clinically Important Difference (MCID) = 12.8%  COGNITION: Overall cognitive status: Within functional limits for tasks assessed     SENSATION: S1 dermatome on LLE noted tingling   POSTURE: rounded shoulders, forward head, increased lumbar lordosis, and increased thoracic kyphosis  PALPATION: Slight inc in resting tension of paraspinals and lat muscles bilaterally, no TTP noted  LUMBAR ROM:   AROM eval 02/06/24  Flexion Nil loss, notes some dizziness Nil loss, no symptoms of dizziness or pain   Extension 25% loss, no symptoms 25% loss. No symptoms  Right lateral flexion Nil loss, no symptoms Nil loss, no sx  Left lateral flexion Nil loss, L sided symptoms to the knee Nil loss, no sx  Right rotation    Left rotation     (Blank rows = not tested)  LOWER EXTREMITY ROM:   WFL  Active  Right eval Left eval  Hip flexion    Hip extension    Hip abduction    Hip adduction    Hip internal rotation    Hip external rotation    Knee flexion    Knee extension    Ankle dorsiflexion    Ankle plantarflexion    Ankle inversion    Ankle eversion     (Blank rows = not tested)  LOWER EXTREMITY MMT:    MMT Right eval Left eval  Hip  flexion 4+/5 3+/5  Hip extension 4/5 4-/5  Hip abduction 4-/5 3+/5  Hip adduction 4+/5 4+/5  Hip internal rotation    Hip external rotation    Knee flexion 4/5 4-/5  Knee extension 4+/5 4/5  Ankle dorsiflexion 5/5 5/5  Ankle  plantarflexion 4+/5 4+/5  Ankle inversion    Ankle eversion     (Blank rows = not tested)   GAIT: Distance walked: lobby to treatment area Assistive device utilized: None Level of assistance: Complete Independence Comments: inc weight shift bilaterally, reciprocal steps, dec LLE WB tolerance with slightly shortened RLE stride, no LOB noted  TREATMENT DATE:   [facility] Adult PT Treatment:                                                DATE: 02/06/24 Therapeutic Exercise: W stretch with physioball x15 cycles Seated physioball press x30, 3-5 sec hold Piriformis stretch in supine 3x30s BLE Bilateral knee sway (trunk rotation) in supine hook lying x15 bilaterally Bridging x15, 3 sec hold in hook lying    [facility] Adult PT Treatment:                                                DATE: 01/22/24  Self Care: HEP developed and provided. Pt able to demonstrate correct form and technique, denies further questions Pt edu                                                                                                                                  PATIENT EDUCATION:  Education details: Pt educated on relevant anatomy, physiology, pathology, diagnosis, prognosis, progression of care, pain and activity modification related to low back pain Person educated: Patient Education method: Explanation, Demonstration, and Handouts Education comprehension: verbalized understanding and returned demonstration  HOME EXERCISE PROGRAM: Access Code: [email or web address] URL: [email or web address] Date: 02/06/2024 Prepared by: Stann Ohara  Exercises - Slouch Overcorrect on Swiss Ball  - 1 x daily - 4 x weekly - 3 sets - 10 reps - 2 hold - Supine Sciatic Nerve Glide  - 1 x daily - 4 x weekly - 2 sets - 15 reps - 2 hold - Supine Piriformis Stretch with Towel  - 1 x daily - 7 x weekly - 3 sets - 10 reps - 2 hold - Supine Lower Trunk Rotation  - 1 x daily - 4 x weekly - 1 sets - 15 reps - 2 hold - Supine  Bridge  - 1 x daily - 4 x weekly - 1 sets - 15 reps - 3 hold  ASSESSMENT:  CLINICAL IMPRESSION: Pt has been responding well to her current regimen and reports symptoms  are intermittent and improving overall. She is able to progress strength and stability with elongation to lower quarter to improve functional movements with decreased strain on joints and neural structures.   EVAL: Patient is a 68 y.o. F who was seen today for physical therapy evaluation and treatment for low back pain. Pt has presence of intermittent L foot pain as well. Symptoms and presentation consistent with lumbar impingement which contributes to inc pain and numbness in L foot, LLE weakness, and low back pain. Inc neural tension in LLE and decreased tolerance to closing moment in the lumbar spine on the left side. Pt to initiate HEP of nerve glides and mid range lumbar mechanics in sitting to progress activity tolerance. Pt stands to benefit from continued skilled physical therapy to address deficit areas and restore safety with activities and participations at home and in the community.     OBJECTIVE IMPAIRMENTS: Abnormal gait, decreased activity tolerance, decreased endurance, decreased knowledge of condition, decreased mobility, difficulty walking, decreased ROM, decreased strength, dizziness, impaired perceived functional ability, impaired sensation, obesity, and pain.   ACTIVITY LIMITATIONS: carrying, lifting, bending, sitting, standing, and squatting  PARTICIPATION LIMITATIONS: meal prep, cleaning, laundry, driving, and community activity  PERSONAL FACTORS: Age, Fitness, Past/current experiences, Time since onset of injury/illness/exacerbation, and 3+ comorbidities: restrictive lung disease, DCIS of breast, R hip fx, CKD are also affecting patient's functional outcome.   REHAB POTENTIAL: Good  CLINICAL DECISION MAKING: Stable/uncomplicated  EVALUATION COMPLEXITY: Low   GOALS: Goals reviewed with patient?  Yes  SHORT TERM GOALS: Target date: 02/22/24   Pt will report compliance with HEP to work towards ind and home management strategies Baseline: Goal status: INITIAL   2.  Pt will score no greater than 19/50 on ODI to demonstrate improved activity tolerance Baseline: 29/50 Goal status: INITIAL   3.  Pt will improve lumbar spine ROM to full and painless in order to demonstrate progress towards activity tolerance and improved function Baseline: see ROM chart Goal status: INITIAL   4.  Pt will report no greater than 4/10 pain over 5 consecutive days to demonstrate progress towards maintained reduction of symptoms Baseline: 0/10- 7/10 Goal status: INITIAL      LONG TERM GOALS: Target date: 03/18/24   Pt will score no greater than 9/50 on ODI to demonstrate improved activity tolerance Baseline:  Goal status: INITIAL   2.  Pt will report no greater than 1/10 pain over 7 consecutive days to demonstrate maintained reduction in symptoms and improved tolerance to activity Baseline: 0/10-7/10 Goal status: INITIAL   3.  Pt will be ind in the management of their symptoms at home and in the community Baseline:  Goal status: INITIAL     PLAN:  PT FREQUENCY: 1-2x/week  PT DURATION: 8 weeks  PLANNED INTERVENTIONS: 97110-Therapeutic exercises, 97530- Therapeutic activity, [id number]- Neuromuscular re-education, 97535- Self Care, 02859- Manual therapy, [id number]- Gait training, [phone]- Aquatic Therapy, [id number]- Electrical stimulation (unattended), 97016- Vasopneumatic device, 20560 (1-2 muscles), 20561 (3+ muscles)- Dry Needling, Patient/Family education, Cryotherapy, and Moist heat.  PLAN FOR NEXT SESSION: progress core strength and stability, hip strengthening, endurance, modify HEP as indicated   Stann DELENA Ohara, PT 02/06/2024, 9:55 AM

## 2024-02-11 NOTE — Telephone Encounter (Signed)
 Spoke w patient regarding 03/31/24 appt being rescheduled to 04/01/24. Patient is aware of new appt date and time.

## 2024-02-13 ENCOUNTER — Telehealth: Payer: Self-pay | Admitting: Internal Medicine

## 2024-02-13 ENCOUNTER — Ambulatory Visit

## 2024-02-13 DIAGNOSIS — M5459 Other low back pain: Secondary | ICD-10-CM | POA: Diagnosis not present

## 2024-02-13 DIAGNOSIS — R2689 Other abnormalities of gait and mobility: Secondary | ICD-10-CM

## 2024-02-13 DIAGNOSIS — M79672 Pain in left foot: Secondary | ICD-10-CM

## 2024-02-13 DIAGNOSIS — M6281 Muscle weakness (generalized): Secondary | ICD-10-CM

## 2024-02-13 NOTE — Telephone Encounter (Signed)
 Pt c/o medication issue:  1. Name of Medication:   Evolocumab  (REPATHA  SURECLICK) 140 MG/ML SOAJ   2. How are you currently taking this medication (dosage and times per day)?   As prescribed  3. Are you having a reaction (difficulty breathing--STAT)?   No  4. What is your medication issue?   Patient wants to know if she needs to continue on this medication.  Patient noted she is now out of this medication and will need a refill from the program.

## 2024-02-13 NOTE — Therapy (Signed)
 OUTPATIENT PHYSICAL THERAPY THORACOLUMBAR TREATMENT   Patient Name: Andrea [name] MRN: [id number] DOB:May 09, 1955, 68 y.o., female Today's Date: 02/13/2024  END OF SESSION:  PT End of Session - 02/13/24 0916     Visit Number 3    Number of Visits 9    Date for Recertification  03/18/24    Authorization Type [employer] Dual Complete    Progress Note Due on Visit 10    PT Start Time 0915    PT Stop Time 0956    PT Time Calculation (min) 41 min    Activity Tolerance Patient tolerated treatment well    Behavior During Therapy [facility] for tasks assessed/performed          Past Medical History:  Diagnosis Date   Abnormal mammogram 08/28/2014   08/28/14 - Probable benign microcalcifications over the outer mid to lower left breast. Bi-Rads 3. F/U 6 months    Allergic rhinitis 06/22/2014   Arm pain, lateral, left 04/28/2016   Asthma    Breast cancer (HCC) 03/16/2022   Carpal tunnel syndrome 04/26/2006   Qualifier: Diagnosis of  By: Benjamine MD, Aaron     Femur fracture [facility]) 05/14/2021   Femur fracture, right (HCC) 05/14/2021   First degree AV block 09/22/2013   EKG 09/22/13    GANGLION CYST, WRIST, RIGHT 10/06/2008   Qualifier: Diagnosis of  By: Lucille  MD, Taineisha     Hypothyroidism    Imbalance 01/12/2017   Palpitations 09/22/2013   Right wrist pain 04/28/2016   Past Surgical History:  Procedure Laterality Date   ABDOMINAL HYSTERECTOMY     fibroids   BREAST BIOPSY Left 01/24/2022   MM LT BREAST BX W LOC DEV 1ST LESION IMAGE BX SPEC STEREO GUIDE 01/24/2022 [facility]   BREAST BIOPSY  03/15/2022   MM LT RADIOACTIVE SEED LOC MAMMO GUIDE 03/15/2022 [facility]   BREAST LUMPECTOMY WITH RADIOACTIVE SEED LOCALIZATION Left 03/16/2022   Procedure: LEFT BREAST LUMPECTOMY WITH RADIOACTIVE SEED LOCALIZATION;  Surgeon: Vanderbilt Ned, MD;  Location: [facility];  Service: General;  Laterality: Left;   COLONOSCOPY N/A 10/04/2023   Procedure: COLONOSCOPY;   Surgeon: Stacia Glendia BRAVO, MD;  Location: [facility];  Service: Gastroenterology;  Laterality: N/A;   FEMUR IM NAIL Right 05/14/2021   Procedure: INTRAMEDULLARY (IM) RETROGRADE FEMORAL NAILING;  Surgeon: Beuford Anes, MD;  Location: [facility];  Service: Orthopedics;  Laterality: Right;   Patient Active Problem List   Diagnosis Date Noted   Family history of colon cancer 10/04/2023   Benign neoplasm of colon 10/04/2023   Lumbar radiculopathy 06/29/2023   CKD stage 3a, GFR 45-59 ml/min (HCC) 06/29/2023   Elevated liver enzymes 05/04/2023   Hip pain 04/17/2023   Insomnia 02/06/2023   Colon cancer screening 06/19/2022   Skin desquamation 06/14/2022   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   Normocytic anemia 07/12/2021   History of femur fracture 07/11/2021   Seasonal allergies 04/08/2021   Restrictive lung disease 03/25/2021   Prediabetes 03/25/2021   Thoracic aortic ectasia 09/07/2020   Congestive heart failure (HCC) 08/03/2020   Esophageal reflux 08/14/2017   Vitamin D  deficiency 01/12/2017   Cataract 09/19/2013   Hypothyroidism 04/26/2006   Hyperlipidemia 04/26/2006   BMI 45.0-49.9, adult (HCC) 04/26/2006   Major depressive disorder, recurrent episode 04/26/2006   HYPERTENSION, BENIGN SYSTEMIC 04/26/2006    PCP: Anders Otto DASEN, MD  REFERRING PROVIDER: Anders Otto DASEN, MD  REFERRING DIAG:  M54.16 (ICD-10-CM) - Lumbar radiculopathy  Rationale for Evaluation and Treatment: Rehabilitation  THERAPY DIAG:  Other low back pain  Muscle weakness (generalized)  Pain in left foot  Other abnormalities of gait and mobility  ONSET DATE: 2-3 years, episodic since injection  SUBJECTIVE:                                                                                                                                                                                           SUBJECTIVE STATEMENT: Patient reports no current back pain, has numbness and  tingling in BIL feet.   EVAL: Pt states that she had 1 month of relief following epidural injection. Has had low back pain x2-3 years which has been overall worsening, had PT from May-September 2025 with good outcomes, symptoms returned prompting follow up with phys med and rehab. Pt reports numbness in L foot, no symptoms in RLE. Pt denies LLE symptoms other than the foot. Pain increases with activity or position, independent of time of day. Pt not currently completing exercises. Pt reports that symptoms will range in intensity from 0/10-7/10. Symptoms described as sharp, dull in the low back, numb in L foot, sometimes painful, starts okay then becomes numb, and then painful.   PERTINENT HISTORY:  Pt had epidural steroid injection 12/06/23 in the lumbar spine at L5 with Gardiner Masters, MD at [facility]. PCP placed referral for PT.   PAIN:  Are you having pain? Yes: NPRS scale: 0/10 Pain location: L foot Pain description: numb Aggravating factors: moving, weight bearing Relieving factors: laying down  PRECAUTIONS: None  RED FLAGS: Bowel or bladder incontinence: No and Cauda equina syndrome: No   WEIGHT BEARING RESTRICTIONS: No  FALLS:  Has patient fallen in last 6 months? Yes. Number of falls 1  LIVING ENVIRONMENT: Lives with: grandson stays with her since she has been having more trouble physically and medically, 68 years old- helps with grocery, upkeep, cooking Lives in: House/apartment Stairs: Yes: External: 3 steps; on left going up Has following equipment at home: None  OCCUPATION: retired custodial and concessions associate  PLOF: Independent  PATIENT GOALS: move around without pain or numbness   NEXT MD VISIT: PCP Feb 2026  OBJECTIVE:  Note: Objective measures were completed at Evaluation unless otherwise noted.  DIAGNOSTIC FINDINGS:  IMPRESSION: 06/05/23 1. Grade 1 spondylolisthesis at L5-S1 with disc and endplate degeneration, up to severe facet  degeneration. No spinal or lateral recess stenosis. Up to moderate foraminal stenosis. Query L5 radiculitis. 2. No other significant lumbar disc degeneration. Intermittent facet hypertrophy. Capacious spinal canal. 3. Cholelithiasis.  PATIENT SURVEYS:  Modified Oswestry:  MODIFIED OSWESTRY DISABILITY SCALE  Date:  01/22/24 Score  Pain intensity 1 = The pain is bad, but I can manage without having to take pain medication  2. Personal care (washing, dressing, etc.) 1 =  I can take care of myself normally, but it increases my pain.  3. Lifting 5 =  I cannot lift or carry anything at all.  4. Walking 2 =  Pain prevents me from walking more than  mile.  5. Sitting 3 =  Pain prevents me from sitting more than  hour.  6. Standing 4 =  Pain prevents me from standing more than 10 minutes.  7. Sleeping 0 = Pain does not prevent me from sleeping well.  8. Social Life 4 =  Pain has restricted my social life to my home  9. Traveling 4 = My pain restricts my travel to short necessary journeys under 1/2 hour.  10. Employment/ Homemaking 5 = Pain prevents me from performing any job or homemaking chores.  Total 29/50   Interpretation of scores: Score Category Description  0-20% Minimal Disability The patient can cope with most living activities. Usually no treatment is indicated apart from advice on lifting, sitting and exercise  21-40% Moderate Disability The patient experiences more pain and difficulty with sitting, lifting and standing. Travel and social life are more difficult and they may be disabled from work. Personal care, sexual activity and sleeping are not grossly affected, and the patient can usually be managed by conservative means  41-60% Severe Disability Pain remains the main problem in this group, but activities of daily living are affected. These patients require a detailed investigation  61-80% Crippled Back pain impinges on all aspects of the patients life. Positive intervention is  required  81-100% Bed-bound These patients are either bed-bound or exaggerating their symptoms  Bluford FORBES Zoe DELENA Karon DELENA, et al. Surgery versus conservative management of stable thoracolumbar fracture: the PRESTO feasibility RCT. [place] ([place]): [employer]; 2021 Nov. [facility] Technology Assessment, No. 25.62.) Appendix 3, Oswestry Disability Index category descriptors. Available from: Findjewelers.cz  Minimally Clinically Important Difference (MCID) = 12.8%  COGNITION: Overall cognitive status: Within functional limits for tasks assessed     SENSATION: S1 dermatome on LLE noted tingling   POSTURE: rounded shoulders, forward head, increased lumbar lordosis, and increased thoracic kyphosis  PALPATION: Slight inc in resting tension of paraspinals and lat muscles bilaterally, no TTP noted  LUMBAR ROM:   AROM eval 02/06/24  Flexion Nil loss, notes some dizziness Nil loss, no symptoms of dizziness or pain   Extension 25% loss, no symptoms 25% loss. No symptoms  Right lateral flexion Nil loss, no symptoms Nil loss, no sx  Left lateral flexion Nil loss, L sided symptoms to the knee Nil loss, no sx  Right rotation    Left rotation     (Blank rows = not tested)  LOWER EXTREMITY ROM:   WFL  Active  Right eval Left eval  Hip flexion    Hip extension    Hip abduction    Hip adduction    Hip internal rotation    Hip external rotation    Knee flexion    Knee extension    Ankle dorsiflexion    Ankle plantarflexion    Ankle inversion    Ankle eversion     (Blank rows = not tested)  LOWER EXTREMITY MMT:    MMT Right eval Left eval  Hip flexion 4+/5 3+/5  Hip extension 4/5 4-/5  Hip abduction 4-/5 3+/5  Hip adduction  4+/5 4+/5  Hip internal rotation    Hip external rotation    Knee flexion 4/5 4-/5  Knee extension 4+/5 4/5  Ankle dorsiflexion 5/5 5/5  Ankle plantarflexion 4+/5 4+/5  Ankle inversion    Ankle eversion      (Blank rows = not tested)   GAIT: Distance walked: lobby to treatment area Assistive device utilized: None Level of assistance: Complete Independence Comments: inc weight shift bilaterally, reciprocal steps, dec LLE WB tolerance with slightly shortened RLE stride, no LOB noted  TREATMENT DATE:  [facility] Adult PT Treatment:                                                DATE: 02/13/24 Therapeutic Exercise: Nustep level 5 x 6 mins W stretch with physioball x10 cycles Supine physioball press 2x10, 3-5 sec hold Piriformis stretch in supine 3x30s BLE Bilateral knee sway (trunk rotation) in supine hook lying x15 bilaterally Bridging x15, 3 sec hold in hook lying Standing heel raises 2x10   [facility] Adult PT Treatment:                                                DATE: 02/06/24 Therapeutic Exercise: W stretch with physioball x15 cycles Seated physioball press x30, 3-5 sec hold Piriformis stretch in supine 3x30s BLE Bilateral knee sway (trunk rotation) in supine hook lying x15 bilaterally Bridging x15, 3 sec hold in hook lying   [facility] Adult PT Treatment:                                                DATE: 01/22/24  Self Care: HEP developed and provided. Pt able to demonstrate correct form and technique, denies further questions Pt edu                              PATIENT EDUCATION:  Education details: Pt educated on relevant anatomy, physiology, pathology, diagnosis, prognosis, progression of care, pain and activity modification related to low back pain Person educated: Patient Education method: Explanation, Demonstration, and Handouts Education comprehension: verbalized understanding and returned demonstration  HOME EXERCISE PROGRAM: Access Code: [email or web address] URL: [email or web address] Date: 02/06/2024 Prepared by: Stann Ohara  Exercises - Slouch Overcorrect on Swiss Ball  - 1 x daily - 4 x weekly - 3 sets - 10 reps - 2 hold - Supine Sciatic Nerve Glide  - 1 x daily  - 4 x weekly - 2 sets - 15 reps - 2 hold - Supine Piriformis Stretch with Towel  - 1 x daily - 7 x weekly - 3 sets - 10 reps - 2 hold - Supine Lower Trunk Rotation  - 1 x daily - 4 x weekly - 1 sets - 15 reps - 2 hold - Supine Bridge  - 1 x daily - 4 x weekly - 1 sets - 15 reps - 3 hold  ASSESSMENT:  CLINICAL IMPRESSION: Patient presents to PT reporting no current low back pain and that she continues to have N/T in BIL feet along plantar surface. Session today  continued to focus on hip and core strengthening as well as lumbar mobility. Incorporated more standing exercises today to being improving standing activity tolerance, no pain reported, only muscular fatigue. Patient continues to benefit from skilled PT services and should be progressed as able to improve functional independence.   EVAL: Patient is a 68 y.o. F who was seen today for physical therapy evaluation and treatment for low back pain. Pt has presence of intermittent L foot pain as well. Symptoms and presentation consistent with lumbar impingement which contributes to inc pain and numbness in L foot, LLE weakness, and low back pain. Inc neural tension in LLE and decreased tolerance to closing moment in the lumbar spine on the left side. Pt to initiate HEP of nerve glides and mid range lumbar mechanics in sitting to progress activity tolerance. Pt stands to benefit from continued skilled physical therapy to address deficit areas and restore safety with activities and participations at home and in the community.     OBJECTIVE IMPAIRMENTS: Abnormal gait, decreased activity tolerance, decreased endurance, decreased knowledge of condition, decreased mobility, difficulty walking, decreased ROM, decreased strength, dizziness, impaired perceived functional ability, impaired sensation, obesity, and pain.   ACTIVITY LIMITATIONS: carrying, lifting, bending, sitting, standing, and squatting  PARTICIPATION LIMITATIONS: meal prep, cleaning, laundry,  driving, and community activity  PERSONAL FACTORS: Age, Fitness, Past/current experiences, Time since onset of injury/illness/exacerbation, and 3+ comorbidities: restrictive lung disease, DCIS of breast, R hip fx, CKD are also affecting patient's functional outcome.   REHAB POTENTIAL: Good  CLINICAL DECISION MAKING: Stable/uncomplicated  EVALUATION COMPLEXITY: Low   GOALS: Goals reviewed with patient? Yes  SHORT TERM GOALS: Target date: 02/22/24   Pt will report compliance with HEP to work towards ind and home management strategies Baseline: Goal status: INITIAL   2.  Pt will score no greater than 19/50 on ODI to demonstrate improved activity tolerance Baseline: 29/50 Goal status: INITIAL   3.  Pt will improve lumbar spine ROM to full and painless in order to demonstrate progress towards activity tolerance and improved function Baseline: see ROM chart Goal status: INITIAL   4.  Pt will report no greater than 4/10 pain over 5 consecutive days to demonstrate progress towards maintained reduction of symptoms Baseline: 0/10- 7/10 Goal status: INITIAL      LONG TERM GOALS: Target date: 03/18/24   Pt will score no greater than 9/50 on ODI to demonstrate improved activity tolerance Baseline:  Goal status: INITIAL   2.  Pt will report no greater than 1/10 pain over 7 consecutive days to demonstrate maintained reduction in symptoms and improved tolerance to activity Baseline: 0/10-7/10 Goal status: INITIAL   3.  Pt will be ind in the management of their symptoms at home and in the community Baseline:  Goal status: INITIAL     PLAN:  PT FREQUENCY: 1-2x/week  PT DURATION: 8 weeks  PLANNED INTERVENTIONS: 97110-Therapeutic exercises, 97530- Therapeutic activity, [id number]- Neuromuscular re-education, 97535- Self Care, [id number]- Manual therapy, [id number]- Gait training, [phone]- Aquatic Therapy, [phone]- Electrical stimulation (unattended), [id number]- Vasopneumatic device, 20560 (1-2 muscles),  20561 (3+ muscles)- Dry Needling, Patient/Family education, Cryotherapy, and Moist heat.  PLAN FOR NEXT SESSION: progress core strength and stability, hip strengthening, endurance, modify HEP as indicated   Corean Pouch, PTA 02/13/2024, 9:59 AM

## 2024-02-13 NOTE — Telephone Encounter (Signed)
 She was sent a year supply 2 months ago, should not need a refill

## 2024-02-13 NOTE — Telephone Encounter (Signed)
 Called to let pt know that repatha  script had already been sent in for a year supply and she has refills available.

## 2024-02-13 NOTE — Telephone Encounter (Signed)
 Spoke with patient in regards to Repatha . Pt inquiring about whether she needs to continue taking this medication. Will send to pharmacy and Dr. Mona for clarification. Pt verbalized understanding.

## 2024-02-19 ENCOUNTER — Ambulatory Visit

## 2024-03-03 MED ORDER — EZETIMIBE 10 MG PO TABS: 10.0000 mg | ORAL_TABLET | Freq: Every day | ORAL | 3 refills | Status: AC

## 2024-03-04 ENCOUNTER — Ambulatory Visit: Admitting: Physical Therapy

## 2024-03-06 ENCOUNTER — Ambulatory Visit: Admitting: Physical Therapy

## 2024-03-31 ENCOUNTER — Ambulatory Visit: Admitting: Hematology and Oncology

## 2024-04-01 ENCOUNTER — Inpatient Hospital Stay: Admitting: Hematology and Oncology

## 2024-04-01 ENCOUNTER — Telehealth: Payer: Self-pay | Admitting: Hematology and Oncology

## 2024-04-08 ENCOUNTER — Inpatient Hospital Stay: Admitting: Hematology and Oncology

## 2024-05-26 ENCOUNTER — Other Ambulatory Visit (HOSPITAL_BASED_OUTPATIENT_CLINIC_OR_DEPARTMENT_OTHER)

## 2024-07-28 ENCOUNTER — Encounter
